# Patient Record
Sex: Female | Born: 1945
Health system: Southern US, Community
[De-identification: ages and names within clinical notes are randomized; demographics above are authoritative.]

## PROBLEM LIST (undated history)

## (undated) DIAGNOSIS — J449 Chronic obstructive pulmonary disease, unspecified: Secondary | ICD-10-CM

## (undated) DIAGNOSIS — E785 Hyperlipidemia, unspecified: Secondary | ICD-10-CM

## (undated) DIAGNOSIS — J439 Emphysema, unspecified: Secondary | ICD-10-CM

## (undated) DIAGNOSIS — J310 Chronic rhinitis: Secondary | ICD-10-CM

## (undated) DIAGNOSIS — J309 Allergic rhinitis, unspecified: Secondary | ICD-10-CM

## (undated) DIAGNOSIS — C801 Malignant (primary) neoplasm, unspecified: Secondary | ICD-10-CM

## (undated) DIAGNOSIS — D126 Benign neoplasm of colon, unspecified: Secondary | ICD-10-CM

## (undated) DIAGNOSIS — M1990 Unspecified osteoarthritis, unspecified site: Secondary | ICD-10-CM

## (undated) DIAGNOSIS — F419 Anxiety disorder, unspecified: Secondary | ICD-10-CM

## (undated) DIAGNOSIS — J9610 Chronic respiratory failure, unspecified whether with hypoxia or hypercapnia: Secondary | ICD-10-CM

## (undated) DIAGNOSIS — J342 Deviated nasal septum: Secondary | ICD-10-CM

## (undated) DIAGNOSIS — F329 Major depressive disorder, single episode, unspecified: Secondary | ICD-10-CM

## (undated) DIAGNOSIS — R0902 Hypoxemia: Secondary | ICD-10-CM

## (undated) DIAGNOSIS — M8580 Other specified disorders of bone density and structure, unspecified site: Secondary | ICD-10-CM

## (undated) DIAGNOSIS — T7840XA Allergy, unspecified, initial encounter: Secondary | ICD-10-CM

## (undated) HISTORY — DX: Hyperlipidemia, unspecified: E78.5

## (undated) HISTORY — DX: Allergic rhinitis, unspecified: J30.9

## (undated) HISTORY — DX: Other specified disorders of bone density and structure, unspecified site: M85.80

## (undated) HISTORY — PX: LIPOMA EXCISION: SHX5283

## (undated) HISTORY — DX: Emphysema, unspecified: J43.9

## (undated) HISTORY — DX: Allergy, unspecified, initial encounter: T78.40XA

## (undated) HISTORY — PX: COLON SURGERY: SHX602

## (undated) HISTORY — PX: COSMETIC SURGERY: SHX468

## (undated) HISTORY — DX: Major depressive disorder, single episode, unspecified: F32.9

## (undated) HISTORY — DX: Deviated nasal septum: J34.2

## (undated) HISTORY — PX: HERNIA REPAIR: SHX51

## (undated) HISTORY — PX: BREAST SURGERY: SHX581

## (undated) HISTORY — DX: Unspecified osteoarthritis, unspecified site: M19.90

## (undated) HISTORY — DX: Benign neoplasm of colon, unspecified: D12.6

## (undated) HISTORY — DX: Chronic respiratory failure, unspecified whether with hypoxia or hypercapnia: J96.10

## (undated) HISTORY — DX: Malignant (primary) neoplasm, unspecified: C80.1

## (undated) HISTORY — PX: GANGLION CYST EXCISION: SHX1691

## (undated) HISTORY — DX: Anxiety disorder, unspecified: F41.9

## (undated) HISTORY — DX: Hypoxemia: R09.02

## (undated) HISTORY — DX: Chronic rhinitis: J31.0

## (undated) SURGERY — VIDEO BRONCHOSCOPY WITHOUT FLUORO
Anesthesia: Moderate Sedation

---
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
## 1976-06-10 HISTORY — PX: BREAST ENHANCEMENT SURGERY: SHX7

## 1985-06-10 HISTORY — PX: NASAL SEPTUM SURGERY: SHX37

## 1999-11-01 ENCOUNTER — Other Ambulatory Visit: Admission: RE | Admit: 1999-11-01 | Discharge: 1999-11-01 | Payer: Self-pay | Admitting: Gastroenterology

## 1999-11-01 ENCOUNTER — Encounter (INDEPENDENT_AMBULATORY_CARE_PROVIDER_SITE_OTHER): Payer: Self-pay | Admitting: Specialist

## 2000-05-19 ENCOUNTER — Emergency Department (HOSPITAL_COMMUNITY): Admission: EM | Admit: 2000-05-19 | Discharge: 2000-05-19 | Payer: Self-pay | Admitting: *Deleted

## 2000-05-19 ENCOUNTER — Encounter: Payer: Self-pay | Admitting: Emergency Medicine

## 2001-09-23 ENCOUNTER — Other Ambulatory Visit: Admission: RE | Admit: 2001-09-23 | Discharge: 2001-09-23 | Payer: Self-pay | Admitting: *Deleted

## 2002-04-22 ENCOUNTER — Encounter: Admission: RE | Admit: 2002-04-22 | Discharge: 2002-04-22 | Payer: Self-pay

## 2002-06-10 HISTORY — PX: OTHER SURGICAL HISTORY: SHX169

## 2002-12-03 ENCOUNTER — Encounter: Admission: RE | Admit: 2002-12-03 | Discharge: 2002-12-03 | Payer: Self-pay

## 2002-12-27 ENCOUNTER — Encounter: Admission: RE | Admit: 2002-12-27 | Discharge: 2002-12-27 | Payer: Self-pay | Admitting: General Surgery

## 2002-12-27 ENCOUNTER — Encounter: Payer: Self-pay | Admitting: General Surgery

## 2002-12-30 ENCOUNTER — Ambulatory Visit (HOSPITAL_BASED_OUTPATIENT_CLINIC_OR_DEPARTMENT_OTHER): Admission: RE | Admit: 2002-12-30 | Discharge: 2002-12-30 | Payer: Self-pay | Admitting: General Surgery

## 2002-12-30 ENCOUNTER — Encounter (INDEPENDENT_AMBULATORY_CARE_PROVIDER_SITE_OTHER): Payer: Self-pay | Admitting: *Deleted

## 2004-05-17 ENCOUNTER — Other Ambulatory Visit: Admission: RE | Admit: 2004-05-17 | Discharge: 2004-05-17 | Payer: Self-pay | Admitting: Family Medicine

## 2004-06-10 DIAGNOSIS — F329 Major depressive disorder, single episode, unspecified: Secondary | ICD-10-CM

## 2004-06-10 DIAGNOSIS — F32A Depression, unspecified: Secondary | ICD-10-CM

## 2004-06-10 HISTORY — DX: Major depressive disorder, single episode, unspecified: F32.9

## 2004-06-10 HISTORY — DX: Depression, unspecified: F32.A

## 2005-05-21 ENCOUNTER — Other Ambulatory Visit: Admission: RE | Admit: 2005-05-21 | Discharge: 2005-05-21 | Payer: Self-pay | Admitting: Family Medicine

## 2006-01-22 ENCOUNTER — Ambulatory Visit: Payer: Self-pay | Admitting: Family Medicine

## 2006-07-09 ENCOUNTER — Ambulatory Visit: Payer: Self-pay | Admitting: Family Medicine

## 2006-07-09 ENCOUNTER — Other Ambulatory Visit: Admission: RE | Admit: 2006-07-09 | Discharge: 2006-07-09 | Payer: Self-pay | Admitting: Family Medicine

## 2006-07-16 ENCOUNTER — Ambulatory Visit: Payer: Self-pay | Admitting: Gastroenterology

## 2006-07-23 ENCOUNTER — Encounter: Admission: RE | Admit: 2006-07-23 | Discharge: 2006-07-23 | Payer: Self-pay | Admitting: Physician Assistant

## 2006-08-19 ENCOUNTER — Ambulatory Visit: Payer: Self-pay | Admitting: Gastroenterology

## 2006-08-19 LAB — HM COLONOSCOPY

## 2007-08-17 ENCOUNTER — Ambulatory Visit: Payer: Self-pay | Admitting: Family Medicine

## 2007-08-17 ENCOUNTER — Other Ambulatory Visit: Admission: RE | Admit: 2007-08-17 | Discharge: 2007-08-17 | Payer: Self-pay | Admitting: Family Medicine

## 2008-04-15 ENCOUNTER — Ambulatory Visit: Payer: Self-pay | Admitting: Family Medicine

## 2008-09-14 ENCOUNTER — Ambulatory Visit: Payer: Self-pay | Admitting: Family Medicine

## 2008-09-23 ENCOUNTER — Ambulatory Visit: Payer: Self-pay | Admitting: Family Medicine

## 2008-10-24 ENCOUNTER — Ambulatory Visit: Payer: Self-pay | Admitting: Family Medicine

## 2008-11-21 ENCOUNTER — Ambulatory Visit: Payer: Self-pay | Admitting: Family Medicine

## 2009-04-06 ENCOUNTER — Ambulatory Visit: Payer: Self-pay | Admitting: Family Medicine

## 2009-05-11 ENCOUNTER — Ambulatory Visit: Payer: Self-pay | Admitting: Family Medicine

## 2009-08-07 ENCOUNTER — Ambulatory Visit: Payer: Self-pay | Admitting: Family Medicine

## 2009-11-30 ENCOUNTER — Other Ambulatory Visit: Admission: RE | Admit: 2009-11-30 | Discharge: 2009-11-30 | Payer: Self-pay | Admitting: Family Medicine

## 2009-11-30 ENCOUNTER — Ambulatory Visit: Payer: Self-pay | Admitting: Family Medicine

## 2010-10-26 NOTE — Op Note (Signed)
Courtney Ayers, Courtney Ayers                        ACCOUNT NO.:  [id number]   MEDICAL RECORD NO.:  [id number]                   PATIENT TYPE:  AMB   LOCATION:  [facility]SC                                  FACILITY:  [facility]   PHYSICIAN:  Jimmye Norman III, M.D.               DATE OF BIRTH:  September 19, 1945   DATE OF PROCEDURE:  12/30/2002  DATE OF DISCHARGE:                                 OPERATIVE REPORT   PREOPERATIVE DIAGNOSES:  1. Lipoma of the back measuring approximately 3 cm in size.  2. Right inguinal hernia, direct with possible femoral extension.   POSTOPERATIVE DIAGNOSES:  1. Lipoma of the back.  2. Right groin lipoma.  3. Right direct hernia without femoral extension.  4. Ganglion cyst of superior pubic rami on the right side.   OPERATION:  1. Excision lipoma of the back.  2. Repair of right inguinal hernia without mesh.  3. Excision of right groin lipoma.  4. Marsupialization and partial excision of cyst wall of superior ramus     cyst.   SURGEON:  Jimmye Norman, M.D.   ASSISTANT:  None   ANESTHESIA:  General endotracheal   ESTIMATED BLOOD LOSS:  Less than 30 cc   COMPLICATIONS:  None.   CONDITION:  Stable.   FINDINGS:  The patient had a 2.5 to 3 cm lipoma of the back just to the  right of the spine in the mid back.  In the right groin, she had a small 2  cm lipoma.  She also had a direct hernia defect which was repaired without  mesh and at the inferior border of the superior ramus on the right side in  the mid portion of the ramus there was a cyst extending downward attached to  the ramus which upon dissection ruptured giving a clear gel reminiscent of a  ganglion cyst and partial wall was sent off.   DESCRIPTION OF PROCEDURE:  The patient was taken to the operating room,  placed on the table in the supine position.  After an adequate endotracheal  anesthetic was administered, she was slipped into the prone position for  excision of the mid back lipoma.  She was  endotracheally intubated and  anesthetized, therefore, we just made sort of a longitudinal slanting  incision across the lipoma and then dissected out the lipoma from the  subcutaneous which was not difficult with Metzenbaum scissors.  Cautery was  used to obtain hemostasis.  The lipoma was not in a submuscular position.  After excision, we reapproximated the skin using running subcuticular stitch  of 4-0 Vicryl.  We did inject 0.25% Marcaine with epinephrine in the  incision subsequent to excising the lipoma and prior to placing the patient  in the prone position for her right groin exploration, right inguinal hernia  repair.   The patient was placed in the prone position and prepped and draped in the  usual sterile  manner exposing the right inguinal area.  The CT scan  preoperatively had demonstrated a mass that extended just below the superior  pubic ramus on the right side into an area just medial to the femoral  vessels.  This was reminiscent of a femoral hernia.  Therefore, we made an  incision paralleling the right inguinal ligament down into the subcutaneous  and then down to the external oblique fascia which was easily visualized.  We split the fascia along its fibers down through the superficial ring.  We  were able to isolate the ligamentum terres with a Penrose drain.  There was  a hernial defect, a direct hernia defect in the canal which we subsequently  repaired; however, we did not see a femoral extension of this hernia.  Therefore, we just stretched the incision in order to get in an  intraligamentous position just below the inguinal ligament on the right side  and palpated the muscle extending from the superior pubic ramus medially and  abductor muscle and split it along it's fiber revealing a cyst attached to  the superior ramus of the symphysis pubis.  It was difficult to dissect out  this from this position circumferentially; however, we were able to get 75%  around  it.  We went medial to the femoral vessels.  Care was taken not to  damage any nerves or vessels in that area as we bluntly dissected out the  cyst.  However, as we dissected it did rupture and we got out the clear gel  reminiscent of a ganglion cyst.  Once it was ruptured, we did excise part of  the wall and sent it as a specimen, confirmed the presence of the cyst and  just left it open for clear drainage.  We packed this area, then  subsequently went ahead and did a Cooper's type repair on the direct hernia,  taking the conjoined tendon and patched it down to the Cooper's ligament  with one transitional stitch at the level of the internal ring.  This was  done with 0 Ethibon suture.  Once this was done, we irrigated both sides.  We did place a relaxing stitch in the fascia of the conjoined fascia.  There  was some bleeding from that which was controlled with a patch of Gelfoam.  We subsequently closed the abductor muscle in the intraligamentous position  using a single stitch of 3-0 Vicryl.  We allowed the ligamentum terres to  fall back into the inguinal canal and then we closed the external oblique  fascia using a running 3-0 Vicryl.  We injected 0.25% Marcaine into the  wound in the subcutaneous area for anesthesia.  Approximately 20 cc were  used in the inguinal area.  Scarpa's fascia was reapproximated using three  interrupted sutures of 3-0 Vicryl.  The skin was closed using a running  subcuticular stitch of 4-0 Vicryl.  All needle counts, sponge counts and  instrument counts were correct.  Sterile dressing was applied.                                               Kathrin Ruddy, M.D.    JW/MEDQ  D:  12/30/2002  T:  12/30/2002  Job:  [id number]

## 2010-10-26 NOTE — Assessment & Plan Note (Signed)
[facility]                         GASTROENTEROLOGY OFFICE NOTE   LYNDELL, GILLYARD                     MRN:          [id number]  DATE:07/16/2006                            DOB:          1945-07-01    Courtney Ayers comes in saying that she had some abdominal discomfort with right  lower quadrant pain intermittently. It occurs almost weekly, but not  every day. She did have a femoral hernia, which was repaired and I  wonder whether it might be related to this, but she did see her primary  care doctors P.A. who suggested may be it was ovarian and suggested a  pelvic ultrasound. She does complain of some gas and bloating at times  and has known irritable bowel syndrome, problems with hemorrhoids and  has had colonoscopic examinations over the years with polyps being  found. The last time in 2004 and she is due some time this year for a  followup.   Her family history reveals that her sister had Crohn's disease and  recently died from a heart attack.   PAST MEDICAL HISTORY:  Reveals only some anxiety and depression and some  allergies as well as her hernia surgery we discussed and some breast  surgery.   SOCIAL HISTORY:  Is really noncontributory except to note that she  smokes a pack and a half of cigarettes a day and has occasional  alcoholic drink.   REVIEW OF SYSTEMS:  Noncontributory.   PHYSICAL EXAMINATION:  She is thin, 5 feet, 9 inches. Weight is 154.  Blood pressure 114/72. Pulse is 80 and regular.  NECK, HEART AND EXTREMITIES: All unremarkable.  ABDOMEN: Is tender on palpation right lower quadrant. This was above  where her hernia would have been so I really suspect it is either  ovarian or pelvic from her GI systems with adhesions and so on as a  possible culprit.   IMPRESSION:  1. Right lower quadrant abdominal pain of questionable etiology, rule      out pelvic-ovarian versus gastrointestinal in a patient that has      had recurrent  colon polyps.  2. Anxiety/depression.  3. Allergies.  4. History of femoral hernia surgery.   RECOMMENDATIONS:  Is that we get an ultrasound of her pelvis as ordered  by her primary care doctor. If this is negative, I would do a CT scan  for completeness and we are going to go  ahead and schedule her for followup colonoscopic examination some time  in the very near future.     Ulyess Mort, MD  Electronically Signed    SML/MedQ  DD: 07/16/2006  DT: 07/16/2006  Job #: [id number]

## 2010-11-21 ENCOUNTER — Telehealth: Payer: Self-pay | Admitting: Family Medicine

## 2010-11-21 MED ORDER — CONJ ESTROG-MEDROXYPROGEST ACE 0.625-2.5 MG PO TABS: 1.0000 | ORAL_TABLET | Freq: Every day | ORAL | Status: DC

## 2010-11-21 NOTE — Telephone Encounter (Signed)
Pt scheduled physical 01/30/2011 with Dr. Lynelle Doctor  Needs refill on Prempro .625

## 2011-01-28 ENCOUNTER — Encounter: Payer: Self-pay | Admitting: Family Medicine

## 2011-01-30 ENCOUNTER — Encounter: Payer: Self-pay | Admitting: Family Medicine

## 2011-01-30 ENCOUNTER — Ambulatory Visit (INDEPENDENT_AMBULATORY_CARE_PROVIDER_SITE_OTHER): Payer: Medicare Other | Admitting: Family Medicine

## 2011-01-30 VITALS — BP 128/80 | HR 68 | Ht 68.0 in | Wt 144.0 lb

## 2011-01-30 DIAGNOSIS — J309 Allergic rhinitis, unspecified: Secondary | ICD-10-CM

## 2011-01-30 DIAGNOSIS — Z23 Encounter for immunization: Secondary | ICD-10-CM

## 2011-01-30 DIAGNOSIS — M8580 Other specified disorders of bone density and structure, unspecified site: Secondary | ICD-10-CM | POA: Insufficient documentation

## 2011-01-30 DIAGNOSIS — F17200 Nicotine dependence, unspecified, uncomplicated: Secondary | ICD-10-CM

## 2011-01-30 DIAGNOSIS — F419 Anxiety disorder, unspecified: Secondary | ICD-10-CM | POA: Insufficient documentation

## 2011-01-30 DIAGNOSIS — M899 Disorder of bone, unspecified: Secondary | ICD-10-CM

## 2011-01-30 DIAGNOSIS — F341 Dysthymic disorder: Secondary | ICD-10-CM | POA: Insufficient documentation

## 2011-01-30 DIAGNOSIS — Z0000 Encounter for general adult medical examination without abnormal findings: Secondary | ICD-10-CM

## 2011-01-30 DIAGNOSIS — F3289 Other specified depressive episodes: Secondary | ICD-10-CM

## 2011-01-30 DIAGNOSIS — F329 Major depressive disorder, single episode, unspecified: Secondary | ICD-10-CM

## 2011-01-30 DIAGNOSIS — F411 Generalized anxiety disorder: Secondary | ICD-10-CM

## 2011-01-30 LAB — POCT URINALYSIS DIPSTICK
Bilirubin, UA: NEGATIVE
Leukocytes, UA: NEGATIVE
Nitrite, UA: NEGATIVE
pH, UA: 7

## 2011-01-30 MED ORDER — FLUTICASONE PROPIONATE 50 MCG/ACT NA SUSP: 1.0000 | Freq: Every day | NASAL | Status: DC

## 2011-01-30 MED ORDER — CITALOPRAM HYDROBROMIDE 10 MG PO TABS: 10.0000 mg | ORAL_TABLET | Freq: Every day | ORAL | Status: DC

## 2011-01-30 NOTE — Progress Notes (Addendum)
Courtney Ayers is a 65 y.o. female who presents for a complete physical.  She has the following concerns: Decreased libido.  Not currently in a sexual relationship.  Decreased libido for about 6 months.   Pain in both hands, fingers.  Taking glucosamine and chondroitin for a couple of months, and has helped some.  R middle finger stays achey Seasonal allergies.  Using breatheright strips.  Complaining now of nasal stuffiness.  Also admits to being addicted to nose sprays in the past, not using currently.  Some short-acting antihistamines and Neti-pot has been helpful  Immunization History  Administered Date(s) Administered  . Influenza Whole 05/16/2004  . PPD Test 05/30/1999  . Pneumococcal Polysaccharide 08/06/2000  . Td 09/23/2001   Last Pap smear: 11/2009 Last mammogram: 10/2010 Last colonoscopy: 07/2006 Last DEXA: Yolanda Bonine 01/2007 Exercises 3x/week Dentist: 2x/year Ophtho: every 2-3 years  Past Medical History  Diagnosis Date  . Allergic rhinitis, cause unspecified     seasonal  . Osteopenia   . OA (osteoarthritis)     B THUMB, LEFT HIP  . Adenomatous colon polyp   . Depressive disorder, not elsewhere classified 2006    some anxiety recurs when she has stopped med in past    Past Surgical History  Procedure Date  . Tonsillectomy and adenoidectomy 1951  . Nasal septum surgery 1987    for deviated septum (Dr. Dub Amis)  . Breast enhancement surgery 1978    implants placed '78, removed and replaced  approx '95 (Dr. Dub Amis)  . Cosmetic surgery     LIPOSUCTION; other facial cosmetic surgery  . Inguinial hernia repair 2004    right, and ganglion cyst removal    History   Social History  . Marital Status: Single    Spouse Name: N/A    Number of Children: N/A  . Years of Education: N/A   Occupational History  . Not on file.   Social History Main Topics  . Smoking status: Current Everyday Smoker -- 1.0 packs/day  . Smokeless tobacco: Not on file  . Alcohol Use:  Yes     1 drink per evening.  . Drug Use: No  . Sexually Active: Not Currently   Other Topics Concern  . Not on file   Social History Narrative  . No narrative on file    Family History  Problem Relation Age of Onset  . Cancer Mother     bladder, metastatic  . Alcohol abuse Father   . Heart disease Father     CHF  . Hyperlipidemia Father   . Hypertension Father   . Aortic aneurysm Sister   . Crohn's disease Sister   . Diabetes Maternal Uncle   . Cancer Maternal Grandmother 98    pancreatic  . Cancer Maternal Grandfather     bladder cancer in 19's  . Stroke Paternal Grandmother   . Heart disease Paternal Grandfather     MI later 75's, early 64's  . Hyperlipidemia Sister   . Cancer Sister     brain cancer in her 48's   Current outpatient prescriptions:b complex vitamins tablet, Take 1 tablet by mouth daily.  , Disp: , Rfl: ;  calcium-vitamin D (CALCIUM 500+D HIGH POTENCY) 500-400 MG-UNIT per tablet, Take 1 tablet by mouth daily.  , Disp: , Rfl: ;  citalopram (CELEXA) 10 MG tablet, Take 1 tablet (10 mg total) by mouth daily., Disp: 30 tablet, Rfl: 11 estrogen, conjugated,-medroxyprogesterone (PREMPRO) 0.625-2.5 MG per tablet, Take 1 tablet by mouth daily., Disp:  30 tablet, Rfl: 11;  glucosamine-chondroitin 500-400 MG tablet, Take 1 tablet by mouth 3 (three) times daily.  , Disp: , Rfl: ;  Multiple Vitamins-Minerals (MULTIVITAMIN WITH MINERALS) tablet, Take 1 tablet by mouth daily.  , Disp: , Rfl: ;  vitamin B-12 (CYANOCOBALAMIN) 100 MCG tablet, Take 50 mcg by mouth daily.  , Disp: , Rfl:  vitamin C (ASCORBIC ACID) 500 MG tablet, Take 1,000 mg by mouth daily.  , Disp: , Rfl: ;  fluticasone (FLONASE) 50 MCG/ACT nasal spray, Place 1 spray into the nose daily., Disp: 16 g, Rfl: 5  No Known Allergies  ROS: The patient denies anorexia, fever, weight changes, headaches,  vision changes, decreased hearing, ear pain, sore throat, breast concerns, chest pain, palpitations, dizziness,  syncope, dyspnea on exertion, cough, swelling, nausea, vomiting, diarrhea, constipation, abdominal pain, melena, hematochezia, indigestion/heartburn, hematuria, incontinence, dysuria, postmenopausal bleeding, vaginal discharge, odor or itch or dryness, genital lesions, numbness, tingling, weakness, tremor, suspicious skin lesions, abnormal bleeding/bruising, or enlarged lymph nodes.  +joint pains (thumbs mainly), +anxiety when cut back on Celexa; denies depression  PHYSICAL EXAM: BP 128/80  Pulse 68  Ht 5\' 8"  (1.727 m)  Wt 144 lb (65.318 kg)  BMI 21.90 kg/m2  LMP 06/10/1992  General Appearance:    Alert, cooperative, no distress, appears stated age  Head:    Normocephalic, without obvious abnormality, atraumatic  Eyes:    PERRL, conjunctiva/corneas clear, EOM's intact, fundi    benign  Ears:    Normal TM's and external ear canals  Nose:   Nares normal, mucosa mildly edematous, clear mucus, no sinus   tenderness  Throat:   Lips, mucosa, and tongue normal; teeth and gums normal  Neck:   Supple, no lymphadenopathy;  thyroid:  no   enlargement/tenderness/nodules; no carotid   bruit or JVD  Back:    Spine nontender, no curvature, ROM normal, no CVA     tenderness  Lungs:     Clear to auscultation bilaterally without wheezes, rales or     ronchi; respirations unlabored  Chest Wall:    No tenderness or deformity   Heart:    Regular rate and rhythm, S1 and S2 normal, no murmur, rub   or gallop  Breast Exam:    No tenderness, masses, or nipple discharge or inversion.      No axillary lymphadenopathy. Firm implants bilaterally  Abdomen:     Soft, non-tender, nondistended, normoactive bowel sounds,    no masses, no hepatosplenomegaly  Genitalia:    Normal external genitalia without lesions.  BUS and vagina normal; No speculum exam or pap smear performed.  Bimanual exam was normal--no uterine or adnexal masses or tenderness  Rectal:    Normal tone, no masses or tenderness;no stool in vault for  heme-testing  Extremities:   No clubbing, cyanosis or edema  Pulses:   2+ and symmetric all extremities  Skin:   Skin color, texture, turgor normal, no rashes or lesions  Lymph nodes:   Cervical, supraclavicular, and axillary nodes normal  Neurologic:   CNII-XII intact, normal strength, sensation and gait; reflexes 2+ and symmetric throughout          Psych:   Normal mood, affect, hygiene and grooming.    ASSESSMENT/PLAN: 1. Routine general medical examination at a health care facility  POCT Urinalysis Dipstick, Visual acuity screening  2. Need for Tdap vaccination  Tdap vaccine greater than or equal to 7yo IM  3. Need for pneumococcal vaccination  Pneumococcal polysaccharide vaccine 23-valent greater  than or equal to 2yo subcutaneous/IM  4. Allergic rhinitis, cause unspecified  fluticasone (FLONASE) 50 MCG/ACT nasal spray  5. Depressive disorder, not elsewhere classified  citalopram (CELEXA) 10 MG tablet  6. Anxiety state, unspecified  citalopram (CELEXA) 10 MG tablet  7. Osteopenia  Vitamin D 25 hydroxy  8. Tobacco use disorder      Discussed monthly self breast exams and yearly mammograms; at least 30 minutes of aerobic activity at least 5 days/week; proper sunscreen use reviewed; healthy diet, including goals of calcium and vitamin D intake and alcohol recommendations (less than or equal to 1 drink/day) reviewed; regular seatbelt use; changing batteries in smoke detectors.  Immunization recommendations discussed--Tdap and pneumovax given today.  Given Rx to get Zostavax at pharmacy.  Colonoscopy recommendations reviewed--Colonoscopy due 08/2011  Decreased libido--not likely related to SSRI given only recent onset of symptoms, and has been on SSRI for 6 years.  May be postmenopausal/hormonal--Discussed possibly changing to Estratest (HS) with separate progesterone, if needed. Discussed psych factors that can effect libido  OA--recommend trial of tylenol Allergies--recommended long-acting  antihistamine (ie Claritin, Zyrtec, Allegra).  Re-start Flonase Osteopenia--schedule DEXA at [facility] Hemoccult cards given Risks of HRT discussed at length--recommendations are to use lowest effective dose and to try and taper off entirely.  Discussed risks vs benefits (and understands risks if she chooses to continue)

## 2011-01-30 NOTE — Patient Instructions (Signed)
HEALTH MAINTENANCE RECOMMENDATIONS:  PLEASE QUIT SMOKING!!  It is recommended that you get at least 30 minutes of aerobic exercise at least 5 days/week (for weight loss, you may need as much as 60-90 minutes). This can be any activity that gets your heart rate up. This can be divided in 10-15 minute intervals if needed, but try and build up your endurance at least once a week.  Weight bearing exercise is also recommended twice weekly.  Eat a healthy diet with lots of vegetables, fruits and fiber.  "Colorful" foods have a lot of vitamins (ie green vegetables, tomatoes, red peppers, etc).  Limit sweet tea, regular sodas and alcoholic beverages, all of which has a lot of calories and sugar.  Up to 1 alcoholic drink daily may be beneficial for women (unless trying to lose weight, watch sugars).  Drink a lot of water.  Calcium recommendations are 1200-1500 mg daily (1500 mg for postmenopausal women or women without ovaries), and vitamin D 1000 IU daily.  This should be obtained from diet and/or supplements (vitamins), and calcium should not be taken all at once, but in divided doses.  Monthly self breast exams and yearly mammograms for women over the age of 91 is recommended.  Sunscreen of at least SPF 30 should be used on all sun-exposed parts of the skin when outside between the hours of 10 am and 4 pm (not just when at beach or pool, but even with exercise, golf, tennis, and yard work!)  Use a sunscreen that says "broad spectrum" so it covers both UVA and UVB rays, and make sure to reapply every 1-2 hours.  Remember to change the batteries in your smoke detectors when changing your clock times in the spring and fall.  Use your seat belt every time you are in a car, and please drive safely and not be distracted with cell phones and texting while driving.  Colonoscopy is due 08/2011 Please consider Zostavax--shingles vaccine.  Prescription was given to get through pharmacy

## 2011-01-31 ENCOUNTER — Encounter: Payer: Self-pay | Admitting: Family Medicine

## 2011-02-18 ENCOUNTER — Telehealth: Payer: Self-pay | Admitting: *Deleted

## 2011-02-18 NOTE — Telephone Encounter (Signed)
Called patient and gave her dexa results, advised patient of osteopenia, no significant change from 2008. Continue 1200mg  Ca2+ and vitamin D daily as well as exercise. Also mentioned quitting smoking to patient. Patient verbalized understanding.

## 2011-06-11 HISTORY — PX: MELANOMA EXCISION: SHX5266

## 2011-07-03 ENCOUNTER — Ambulatory Visit (INDEPENDENT_AMBULATORY_CARE_PROVIDER_SITE_OTHER): Payer: Medicare Other | Admitting: Family Medicine

## 2011-07-03 ENCOUNTER — Encounter: Payer: Self-pay | Admitting: Family Medicine

## 2011-07-03 DIAGNOSIS — R109 Unspecified abdominal pain: Secondary | ICD-10-CM

## 2011-07-03 DIAGNOSIS — Z23 Encounter for immunization: Secondary | ICD-10-CM

## 2011-07-03 LAB — POCT URINALYSIS DIPSTICK
Blood, UA: NEGATIVE
Glucose, UA: NEGATIVE
Spec Grav, UA: 1.025
Urobilinogen, UA: NEGATIVE

## 2011-07-03 NOTE — Patient Instructions (Signed)
Right sided pelvic pain--exam was otherwise normal (normal urine, no mass), but area of pain was at right adnexa (ovary area).  It is possible this is musculoskeletal, and not related to the ovary.  Trial of Ibuprofen or Aleve and warm compresses x 1 week. If symptoms persist, call and pelvic u/s will be arranged, given Right adnexal tenderness (but no mass) on exam  Quitting smoking is strongly encouraged.  To ER if severe abdominal/pelvic pain, high fever, vomiting, blood in stool, or other significant changes occur

## 2011-07-03 NOTE — Progress Notes (Signed)
Chief complaint: for the past 7 days has been having pubic/lower abdominal pain/pressure. Mother had bladder cancer, in the past had inguinal hernia repair in 2004, also had uterine fibroids that were seen on ultrasound  HPI:  Describes the lower abdominal pain as an "ache", pressure.  She is aware of it intermittently.  Denies burning or discomfort with voiding.  Denies vaginal discharge, odor, or itch.  Denies any prolapse. She has a history of fibroids, asymptomatic.  She has a h/o R inguinal hernia repair, along with ganglion cyst removal at R groin. Mother had h/o bladder cancer (and was a nonsmoker).  Denies constipation, and actually has had some diarrhea intermittently.  Has had some weight loss, "not been eating very much", but "not intentional".  Per chart, 3 pounds less than her last visit in August.   Exercises regularly, including core/abs.   Past Medical History  Diagnosis Date  . Allergic rhinitis, cause unspecified     seasonal  . Osteopenia   . OA (osteoarthritis)     B THUMB, LEFT HIP  . Adenomatous colon polyp   . Depressive disorder, not elsewhere classified 2006    some anxiety recurs when she has stopped med in past    Past Surgical History  Procedure Date  . Tonsillectomy and adenoidectomy 1951  . Nasal septum surgery 1987    for deviated septum (Dr. Dub Amis)  . Breast enhancement surgery 1978    implants placed '78, removed and replaced  approx '95 (Dr. Dub Amis)  . Cosmetic surgery     LIPOSUCTION; other facial cosmetic surgery  . Inguinial hernia repair 2004    right, and ganglion cyst removal    History   Social History  . Marital Status: Single    Spouse Name: N/A    Number of Children: N/A  . Years of Education: N/A   Occupational History  . Not on file.   Social History Main Topics  . Smoking status: Current Everyday Smoker -- 1.0 packs/day  . Smokeless tobacco: Not on file  . Alcohol Use: Yes     1-2 drink per evening.  . Drug Use: No  .  Sexually Active: Not Currently   Other Topics Concern  . Not on file   Social History Narrative  . No narrative on file    Family History  Problem Relation Age of Onset  . Cancer Mother     bladder, metastatic  . Alcohol abuse Father   . Heart disease Father     CHF  . Hyperlipidemia Father   . Hypertension Father   . Aortic aneurysm Sister   . Crohn's disease Sister   . Diabetes Maternal Uncle   . Cancer Maternal Grandmother 98    pancreatic  . Cancer Maternal Grandfather     bladder cancer in 59's  . Stroke Paternal Grandmother   . Heart disease Paternal Grandfather     MI later 2's, early 70's  . Hyperlipidemia Sister   . Cancer Sister     brain cancer in her 46's   Current Outpatient Prescriptions on File Prior to Visit  Medication Sig Dispense Refill  . citalopram (CELEXA) 10 MG tablet Take 1 tablet (10 mg total) by mouth daily.  30 tablet  11  . estrogen, conjugated,-medroxyprogesterone (PREMPRO) 0.625-2.5 MG per tablet Take 1 tablet by mouth daily.  30 tablet  11  . fluticasone (FLONASE) 50 MCG/ACT nasal spray Place 1 spray into the nose daily.  16 g  5  .  vitamin B-12 (CYANOCOBALAMIN) 100 MCG tablet Take 100 mcg by mouth daily.       . vitamin C (ASCORBIC ACID) 500 MG tablet Take 1,000 mg by mouth daily.        [name] b complex vitamins tablet Take 1 tablet by mouth daily.          No Known Allergies  ROS:  Denies fevers. Recent URI with slight lingering cough. No chest pain, dizziness.  See HPI  PHYSICAL EXAM: BP 122/80  Pulse 76  Ht 5' 7.25" (1.708 m)  Wt 141 lb (63.957 kg)  BMI 21.92 kg/m2  LMP 06/10/1992 Well developed, pleasant, thin female in no distress Neck: no lymphadenopathy Heart: regular rate and rhythm Lungs: clear bilaterally Abdomen: soft, normal bowel sounds. nontender on exam, although area of discomfort is suprapubic. Bimanual exam--no cervical motion tenderness, nontender on palpation of bladder.  No prolapse.  No adnexal masses, but  was tender at R adnexa on exam Extremities: no edema Psych: normal mood   Urine dip normal  ASSESSMENT/PLAN:  1. Abdominal pain  POCT Urinalysis Dipstick   suprapubic, R adnexal tenderness on exam  2. Need for prophylactic vaccination and inoculation against influenza  Flu vaccine greater than or equal to 3yo preservative free IM    Ibuprofen and warm compresses x 1 week. If symptoms persist, she will call and pelvic u/s will be arranged, given R adnexal tenderness (but no mass) on exam. Advised that urine didn't suggest any infection, no microscopic hematuria.  Discussed increased risk of bladder cancer (as well as other risks) from smoking.  Strongly encouraged smoking cessation

## 2011-07-12 ENCOUNTER — Encounter: Payer: Self-pay | Admitting: Internal Medicine

## 2011-07-22 ENCOUNTER — Encounter: Payer: Self-pay | Admitting: Internal Medicine

## 2011-07-24 ENCOUNTER — Telehealth: Payer: Self-pay | Admitting: Family Medicine

## 2011-07-24 NOTE — Telephone Encounter (Signed)
DONE

## 2011-08-09 HISTORY — PX: COLONOSCOPY: SHX174

## 2011-08-15 ENCOUNTER — Ambulatory Visit (AMBULATORY_SURGERY_CENTER): Payer: Medicare Other | Admitting: *Deleted

## 2011-08-15 VITALS — Ht 68.0 in | Wt 137.0 lb

## 2011-08-15 DIAGNOSIS — Z1211 Encounter for screening for malignant neoplasm of colon: Secondary | ICD-10-CM

## 2011-08-15 MED ORDER — PEG-KCL-NACL-NASULF-NA ASC-C 100 G PO SOLR: ORAL | Status: DC

## 2011-08-29 ENCOUNTER — Encounter: Payer: Self-pay | Admitting: Internal Medicine

## 2011-08-29 ENCOUNTER — Ambulatory Visit (AMBULATORY_SURGERY_CENTER): Payer: Medicare Other | Admitting: Internal Medicine

## 2011-08-29 VITALS — BP 139/74 | HR 66 | Temp 95.2°F | Resp 16 | Ht 68.0 in | Wt 137.0 lb

## 2011-08-29 DIAGNOSIS — K648 Other hemorrhoids: Secondary | ICD-10-CM

## 2011-08-29 DIAGNOSIS — D126 Benign neoplasm of colon, unspecified: Secondary | ICD-10-CM | POA: Diagnosis not present

## 2011-08-29 DIAGNOSIS — Z1211 Encounter for screening for malignant neoplasm of colon: Secondary | ICD-10-CM

## 2011-08-29 DIAGNOSIS — Z860100 Personal history of colon polyps, unspecified: Secondary | ICD-10-CM

## 2011-08-29 DIAGNOSIS — K5730 Diverticulosis of large intestine without perforation or abscess without bleeding: Secondary | ICD-10-CM

## 2011-08-29 MED ORDER — SODIUM CHLORIDE 0.9 % IV SOLN: 500.0000 mL | INTRAVENOUS | Status: DC

## 2011-08-29 NOTE — Progress Notes (Signed)
PATIENT RESTLESS, IV INFILTRATED WITH F=BLEEDING AROUND SITE. IV DC WITH PRESSURE HELD TO SITE X 8 MINS. HOT PACK APPLIED. 22ANGIOCATH INSERTED TO LT HAND.

## 2011-08-29 NOTE — Patient Instructions (Signed)

## 2011-08-29 NOTE — Progress Notes (Signed)
IV (second site) d/c at 1310.  Site clear.  Catheter intact at removal.

## 2011-08-29 NOTE — Op Note (Signed)
[facility] [address] [place], [place]  [address]  COLONOSCOPY PROCEDURE REPORT  PATIENT:  Courtney Ayers, Courtney Ayers  MR#:  [id number] BIRTHDATE:  Nov 23, 1945, 65 yrs. old  GENDER:  female ENDOSCOPIST:  Iva Boop, MD, [facility]  PROCEDURE DATE:  08/29/2011 PROCEDURE:  Colonoscopy with biopsy ASA CLASS:  Class I INDICATIONS:  surveillance and high-risk screening, history of polyps 9 mm serrated polyp (hyperplastic) removed right colon 2009  others have always been diminutive hyperplastic rectosigmoid polyps MEDICATIONS:   These medications were titrated to patient response per physician's verbal order, Fentanyl 75 mcg, Versed 8 mg IV  DESCRIPTION OF PROCEDURE:   After the risks benefits and alternatives of the procedure were thoroughly explained, informed consent was obtained.  Digital rectal exam was performed and revealed no abnormalities.   The LB CF-[id number] endoscope was introduced through the anus and advanced to the cecum, which was identified by both the appendix and ileocecal valve, without limitations.  The quality of the prep was excellent, using MoviPrep.  The instrument was then slowly withdrawn as the colon was fully examined. <<PROCEDUREIMAGES>>  FINDINGS:  A diminutive polyp was found in the ascending colon. It was 5 mm in size. The polyp was removed using cold biopsy forceps. Mild diverticulosis was found in the sigmoid colon.  This was otherwise a normal examination of the colon.   Retroflexed views in the rectum revealed internal hemorrhoids.    The time to cecum = 6:05 (mid abdominal pressure used) minutes. The scope was then withdrawn in 14:30 minutes from the cecum and the procedure completed. COMPLICATIONS:  None ENDOSCOPIC IMPRESSION: 1) 5 mm diminutive polyp in the ascending colon - removed 2) Mild diverticulosis in the sigmoid colon 3) Internal hemorrhoids 4) Otherwise normal examination , excellent prep  REPEAT EXAM:  In for  Colonoscopy, pending biopsy results.  Iva Boop, MD, [facility]  CC:  Joselyn Arrow, MD and The Patient  n. eSIGNED:   Iva Boop at 08/29/2011 12:34 PM  Darrold Span, [phone]

## 2011-08-29 NOTE — Progress Notes (Signed)
Patient did not experience any of the following events: a burn prior to discharge; a fall within the facility; wrong site/side/patient/procedure/implant event; or a hospital transfer or hospital admission upon discharge from the facility. (G8907) Patient did not have preoperative order for IV antibiotic SSI prophylaxis. (G8918)  

## 2011-08-30 ENCOUNTER — Telehealth: Payer: Self-pay | Admitting: *Deleted

## 2011-08-30 NOTE — Telephone Encounter (Signed)
  Follow up Call-  Call back number 08/29/2011  Post procedure Call Back phone  # [phone]  Permission to leave phone message Yes     Patient questions:  Do you have a fever, pain , or abdominal swelling? yes Pain Score  1 *  Have you tolerated food without any problems? yes  Have you been able to return to your normal activities? yes  Do you have any questions about your discharge instructions: Diet   no Medications  no Follow up visit  no  Do you have questions or concerns about your Care? yes  Actions: * If pain score is 4 or above: No action needed, pain <4.  Pt states that when she went to sleep her IV was in her right arm and when she woke up her right arm had a bandage and there was a new IV in her left hand. Explained to pt that her right arm IV infiltrated and that the new IV was restarted so that the procedure and sedation could continue. Pt states that she understands. Pt states that her right arm is still sore but "nothing like it was yesterday." Instructed pt to apply heat to the site to help with the soreness. Instructed pt to call back if pain worsens, or if right arm becomes swollen and red at site. Pt verbalized understanding.

## 2011-09-04 ENCOUNTER — Encounter: Payer: Self-pay | Admitting: Internal Medicine

## 2011-09-04 NOTE — Progress Notes (Signed)
Quick Note:  Diminutive adenoma Repeat colon about 08/2016 ______

## 2011-10-21 DIAGNOSIS — L821 Other seborrheic keratosis: Secondary | ICD-10-CM | POA: Diagnosis not present

## 2011-10-21 DIAGNOSIS — C4360 Malignant melanoma of unspecified upper limb, including shoulder: Secondary | ICD-10-CM | POA: Diagnosis not present

## 2011-10-21 DIAGNOSIS — D485 Neoplasm of uncertain behavior of skin: Secondary | ICD-10-CM | POA: Diagnosis not present

## 2011-10-21 DIAGNOSIS — Z85828 Personal history of other malignant neoplasm of skin: Secondary | ICD-10-CM | POA: Diagnosis not present

## 2011-10-21 DIAGNOSIS — L719 Rosacea, unspecified: Secondary | ICD-10-CM | POA: Diagnosis not present

## 2011-10-21 DIAGNOSIS — L819 Disorder of pigmentation, unspecified: Secondary | ICD-10-CM | POA: Diagnosis not present

## 2011-10-23 ENCOUNTER — Encounter: Payer: Self-pay | Admitting: Dermatology

## 2011-10-23 DIAGNOSIS — Z1231 Encounter for screening mammogram for malignant neoplasm of breast: Secondary | ICD-10-CM | POA: Diagnosis not present

## 2011-10-23 LAB — HM MAMMOGRAPHY: HM Mammogram: NEGATIVE

## 2011-10-24 ENCOUNTER — Encounter: Payer: Self-pay | Admitting: Internal Medicine

## 2011-11-13 DIAGNOSIS — D485 Neoplasm of uncertain behavior of skin: Secondary | ICD-10-CM | POA: Diagnosis not present

## 2011-11-13 DIAGNOSIS — D2330 Other benign neoplasm of skin of unspecified part of face: Secondary | ICD-10-CM | POA: Diagnosis not present

## 2011-11-13 DIAGNOSIS — C4360 Malignant melanoma of unspecified upper limb, including shoulder: Secondary | ICD-10-CM | POA: Diagnosis not present

## 2011-12-11 ENCOUNTER — Telehealth: Payer: Self-pay | Admitting: Family Medicine

## 2011-12-11 DIAGNOSIS — Z79890 Hormone replacement therapy (postmenopausal): Secondary | ICD-10-CM

## 2011-12-11 MED ORDER — CONJ ESTROG-MEDROXYPROGEST ACE 0.625-2.5 MG PO TABS: 1.0000 | ORAL_TABLET | Freq: Every day | ORAL | Status: DC

## 2011-12-11 NOTE — Telephone Encounter (Signed)
Prempro refilled x 2 which will last her until Sept 2013. Left message for her stating that her last CPE was Aug 22,2012 and I need her to give the office a call and scheduled CPE for this year sometime after 01/30/12.

## 2011-12-11 NOTE — Telephone Encounter (Signed)
She is due for CPE in August, and none scheduled.  Okay to refill only until CPE (likely to be scheduled in September--please schedule)

## 2012-02-04 ENCOUNTER — Other Ambulatory Visit: Payer: Self-pay | Admitting: Family Medicine

## 2012-02-04 DIAGNOSIS — F329 Major depressive disorder, single episode, unspecified: Secondary | ICD-10-CM

## 2012-02-04 NOTE — Telephone Encounter (Signed)
Done x 30 d.  Has appt next week

## 2012-02-04 NOTE — Telephone Encounter (Signed)
Rx refill for Celexa.

## 2012-02-13 ENCOUNTER — Encounter: Payer: Medicare Other | Admitting: Family Medicine

## 2012-02-14 ENCOUNTER — Encounter: Payer: Self-pay | Admitting: Family Medicine

## 2012-02-14 ENCOUNTER — Ambulatory Visit (INDEPENDENT_AMBULATORY_CARE_PROVIDER_SITE_OTHER): Payer: Medicare Other | Admitting: Family Medicine

## 2012-02-14 ENCOUNTER — Other Ambulatory Visit (HOSPITAL_COMMUNITY)
Admission: RE | Admit: 2012-02-14 | Discharge: 2012-02-14 | Disposition: A | Discharge status: Home / Self Care | Payer: Medicare Other | Source: Ambulatory Visit | Attending: Family Medicine | Admitting: Family Medicine

## 2012-02-14 VITALS — BP 112/72 | HR 64 | Ht 68.0 in | Wt 133.0 lb

## 2012-02-14 DIAGNOSIS — R5381 Other malaise: Secondary | ICD-10-CM

## 2012-02-14 DIAGNOSIS — Z131 Encounter for screening for diabetes mellitus: Secondary | ICD-10-CM

## 2012-02-14 DIAGNOSIS — F411 Generalized anxiety disorder: Secondary | ICD-10-CM

## 2012-02-14 DIAGNOSIS — Z0000 Encounter for general adult medical examination without abnormal findings: Secondary | ICD-10-CM

## 2012-02-14 DIAGNOSIS — D7589 Other specified diseases of blood and blood-forming organs: Secondary | ICD-10-CM

## 2012-02-14 DIAGNOSIS — F329 Major depressive disorder, single episode, unspecified: Secondary | ICD-10-CM

## 2012-02-14 DIAGNOSIS — F17200 Nicotine dependence, unspecified, uncomplicated: Secondary | ICD-10-CM

## 2012-02-14 DIAGNOSIS — R5383 Other fatigue: Secondary | ICD-10-CM

## 2012-02-14 DIAGNOSIS — Z01419 Encounter for gynecological examination (general) (routine) without abnormal findings: Secondary | ICD-10-CM | POA: Diagnosis not present

## 2012-02-14 DIAGNOSIS — Z23 Encounter for immunization: Secondary | ICD-10-CM | POA: Diagnosis not present

## 2012-02-14 DIAGNOSIS — J309 Allergic rhinitis, unspecified: Secondary | ICD-10-CM | POA: Diagnosis not present

## 2012-02-14 LAB — CBC WITH DIFFERENTIAL/PLATELET
Basophils Absolute: 0 10*3/uL (ref 0.0–0.1)
Eosinophils Absolute: 0 10*3/uL (ref 0.0–0.7)
Eosinophils Relative: 0 % (ref 0–5)
HCT: 43 % (ref 36.0–46.0)
Lymphs Abs: 2.2 10*3/uL (ref 0.7–4.0)
MCH: 33.4 pg (ref 26.0–34.0)
MCV: 97.1 fL (ref 78.0–100.0)
Monocytes Absolute: 0.5 10*3/uL (ref 0.1–1.0)
Platelets: 234 10*3/uL (ref 150–400)
RDW: 13.9 % (ref 11.5–15.5)

## 2012-02-14 MED ORDER — FLUTICASONE PROPIONATE 50 MCG/ACT NA SUSP: 2.0000 | Freq: Every day | NASAL | Status: DC

## 2012-02-14 MED ORDER — CITALOPRAM HYDROBROMIDE 10 MG PO TABS: 10.0000 mg | ORAL_TABLET | Freq: Every day | ORAL | Status: DC

## 2012-02-14 NOTE — Progress Notes (Signed)
Chief Complaint  Patient presents with  . med check plus    patient was given rx by Dr.Gruber for adult acne-unsure of name.   Patient presents for follow up of chronic medical problems (med check) and Annual Wellness Visit  Other physicians involved in her care: GI--Dr. Leone Payor Derm--Dr. Danella Deis ophtho--Dr. Emily Filbert Dentist: Dr. Brendia Sacks  Depression:  Originally started on citalopram for grief. She felt fine on it for several years, so at one point went off the medication (over a year ago).  Had symptoms of anxiety and poor sleep, as well as increased claustrophobia while off medication.  She has restarted and is doing well.  See written depression screen (scanned)  ADL's-- no concerns, see pt questionnaire. Denies falls--slipped on a wet porch, but was able to catch herself.  End of life issues--has power of attorney set up  HRT--has been off Prempro since May or June, and doing well.  Apparently it was stopped due to an issue getting a refill from pharmacy.  It had been planned to try and taper off anyway (as we had previously discussed risks/benefits), and she has been doing okay since off HRT.  Feels warm easily, but no night sweats, mood changes, vaginal dryness or other concerns.  Seasonal allergies are starting to flare--some stuffy nose and eye irritation.  Hasn't started back on Flonase yet.  Health Maintenance: Immunization History  Administered Date(s) Administered  . Influenza Split 07/03/2011, 02/14/2012  . Influenza Whole 05/16/2004  . PPD Test 05/30/1999  . Pneumococcal Polysaccharide 08/06/2000, 01/30/2011  . Td 09/23/2001  . Tdap 01/30/2011  . Zoster 03/05/2011   Last Pap smear: 11/2009 Last mammogram: 10/2011 Last colonoscopy: 08/2011 with Dr. Leone Payor Last DEXA: 01/2011 Dentist: last week, goes twice yearly Ophtho: every 2-3 years, due, plans to schedule Exercise: exercises 3x/week at gym, and working around house. Last lipids 11/2009, prev done yearly and all  okay.  Past Medical History  Diagnosis Date  . Allergic rhinitis, cause unspecified     seasonal  . Osteopenia   . OA (osteoarthritis)     B THUMB, LEFT HIP  . Adenomatous colon polyp   . Depressive disorder, not elsewhere classified 2006    some anxiety recurs when she has stopped med in past    Past Surgical History  Procedure Date  . Tonsillectomy and adenoidectomy 1951  . Nasal septum surgery 1987    for deviated septum (Dr. Dub Amis)  . Breast enhancement surgery 1978    implants placed '78, removed and replaced  approx '95 (Dr. Dub Amis)  . Cosmetic surgery     LIPOSUCTION; other facial cosmetic surgery  . Inguinial hernia repair 2004    right, and ganglion cyst removal  . Lipoma excision   . Ganglion cyst excision   . Colonoscopy 08/2011  . Melanoma excision 2013    R upper arm    History   Social History  . Marital Status: Single    Spouse Name: N/A    Number of Children: N/A  . Years of Education: N/A   Occupational History  . Not on file.   Social History Main Topics  . Smoking status: Current Everyday Smoker -- 1.0 packs/day    Types: Cigarettes  . Smokeless tobacco: Never Used   Comment: starting to contemplate  . Alcohol Use: Yes     1-2 drink per evening.  . Drug Use: No  . Sexually Active: Not Currently -- Female partner(s)   Other Topics Concern  . Not on  file   Social History Narrative   Lives alone, 1 cat, 1 dog, 2 lovebirds, 4 calico fantail goldfish    Family History  Problem Relation Age of Onset  . Cancer Mother     bladder, metastatic  . Alcohol abuse Father   . Heart disease Father     CHF  . Hyperlipidemia Father   . Hypertension Father   . Aortic aneurysm Sister   . Crohn's disease Sister   . Diabetes Maternal Uncle   . Cancer Maternal Grandmother 98    pancreatic  . Cancer Maternal Grandfather     bladder cancer in 71's  . Stroke Paternal Grandmother   . Heart disease Paternal Grandfather     MI later 52's, early  29's  . Hyperlipidemia Sister   . Cancer Sister     brain cancer in her 56's    Current outpatient prescriptions:b complex vitamins tablet, Take 1 tablet by mouth daily.  , Disp: , Rfl: ;  Calcium-Vitamin D-Vitamin K (VIACTIV) 500-500-40 MG-UNT-MCG CHEW, Chew 2 each by mouth daily., Disp: , Rfl: ;  citalopram (CELEXA) 10 MG tablet, Take 1 tablet (10 mg total) by mouth daily., Disp: 90 tablet, Rfl: 3;  Glucosamine-Chondroitin 750 & 400 MG MISC, Take 2 each by mouth daily. Chews., Disp: , Rfl:  vitamin B-12 (CYANOCOBALAMIN) 100 MCG tablet, Take 100 mcg by mouth daily. , Disp: , Rfl: ;  vitamin C (ASCORBIC ACID) 500 MG tablet, Take 1,000 mg by mouth daily.  , Disp: , Rfl: ;  estrogen, conjugated,-medroxyprogesterone (PREMPRO) 0.625-2.5 MG per tablet, Take 1 tablet by mouth daily., Disp: 30 tablet, Rfl: 1;  fluticasone (FLONASE) 50 MCG/ACT nasal spray, Place 2 sprays into the nose daily., Disp: 16 g, Rfl: 5 (no longer taking prempro)  No Known Allergies  ROS:  The patient denies anorexia, fever, weight changes, headaches,  vision changes, decreased hearing, ear pain, sore throat, breast concerns, chest pain, palpitations, dizziness, syncope, dyspnea on exertion, cough, swelling, nausea, vomiting, diarrhea, constipation, abdominal pain, melena, hematochezia, indigestion/heartburn, hematuria, incontinence, dysuria, vaginal bleeding, discharge, odor or itch, genital lesions, joint pains, numbness, tingling, weakness, tremor, suspicious skin lesions, depression, anxiety, abnormal bleeding/bruising, or enlarged lymph nodes.  PHYSICAL EXAM: BP 112/72  Pulse 64  Ht 5\' 8"  (1.727 m)  Wt 133 lb (60.328 kg)  BMI 20.22 kg/m2  LMP 06/10/1992  General Appearance:    Alert, cooperative, no distress, appears stated age  Head:    Normocephalic, without obvious abnormality, atraumatic  Eyes:    PERRL, conjunctiva/corneas clear, EOM's intact, fundi    benign  Ears:    Normal TM's and external ear canals  Nose:    Nares normal, mucosa with mild edema, no drainage or sinus   tenderness  Throat:   Lips, mucosa, and tongue normal; teeth and gums normal  Neck:   Supple, no lymphadenopathy;  thyroid:  no   enlargement/tenderness/nodules; no carotid   bruit or JVD  Back:    Spine nontender, no curvature, ROM normal, no CVA     tenderness  Lungs:     Clear to auscultation bilaterally without wheezes, rales or     ronchi; respirations unlabored  Chest Wall:    No tenderness or deformity   Heart:    Regular rate and rhythm, S1 and S2 normal, no murmur, rub   or gallop  Breast Exam:    No tenderness, masses, or nipple discharge or inversion.      No axillary lymphadenopathy  Abdomen:  Soft, non-tender, nondistended, normoactive bowel sounds,    no masses, no hepatosplenomegaly  Genitalia:    Normal external genitalia without lesions.  BUS and vagina normal; cervix without lesions, or cervical motion tenderness. No abnormal vaginal discharge.  Uterus and adnexa not enlarged, nontender, no masses.  Pap performed  Rectal:    Normal tone, no masses or tenderness; guaiac negative stool  Extremities:   No clubbing, cyanosis or edema  Pulses:   2+ and symmetric all extremities  Skin:   Skin color, texture, turgor normal, no rashes or lesions  Lymph nodes:   Cervical, supraclavicular, and axillary nodes normal  Neurologic:   CNII-XII intact, normal strength, sensation and gait; reflexes 2+ and symmetric throughout          Psych:   Normal mood, affect, hygiene and grooming.     ASSESSMENT/PLAN:  1. Routine general medical examination at a health care facility  Cytology - PAP  2. Need for prophylactic vaccination and inoculation against influenza  Flu vaccine greater than or equal to 3yo preservative free IM  3. Other malaise and fatigue  CBC with Differential, TSH  4. Macrocytosis  CBC with Differential  5. Screening for diabetes mellitus  Glucose, random  6. Depressive disorder, not elsewhere classified   citalopram (CELEXA) 10 MG tablet  7. Allergic rhinitis, cause unspecified  fluticasone (FLONASE) 50 MCG/ACT nasal spray  8. Anxiety state, unspecified    9. Tobacco use disorder     Extensive counseling regarding tobacco cessation.  She is contemplating quitting, not quite ready. Depression/anxiety--well controlled. Allergies--starting to flare. Restart Flonase, antihistamines  Postmenopausal--successfully is off HRT without symptoms.  May be in a sexual relationship soon--discussed need to use lubrication, and to contact us for consideration of topical estrogens if needed for atrophic vaginitis symptoms.  Discussed monthly self breast exams and yearly mammograms; at least 30 minutes of aerobic activity at least 5 days/week; proper sunscreen use reviewed; healthy diet, including goals of calcium and vitamin D intake and alcohol recommendations (less than or equal to 1 drink/day) reviewed; regular seatbelt use; changing batteries in smoke detectors.  Immunization recommendations discussed--flu shot given.  Colonoscopy recommendations reviewed, UTD.  End of life issues discussed;  Has power of attorney.  MOST form discussed and filled out with patient.  Labs: Fasting sugar, CBC (given high MCV in past), TSH

## 2012-02-14 NOTE — Patient Instructions (Addendum)
HEALTH MAINTENANCE RECOMMENDATIONS:  It is recommended that you get at least 30 minutes of aerobic exercise at least 5 days/week (for weight loss, you may need as much as 60-90 minutes). This can be any activity that gets your heart rate up. This can be divided in 10-15 minute intervals if needed, but try and build up your endurance at least once a week.  Weight bearing exercise is also recommended twice weekly.  Eat a healthy diet with lots of vegetables, fruits and fiber.  "Colorful" foods have a lot of vitamins (ie green vegetables, tomatoes, red peppers, etc).  Limit sweet tea, regular sodas and alcoholic beverages, all of which has a lot of calories and sugar.  Up to 1 alcoholic drink daily may be beneficial for women (unless trying to lose weight, watch sugars).  Drink a lot of water.  Calcium recommendations are 1200-1500 mg daily (1500 mg for postmenopausal women or women without ovaries), and vitamin D 1000 IU daily.  This should be obtained from diet and/or supplements (vitamins), and calcium should not be taken all at once, but in divided doses.  Monthly self breast exams and yearly mammograms for women over the age of 73 is recommended.  Sunscreen of at least SPF 30 should be used on all sun-exposed parts of the skin when outside between the hours of 10 am and 4 pm (not just when at beach or pool, but even with exercise, golf, tennis, and yard work!)  Use a sunscreen that says "broad spectrum" so it covers both UVA and UVB rays, and make sure to reapply every 1-2 hours.  Remember to change the batteries in your smoke detectors when changing your clock times in the spring and fall.  Use your seat belt every time you are in a car, and please drive safely and not be distracted with cell phones and texting while driving.  Please try and quit smoking!!

## 2012-02-15 ENCOUNTER — Encounter: Payer: Self-pay | Admitting: Family Medicine

## 2012-02-20 ENCOUNTER — Encounter: Payer: Self-pay | Admitting: Family Medicine

## 2012-02-26 DIAGNOSIS — H04129 Dry eye syndrome of unspecified lacrimal gland: Secondary | ICD-10-CM | POA: Diagnosis not present

## 2012-06-11 ENCOUNTER — Other Ambulatory Visit: Payer: Self-pay | Admitting: Dermatology

## 2012-06-11 DIAGNOSIS — Z411 Encounter for cosmetic surgery: Secondary | ICD-10-CM | POA: Diagnosis not present

## 2012-06-11 DIAGNOSIS — Z85828 Personal history of other malignant neoplasm of skin: Secondary | ICD-10-CM | POA: Diagnosis not present

## 2012-06-11 DIAGNOSIS — Z85820 Personal history of malignant melanoma of skin: Secondary | ICD-10-CM | POA: Diagnosis not present

## 2012-06-11 DIAGNOSIS — L821 Other seborrheic keratosis: Secondary | ICD-10-CM | POA: Diagnosis not present

## 2012-06-11 DIAGNOSIS — D239 Other benign neoplasm of skin, unspecified: Secondary | ICD-10-CM | POA: Diagnosis not present

## 2012-08-18 ENCOUNTER — Encounter: Payer: Self-pay | Admitting: Medical

## 2012-08-18 ENCOUNTER — Ambulatory Visit (INDEPENDENT_AMBULATORY_CARE_PROVIDER_SITE_OTHER): Payer: Medicare Other | Admitting: Medical

## 2012-08-18 VITALS — BP 138/82 | HR 80 | Temp 97.6°F | Resp 14 | Wt 134.0 lb

## 2012-08-18 DIAGNOSIS — R0602 Shortness of breath: Secondary | ICD-10-CM | POA: Diagnosis not present

## 2012-08-18 DIAGNOSIS — J209 Acute bronchitis, unspecified: Secondary | ICD-10-CM | POA: Diagnosis not present

## 2012-08-18 DIAGNOSIS — F17200 Nicotine dependence, unspecified, uncomplicated: Secondary | ICD-10-CM

## 2012-08-18 MED ORDER — ALBUTEROL SULFATE HFA 108 (90 BASE) MCG/ACT IN AERS: 2.0000 | INHALATION_SPRAY | Freq: Four times a day (QID) | RESPIRATORY_TRACT | Status: DC | PRN

## 2012-08-18 MED ORDER — AZITHROMYCIN 250 MG PO TABS: ORAL_TABLET | ORAL | Status: DC

## 2012-08-18 NOTE — Progress Notes (Signed)
Subjective:  Courtney Ayers is a 67 y.o. female who presents for 1 wk hx/o illness.  Started with head congestion, sore throat, but has progressively worsened to chest congestion, productive sputum, sinus pressure, mucous drainage from sinuses, deep breathing produces sticking sensation in center of chest.   Using mucinex DM.  Denies hemoptysis.  Has felt feverish, wheezy at times, SOB at times.  No hx/o lung disease.  She is a smoker, does have birds at home.  +sick contacts.  No other aggravating or relieving factors.  No other c/o.  The following portions of the patient's history were reviewed and updated as appropriate: allergies, current medications, past family history, past medical history, past social history, past surgical history and problem list.  Past Medical History  Diagnosis Date  . Allergic rhinitis, cause unspecified     seasonal  . Osteopenia   . OA (osteoarthritis)     B THUMB, LEFT HIP  . Adenomatous colon polyp   . Depressive disorder, not elsewhere classified 2006    some anxiety recurs when she has stopped med in past   ROS as in subjective   Objective: Vital signs reviewed  General appearance: Alert, WD/WN, no distress,somewhat ill appearing                             Skin: warm, no rash, no diaphoresis                           Head: no sinus tenderness                            Eyes: conjunctiva normal, corneas clear, PERRLA                            Ears: pearly TMs, external ear canals normal                          Nose: septum midline, turbinates with erythema and clear discharge             Mouth/throat: MMM, tongue normal, mild pharyngeal erythema                           Neck: supple, no adenopathy, no thyromegaly, nontender                          Heart: RRR, normal S1, S2, no murmurs                         Lungs: +bronchial breath sounds, +scattered rhonchi, no wheezes, no rales                Extremities: no edema, nontender      Assessment and Plan: Encounter Diagnoses  Name Primary?  . Acute bronchitis Yes  . Tobacco use disorder   . Shortness of breath    Prescription given today for Azithromycin.  Can use Albuterol 2-3 times daily as discussed.  Discussed use of albuterol, proper use, risks/benefits of medication.   Discussed diagnosis and treatment of bronchitis.  Suggested symptomatic OTC remedies for cough and congestion.  Tylenol OTC for fever and malaise.  Call/return in 2-3 days if symptoms are worse  or not improving.

## 2012-09-08 ENCOUNTER — Encounter: Payer: Self-pay | Admitting: Medical

## 2012-09-08 ENCOUNTER — Ambulatory Visit (INDEPENDENT_AMBULATORY_CARE_PROVIDER_SITE_OTHER): Payer: Medicare Other | Admitting: Medical

## 2012-09-08 VITALS — BP 132/82 | HR 60 | Temp 97.8°F | Resp 16 | Wt 134.0 lb

## 2012-09-08 DIAGNOSIS — R350 Frequency of micturition: Secondary | ICD-10-CM | POA: Diagnosis not present

## 2012-09-08 LAB — POCT URINALYSIS DIPSTICK
Glucose, UA: NEGATIVE
Nitrite, UA: NEGATIVE
Protein, UA: NEGATIVE
Urobilinogen, UA: NEGATIVE

## 2012-09-08 NOTE — Progress Notes (Signed)
Subjective: Here for urine frequency the last several days.  Concerned about UTI.  Has pressure in lower abdominal region, fullness, urgency.   Denies burning with urination, no fever, no back pain.  Denies blood in urine, no odor, no cloudiness.   Has some cramping in the stomach too.  More flatulence of late.  Denies constipation, had some loose stools a few days ago.   No vaginal symptoms, no itching, redness, discharge.  She was on antibiotic about a month ago for bronchitis, seen by me.  Last UTI about year ago.  She doesn't recall specific c/o with UTI last time, found on physical.  Last yeast infection a while ago.   Denies incontinence, no bulging or lumps in pelvis or genital region.  No concern for STD.   No sexual contact in a while.  No other aggravating or relieving factors.     Past Medical History  Diagnosis Date  . Allergic rhinitis, cause unspecified     seasonal  . Osteopenia   . OA (osteoarthritis)     B THUMB, LEFT HIP  . Adenomatous colon polyp   . Depressive disorder, not elsewhere classified 2006    some anxiety recurs when she has stopped med in past   ROS as in subjective   Objective:   Filed Vitals:   09/08/12 1339  BP: 132/82  Pulse: 60  Temp: 97.8 F (36.6 C)  Resp: 16    General appearance: alert, no distress, WD/WN, female Abdomen: +bs, soft, non tender, non distended, no masses, no hepatomegaly, no splenomegaly, no bruits Back: no CVA tenderness Pulses: 2+ symmetric Ext: no edema      Assessment and Plan:   Encounter Diagnosis  Name Primary?  . Urinary frequency Yes   No obvious UTI on urinalysis.  No other specific symptoms.  Advised increased water intake, cranberry juice for a few days, and await culture.  If negative culture or if new symptoms, call or return.   Otherwise, if urine culture negative and persistent symptoms,will need to recheck to consider other diagnoses, rule out other process.  Urine culture sent.

## 2012-09-08 NOTE — Addendum Note (Signed)
Addended by: Janeice Robinson on: 09/08/2012 02:54 PM   Modules accepted: Orders

## 2012-09-09 LAB — URINE CULTURE
Colony Count: NO GROWTH
Organism ID, Bacteria: NO GROWTH

## 2012-09-10 ENCOUNTER — Telehealth: Payer: Self-pay | Admitting: Internal Medicine

## 2012-09-10 NOTE — Telephone Encounter (Signed)
Have her try Azo-Standard through the weekend. Warn her that it will make her urine turn yellow orange. He is still having difficulty by the first of the week have her make another appointment. CALLED PT AND LEFT MESSAGE NB CELL# WORD FOR WORD .

## 2012-09-10 NOTE — Telephone Encounter (Signed)
Have her try Azo-Standard through the weekend. Warn her that it will make her urine turn yellow orange. He is still having difficulty by the first of the week have her make another appointment.

## 2012-09-10 NOTE — Telephone Encounter (Signed)
Urine culture came back no growth and Courtney Ayers said she could just have an overactive bladder and pt wants to know what she can do for an overactive bladder cause she feels she can go to the bathroom all the time

## 2012-12-03 ENCOUNTER — Encounter: Payer: Self-pay | Admitting: Family Medicine

## 2012-12-03 ENCOUNTER — Ambulatory Visit (INDEPENDENT_AMBULATORY_CARE_PROVIDER_SITE_OTHER): Payer: Medicare Other | Admitting: Family Medicine

## 2012-12-03 VITALS — BP 140/82 | HR 87 | Wt 133.0 lb

## 2012-12-03 DIAGNOSIS — R5383 Other fatigue: Secondary | ICD-10-CM | POA: Diagnosis not present

## 2012-12-03 DIAGNOSIS — L74519 Primary focal hyperhidrosis, unspecified: Secondary | ICD-10-CM | POA: Diagnosis not present

## 2012-12-03 DIAGNOSIS — R5381 Other malaise: Secondary | ICD-10-CM | POA: Diagnosis not present

## 2012-12-03 DIAGNOSIS — R61 Generalized hyperhidrosis: Secondary | ICD-10-CM

## 2012-12-03 LAB — CBC WITH DIFFERENTIAL/PLATELET
Basophils Absolute: 0 10*3/uL (ref 0.0–0.1)
Eosinophils Absolute: 0 10*3/uL (ref 0.0–0.7)
Lymphocytes Relative: 28 % (ref 12–46)
Lymphs Abs: 1.5 10*3/uL (ref 0.7–4.0)
MCH: 31.9 pg (ref 26.0–34.0)
Neutrophils Relative %: 66 % (ref 43–77)
Platelets: 217 10*3/uL (ref 150–400)
RBC: 4.67 MIL/uL (ref 3.87–5.11)
RDW: 14.2 % (ref 11.5–15.5)
WBC: 5.3 10*3/uL (ref 4.0–10.5)

## 2012-12-03 LAB — COMPREHENSIVE METABOLIC PANEL
ALT: 19 U/L (ref 0–35)
AST: 23 U/L (ref 0–37)
CO2: 30 mEq/L (ref 19–32)
Creat: 0.7 mg/dL (ref 0.50–1.10)
Sodium: 139 mEq/L (ref 135–145)
Total Bilirubin: 0.8 mg/dL (ref 0.3–1.2)
Total Protein: 6.4 g/dL (ref 6.0–8.3)

## 2012-12-03 LAB — T3, FREE: T3, Free: 3.1 pg/mL (ref 2.3–4.2)

## 2012-12-03 LAB — T4, FREE: Free T4: 1.06 ng/dL (ref 0.80–1.80)

## 2012-12-03 NOTE — Progress Notes (Signed)
  Subjective:    Patient ID: Courtney Ayers, female    DOB: 04-05-1946, 67 y.o.   MRN: [id number]  HPI For one year she complains of excessive heat issues especially heat intolerance, she especially notes nighttime sweating, nail changes, fatigue. When she has these symptoms she feels claustrophobic. She has one or 2 loose stools per week. No chest pain, dizziness, skin changes , fever or chills. She continues on medications listed in the chart. She has been on 10 mg of Celexa for the last several years. Also around the time that the symptoms started, she finished taking HRT. She does state that these symptoms are different than the hot flash symptoms she had that required her being placed on HRT.   Review of Systems     Objective:   Physical Exam alert and in no distress. Tympanic membranes and canals are normal. Throat is clear. Tonsils are normal. Neck is supple without adenopathy or thyromegaly. Cardiac exam shows a regular sinus rhythm without murmurs or gallops. Lungs are clear to auscultation. Skin shows no pathologic changes. Reflexes were normal       Assessment & Plan:  Fatigue - Plan: CBC with Differential, Comprehensive metabolic panel, TSH, T3, Free, T4, Free  Hyperhidrosis - Plan: CBC with Differential, Comprehensive metabolic panel, TSH, T3, Free, T4, Free  discussed treatment. At this point we'll consider stopping the Celexa to see if that is playing a role in the sweats. Also talked about possibly placing her back on HRT.

## 2012-12-04 NOTE — Progress Notes (Signed)
Quick Note:  CALLED PT CELL # PT WAS INFORMED WORD FOR WORD The blood work is normal. Have her stop her Celexa and let me know how she's doing in roughly 2 weeks ______

## 2013-01-08 ENCOUNTER — Encounter: Payer: Self-pay | Admitting: Internal Medicine

## 2013-02-15 DIAGNOSIS — Z1231 Encounter for screening mammogram for malignant neoplasm of breast: Secondary | ICD-10-CM | POA: Diagnosis not present

## 2013-02-15 DIAGNOSIS — M899 Disorder of bone, unspecified: Secondary | ICD-10-CM | POA: Diagnosis not present

## 2013-02-15 LAB — HM DEXA SCAN

## 2013-02-16 ENCOUNTER — Encounter: Payer: Self-pay | Admitting: Internal Medicine

## 2013-03-03 ENCOUNTER — Telehealth: Payer: Self-pay | Admitting: Family Medicine

## 2013-03-03 DIAGNOSIS — F329 Major depressive disorder, single episode, unspecified: Secondary | ICD-10-CM

## 2013-03-03 MED ORDER — CITALOPRAM HYDROBROMIDE 10 MG PO TABS
10.0000 mg | ORAL_TABLET | Freq: Every day | ORAL | Status: DC
Start: 2013-03-03 — End: 2014-03-20

## 2013-03-03 NOTE — Telephone Encounter (Signed)
She has been trying to taper off the Celexa however she has noted an increase in her anxiety. Is is especially true when she drives. She also has a trip planned for December to [place] and is already becoming anxious concerning this and the concern for claustrophobia while on the plane. I will go ahead and restart the Celexa.

## 2013-03-03 NOTE — Telephone Encounter (Signed)
Chart reviewed.  I haven't seen pt in a year.  She recently saw Dr. Susann Givens, who had recommended a trial off med, therefore I'm sending this to him to see his opinion re: if he will refill vs needs f/u

## 2013-03-15 ENCOUNTER — Telehealth: Payer: Self-pay | Admitting: Medical

## 2013-03-15 NOTE — Telephone Encounter (Signed)
She technically doesn't have a hx/o asthma or COPD.  If feeling wheezy/SOB, I would recommend recheck.   We may need to get PFTs, CXR.  I don't see any recent CXR or PFTs to prove whether she has underlying lung disease.  Regarding allergies, I would recommend she ask pharmacist to point her out a good OTC eye drop for allergies.  Insurance won't pay for prescription eye drop without trying OTC first.

## 2013-03-15 NOTE — Telephone Encounter (Signed)
Pt Called requesting a refill on her inhaler of Albuterol (Proventil HFA), and would like to know what kind of eye drops you would suggest for irritation and itchiness. She uses [facility] off [address]. Her call back # is [phone]  °  ° °  ° ° °

## 2013-03-15 NOTE — Telephone Encounter (Signed)
Pt Called requesting a refill on her inhaler of Albuterol (Proventil HFA), and would like to know what kind of eye drops you would suggest for irritation and itchiness. She uses [facility] off [address]. Her call back # is [phone]

## 2013-03-16 ENCOUNTER — Other Ambulatory Visit: Payer: Self-pay | Admitting: Family Medicine

## 2013-03-16 DIAGNOSIS — J302 Other seasonal allergic rhinitis: Secondary | ICD-10-CM

## 2013-03-16 NOTE — Telephone Encounter (Signed)
You can go ahead and set up for CXR first, then schedule f/u appt

## 2013-03-16 NOTE — Telephone Encounter (Signed)
Patient wants to know can you go ahead and order the chest xray. I informed her of your above message and your recommendations about the allergy eye drops. CLS

## 2013-03-16 NOTE — Telephone Encounter (Signed)
I left a message on her voicemail about the chest xray and to follow up here. CLS

## 2013-03-18 ENCOUNTER — Ambulatory Visit
Admission: RE | Admit: 2013-03-18 | Discharge: 2013-03-18 | Disposition: A | Discharge status: Home / Self Care | Payer: Medicare Other | Source: Ambulatory Visit | Attending: Medical | Admitting: Medical

## 2013-03-18 ENCOUNTER — Other Ambulatory Visit (INDEPENDENT_AMBULATORY_CARE_PROVIDER_SITE_OTHER): Payer: Medicare Other

## 2013-03-18 DIAGNOSIS — J302 Other seasonal allergic rhinitis: Secondary | ICD-10-CM

## 2013-03-18 DIAGNOSIS — J438 Other emphysema: Secondary | ICD-10-CM | POA: Diagnosis not present

## 2013-03-18 DIAGNOSIS — Z23 Encounter for immunization: Secondary | ICD-10-CM | POA: Diagnosis not present

## 2013-03-24 ENCOUNTER — Ambulatory Visit (INDEPENDENT_AMBULATORY_CARE_PROVIDER_SITE_OTHER): Payer: Medicare Other | Admitting: Family Medicine

## 2013-03-24 ENCOUNTER — Encounter: Payer: Self-pay | Admitting: Family Medicine

## 2013-03-24 DIAGNOSIS — J439 Emphysema, unspecified: Secondary | ICD-10-CM

## 2013-03-24 DIAGNOSIS — J209 Acute bronchitis, unspecified: Secondary | ICD-10-CM | POA: Diagnosis not present

## 2013-03-24 DIAGNOSIS — F17200 Nicotine dependence, unspecified, uncomplicated: Secondary | ICD-10-CM

## 2013-03-24 DIAGNOSIS — J438 Other emphysema: Secondary | ICD-10-CM

## 2013-03-24 MED ORDER — AMOXICILLIN 875 MG PO TABS: 875.0000 mg | ORAL_TABLET | Freq: Two times a day (BID) | ORAL | Status: DC

## 2013-03-24 MED ORDER — ALBUTEROL SULFATE HFA 108 (90 BASE) MCG/ACT IN AERS: 2.0000 | INHALATION_SPRAY | Freq: Four times a day (QID) | RESPIRATORY_TRACT | Status: DC | PRN

## 2013-03-24 NOTE — Patient Instructions (Signed)
Take all the antibiotic and if you not only back to normal when you finish give me a call

## 2013-03-24 NOTE — Progress Notes (Signed)
  Subjective:    Patient ID: Courtney Ayers, female    DOB: Nov 13, 1945, 67 y.o.   MRN: [id number]  HPI She states that she started having difficulty with sore throat and congestion about 3 weeks ago followed by sinus congestion, slight fever and wheezing. The congestion and cough has continued and she now has a hoarse voice. She does give spring and fall allergies. She occasionally use albuterol. She continues to smoke. No sore throat, earache, fever or chills.  Review of Systems     Objective:   Physical Exam alert and in no distress. Tympanic membranes and canals are normal. Throat is clear. Tonsils are normal. Neck is supple without adenopathy or thyromegaly. Cardiac exam shows a regular sinus rhythm without murmurs or gallops. Lungs are clear to auscultation. Chest x-ray does show evidence of emphysema.       Assessment & Plan:  Tobacco use disorder  Acute bronchitis - Plan: amoxicillin (AMOXIL) 875 MG tablet, albuterol (PROVENTIL HFA;VENTOLIN HFA) 108 (90 BASE) MCG/ACT inhaler  Emphysema of lung  I will treat her with Amoxil and renew her Proventil. Discussed smoking cessation. She is slowly tapering at this point. Recommend she call if she has further questions or she does not entirely get over her cough and congestion.

## 2013-06-29 DIAGNOSIS — Z85828 Personal history of other malignant neoplasm of skin: Secondary | ICD-10-CM | POA: Diagnosis not present

## 2013-06-29 DIAGNOSIS — L719 Rosacea, unspecified: Secondary | ICD-10-CM | POA: Diagnosis not present

## 2013-06-29 DIAGNOSIS — L821 Other seborrheic keratosis: Secondary | ICD-10-CM | POA: Diagnosis not present

## 2013-06-29 DIAGNOSIS — Z85820 Personal history of malignant melanoma of skin: Secondary | ICD-10-CM | POA: Diagnosis not present

## 2013-06-29 DIAGNOSIS — D239 Other benign neoplasm of skin, unspecified: Secondary | ICD-10-CM | POA: Diagnosis not present

## 2013-08-26 ENCOUNTER — Telehealth: Payer: Self-pay | Admitting: Family Medicine

## 2013-08-26 DIAGNOSIS — J209 Acute bronchitis, unspecified: Secondary | ICD-10-CM

## 2013-08-26 MED ORDER — ALBUTEROL SULFATE HFA 108 (90 BASE) MCG/ACT IN AERS: 2.0000 | INHALATION_SPRAY | Freq: Four times a day (QID) | RESPIRATORY_TRACT | Status: DC | PRN

## 2013-08-26 NOTE — Telephone Encounter (Signed)
Prescription written for Proventil HFA. She will attempt to get up from a [place].

## 2013-08-27 ENCOUNTER — Telehealth: Payer: Self-pay | Admitting: Family Medicine

## 2013-09-07 NOTE — Telephone Encounter (Signed)
lm

## 2013-09-13 ENCOUNTER — Encounter: Payer: Self-pay | Admitting: Family Medicine

## 2013-09-13 ENCOUNTER — Ambulatory Visit (INDEPENDENT_AMBULATORY_CARE_PROVIDER_SITE_OTHER): Payer: Medicare Other | Admitting: Family Medicine

## 2013-09-13 VITALS — BP 130/90 | HR 76 | Wt 138.0 lb

## 2013-09-13 DIAGNOSIS — J209 Acute bronchitis, unspecified: Secondary | ICD-10-CM

## 2013-09-13 DIAGNOSIS — J309 Allergic rhinitis, unspecified: Secondary | ICD-10-CM

## 2013-09-13 DIAGNOSIS — J45909 Unspecified asthma, uncomplicated: Secondary | ICD-10-CM

## 2013-09-13 DIAGNOSIS — F17200 Nicotine dependence, unspecified, uncomplicated: Secondary | ICD-10-CM | POA: Diagnosis not present

## 2013-09-13 MED ORDER — AMOXICILLIN 875 MG PO TABS: 875.0000 mg | ORAL_TABLET | Freq: Two times a day (BID) | ORAL | Status: DC

## 2013-09-13 NOTE — Patient Instructions (Signed)
Take all the antibiotic and if not back to her normal self give me a call. Use Afrin nasal spray but only at night

## 2013-09-13 NOTE — Progress Notes (Signed)
   Subjective:    Patient ID: Courtney Ayers, female    DOB: 01/23/1946, 68 y.o.   MRN: [id number]  HPI Approximately 3 weeks ago she started having allergy symptoms of sneezing, itchy watery eyes, runny nose and postnasal drainage. She then developed a slight cough that has now become productive. She also complains of some back pain with coughing she has been using an albuterol inhaler as well. She also complains of malaise and fatigue but no fever or chills. She does smoke a fair 4 cigarettes per day.  Review of Systems     Objective:   Physical Exam alert and in no distress. Tympanic membranes and canals are normal. Throat is clear. Tonsils are normal. Neck is supple without adenopathy or thyromegaly. Cardiac exam shows a regular sinus rhythm without murmurs or gallops. Lungs are clear to auscultation.        Assessment & Plan:  Allergic rhinitis, cause unspecified  Asthma due to environmental allergies  Acute bronchitis - Plan: amoxicillin (AMOXIL) 875 MG tablet  Current smoker on some days  I didn't say too much to her about her smoking other than the fact that they seem to be her" friends". Also recommended using Afrin nasal spray but only at night. She will call if not entirely better when she finishes the course of the antibiotic.

## 2013-12-09 ENCOUNTER — Telehealth: Payer: Self-pay | Admitting: Family Medicine

## 2013-12-13 NOTE — Telephone Encounter (Signed)
Take care of this 

## 2013-12-13 NOTE — Telephone Encounter (Signed)
Faxed proventil hfa per [facility]

## 2013-12-16 NOTE — Telephone Encounter (Signed)
DONE REFAXED TO NEW #

## 2013-12-20 ENCOUNTER — Telehealth: Payer: Self-pay | Admitting: Internal Medicine

## 2013-12-20 NOTE — Telephone Encounter (Signed)
Pt called stating that the salbutamol inhaler rx sent to [facility] needs to be refaxed as it was not readable and the date was not clear. Please send again. Pt is aware that our fax machine is down right now

## 2013-12-22 NOTE — Telephone Encounter (Signed)
FAXED FOR THE 3RD TIME

## 2014-03-20 ENCOUNTER — Other Ambulatory Visit: Payer: Self-pay | Admitting: Family Medicine

## 2014-03-21 NOTE — Telephone Encounter (Signed)
Is this okay?

## 2014-03-21 NOTE — Telephone Encounter (Signed)
DR.LALONDE IS THIS OKAY 

## 2014-03-21 NOTE — Telephone Encounter (Signed)
I HAVE CALLED AND LEFT MESSAGE OF DR.LALONDE HAS ASKED FOR HER TO MAKE APPOINT WITH IN THE NEXT 2 MTH HE DID REFILL HER MEDS

## 2014-03-21 NOTE — Telephone Encounter (Signed)
I renewed her medication but have her set up a followup appointment in the next month or 2

## 2014-03-21 NOTE — Telephone Encounter (Signed)
She will need a follow-up appointment.

## 2014-03-21 NOTE — Telephone Encounter (Signed)
Hasn't seen me in years.  Last saw Dr. Redmond School in April, but for acute visit.  Likely needs to be seen.  Not really sure whose patient she is

## 2014-03-24 ENCOUNTER — Encounter: Payer: Self-pay | Admitting: Family Medicine

## 2014-03-24 ENCOUNTER — Ambulatory Visit (INDEPENDENT_AMBULATORY_CARE_PROVIDER_SITE_OTHER): Payer: Medicare Other | Admitting: Family Medicine

## 2014-03-24 VITALS — BP 110/66 | HR 103 | Wt 138.0 lb

## 2014-03-24 DIAGNOSIS — Z720 Tobacco use: Secondary | ICD-10-CM

## 2014-03-24 DIAGNOSIS — F411 Generalized anxiety disorder: Secondary | ICD-10-CM | POA: Diagnosis not present

## 2014-03-24 DIAGNOSIS — Z23 Encounter for immunization: Secondary | ICD-10-CM

## 2014-03-24 DIAGNOSIS — F17200 Nicotine dependence, unspecified, uncomplicated: Secondary | ICD-10-CM

## 2014-03-24 DIAGNOSIS — Z86010 Personal history of colonic polyps: Secondary | ICD-10-CM

## 2014-03-24 DIAGNOSIS — J301 Allergic rhinitis due to pollen: Secondary | ICD-10-CM | POA: Diagnosis not present

## 2014-03-24 MED ORDER — CITALOPRAM HYDROBROMIDE 10 MG PO TABS: ORAL_TABLET | ORAL | Status: DC

## 2014-03-24 NOTE — Patient Instructions (Signed)
Use Flonase daily and either Claritin or Allegra

## 2014-03-24 NOTE — Progress Notes (Signed)
   Subjective:    Patient ID: Lenon Oms, female    DOB: [birth year], 68 y.o.   MRN: [id number]  HPI She is here for an interval evaluation. She is having difficulty with allergies. She does occasionally use Flonase as well as an antihistamine. She continues to smoke under a pack per day. She continues to do well on her Celexa. This helps keep her chronic anxiety under control. She does have a history of colonic polyps and is scheduled on the 5 year basis. Otherwise she is doing well and has no other concerns.   Review of Systems     Objective:   Physical Exam alert and in no distress. Tympanic membranes and canals are normal. Throat is clear. Tonsils are normal. Neck is supple without adenopathy or thyromegaly. Cardiac exam shows a regular sinus rhythm without murmurs or gallops. Lungs are clear to auscultation.        Assessment & Plan:  Allergic rhinitis due to pollen  Need for prophylactic vaccination and inoculation against influenza - Plan: Flu vaccine HIGH DOSE PF (Fluzone Tri High dose)  Need for prophylactic vaccination against Streptococcus pneumoniae (pneumococcus) - Plan: Pneumococcal conjugate vaccine 13-valent  Anxiety state - Plan: citalopram (CELEXA) 10 MG tablet  Tobacco use disorder  History of colonic polyps  her immunizations were updated. Discussed smoking cessation with her. She is interested in hypnosis which I encouraged. Recommend she use Flonase regularly as well as either Claritin or Allegra to help with her allergies. Continue Celexa.

## 2014-06-14 ENCOUNTER — Ambulatory Visit (INDEPENDENT_AMBULATORY_CARE_PROVIDER_SITE_OTHER): Payer: Medicare Other | Admitting: Family Medicine

## 2014-06-14 ENCOUNTER — Encounter: Payer: Self-pay | Admitting: Internal Medicine

## 2014-06-14 ENCOUNTER — Encounter: Payer: Self-pay | Admitting: Family Medicine

## 2014-06-14 VITALS — BP 114/70 | HR 118 | Wt 140.0 lb

## 2014-06-14 DIAGNOSIS — M25551 Pain in right hip: Secondary | ICD-10-CM

## 2014-06-14 DIAGNOSIS — M25552 Pain in left hip: Secondary | ICD-10-CM | POA: Diagnosis not present

## 2014-06-14 DIAGNOSIS — M545 Low back pain, unspecified: Secondary | ICD-10-CM

## 2014-06-14 DIAGNOSIS — Z1231 Encounter for screening mammogram for malignant neoplasm of breast: Secondary | ICD-10-CM | POA: Diagnosis not present

## 2014-06-14 LAB — HM MAMMOGRAPHY: HM Mammogram: NEGATIVE

## 2014-06-14 NOTE — Progress Notes (Signed)
   Subjective:    Patient ID: Courtney Ayers, female    DOB: [birth year], 69 y.o.   MRN: [id number]  HPI She is here for evaluation of low back pain. This started right around Christmas. There is no history of injury. She's having no weakness, numbness, tingling, bowel or bladder problems. She notes that it hurts when she stands up.   Review of Systems     Objective:   Physical Exam full motion of the back. Minimal tenderness to palpation over the right SI joint. Provocative testing was negative. There is slight limitation of hip motion especially with internal rotation. Negative straight leg raising. Normal motor, sensory and DTRs.      Assessment & Plan:  Bilateral hip pain  Bilateral low back pain without sciatica Heat for 20 minutes 3 times per day and then stretch after that. Take 2 Aleve twice per day. Do this for the next 10 days and if still having difficulty call me and I will order x-rays I explained that her back pain could easily be from the hips. We will attempt conservative care initially however she continues to have trouble, back and hip x-rays will be ordered.

## 2014-06-14 NOTE — Patient Instructions (Signed)
Heat for 20 minutes 3 times per day and then stretch after that. Take 2 Aleve twice per day. Do this for the next 10 days and if still having difficulty call me and I will order x-rays

## 2014-06-28 ENCOUNTER — Telehealth: Payer: Self-pay | Admitting: Internal Medicine

## 2014-06-28 NOTE — Telephone Encounter (Signed)
Take care of this 

## 2014-06-28 NOTE — Telephone Encounter (Signed)
Faxed over rx to [facility] for pt

## 2014-06-28 NOTE — Telephone Encounter (Signed)
Pt called and wants albuterol send to a [facility]. She needs the following info faxed into them.  [facility] Al[facility]ol 153mcg/200 doses 2 puffs as needed Quantity 6 Comfirmation # [id number] Fax # [phone]

## 2014-07-11 DIAGNOSIS — L719 Rosacea, unspecified: Secondary | ICD-10-CM | POA: Diagnosis not present

## 2014-07-11 DIAGNOSIS — Z87898 Personal history of other specified conditions: Secondary | ICD-10-CM | POA: Diagnosis not present

## 2014-07-11 DIAGNOSIS — L821 Other seborrheic keratosis: Secondary | ICD-10-CM | POA: Diagnosis not present

## 2014-07-11 DIAGNOSIS — D225 Melanocytic nevi of trunk: Secondary | ICD-10-CM | POA: Diagnosis not present

## 2014-07-11 DIAGNOSIS — Z85828 Personal history of other malignant neoplasm of skin: Secondary | ICD-10-CM | POA: Diagnosis not present

## 2014-08-05 ENCOUNTER — Encounter (HOSPITAL_COMMUNITY): Payer: Self-pay | Admitting: *Deleted

## 2014-08-05 ENCOUNTER — Inpatient Hospital Stay (HOSPITAL_COMMUNITY)
Admission: EM | Admit: 2014-08-05 | Discharge: 2014-08-12 | DRG: 190 | Disposition: A | Discharge status: Home / Self Care | Payer: Medicare Other | Attending: Internal Medicine | Admitting: Internal Medicine

## 2014-08-05 ENCOUNTER — Emergency Department (HOSPITAL_COMMUNITY): Payer: Medicare Other

## 2014-08-05 DIAGNOSIS — Z85820 Personal history of malignant melanoma of skin: Secondary | ICD-10-CM | POA: Diagnosis not present

## 2014-08-05 DIAGNOSIS — Z716 Tobacco abuse counseling: Secondary | ICD-10-CM | POA: Diagnosis present

## 2014-08-05 DIAGNOSIS — R911 Solitary pulmonary nodule: Secondary | ICD-10-CM | POA: Diagnosis present

## 2014-08-05 DIAGNOSIS — R079 Chest pain, unspecified: Secondary | ICD-10-CM | POA: Diagnosis not present

## 2014-08-05 DIAGNOSIS — J208 Acute bronchitis due to other specified organisms: Secondary | ICD-10-CM | POA: Diagnosis not present

## 2014-08-05 DIAGNOSIS — R069 Unspecified abnormalities of breathing: Secondary | ICD-10-CM | POA: Diagnosis not present

## 2014-08-05 DIAGNOSIS — J439 Emphysema, unspecified: Secondary | ICD-10-CM | POA: Diagnosis not present

## 2014-08-05 DIAGNOSIS — R0600 Dyspnea, unspecified: Secondary | ICD-10-CM

## 2014-08-05 DIAGNOSIS — M1990 Unspecified osteoarthritis, unspecified site: Secondary | ICD-10-CM | POA: Diagnosis present

## 2014-08-05 DIAGNOSIS — J432 Centrilobular emphysema: Secondary | ICD-10-CM | POA: Diagnosis not present

## 2014-08-05 DIAGNOSIS — J209 Acute bronchitis, unspecified: Secondary | ICD-10-CM | POA: Diagnosis present

## 2014-08-05 DIAGNOSIS — J440 Chronic obstructive pulmonary disease with acute lower respiratory infection: Secondary | ICD-10-CM | POA: Diagnosis present

## 2014-08-05 DIAGNOSIS — J449 Chronic obstructive pulmonary disease, unspecified: Secondary | ICD-10-CM

## 2014-08-05 DIAGNOSIS — R071 Chest pain on breathing: Secondary | ICD-10-CM | POA: Diagnosis not present

## 2014-08-05 DIAGNOSIS — R0602 Shortness of breath: Secondary | ICD-10-CM | POA: Diagnosis not present

## 2014-08-05 DIAGNOSIS — F329 Major depressive disorder, single episode, unspecified: Secondary | ICD-10-CM | POA: Diagnosis present

## 2014-08-05 DIAGNOSIS — F17210 Nicotine dependence, cigarettes, uncomplicated: Secondary | ICD-10-CM | POA: Diagnosis present

## 2014-08-05 DIAGNOSIS — G9341 Metabolic encephalopathy: Secondary | ICD-10-CM | POA: Diagnosis not present

## 2014-08-05 DIAGNOSIS — Z7951 Long term (current) use of inhaled steroids: Secondary | ICD-10-CM | POA: Diagnosis not present

## 2014-08-05 DIAGNOSIS — J90 Pleural effusion, not elsewhere classified: Secondary | ICD-10-CM | POA: Diagnosis not present

## 2014-08-05 DIAGNOSIS — T380X5A Adverse effect of glucocorticoids and synthetic analogues, initial encounter: Secondary | ICD-10-CM | POA: Diagnosis present

## 2014-08-05 DIAGNOSIS — R739 Hyperglycemia, unspecified: Secondary | ICD-10-CM | POA: Diagnosis present

## 2014-08-05 DIAGNOSIS — J9601 Acute respiratory failure with hypoxia: Secondary | ICD-10-CM | POA: Diagnosis not present

## 2014-08-05 DIAGNOSIS — J441 Chronic obstructive pulmonary disease with (acute) exacerbation: Secondary | ICD-10-CM | POA: Insufficient documentation

## 2014-08-05 DIAGNOSIS — J309 Allergic rhinitis, unspecified: Secondary | ICD-10-CM | POA: Diagnosis present

## 2014-08-05 DIAGNOSIS — F411 Generalized anxiety disorder: Secondary | ICD-10-CM | POA: Diagnosis present

## 2014-08-05 DIAGNOSIS — Z86010 Personal history of colonic polyps: Secondary | ICD-10-CM | POA: Diagnosis not present

## 2014-08-05 DIAGNOSIS — J9622 Acute and chronic respiratory failure with hypercapnia: Secondary | ICD-10-CM | POA: Diagnosis present

## 2014-08-05 DIAGNOSIS — M549 Dorsalgia, unspecified: Secondary | ICD-10-CM | POA: Diagnosis present

## 2014-08-05 DIAGNOSIS — F419 Anxiety disorder, unspecified: Secondary | ICD-10-CM | POA: Diagnosis present

## 2014-08-05 DIAGNOSIS — Z720 Tobacco use: Secondary | ICD-10-CM | POA: Diagnosis not present

## 2014-08-05 DIAGNOSIS — F17200 Nicotine dependence, unspecified, uncomplicated: Secondary | ICD-10-CM | POA: Diagnosis present

## 2014-08-05 DIAGNOSIS — J9621 Acute and chronic respiratory failure with hypoxia: Secondary | ICD-10-CM | POA: Diagnosis present

## 2014-08-05 LAB — CBC
HCT: 43.4 % (ref 36.0–46.0)
Hemoglobin: 14.3 g/dL (ref 12.0–15.0)
MCH: 31.9 pg (ref 26.0–34.0)
MCHC: 32.9 g/dL (ref 30.0–36.0)
MCV: 96.9 fL (ref 78.0–100.0)
PLATELETS: 154 10*3/uL (ref 150–400)
RBC: 4.48 MIL/uL (ref 3.87–5.11)
RDW: 13.4 % (ref 11.5–15.5)
WBC: 5.9 10*3/uL (ref 4.0–10.5)

## 2014-08-05 LAB — COMPREHENSIVE METABOLIC PANEL
ALBUMIN: 4.3 g/dL (ref 3.5–5.2)
ALT: 20 U/L (ref 0–35)
AST: 29 U/L (ref 0–37)
Alkaline Phosphatase: 92 U/L (ref 39–117)
Anion gap: 7 (ref 5–15)
BILIRUBIN TOTAL: 0.7 mg/dL (ref 0.3–1.2)
BUN: 7 mg/dL (ref 6–23)
CHLORIDE: 97 mmol/L (ref 96–112)
CO2: 29 mmol/L (ref 19–32)
Calcium: 8.8 mg/dL (ref 8.4–10.5)
Creatinine, Ser: 0.57 mg/dL (ref 0.50–1.10)
GFR calc Af Amer: >90 mL/min (ref >90)
Glucose, Bld: 135 mg/dL — ABNORMAL HIGH (ref 70–99)
POTASSIUM: 4.1 mmol/L (ref 3.5–5.1)
SODIUM: 133 mmol/L — AB (ref 135–145)
Total Protein: 6.9 g/dL (ref 6.0–8.3)

## 2014-08-05 LAB — I-STAT TROPONIN, ED: Troponin i, poc: 0 ng/mL (ref 0.00–0.08)

## 2014-08-05 LAB — BRAIN NATRIURETIC PEPTIDE: B Natriuretic Peptide: 44.5 pg/mL (ref 0.0–100.0)

## 2014-08-05 LAB — D-DIMER, QUANTITATIVE (NOT AT ARMC): D-Dimer, Quant: 0.7 ug/mL-FEU — ABNORMAL HIGH (ref 0.00–0.48)

## 2014-08-05 MED ORDER — VITAMIN C 500 MG PO TABS
1000.0000 mg | ORAL_TABLET | Freq: Every day | ORAL | Status: DC
  Administered 2014-08-05 – 2014-08-07 (×3): 1000 mg via ORAL
  Filled 2014-08-05 (×3): qty 2

## 2014-08-05 MED ORDER — ALBUTEROL SULFATE (2.5 MG/3ML) 0.083% IN NEBU
INHALATION_SOLUTION | RESPIRATORY_TRACT | Status: AC
  Filled 2014-08-05: qty 3

## 2014-08-05 MED ORDER — ONDANSETRON HCL 4 MG/2ML IJ SOLN
4.0000 mg | Freq: Once | INTRAMUSCULAR | Status: AC
  Administered 2014-08-05: 4 mg via INTRAVENOUS
  Filled 2014-08-05: qty 2

## 2014-08-05 MED ORDER — SODIUM CHLORIDE 0.9 % IV SOLN
INTRAVENOUS | Status: AC
  Administered 2014-08-05: 21:00:00 via INTRAVENOUS

## 2014-08-05 MED ORDER — ALBUTEROL SULFATE (2.5 MG/3ML) 0.083% IN NEBU
INHALATION_SOLUTION | RESPIRATORY_TRACT | Status: AC
  Administered 2014-08-05: 2.5 mg via RESPIRATORY_TRACT
  Filled 2014-08-05: qty 3

## 2014-08-05 MED ORDER — LEVOFLOXACIN 750 MG PO TABS
750.0000 mg | ORAL_TABLET | Freq: Every day | ORAL | Status: DC
  Administered 2014-08-05 – 2014-08-09 (×5): 750 mg via ORAL
  Filled 2014-08-05 (×5): qty 1

## 2014-08-05 MED ORDER — ALBUTEROL SULFATE (2.5 MG/3ML) 0.083% IN NEBU
5.0000 mg | INHALATION_SOLUTION | Freq: Once | RESPIRATORY_TRACT | Status: AC
  Administered 2014-08-05: 5 mg via RESPIRATORY_TRACT

## 2014-08-05 MED ORDER — LORAZEPAM 0.5 MG PO TABS
0.5000 mg | ORAL_TABLET | Freq: Once | ORAL | Status: AC
  Administered 2014-08-05: 0.5 mg via ORAL
  Filled 2014-08-05: qty 1

## 2014-08-05 MED ORDER — ALBUTEROL SULFATE (2.5 MG/3ML) 0.083% IN NEBU
INHALATION_SOLUTION | RESPIRATORY_TRACT | Status: AC
  Filled 2014-08-05: qty 6

## 2014-08-05 MED ORDER — ACETAMINOPHEN 325 MG PO TABS
650.0000 mg | ORAL_TABLET | Freq: Four times a day (QID) | ORAL | Status: DC | PRN
  Administered 2014-08-11: 650 mg via ORAL
  Filled 2014-08-05: qty 2

## 2014-08-05 MED ORDER — B COMPLEX-C PO TABS
1.0000 | ORAL_TABLET | Freq: Every day | ORAL | Status: DC
  Administered 2014-08-05 – 2014-08-07 (×3): 1 via ORAL
  Filled 2014-08-05 (×3): qty 1

## 2014-08-05 MED ORDER — IBUPROFEN 400 MG PO TABS
400.0000 mg | ORAL_TABLET | Freq: Once | ORAL | Status: AC
  Administered 2014-08-05: 400 mg via ORAL
  Filled 2014-08-05: qty 1

## 2014-08-05 MED ORDER — CITALOPRAM HYDROBROMIDE 10 MG PO TABS
10.0000 mg | ORAL_TABLET | Freq: Every day | ORAL | Status: DC
  Administered 2014-08-05 – 2014-08-07 (×3): 10 mg via ORAL
  Filled 2014-08-05 (×3): qty 1

## 2014-08-05 MED ORDER — CALCIUM CARBONATE-VITAMIN D 500-200 MG-UNIT PO TABS
ORAL_TABLET | Freq: Every day | ORAL | Status: DC
  Administered 2014-08-06 – 2014-08-07 (×2): 1 via ORAL
  Filled 2014-08-05 (×2): qty 2

## 2014-08-05 MED ORDER — IOHEXOL 350 MG/ML SOLN
80.0000 mL | Freq: Once | INTRAVENOUS | Status: AC | PRN
  Administered 2014-08-05: 80 mL via INTRAVENOUS

## 2014-08-05 MED ORDER — SODIUM CHLORIDE 0.9 % IJ SOLN
3.0000 mL | Freq: Two times a day (BID) | INTRAMUSCULAR | Status: DC
  Administered 2014-08-06: 3 mL via INTRAVENOUS
  Administered 2014-08-07: 10 mL via INTRAVENOUS
  Administered 2014-08-07 – 2014-08-12 (×8): 3 mL via INTRAVENOUS

## 2014-08-05 MED ORDER — ONDANSETRON HCL 4 MG/2ML IJ SOLN
4.0000 mg | Freq: Three times a day (TID) | INTRAMUSCULAR | Status: DC | PRN
Start: 2014-08-05 — End: 2014-08-05

## 2014-08-05 MED ORDER — SODIUM CHLORIDE 0.9 % IV BOLUS (SEPSIS)
1000.0000 mL | Freq: Once | INTRAVENOUS | Status: AC
  Administered 2014-08-05: 1000 mL via INTRAVENOUS

## 2014-08-05 MED ORDER — ONDANSETRON HCL 4 MG PO TABS: 4.0000 mg | ORAL_TABLET | Freq: Four times a day (QID) | ORAL | Status: DC | PRN

## 2014-08-05 MED ORDER — AZITHROMYCIN 250 MG PO TABS: 250.0000 mg | ORAL_TABLET | Freq: Every day | ORAL | Status: AC

## 2014-08-05 MED ORDER — PREDNISONE 20 MG PO TABS: 60.0000 mg | ORAL_TABLET | Freq: Every day | ORAL | Status: AC

## 2014-08-05 MED ORDER — METHYLPREDNISOLONE SODIUM SUCC 125 MG IJ SOLR: 125.0000 mg | INTRAMUSCULAR | Status: DC

## 2014-08-05 MED ORDER — AZITHROMYCIN 250 MG PO TABS
500.0000 mg | ORAL_TABLET | Freq: Once | ORAL | Status: AC
  Administered 2014-08-05: 500 mg via ORAL
  Filled 2014-08-05: qty 2

## 2014-08-05 MED ORDER — METHYLPREDNISOLONE SODIUM SUCC 125 MG IJ SOLR
60.0000 mg | Freq: Three times a day (TID) | INTRAMUSCULAR | Status: DC
  Administered 2014-08-05 – 2014-08-07 (×5): 60 mg via INTRAVENOUS
  Filled 2014-08-05: qty 0.96
  Filled 2014-08-05: qty 2
  Filled 2014-08-05: qty 0.96
  Filled 2014-08-05: qty 2
  Filled 2014-08-05 (×3): qty 0.96
  Filled 2014-08-05: qty 2
  Filled 2014-08-05: qty 0.96

## 2014-08-05 MED ORDER — ALBUTEROL SULFATE (2.5 MG/3ML) 0.083% IN NEBU
2.5000 mg | INHALATION_SOLUTION | RESPIRATORY_TRACT | Status: DC | PRN
  Administered 2014-08-05 – 2014-08-07 (×3): 2.5 mg via RESPIRATORY_TRACT
  Filled 2014-08-05 (×5): qty 3

## 2014-08-05 MED ORDER — ONDANSETRON HCL 4 MG/2ML IJ SOLN
4.0000 mg | Freq: Four times a day (QID) | INTRAMUSCULAR | Status: DC | PRN
  Administered 2014-08-05: 4 mg via INTRAVENOUS
  Filled 2014-08-05: qty 2

## 2014-08-05 MED ORDER — VITAMIN B-12 100 MCG PO TABS
100.0000 ug | ORAL_TABLET | Freq: Every day | ORAL | Status: DC
  Administered 2014-08-05 – 2014-08-07 (×3): 100 ug via ORAL
  Filled 2014-08-05 (×3): qty 1

## 2014-08-05 MED ORDER — ACETAMINOPHEN 650 MG RE SUPP: 650.0000 mg | Freq: Four times a day (QID) | RECTAL | Status: DC | PRN

## 2014-08-05 MED ORDER — ENOXAPARIN SODIUM 40 MG/0.4ML ~~LOC~~ SOLN
40.0000 mg | SUBCUTANEOUS | Status: DC
Start: 2014-08-05 — End: 2014-08-12
  Administered 2014-08-05 – 2014-08-11 (×7): 40 mg via SUBCUTANEOUS
  Filled 2014-08-05 (×8): qty 0.4

## 2014-08-05 MED ORDER — METHYLPREDNISOLONE SODIUM SUCC 125 MG IJ SOLR
60.0000 mg | Freq: Once | INTRAMUSCULAR | Status: AC
  Administered 2014-08-05: 60 mg via INTRAVENOUS

## 2014-08-05 MED ORDER — VITAMIN E 180 MG (400 UNIT) PO CAPS
400.0000 [IU] | ORAL_CAPSULE | Freq: Every day | ORAL | Status: DC
  Administered 2014-08-05 – 2014-08-07 (×3): 400 [IU] via ORAL
  Filled 2014-08-05 (×3): qty 1

## 2014-08-05 MED ORDER — ALBUTEROL SULFATE (2.5 MG/3ML) 0.083% IN NEBU
2.5000 mg | INHALATION_SOLUTION | RESPIRATORY_TRACT | Status: AC
  Administered 2014-08-05 – 2014-08-06 (×2): 2.5 mg via RESPIRATORY_TRACT
  Filled 2014-08-05 (×3): qty 3

## 2014-08-05 MED ORDER — IPRATROPIUM-ALBUTEROL 0.5-2.5 (3) MG/3ML IN SOLN
3.0000 mL | RESPIRATORY_TRACT | Status: DC
  Administered 2014-08-05 – 2014-08-07 (×11): 3 mL via RESPIRATORY_TRACT
  Filled 2014-08-05 (×12): qty 3

## 2014-08-05 MED ORDER — GUAIFENESIN-DM 100-10 MG/5ML PO SYRP
5.0000 mL | ORAL_SOLUTION | ORAL | Status: DC | PRN
  Filled 2014-08-05: qty 5

## 2014-08-05 NOTE — Discharge Instructions (Signed)
As discussed, your evaluation today has been reassuring in terms of findings concerning for ongoing heart issues, or blood clots.  Your breathing difficulties are likely due to bronchitis.  There was a nodule visualized on CT scan - this requires follow up imaging (please discuss with your physician).

## 2014-08-05 NOTE — H&P (Addendum)
[facility] History and Physical  Courtney Ayers:[id number] DOB: Mar 27, 1946 DOA: 08/05/2014  Referring physician: Dr. Vanita Panda PCP: Vikki Ports, MD   Chief Complaint:  Cough with shortness of breath progressive over past few days   HPI:  69 year old female with history of over 40-pack-year smoking, depression presented to the ED with progressive cough with productive sputum and dyspnea on exertion . She reports that she has been having progressive shortness of breath for the past 1 year associated with cough with productive sputum and most of the days . This is started to get worse over the past 1-2 weeks . She now reports dyspnea on minimal exertion and not improved with her home inhaler. She does not have a diagnosis of COPD and has not seen a pulmonologist. Reports back pain. Patient denies headache, dizziness, fever, chills, nausea , vomiting, chest pain, palpitations, abdominal pain, bowel or urinary symptoms. Denies change in weight or appetite. She denies any recent illness, sick contacts or recent travel  Course in the ED Patient was mildly tachycardic, tachypneic and had dropping O2 sat were 83% on room air improved to high 90s on 3 L via nasal cannula. CBC was normal, chemistry showed sodium of 133, potassium 4.1, normal renal function. Chest x-ray was unremarkable. CT angiogram of the chest was done given mildly elevated d-dimer and was negative for PE but showed centrilobular emphysema in the upper lobes. Also showed an incidental finding of right lower lobe pulmonary nodule measuring 10 mm. Patient was given albuterol neb X2, 1 L IV normal saline and 500 mg azithromycin hospitalists admission requested to telemetry.  Review of Systems:  Constitutional: Denies fever, chills, diaphoresis, appetite change and fatigue.  HEENT: Denies visual or hearing symptoms, ear pain, congestion, sore throat, rhinorrhea, sneezing, mouth sores, trouble swallowing, neck  pain Respiratory: SOB, DOE, cough, denies chest tightness,  and wheezing.   Cardiovascular: Denies chest pain, palpitations and leg swelling.  Gastrointestinal: Denies nausea, vomiting, abdominal pain, diarrhea, constipation, blood in stool and abdominal distention.  Genitourinary: Denies dysuria,  hematuria, flank pain and difficulty urinating.  Endocrine: Denies: hot or cold intolerance,  polyuria, polydipsia. Musculoskeletal: Back pain, Denies myalgias,joint pain or swelling Skin: Denies pallor, rash and wound.  Neurological: Denies dizziness,  syncope, weakness, light-headedness, numbness and headaches.  Hematological: Denies adenopathy.  Psychiatric/Behavioral: Denies confusion or mood changes  Past Medical History  Diagnosis Date  . Allergic rhinitis, cause unspecified     seasonal  . Osteopenia   . OA (osteoarthritis)     B THUMB, LEFT HIP  . Adenomatous colon polyp   . Depressive disorder, not elsewhere classified 2006    some anxiety recurs when she has stopped med in past   Past Surgical History  Procedure Laterality Date  . Tonsillectomy and adenoidectomy  1951  . Nasal septum surgery  1987    for deviated septum (Dr. Wendy Poet)  . Breast enhancement surgery  1978    implants placed '78, removed and replaced  approx '95 (Dr. Wendy Poet)  . Cosmetic surgery      LIPOSUCTION; other facial cosmetic surgery  . Inguinial hernia repair  2004    right, and ganglion cyst removal  . Lipoma excision    . Ganglion cyst excision    . Colonoscopy  08/2011  . Melanoma excision  2013    R upper arm   Social History: Almost 40 years, reports having to drink a day. Denies any withdrawal symptoms. Denies illicit drug use  No Known Allergies  Family History  Problem Relation Age of Onset  . Cancer Mother     bladder, metastatic  . Alcohol abuse Father   . Heart disease Father     CHF  . Hyperlipidemia Father   . Hypertension Father   . Aortic aneurysm Sister   . Crohn's  disease Sister   . Diabetes Maternal Uncle   . Cancer Maternal Grandmother 98    pancreatic  . Cancer Maternal Grandfather     bladder cancer in 1's  . Stroke Paternal Grandmother   . Heart disease Paternal Grandfather     MI later 91's, early 29's  . Hyperlipidemia Sister   . Cancer Sister     brain cancer in her 74's    Prior to Admission medications   Medication Sig Start Date End Date Taking? Authorizing Provider  b complex vitamins tablet Take 1 tablet by mouth daily.     Yes Historical Provider, MD  Calcium-Vitamin D-Vitamin K (VIACTIV) 170-017-49 MG-UNT-MCG CHEW Chew 2 each by mouth daily.   Yes Historical Provider, MD  citalopram (CELEXA) 10 MG tablet take 1 tablet by mouth once daily 03/24/14  Yes [name], MD  guaiFENesin (MUCINEX) 600 MG 12 hr tablet Take 600 mg by mouth at bedtime.   Yes Historical Provider, MD  ibuprofen (ADVIL,MOTRIN) 200 MG tablet Take 400 mg by mouth every 6 (six) hours as needed for mild pain.    Yes Historical Provider, MD  PRESCRIPTION MEDICATION Inhale 1 spray into the lungs as needed (wheezing, shortness of breath). ASTHALIN 100MCG/DOSE INHALER  AS NEEDED PT GETS FROM [facility]   Yes Historical Provider, MD  vitamin B-12 (CYANOCOBALAMIN) 100 MCG tablet Take 100 mcg by mouth daily.    Yes Historical Provider, MD  vitamin C (ASCORBIC ACID) 500 MG tablet Take 1,000 mg by mouth daily.     Yes Historical Provider, MD  vitamin E 400 UNIT capsule Take 400 Units by mouth daily.   Yes Historical Provider, MD  azithromycin (ZITHROMAX Z-PAK) 250 MG tablet Take 1 tablet (250 mg total) by mouth daily. 08/06/14 08/09/14  Carmin Muskrat, MD  fluticasone (FLONASE) 50 MCG/ACT nasal spray Place 2 sprays into the nose daily. Patient not taking: Reported on 08/05/2014 02/14/12 03/24/14  Rita Ohara, MD  predniSONE (DELTASONE) 20 MG tablet Take 3 tablets (60 mg total) by mouth daily. 08/06/14 08/09/14  Carmin Muskrat, MD     Physical Exam:  Filed Vitals:   08/05/14 1515  08/05/14 1530 08/05/14 1615 08/05/14 1700  BP:      Pulse: 87 86 94 88  Temp:      TempSrc:      Resp: 15 17 25 17   Height:      Weight:      SpO2: 99% 100% 97% 99%    Constitutional: Vital signs reviewed.  Elderly thin built female sitting up in bed in no acute distress, using accessory muscles of respiration HEENT: no pallor, no icterus, moist oral mucosa, no cervical lymphadenopathy, prominent accessory muscles Cardiovascular: RRR, S1 normal, S2 normal, no MRG Chest: increased AP diameter noted,  Diminished bilateral breath sounds with scattered rhonchi, no wheeze or crackles Abdominal: Soft. Non-tender, non-distended, bowel sounds are normal,  Ext: warm, no edema Neurological: Alert and oriented  Labs on Admission:  Basic Metabolic Panel:  Recent Labs Lab 08/05/14 1003  NA 133*  K 4.1  CL 97  CO2 29  GLUCOSE 135*  BUN 7  CREATININE 0.57  CALCIUM 8.8   Liver  Function Tests:  Recent Labs Lab 08/05/14 1003  AST 29  ALT 20  ALKPHOS 92  BILITOT 0.7  PROT 6.9  ALBUMIN 4.3   No results for input(s): LIPASE, AMYLASE in the last 168 hours. No results for input(s): AMMONIA in the last 168 hours. CBC:  Recent Labs Lab 08/05/14 1003  WBC 5.9  HGB 14.3  HCT 43.4  MCV 96.9  PLT 154   Cardiac Enzymes: No results for input(s): CKTOTAL, CKMB, CKMBINDEX, TROPONINI in the last 168 hours. BNP: Invalid input(s): POCBNP CBG: No results for input(s): GLUCAP in the last 168 hours.  Radiological Exams on Admission: Ct Angio Chest Pe W/cm &/or Wo Cm  08/05/2014   CLINICAL DATA:  Difficulty breathing since yesterday. Back pain and cough.  EXAM: CT ANGIOGRAPHY CHEST WITH CONTRAST  TECHNIQUE: Multidetector CT imaging of the chest was performed using the standard protocol during bolus administration of intravenous contrast. Multiplanar CT image reconstructions and MIPs were obtained to evaluate the vascular anatomy.  CONTRAST:  32mL OMNIPAQUE IOHEXOL 350 MG/ML SOLN   COMPARISON:  Radiograph 08/05/2014  FINDINGS: Mediastinum/Nodes: No filling defects within pulmonary suggest acute pulmonary embolism. No acute findings aorta great vessels. Esophagus is normal. No pericardial fluid.  There is no axillary supraclavicular lymphadenopathy. No mediastinal adenopathy.  Review of the lung  Lungs/Pleura: There is extensive centrilobular emphysema. There is a pulmonary nodule at the medial right lung base measuring 10 mm (image 91, series 6).  Upper abdomen: Limited view of the liver, kidneys, pancreas are unremarkable. Normal adrenal glands.  Musculoskeletal: No aggressive osseous lesion.  IMPRESSION: 1. No acute pulmonary embolism. 2. Centrilobular emphysema in the upper lobes. 3. Right lower lobe pulmonary nodule measures 10 mm. Recommend further evaluation with FDG PET scan versus followup CT in 3 months.   Electronically Signed   By: Suzy Bouchard M.D.   On: 08/05/2014 16:28   Dg Chest Port 1 View  08/05/2014   CLINICAL DATA:  Shortness of breath  EXAM: PORTABLE CHEST - 1 VIEW  COMPARISON:  03/18/2013  FINDINGS: Cardiac shadow is stable. The lungs are again hyperinflated consistent with COPD. Small bilateral pleural effusions are. Would be difficult to exclude some underlying atelectasis.  IMPRESSION: COPD.  Small bilateral pleural effusions with likely basilar atelectasis.   Electronically Signed   By: Inez Catalina M.D.   On: 08/05/2014 10:19    EKG: Sinus rhythm at 90, no ST-T changes  Assessment/Plan  Principal Problem:   Acute respiratory failure with hypoxia Appears to be secondary to underlying COPD with acute bronchitis. Not diagnosed previously but history and imaging showing centrilobular emphysema does suggest so. Admit to telemetry. IV Solu-Medrol 60 mg every 6 hours. Scheduled one every 4 hours, albuterol nebs every 2 hours as needed. o2 via [facility]. Supportive care with antitussives. -Oral Levaquin 750 mg daily. -Check sputum culture -Patient strongly  counseled on smoking cessation. Will need pulmonary follow-up as outpatient.  Active Problems:    Tobacco use disorder Counseled on smoking cessation. Patient refused negative patch and wishes to quit on her own     Right lower lobe pulmonary nodule Seen on CT chest recommended PET scan versus follow-up CT in 3 months  Depression Continue Celexa  Back pain Place her on when necessary tramadol   Diet: Regular  DVT prophylaxis: sq lovenox   Code Status: full code Family Communication: None at bedside Disposition Plan: Admit to telemetry  Louellen Molder [facility] Pager [phone]  Total time spent on admission :70  minutes  If 7PM-7AM, please contact night-coverage [email or web address] Password [facility] 08/05/2014, 5:48 PM

## 2014-08-05 NOTE — Significant Event (Addendum)
Rapid Response Event Note Called to see pt for resp distress Overview: Time Called: 2142 Arrival Time: 2145 Event Type: Respiratory  Initial Focused Assessment: Pt is sitting upright in bed & appears very anxious. RR 25 Sats 98% 3L. She states she is trying to calm down because she knows it will help her breathing.  She is able to speak in full sentences though she cannot project her voice.  BBS with decreased air movement with faint wheezing appreciated.  Pt just received an albuterol & DuoNeb tx from RT. Discussed pt with Fredirick Maudlin, NP & orders received.  Will cont. To monitor    Interventions: Solumedrol 60mg  IV x1 Ativan 0.5mg  po x1  Event Summary: Name of Physician Notified: Fredirick Maudlin, NP at 2212    at          [facility], Courtney Ayers

## 2014-08-05 NOTE — ED Notes (Signed)
Pt walking to to bathroom with visitor.

## 2014-08-05 NOTE — Progress Notes (Signed)
RT note- Patient was given 5mg  albuterol nebulizer. Pre peak flow-150  Post peak flow-130

## 2014-08-05 NOTE — ED Notes (Signed)
Pt in from home c/o back pain between shoulder blades, pt c/o SOB onset yesterday,pt rcvd 125 mg Solumedrol, 10 mg Albuterol & 0.5 Atrovent, pt hx of [place], pt has audible wheezing & grunting respirations, pt speaks in short sentences,pt A&O x4

## 2014-08-05 NOTE — ED Provider Notes (Signed)
CSN: [id number]     Arrival date & time 08/05/14  4268 History   First MD Initiated Contact with Patient 08/05/14 0957     Chief Complaint  Patient presents with  . Shortness of Breath     HPI  Patient presents with ongoing dyspnea, back pain. Symptoms began yesterday, without clear precipitant. Since onset patient is a tightness between her shoulder blades, as well as worsening dyspnea in spite of using home albuterol therapy, humidified oxygen. No anterior chest pain, no sick B, near syncope. There is generalized fatigue, and dyspnea is worse with exertion.  Patient acknowledges a history of tobacco abuse, but denies any recent steroid use, hospitalizations. We discussed methods for smoking cessation and the importance of doing so to address her ongoing respiratory concerns.   Past Medical History  Diagnosis Date  . Allergic rhinitis, cause unspecified     seasonal  . Osteopenia   . OA (osteoarthritis)     B THUMB, LEFT HIP  . Adenomatous colon polyp   . Depressive disorder, not elsewhere classified 2006    some anxiety recurs when she has stopped med in past   Past Surgical History  Procedure Laterality Date  . Tonsillectomy and adenoidectomy  1951  . Nasal septum surgery  1987    for deviated septum (Dr. Wendy Poet)  . Breast enhancement surgery  1978    implants placed '78, removed and replaced  approx '95 (Dr. Wendy Poet)  . Cosmetic surgery      LIPOSUCTION; other facial cosmetic surgery  . Inguinial hernia repair  2004    right, and ganglion cyst removal  . Lipoma excision    . Ganglion cyst excision    . Colonoscopy  08/2011  . Melanoma excision  2013    R upper arm   Family History  Problem Relation Age of Onset  . Cancer Mother     bladder, metastatic  . Alcohol abuse Father   . Heart disease Father     CHF  . Hyperlipidemia Father   . Hypertension Father   . Aortic aneurysm Sister   . Crohn's disease Sister   . Diabetes Maternal Uncle   . Cancer  Maternal Grandmother 98    pancreatic  . Cancer Maternal Grandfather     bladder cancer in 8's  . Stroke Paternal Grandmother   . Heart disease Paternal Grandfather     MI later 70's, early 25's  . Hyperlipidemia Sister   . Cancer Sister     brain cancer in her 24's   History  Substance Use Topics  . Smoking status: Current Every Day Smoker -- 1.00 packs/day    Types: Cigarettes  . Smokeless tobacco: Never Used     Comment: starting to contemplate  . Alcohol Use: Yes     Comment: 1-2 drink per evening.   OB History    Gravida Para Term Preterm AB TAB SAB Ectopic Multiple Living   0 0 0 0 0 0 0 0 0 0      Review of Systems  Constitutional:       Per HPI, otherwise negative  HENT:       Per HPI, otherwise negative  Respiratory:       Per HPI, otherwise negative  Cardiovascular:       Per HPI, otherwise negative  Gastrointestinal: Negative for vomiting.  Endocrine:       Negative aside from HPI  Genitourinary:       Neg aside from HPI  Musculoskeletal:       Per HPI, otherwise negative  Skin: Negative.   Neurological: Negative for syncope.      Allergies  Review of patient's allergies indicates no known allergies.  Home Medications   Prior to Admission medications   Medication Sig Start Date End Date Taking? Authorizing Provider  b complex vitamins tablet Take 1 tablet by mouth daily.     Yes Historical Provider, MD  Calcium-Vitamin D-Vitamin K (VIACTIV) [phone] MG-UNT-MCG CHEW Chew 2 each by mouth daily.   Yes Historical Provider, MD  citalopram (CELEXA) 10 MG tablet take 1 tablet by mouth once daily 03/24/14  Yes [name], MD  guaiFENesin (MUCINEX) 600 MG 12 hr tablet Take 600 mg by mouth at bedtime.   Yes Historical Provider, MD  ibuprofen (ADVIL,MOTRIN) 200 MG tablet Take 400 mg by mouth every 6 (six) hours as needed.   Yes Historical Provider, MD  PRESCRIPTION MEDICATION ASTHALIN 100MCG/DOSE INHALER  AS NEEDED PT GETS FROM [place]   Yes Historical  Provider, MD  vitamin B-12 (CYANOCOBALAMIN) 100 MCG tablet Take 100 mcg by mouth daily.    Yes Historical Provider, MD  vitamin C (ASCORBIC ACID) 500 MG tablet Take 1,000 mg by mouth daily.     Yes Historical Provider, MD  vitamin E 400 UNIT capsule Take 400 Units by mouth daily.   Yes Historical Provider, MD  fluticasone (FLONASE) 50 MCG/ACT nasal spray Place 2 sprays into the nose daily. 02/14/12 03/24/14  Rita Ohara, MD   BP 126/78 mmHg  Pulse 100  Temp(Src) 97.7 F (36.5 C) (Oral)  Resp 28  SpO2 95%  LMP 06/10/1992 Physical Exam  Constitutional: She is oriented to person, place, and time. She appears well-developed and well-nourished. No distress.  HENT:  Head: Normocephalic and atraumatic.  Eyes: Conjunctivae and EOM are normal.  Cardiovascular: Normal rate and regular rhythm.   Pulmonary/Chest: Effort normal. No accessory muscle usage or stridor. No respiratory distress. She has no decreased breath sounds. She has no wheezes.  Quite breath sounds throughout  Abdominal: She exhibits no distension.  Musculoskeletal: She exhibits no edema.  Minimal swelling symmetrically in both distal lower extremities.  Cap refill is appropriate.  Pulses are appreciable.  Neurological: She is alert and oriented to person, place, and time. No cranial nerve deficit.  Skin: Skin is warm and dry.  Psychiatric: She has a normal mood and affect.  Nursing note and vitals reviewed.   ED Course  Procedures (including critical care time) Labs Review Labs Reviewed  COMPREHENSIVE METABOLIC PANEL - Abnormal; Notable for the following:    Sodium 133 (*)    Glucose, Bld 135 (*)    All other components within normal limits  D-DIMER, QUANTITATIVE - Abnormal; Notable for the following:    D-Dimer, Quant 0.70 (*)    All other components within normal limits  CBC  BRAIN NATRIURETIC PEPTIDE  I-STAT TROPOININ, ED    Imaging Review Dg Chest Port 1 View  08/05/2014   CLINICAL DATA:  Shortness of breath   EXAM: PORTABLE CHEST - 1 VIEW  COMPARISON:  03/18/2013  FINDINGS: Cardiac shadow is stable. The lungs are again hyperinflated consistent with COPD. Small bilateral pleural effusions are. Would be difficult to exclude some underlying atelectasis.  IMPRESSION: COPD.  Small bilateral pleural effusions with likely basilar atelectasis.   Electronically Signed   By: Inez Catalina M.D.   On: 08/05/2014 10:19     EKG Interpretation   Date/Time:  Friday August 05 2014 09:59:53 EST Ventricular Rate:  90 PR Interval:  183 QRS Duration: 91 QT Interval:  384 QTC Calculation: 470 R Axis:   84 Text Interpretation:  Sinus rhythm Right atrial enlargement Borderline  right axis deviation RSR' in V1 or V2, probably normal variant Nonspecific  T abnrm, anterolateral leads Artifact in lead(s) I II aVF V1 V3 V4 V5 V6  and baseline wander in lead(s) II III aVF Sinus rhythm Artifact Abnormal  ekg Confirmed by Carmin Muskrat  MD ([id number]) on 08/05/2014 11:20:37 AM     After one neb she feels better.  Update: Patient awake and alert, in no distress, sitting upright.  We discussed all findings again at length. No new complaints but Patient will follow up with pulmonology given her persistent difficulty.  MDM   Patient presents with ongoing cough, dyspnea. Here the patient is awake and alert, afebrile, with no leukocytosis, no evidence for bacteremia or sepsis. Patient's x-ray was unremarkable, labs unremarkable aside from elevated D dimer. Follow-up CT did not demonstrate pulmonary moles, but there was a pulmonary nodule. Patient will follow up with her primary care physician in this regard. With no evidence for substantial infectious process, coronary ischemia, or imminent decompensation, patient was discharged in stable condition.     Carmin Muskrat, MD 08/05/14 [phone]

## 2014-08-06 LAB — GLUCOSE, CAPILLARY
GLUCOSE-CAPILLARY: 129 mg/dL — AB (ref 70–99)
Glucose-Capillary: 133 mg/dL — ABNORMAL HIGH (ref 70–99)

## 2014-08-06 LAB — COMPREHENSIVE METABOLIC PANEL
ALK PHOS: 76 U/L (ref 39–117)
ALT: 23 U/L (ref 0–35)
ANION GAP: 6 (ref 5–15)
AST: 31 U/L (ref 0–37)
Albumin: 3.8 g/dL (ref 3.5–5.2)
BILIRUBIN TOTAL: 0.6 mg/dL (ref 0.3–1.2)
BUN: 9 mg/dL (ref 6–23)
CALCIUM: 8.9 mg/dL (ref 8.4–10.5)
CHLORIDE: 100 mmol/L (ref 96–112)
CO2: 34 mmol/L — ABNORMAL HIGH (ref 19–32)
CREATININE: 0.71 mg/dL (ref 0.50–1.10)
GFR calc non Af Amer: 86 mL/min — ABNORMAL LOW (ref >90)
Glucose, Bld: 137 mg/dL — ABNORMAL HIGH (ref 70–99)
POTASSIUM: 4.4 mmol/L (ref 3.5–5.1)
SODIUM: 140 mmol/L (ref 135–145)
TOTAL PROTEIN: 6.5 g/dL (ref 6.0–8.3)

## 2014-08-06 MED ORDER — LORAZEPAM 0.5 MG PO TABS
0.5000 mg | ORAL_TABLET | Freq: Three times a day (TID) | ORAL | Status: DC | PRN
  Administered 2014-08-06 – 2014-08-07 (×3): 0.5 mg via ORAL
  Filled 2014-08-06 (×3): qty 1

## 2014-08-06 MED ORDER — HYDROCODONE-HOMATROPINE 5-1.5 MG/5ML PO SYRP: 5.0000 mL | ORAL_SOLUTION | ORAL | Status: DC | PRN

## 2014-08-06 MED ORDER — GUAIFENESIN ER 600 MG PO TB12
600.0000 mg | ORAL_TABLET | Freq: Two times a day (BID) | ORAL | Status: DC
  Administered 2014-08-06 – 2014-08-07 (×3): 600 mg via ORAL
  Filled 2014-08-06 (×4): qty 1

## 2014-08-06 MED ORDER — AZITHROMYCIN 500 MG PO TABS
500.0000 mg | ORAL_TABLET | Freq: Every day | ORAL | Status: DC
  Administered 2014-08-06: 500 mg via ORAL
  Filled 2014-08-06: qty 1

## 2014-08-06 MED ORDER — HYDROCODONE-HOMATROPINE 5-1.5 MG/5ML PO SYRP
10.0000 mL | ORAL_SOLUTION | Freq: Three times a day (TID) | ORAL | Status: DC
  Administered 2014-08-06 – 2014-08-07 (×4): 10 mL via ORAL
  Filled 2014-08-06 (×4): qty 10

## 2014-08-06 MED ORDER — FUROSEMIDE 10 MG/ML IJ SOLN
20.0000 mg | Freq: Once | INTRAMUSCULAR | Status: AC
  Administered 2014-08-06: 20 mg via INTRAVENOUS
  Filled 2014-08-06: qty 2

## 2014-08-06 NOTE — Progress Notes (Addendum)
Pt is grunting, very SOB, suggested to RN to have pt evaluated by RRT nurse

## 2014-08-06 NOTE — Progress Notes (Addendum)
[facility] PROGRESS NOTE  Courtney Ayers GGE:[id number] DOB: 1946-03-01 DOA: 08/05/2014 PCP: Vikki Ports, MD  Assessment/Plan: Principal Problem:   Acute respiratory failure with hypoxia Active Problems:   Anxiety state   Tobacco use disorder   Dyspnea   Acute bronchitis   COPD (chronic obstructive pulmonary disease) with acute bronchitis    Acute respiratory failure with hypoxia Appears to be secondary to underlying COPD with acute bronchitis. Not diagnosed previously but history and imaging showing centrilobular emphysema does suggest so. Continue telemetry IV Solu-Medrol 60 mg every 6 hours. Scheduled one every 4 hours, albuterol nebs every 2 hours as needed. o2 via [place]. DC Robitussin, switch to hydrocodone Continue Levaquin 750 mg daily. 7 days -Check sputum culture -Patient strongly counseled on smoking cessation. Will need pulmonary follow-up as outpatient. Check influenza PCR Also received Lasix 20 mg 1 dose early this morning   Tobacco use disorder Counseled on smoking cessation. Patient refused negative patch and wishes to quit on her own  Anxiety Started the patient on lorazepam when necessary  Right lower lobe pulmonary nodule Seen on CT chest recommended PET scan versus follow-up CT in 3 months Patient is unaware and this was confirmed today  Depression Continue Celexa  Back pain Place her on when necessary tramadol  DVT prophylaxis-Lovenox  Code Status: full Family Communication: family updated about patient's clinical progress Disposition Plan: DC home and symptomatically improved    Brief narrative: HPI:  69 year old female with history of over 40-pack-year smoking, depression presented to the ED with progressive cough with productive sputum and dyspnea on exertion . She reports that she has been having progressive shortness of breath for the past 1 year associated with cough with productive sputum and most of the days . This is started  to get worse over the past 1-2 weeks . She now reports dyspnea on minimal exertion and not improved with her home inhaler. She does not have a diagnosis of COPD and has not seen a pulmonologist. Reports back pain. Patient denies headache, dizziness, fever, chills, nausea , vomiting, chest pain, palpitations, abdominal pain, bowel or urinary symptoms. Denies change in weight or appetite. She denies any recent illness, sick contacts or recent travel  Course in the ED Patient was mildly tachycardic, tachypneic and had dropping O2 sat were 83% on room air improved to high 90s on 3 L via nasal cannula. CBC was normal, chemistry showed sodium of 133, potassium 4.1, normal renal function. Chest x-ray was unremarkable. CT angiogram of the chest was done given mildly elevated d-dimer and was negative for PE but showed centrilobular emphysema in the upper lobes. Also showed an incidental finding of right lower lobe pulmonary nodule measuring 10 mm. Patient was given albuterol neb X2, 1 L IV normal saline and 500 mg azithromycin hospitalists admission requested to telemetry.   Consultants:  None  Procedures:  None  Antibiotics: Levofloxacin  HPI/Subjective: Patient complaining of chest tightness and still coughing a lot  Objective: Filed Vitals:   08/05/14 1815 08/05/14 2004 08/06/14 0514 08/06/14 0821  BP:  132/68 120/59   Pulse: 100 85 105   Temp:  98 F (36.7 C) 98.1 F (36.7 C)   TempSrc:  Oral Oral   Resp: 27 23 20    Height:  5\' 8"  (1.727 m)    Weight:  63.5 kg (139 lb 15.9 oz)    SpO2: 94% 100% 100% 99%    Intake/Output Summary (Last 24 hours) at 08/06/14 1151 Last data filed at 08/05/14  2330  Gross per 24 hour  Intake    600 ml  Output      0 ml  Net    600 ml    Exam:  General: No acute respiratory distress Lungs: By basilar wheezing or crackles Cardiovascular: Regular rate and rhythm without murmur gallop or rub normal S1 and S2 Abdomen: Nontender, nondistended,  soft, bowel sounds positive, no rebound, no ascites, no appreciable mass Extremities: No significant cyanosis, clubbing, or edema bilateral lower extremities      Data Reviewed: Basic Metabolic Panel:  Recent Labs Lab 08/05/14 1003  NA 133*  K 4.1  CL 97  CO2 29  GLUCOSE 135*  BUN 7  CREATININE 0.57  CALCIUM 8.8    Liver Function Tests:  Recent Labs Lab 08/05/14 1003  AST 29  ALT 20  ALKPHOS 92  BILITOT 0.7  PROT 6.9  ALBUMIN 4.3   No results for input(s): LIPASE, AMYLASE in the last 168 hours. No results for input(s): AMMONIA in the last 168 hours.  CBC:  Recent Labs Lab 08/05/14 1003  WBC 5.9  HGB 14.3  HCT 43.4  MCV 96.9  PLT 154    Cardiac Enzymes: No results for input(s): CKTOTAL, CKMB, CKMBINDEX, TROPONINI in the last 168 hours. BNP (last 3 results)  Recent Labs  08/05/14 1003  BNP 44.5    ProBNP (last 3 results) No results for input(s): PROBNP in the last 8760 hours.    CBG:  Recent Labs Lab 08/06/14 0818  GLUCAP 129*    No results found for this or any previous visit (from the past 240 hour(s)).   Studies: Ct Angio Chest Pe W/cm &/or Wo Cm  08/05/2014   CLINICAL DATA:  Difficulty breathing since yesterday. Back pain and cough.  EXAM: CT ANGIOGRAPHY CHEST WITH CONTRAST  TECHNIQUE: Multidetector CT imaging of the chest was performed using the standard protocol during bolus administration of intravenous contrast. Multiplanar CT image reconstructions and MIPs were obtained to evaluate the vascular anatomy.  CONTRAST:  43mL OMNIPAQUE IOHEXOL 350 MG/ML SOLN  COMPARISON:  Radiograph 08/05/2014  FINDINGS: Mediastinum/Nodes: No filling defects within pulmonary suggest acute pulmonary embolism. No acute findings aorta great vessels. Esophagus is normal. No pericardial fluid.  There is no axillary supraclavicular lymphadenopathy. No mediastinal adenopathy.  Review of the lung  Lungs/Pleura: There is extensive centrilobular emphysema. There is  a pulmonary nodule at the medial right lung base measuring 10 mm (image 91, series 6).  Upper abdomen: Limited view of the liver, kidneys, pancreas are unremarkable. Normal adrenal glands.  Musculoskeletal: No aggressive osseous lesion.  IMPRESSION: 1. No acute pulmonary embolism. 2. Centrilobular emphysema in the upper lobes. 3. Right lower lobe pulmonary nodule measures 10 mm. Recommend further evaluation with FDG PET scan versus followup CT in 3 months.   Electronically Signed   By: Suzy Bouchard M.D.   On: 08/05/2014 16:28   Dg Chest Port 1 View  08/05/2014   CLINICAL DATA:  Shortness of breath  EXAM: PORTABLE CHEST - 1 VIEW  COMPARISON:  03/18/2013  FINDINGS: Cardiac shadow is stable. The lungs are again hyperinflated consistent with COPD. Small bilateral pleural effusions are. Would be difficult to exclude some underlying atelectasis.  IMPRESSION: COPD.  Small bilateral pleural effusions with likely basilar atelectasis.   Electronically Signed   By: Inez Catalina M.D.   On: 08/05/2014 10:19    Scheduled Meds: . azithromycin  500 mg Oral Daily  . B-complex with vitamin C  1 tablet  Oral Daily  . calcium-vitamin D   Oral Daily  . citalopram  10 mg Oral Daily  . enoxaparin (LOVENOX) injection  40 mg Subcutaneous Q24H  . guaiFENesin  600 mg Oral BID  . HYDROcodone-homatropine  10 mL Oral TID  . ipratropium-albuterol  3 mL Nebulization Q4H  . levofloxacin  750 mg Oral Daily  . methylPREDNISolone (SOLU-MEDROL) injection  60 mg Intravenous 3 times per day  . sodium chloride  3 mL Intravenous Q12H  . vitamin B-12  100 mcg Oral Daily  . vitamin C  1,000 mg Oral Daily  . vitamin E  400 Units Oral Daily   Continuous Infusions:   Principal Problem:   Acute respiratory failure with hypoxia Active Problems:   Anxiety state   Tobacco use disorder   Dyspnea   Acute bronchitis   COPD (chronic obstructive pulmonary disease) with acute bronchitis    Time spent: 40  minutes   [facility] Pager [phone]. If 7PM-7AM, please contact night-coverage at [email or web address], password [facility] 08/06/2014, 11:51 AM  LOS: 1 day

## 2014-08-07 DIAGNOSIS — Z720 Tobacco use: Secondary | ICD-10-CM

## 2014-08-07 DIAGNOSIS — J9601 Acute respiratory failure with hypoxia: Secondary | ICD-10-CM

## 2014-08-07 DIAGNOSIS — R911 Solitary pulmonary nodule: Secondary | ICD-10-CM

## 2014-08-07 DIAGNOSIS — R0600 Dyspnea, unspecified: Secondary | ICD-10-CM

## 2014-08-07 DIAGNOSIS — J441 Chronic obstructive pulmonary disease with (acute) exacerbation: Secondary | ICD-10-CM | POA: Insufficient documentation

## 2014-08-07 LAB — BLOOD GAS, ARTERIAL
Acid-Base Excess: 9.1 mmol/L — ABNORMAL HIGH (ref 0.0–2.0)
Bicarbonate: 35.2 mEq/L — ABNORMAL HIGH (ref 20.0–24.0)
DRAWN BY: 10006
Delivery systems: POSITIVE
Expiratory PAP: 5
FIO2: 0.5 %
Inspiratory PAP: 15
O2 Saturation: 99.1 %
PATIENT TEMPERATURE: 98.6
PCO2 ART: 69.6 mmHg — AB (ref 35.0–45.0)
TCO2: 37.3 mmol/L (ref 0–100)
pH, Arterial: 7.324 — ABNORMAL LOW (ref 7.350–7.450)
pO2, Arterial: 176 mmHg — ABNORMAL HIGH (ref 80.0–100.0)

## 2014-08-07 LAB — CBC
HCT: 42 % (ref 36.0–46.0)
HEMOGLOBIN: 13.2 g/dL (ref 12.0–15.0)
MCH: 32 pg (ref 26.0–34.0)
MCHC: 31.4 g/dL (ref 30.0–36.0)
MCV: 101.7 fL — ABNORMAL HIGH (ref 78.0–100.0)
Platelets: 162 10*3/uL (ref 150–400)
RBC: 4.13 MIL/uL (ref 3.87–5.11)
RDW: 13.9 % (ref 11.5–15.5)
WBC: 11.4 10*3/uL — ABNORMAL HIGH (ref 4.0–10.5)

## 2014-08-07 LAB — MRSA PCR SCREENING: MRSA by PCR: NEGATIVE

## 2014-08-07 LAB — GLUCOSE, CAPILLARY: Glucose-Capillary: 186 mg/dL — ABNORMAL HIGH (ref 70–99)

## 2014-08-07 LAB — INFLUENZA PANEL BY PCR (TYPE A & B)
H1N1 flu by pcr: NOT DETECTED
Influenza A By PCR: NEGATIVE
Influenza B By PCR: NEGATIVE

## 2014-08-07 MED ORDER — METHYLPREDNISOLONE SODIUM SUCC 125 MG IJ SOLR
80.0000 mg | Freq: Three times a day (TID) | INTRAMUSCULAR | Status: DC
  Administered 2014-08-07 – 2014-08-10 (×9): 80 mg via INTRAVENOUS
  Filled 2014-08-07 (×6): qty 1.28
  Filled 2014-08-07: qty 2
  Filled 2014-08-07 (×3): qty 1.28

## 2014-08-07 MED ORDER — ALBUTEROL (5 MG/ML) CONTINUOUS INHALATION SOLN
15.0000 mg/h | INHALATION_SOLUTION | RESPIRATORY_TRACT | Status: DC
  Administered 2014-08-07: 15 mg/h via RESPIRATORY_TRACT

## 2014-08-07 MED ORDER — INSULIN ASPART 100 UNIT/ML ~~LOC~~ SOLN
0.0000 [IU] | SUBCUTANEOUS | Status: DC
  Administered 2014-08-08: 2 [IU] via SUBCUTANEOUS
  Administered 2014-08-08 (×2): 3 [IU] via SUBCUTANEOUS
  Administered 2014-08-08 – 2014-08-09 (×5): 2 [IU] via SUBCUTANEOUS
  Administered 2014-08-09: 3 [IU] via SUBCUTANEOUS
  Administered 2014-08-10: 2 [IU] via SUBCUTANEOUS

## 2014-08-07 MED ORDER — ALBUTEROL SULFATE (2.5 MG/3ML) 0.083% IN NEBU
2.5000 mg | INHALATION_SOLUTION | Freq: Once | RESPIRATORY_TRACT | Status: AC
  Administered 2014-08-07: 2.5 mg via RESPIRATORY_TRACT

## 2014-08-07 MED ORDER — CETYLPYRIDINIUM CHLORIDE 0.05 % MT LIQD
7.0000 mL | Freq: Two times a day (BID) | OROMUCOSAL | Status: DC
  Administered 2014-08-08 – 2014-08-12 (×6): 7 mL via OROMUCOSAL

## 2014-08-07 MED ORDER — ALBUTEROL SULFATE (2.5 MG/3ML) 0.083% IN NEBU: 2.5000 mg | INHALATION_SOLUTION | RESPIRATORY_TRACT | Status: DC | PRN

## 2014-08-07 MED ORDER — CHLORHEXIDINE GLUCONATE 0.12 % MT SOLN
15.0000 mL | Freq: Two times a day (BID) | OROMUCOSAL | Status: DC
  Administered 2014-08-07 – 2014-08-12 (×9): 15 mL via OROMUCOSAL
  Filled 2014-08-07 (×12): qty 15

## 2014-08-07 MED ORDER — SODIUM CHLORIDE 0.9 % IV SOLN
INTRAVENOUS | Status: DC
  Administered 2014-08-07: 22:00:00 via INTRAVENOUS

## 2014-08-07 MED ORDER — IPRATROPIUM-ALBUTEROL 0.5-2.5 (3) MG/3ML IN SOLN
3.0000 mL | Freq: Four times a day (QID) | RESPIRATORY_TRACT | Status: DC
  Administered 2014-08-08 – 2014-08-12 (×18): 3 mL via RESPIRATORY_TRACT
  Filled 2014-08-07 (×18): qty 3

## 2014-08-07 MED ORDER — LORAZEPAM 2 MG/ML IJ SOLN
0.5000 mg | Freq: Once | INTRAMUSCULAR | Status: AC
  Administered 2014-08-07: 0.5 mg via INTRAVENOUS
  Filled 2014-08-07: qty 1

## 2014-08-07 NOTE — Significant Event (Addendum)
Rapid Response Event Note Called by RT and primary RN to see pt for resp distress after getting up to the bathroom Overview: Time Called: 2011 Arrival Time: 2014 Event Type: Respiratory  Initial Focused Assessment: On arrival to floor pt is sitting upright in bed with increased WOB.  RR 28, sats 97% on 3L.  BBS with diffuse wheezing &, using accessory muscles.  Neb tx started & pt initially appeared to improve then began to have increasing WOB without increasing RR.  Pt remained able to answer questions.  Spoke with Fredirick Maudlin NP & Dr. Alva Garnet & plan to tx pt on Bipap to ICU for CCM eval.   Interventions: Albuterol CAT  Ativan 0.5mg  IV  Event Summary: Name of Physician Notified: Fredirick Maudlin, NP at 2030    at          [facility], [place] [address] [place]

## 2014-08-07 NOTE — Progress Notes (Signed)
UR Completed.  [phone]  

## 2014-08-07 NOTE — Consult Note (Signed)
PULMONARY / CRITICAL CARE MEDICINE   Name: Courtney Ayers MRN: [id number] DOB: 01-Nov-1945    ADMISSION DATE:  08/05/2014 CONSULTATION DATE:  08/07/2014  REFERRING MD :  Janice Norrie  CHIEF COMPLAINT:  SOB  INITIAL PRESENTATION:  69 y.o. F admitted 2/26 for SOB due to COPD exacerbation.  On evening of 2/28, rapid response called after pt had significant respiratory distress following using restroom.  She was transferred to ICU and PCCM consulted.     STUDIES:  CTA chest 2/26 >>> neg for PE, centrilobular emphysema in upper lobes, RLL pulm nodule measuring 71mm. CXR 2/6 >>> COPD, small b/l effusions  SIGNIFICANT EVENTS: 2/26 - admit 2/28 - transfer to ICU, started on BiPAP   HISTORY OF PRESENT ILLNESS:  Pt is encephalopathic; therefore, this HPI is obtained from chart review. Courtney Ayers is a 69 y.o. F with PMH as outlined below.  She was admitted to [facility] 2/26 for SOB due to COPD exacerbation.  She was treated with IV steroids, abx, BD's.  On evening of 2/8, pt got up to use the bathroom and upon completion, had significant respiratory distress to the point that rapid response was called. Pt transferred to ICU and PCCM consulted.   PAST MEDICAL HISTORY :   has a past medical history of Allergic rhinitis, cause unspecified; Osteopenia; OA (osteoarthritis); Adenomatous colon polyp; and Depressive disorder, not elsewhere classified (2006).  has past surgical history that includes Tonsillectomy and adenoidectomy (1951); Nasal septum surgery (1987); Breast enhancement surgery (1978); Cosmetic surgery; INGUINIAL HERNIA REPAIR (2004); Lipoma excision; Ganglion cyst excision; Colonoscopy (08/2011); and Melanoma excision (2013). Prior to Admission medications   Medication Sig Start Date End Date Taking? Authorizing Provider  b complex vitamins tablet Take 1 tablet by mouth daily.     Yes Historical Provider, MD  Calcium-Vitamin D-Vitamin K (VIACTIV) [phone] MG-UNT-MCG CHEW Chew 2 each by  mouth daily.   Yes Historical Provider, MD  citalopram (CELEXA) 10 MG tablet take 1 tablet by mouth once daily 03/24/14  Yes [name], MD  guaiFENesin (MUCINEX) 600 MG 12 hr tablet Take 600 mg by mouth at bedtime.   Yes Historical Provider, MD  ibuprofen (ADVIL,MOTRIN) 200 MG tablet Take 400 mg by mouth every 6 (six) hours as needed for mild pain.    Yes Historical Provider, MD  PRESCRIPTION MEDICATION Inhale 1 spray into the lungs as needed (wheezing, shortness of breath). ASTHALIN 100MCG/DOSE INHALER  AS NEEDED PT GETS FROM [place]   Yes Historical Provider, MD  vitamin B-12 (CYANOCOBALAMIN) 100 MCG tablet Take 100 mcg by mouth daily.    Yes Historical Provider, MD  vitamin C (ASCORBIC ACID) 500 MG tablet Take 1,000 mg by mouth daily.     Yes Historical Provider, MD  vitamin E 400 UNIT capsule Take 400 Units by mouth daily.   Yes Historical Provider, MD  azithromycin (ZITHROMAX Z-PAK) 250 MG tablet Take 1 tablet (250 mg total) by mouth daily. 08/06/14 08/09/14  Carmin Muskrat, MD  fluticasone (FLONASE) 50 MCG/ACT nasal spray Place 2 sprays into the nose daily. Patient not taking: Reported on 08/05/2014 02/14/12 03/24/14  Rita Ohara, MD  predniSONE (DELTASONE) 20 MG tablet Take 3 tablets (60 mg total) by mouth daily. 08/06/14 08/09/14  Carmin Muskrat, MD   No Known Allergies  FAMILY HISTORY:  Family History  Problem Relation Age of Onset  . Cancer Mother     bladder, metastatic  . Alcohol abuse Father   . Heart disease Father  CHF  . Hyperlipidemia Father   . Hypertension Father   . Aortic aneurysm Sister   . Crohn's disease Sister   . Diabetes Maternal Uncle   . Cancer Maternal Grandmother 98    pancreatic  . Cancer Maternal Grandfather     bladder cancer in 38's  . Stroke Paternal Grandmother   . Heart disease Paternal Grandfather     MI later 22's, early 53's  . Hyperlipidemia Sister   . Cancer Sister     brain cancer in her 4's    SOCIAL HISTORY:  reports that she has  been smoking Cigarettes.  She has been smoking about 1.00 pack per day. She has never used smokeless tobacco. She reports that she drinks alcohol. She reports that she does not use illicit drugs.  REVIEW OF SYSTEMS:  Unable to obtain as pt is on BiPAP.  SUBJECTIVE:   VITAL SIGNS: Temp:  [97.6 F (36.4 C)-98.6 F (37 C)] 98.2 F (36.8 C) (02/28 2130) Pulse Rate:  [86-112] 107 (02/28 2215) Resp:  [18-26] 23 (02/28 2215) BP: (120-143)/(57-103) 140/90 mmHg (02/28 2215) SpO2:  [94 %-100 %] 100 % (02/28 2215) FiO2 (%):  [100 %] 100 % (02/28 2130) Weight:  [63 kg (138 lb 14.2 oz)-65.8 kg (145 lb 1 oz)] 63 kg (138 lb 14.2 oz) (02/28 2130) HEMODYNAMICS:   VENTILATOR SETTINGS: Vent Mode:  [-]  FiO2 (%):  [100 %] 100 % INTAKE / OUTPUT: Intake/Output      02/28 0701 - 02/29 0700   P.O. 1040   I.V. (mL/kg) 15 (0.2)   Total Intake(mL/kg) 1055 (16.7)   Urine (mL/kg/hr) 775 (0.8)   Stool 0 (0)   Total Output 775   Net +280         PHYSICAL EXAMINATION: General: Appears older than stated age, in mild distress. Neuro: A&O x 3, non-focal.  HEENT: [place]/AT. PERRL, sclerae anicteric. BiPAP in place. Cardiovascular: RRR, no M/R/G.  Lungs: Respirations even and labored with mild accessory muscle use and mild paradoxical respirations.  Diffuse wheezing throughout. Abdomen: BS x 4, soft, NT/ND.  Musculoskeletal: No gross deformities, no edema.  Skin: Intact, warm, no rashes.  LABS:  CBC  Recent Labs Lab 08/05/14 1003 08/07/14 0510  WBC 5.9 11.4*  HGB 14.3 13.2  HCT 43.4 42.0  PLT 154 162   Coag's No results for input(s): APTT, INR in the last 168 hours. BMET  Recent Labs Lab 08/05/14 1003 08/06/14 1115  NA 133* 140  K 4.1 4.4  CL 97 100  CO2 29 34*  BUN 7 9  CREATININE 0.57 0.71  GLUCOSE 135* 137*   Electrolytes  Recent Labs Lab 08/05/14 1003 08/06/14 1115  CALCIUM 8.8 8.9   Sepsis Markers No results for input(s): LATICACIDVEN, PROCALCITON, O2SATVEN in the last  168 hours. ABG No results for input(s): PHART, PCO2ART, PO2ART in the last 168 hours. Liver Enzymes  Recent Labs Lab 08/05/14 1003 08/06/14 1115  AST 29 31  ALT 20 23  ALKPHOS 92 76  BILITOT 0.7 0.6  ALBUMIN 4.3 3.8   Cardiac Enzymes No results for input(s): TROPONINI, PROBNP in the last 168 hours. Glucose  Recent Labs Lab 08/06/14 0818 08/06/14 1634 08/07/14 2120  GLUCAP 129* 133* 186*    Imaging No results found.   ASSESSMENT / PLAN:  PULMONARY A: Acute on chronic hypoxic and hypercarbic respiratory failure AECOPD - has never had PFT's, emphysema as shown on CTA 2/26  RLL pulmonary nodule measuring 76mm Tobacco use disorder P:  Continuous BiPAP - 4 hours on, 1 hour off. ABG now. Continue IV steroids, DuoNebs, Albuterol, abx (levaquin). Set up with pulmonary as an outpatient for PFT's and further management of what is likely advanced COPD. Smoking cessation.  CARDIOVASCULAR A:  Dyspnea - probably primarily related to chronic lung disease but need to r/o underlying heart failure  P:  F/u on TTE.  RENAL A:   No acute issues P:   NS @ KVO. BMP in AM.  GASTROINTESTINAL A:   Nutrition P:   NPO.  HEMATOLOGIC A:   VTE Prophylaxis P:  SCD's / Lovenox. CBC in AM.  INFECTIOUS A:   AECOPD P:   Sputum 2/28 >>> Abx: Levaquin, start date 2/26, day 3/7.  ENDOCRINE A:   Steroid induced hyperglycemia  P:   SSI.  NEUROLOGIC A:   Acute metabolic encephalopathy Depression / Anxiety P:   Will hold low dose Ativan; at risk for w/d, but greater harm given resp failure presently. Will re-address once respiratory status stabilizes. Hold outpatient celexa.  Family updated: None.  Interdisciplinary Family Meeting v Palliative Care Meeting:  Due by: 3/5. Sister is pt's preferred surrogate decision maker.  CC Time:  35 minutes.   Montey Hora, [facility] Pager: [phone]  or [phone] 08/07/2014, 10:25 PM  Medical screening examination/treatment/procedure(s) were performed by non-physician practitioner and as supervising physician I was immediately available for consultation/collaboration.  Luz Brazen, MD Pulmonary & Critical Care Medicine August 07, 2014, 11:33 PM

## 2014-08-07 NOTE — Consult Note (Signed)
Dictation #: [id number]

## 2014-08-07 NOTE — Progress Notes (Signed)
Pt placed on Bipap while on floor and transported to 1Q94 without complication.

## 2014-08-07 NOTE — Progress Notes (Signed)
Pt not moving air well at this time. Pt unable complete a couple word sentences. RN notified and MD being paged.

## 2014-08-07 NOTE — Progress Notes (Addendum)
[facility] PROGRESS NOTE  Courtney Ayers OIB:[id number] DOB: 1945-11-03 DOA: 08/05/2014 PCP: Vikki Ports, MD  Assessment/Plan: Principal Problem:   Acute respiratory failure with hypoxia Active Problems:   Anxiety state   Tobacco use disorder   Dyspnea   Acute bronchitis   COPD (chronic obstructive pulmonary disease) with acute bronchitis    Acute respiratory failure with hypoxia -Appears to be secondary to underlying COPD with acute bronchitis. Not diagnosed previously but history and imaging showing centrilobular emphysema does suggest so. -Continue telemetry -IV Solu-Medrol 60 mg every 8 hours. Scheduled one every 4 hours, albuterol nebs every 2 hours as needed. o2 via [place]. -Continue Levaquin 750 mg daily. 7 days -counseled on smoking cessation.  -influenza PCR pending. Also received Lasix 20 mg 1 dose  -no improvement, will consult Pulmonary.    Tobacco use disorder Counseled on smoking cessation. Patient refused negative patch and wishes to quit on her own  Anxiety Started the patient on lorazepam when necessary  Right lower lobe pulmonary nodule Seen on CT chest recommended PET scan versus follow-up CT in 3 months Patient understand that she needs follow up for this.   Depression Continue Celexa  Back pain Place her on when necessary tramadol  DVT prophylaxis-Lovenox  Code Status: full Family Communication: family updated about patient's clinical progress Disposition Plan: DC home and symptomatically improved    Brief narrative: HPI:  69 year old female with history of over 40-pack-year smoking, depression presented to the ED with progressive cough with productive sputum and dyspnea on exertion . She reports that she has been having progressive shortness of breath for the past 1 year associated with cough with productive sputum and most of the days . This is started to get worse over the past 1-2 weeks . She now reports dyspnea on minimal exertion  and not improved with her home inhaler. She does not have a diagnosis of COPD and has not seen a pulmonologist. Reports back pain. Patient denies headache, dizziness, fever, chills, nausea , vomiting, chest pain, palpitations, abdominal pain, bowel or urinary symptoms. Denies change in weight or appetite. She denies any recent illness, sick contacts or recent travel  Course in the ED Patient was mildly tachycardic, tachypneic and had dropping O2 sat were 83% on room air improved to high 90s on 3 L via nasal cannula. CBC was normal, chemistry showed sodium of 133, potassium 4.1, normal renal function. Chest x-ray was unremarkable. CT angiogram of the chest was done given mildly elevated d-dimer and was negative for PE but showed centrilobular emphysema in the upper lobes. Also showed an incidental finding of right lower lobe pulmonary nodule measuring 10 mm. Patient was given albuterol neb X2, 1 L IV normal saline and 500 mg azithromycin hospitalists admission requested to telemetry.   Consultants:  None  Procedures:  None  Antibiotics: Levofloxacin  HPI/Subjective: Not feeling better, still with SOB, cough.    Objective: Filed Vitals:   08/06/14 1512 08/06/14 1706 08/06/14 2016 08/07/14 0415  BP:  124/62 138/76 133/77  Pulse:  90 93 92  Temp:  98.2 F (36.8 C) 97.7 F (36.5 C) 97.6 F (36.4 C)  TempSrc:  Oral Oral Oral  Resp:  20 18 18   Height:      Weight:   63.3 kg (139 lb 8.8 oz)   SpO2: 99% 98% 99% 94%    Intake/Output Summary (Last 24 hours) at 08/07/14 1029 Last data filed at 08/07/14 0419  Gross per 24 hour  Intake  600 ml  Output   1400 ml  Net   -800 ml    Exam:  General: No acute respiratory distress Lungs: Bilateral  wheezing or crackles Cardiovascular: Regular rate and rhythm without murmur gallop or rub normal S1 and S2 Abdomen: Nontender, nondistended, soft, bowel sounds positive, no rebound, no ascites, no appreciable mass Extremities: No  significant cyanosis, clubbing, or edema bilateral lower extremities      Data Reviewed: Basic Metabolic Panel:  Recent Labs Lab 08/05/14 1003 08/06/14 1115  NA 133* 140  K 4.1 4.4  CL 97 100  CO2 29 34*  GLUCOSE 135* 137*  BUN 7 9  CREATININE 0.57 0.71  CALCIUM 8.8 8.9    Liver Function Tests:  Recent Labs Lab 08/05/14 1003 08/06/14 1115  AST 29 31  ALT 20 23  ALKPHOS 92 76  BILITOT 0.7 0.6  PROT 6.9 6.5  ALBUMIN 4.3 3.8   No results for input(s): LIPASE, AMYLASE in the last 168 hours. No results for input(s): AMMONIA in the last 168 hours.  CBC:  Recent Labs Lab 08/05/14 1003 08/07/14 0510  WBC 5.9 11.4*  HGB 14.3 13.2  HCT 43.4 42.0  MCV 96.9 101.7*  PLT 154 162    Cardiac Enzymes: No results for input(s): CKTOTAL, CKMB, CKMBINDEX, TROPONINI in the last 168 hours. BNP (last 3 results)  Recent Labs  08/05/14 1003  BNP 44.5    ProBNP (last 3 results) No results for input(s): PROBNP in the last 8760 hours.    CBG:  Recent Labs Lab 08/06/14 0818 08/06/14 1634  GLUCAP 129* 133*    No results found for this or any previous visit (from the past 240 hour(s)).   Studies: Ct Angio Chest Pe W/cm &/or Wo Cm  08/05/2014   CLINICAL DATA:  Difficulty breathing since yesterday. Back pain and cough.  EXAM: CT ANGIOGRAPHY CHEST WITH CONTRAST  TECHNIQUE: Multidetector CT imaging of the chest was performed using the standard protocol during bolus administration of intravenous contrast. Multiplanar CT image reconstructions and MIPs were obtained to evaluate the vascular anatomy.  CONTRAST:  10mL OMNIPAQUE IOHEXOL 350 MG/ML SOLN  COMPARISON:  Radiograph 08/05/2014  FINDINGS: Mediastinum/Nodes: No filling defects within pulmonary suggest acute pulmonary embolism. No acute findings aorta great vessels. Esophagus is normal. No pericardial fluid.  There is no axillary supraclavicular lymphadenopathy. No mediastinal adenopathy.  Review of the lung   Lungs/Pleura: There is extensive centrilobular emphysema. There is a pulmonary nodule at the medial right lung base measuring 10 mm (image 91, series 6).  Upper abdomen: Limited view of the liver, kidneys, pancreas are unremarkable. Normal adrenal glands.  Musculoskeletal: No aggressive osseous lesion.  IMPRESSION: 1. No acute pulmonary embolism. 2. Centrilobular emphysema in the upper lobes. 3. Right lower lobe pulmonary nodule measures 10 mm. Recommend further evaluation with FDG PET scan versus followup CT in 3 months.   Electronically Signed   By: Suzy Bouchard M.D.   On: 08/05/2014 16:28   Dg Chest Port 1 View  08/05/2014   CLINICAL DATA:  Shortness of breath  EXAM: PORTABLE CHEST - 1 VIEW  COMPARISON:  03/18/2013  FINDINGS: Cardiac shadow is stable. The lungs are again hyperinflated consistent with COPD. Small bilateral pleural effusions are. Would be difficult to exclude some underlying atelectasis.  IMPRESSION: COPD.  Small bilateral pleural effusions with likely basilar atelectasis.   Electronically Signed   By: Inez Catalina M.D.   On: 08/05/2014 10:19    Scheduled Meds: . B-complex with  vitamin C  1 tablet Oral Daily  . calcium-vitamin D   Oral Daily  . citalopram  10 mg Oral Daily  . enoxaparin (LOVENOX) injection  40 mg Subcutaneous Q24H  . guaiFENesin  600 mg Oral BID  . HYDROcodone-homatropine  10 mL Oral TID  . ipratropium-albuterol  3 mL Nebulization Q4H  . levofloxacin  750 mg Oral Daily  . methylPREDNISolone (SOLU-MEDROL) injection  60 mg Intravenous 3 times per day  . sodium chloride  3 mL Intravenous Q12H  . vitamin B-12  100 mcg Oral Daily  . vitamin C  1,000 mg Oral Daily  . vitamin E  400 Units Oral Daily   Continuous Infusions:   Principal Problem:   Acute respiratory failure with hypoxia Active Problems:   Anxiety state   Tobacco use disorder   Dyspnea   Acute bronchitis   COPD (chronic obstructive pulmonary disease) with acute bronchitis    Time spent:  40 minutes   [place], [facility] Pager [phone]. If 7PM-7AM, please contact night-coverage at [email or web address], password [facility] 08/07/2014, 10:29 AM  LOS: 2 days

## 2014-08-07 NOTE — Progress Notes (Signed)
Upon arrival for scheduled breathing tx pt was sitting up in bed with increased WOB. Started pt on Duoneb and notified RN and rapid response. Pt was given 2 consecutive 2.5mg  albuterol nebs. Order for 15mg  neb received and administered. RT pulled albuterol nebs equivalent to 15mg  CAT due to emergent situation.

## 2014-08-08 ENCOUNTER — Inpatient Hospital Stay (HOSPITAL_COMMUNITY): Payer: Medicare Other

## 2014-08-08 DIAGNOSIS — J208 Acute bronchitis due to other specified organisms: Secondary | ICD-10-CM

## 2014-08-08 DIAGNOSIS — R0600 Dyspnea, unspecified: Secondary | ICD-10-CM

## 2014-08-08 DIAGNOSIS — R079 Chest pain, unspecified: Secondary | ICD-10-CM | POA: Insufficient documentation

## 2014-08-08 DIAGNOSIS — R071 Chest pain on breathing: Secondary | ICD-10-CM

## 2014-08-08 LAB — CBC
HEMATOCRIT: 39.7 % (ref 36.0–46.0)
Hemoglobin: 12.6 g/dL (ref 12.0–15.0)
MCH: 31.7 pg (ref 26.0–34.0)
MCHC: 31.7 g/dL (ref 30.0–36.0)
MCV: 99.7 fL (ref 78.0–100.0)
Platelets: 152 10*3/uL (ref 150–400)
RBC: 3.98 MIL/uL (ref 3.87–5.11)
RDW: 13.9 % (ref 11.5–15.5)
WBC: 11.6 10*3/uL — ABNORMAL HIGH (ref 4.0–10.5)

## 2014-08-08 LAB — MAGNESIUM: Magnesium: 2.4 mg/dL (ref 1.5–2.5)

## 2014-08-08 LAB — BASIC METABOLIC PANEL
Anion gap: 3 — ABNORMAL LOW (ref 5–15)
BUN: 14 mg/dL (ref 6–23)
CO2: 42 mmol/L (ref 19–32)
CREATININE: 0.66 mg/dL (ref 0.50–1.10)
Calcium: 9 mg/dL (ref 8.4–10.5)
Chloride: 91 mmol/L — ABNORMAL LOW (ref 96–112)
GFR calc Af Amer: >90 mL/min (ref >90)
GFR, EST NON AFRICAN AMERICAN: 88 mL/min — AB (ref >90)
GLUCOSE: 131 mg/dL — AB (ref 70–99)
Potassium: 4.2 mmol/L (ref 3.5–5.1)
Sodium: 136 mmol/L (ref 135–145)

## 2014-08-08 LAB — GLUCOSE, CAPILLARY
GLUCOSE-CAPILLARY: 136 mg/dL — AB (ref 70–99)
GLUCOSE-CAPILLARY: 158 mg/dL — AB (ref 70–99)
Glucose-Capillary: 115 mg/dL — ABNORMAL HIGH (ref 70–99)
Glucose-Capillary: 122 mg/dL — ABNORMAL HIGH (ref 70–99)
Glucose-Capillary: 141 mg/dL — ABNORMAL HIGH (ref 70–99)
Glucose-Capillary: 155 mg/dL — ABNORMAL HIGH (ref 70–99)

## 2014-08-08 LAB — PROCALCITONIN: Procalcitonin: <0.1 ng/mL

## 2014-08-08 LAB — TROPONIN I

## 2014-08-08 LAB — TSH: TSH: 1.488 u[IU]/mL (ref 0.350–4.500)

## 2014-08-08 LAB — PHOSPHORUS: PHOSPHORUS: 3.9 mg/dL (ref 2.3–4.6)

## 2014-08-08 MED ORDER — MIDAZOLAM HCL 2 MG/2ML IJ SOLN
0.5000 mg | INTRAMUSCULAR | Status: DC | PRN
  Administered 2014-08-08 – 2014-08-09 (×2): 0.5 mg via INTRAVENOUS
  Filled 2014-08-08 (×2): qty 2

## 2014-08-08 MED ORDER — SALINE SPRAY 0.65 % NA SOLN
1.0000 | NASAL | Status: DC | PRN
  Administered 2014-08-09 (×3): 1 via NASAL
  Filled 2014-08-08: qty 44

## 2014-08-08 NOTE — Consult Note (Signed)
Courtney Ayers, MEDLEY NO.:  [id number]  MEDICAL RECORD NO.:  [id number]  LOCATION:  2M13C                        FACILITY:  [facility]  PHYSICIAN:  Kathee Delton, MD,FCCPDATE OF BIRTH:  1945-07-05  DATE OF CONSULTATION:  08/07/2014 DATE OF DISCHARGE:                                CONSULTATION   REFERRING PHYSICIAN:  [facility].  HISTORY OF PRESENT ILLNESS:  The patient is a 69 year old female, who I have been asked to see for acute respiratory failure and  COPD.  The patient has a long history of smoking, and continued to do so right up until her day of admission.  She was told about a year ago that she had COPD and was put on inhalers, but despite this has had progressive dyspnea on exertion over the last 1 year.  Approximately 6 months ago, she gives a history of dyspnea with less than 1 block, walking on flat ground at a moderate pace, and would get extremely winded going up 1 flight of stairs.  Her shortness of breath has been progressive over the year, and the last few weeks it has gotten to the point that she could not function, so she came to the emergency room.  She was found to be hypoxemic, although her chest x-ray did not show an acute  process.  She underwent a CT scan of her chest that showed no pulmonary embolus but had very severe emphysematous changes.  She also had a 10 mm right lower lobe nodule noted incidentally but no mediastinal lymphadenopathy. Since being in the hospital, she has been treated with aggressive bronchodilator as well as IV steroids, but has continued to have severe shortness of breath with  any movement within her room.  She currently is not having any significant cough or mucous production, and has not had worsening lower extremity edema.  She has not had any recent cardiac evaluation.  She thinks that she has had PFTs in the past but is unsure.  PAST MEDICAL HISTORY: 1. Significant for allergic rhinitis. 2. History  of osteopenia. 3. History of osteoarthritis. 4. History of depression. 5. History of melanoma excision. 6. History of present COPD.  SOCIAL HISTORY:  The patient has a history of smoking for 40 years  and continues to smoke right up to the day of admission.  She denies any illicit drug use.  FAMILY HISTORY:  Remarkable for bladder cancer and brain cancer, heart disease, hypertension, Crohn's disease, and diabetes.  ALLERGIES:  The patient has no known drug allergies.  REVIEW OF SYSTEMS:  A 10-point review of systems was unremarkable except for that listed in history of present illness.  PHYSICAL EXAMINATION:  GENERAL: She was a thin female, in no acute respiratory distress at rest. VITAL SIGNS:  Blood pressure is 133/77, pulse 92,  respirations 18.  She is afebrile.  O2 saturation on 3 L is 94%. HEENT:  Pupils equal, round, and reactive to light and accommodation. Extraocular muscles are intact.  Nares are patent without discharge. Oropharynx is clear. NECK: Supple without jugular venous distention or lymphadenopathy.  No palpable thyromegaly. CHEST:  Reveals very diminished breath sounds throughout but no active wheezing. CARDIAC:  Exam  had distant heart sounds but is regular, no murmurs noted. ABDOMEN:  Soft, nontender, and nondistended.  Good bowel sounds. GENITAL:  Rectal exam and breast exam was not done, not indicated. LOWER EXTREMITIES:  Without edema.  No cyanosis.  NEUROLOGIC:  She is alert and oriented and moves all 4 extremities.  IMPRESSION: 1. Acute respiratory failure probably secondary to a chronic     obstructive pulmonary disease exacerbation.  The patient has a long-     standing smoking history, severe emphysematous changes on CT chest,     and very quiet lung sounds on exam.  Even though she is not had     pulmonary function tests, I suspect she does have very significant     chronic obstructive pulmonary disease.  She does not feel that she     has  responded to aggressive treatment since being in the hospital,     and I am concerned that she may have reached a critical FEV1, which     will leave her severely disabled from this point going forward.  It     is unclear whether she will have further improvement with     continuation of aggressive treatment, or whether there may be     another superimposed process that we have to yet recognize.  We     have excluded a pulmonary embolus, but she has not had any type of     cardiac evaluation.  I would recommend that she had an     echocardiogram and possibly a stress test in the future.  I have     explained to the patient that this may be her new baseline going     forward, and there may be nothing further that we can do for her to     improve her dyspnea. 2. Right lower lobe pulmonary nodule that is  concerning for a     malignant process.  This will need a PET scan as an outpatient for     further workup.  SUGGESTIONS: 1. We will continue on aggressive bronchodilators and IV steroids for     now to see if she will have further improvement in the coming days. 2. Schedule for echocardiogram, and possibly a stress test as an     outpatient. 3. Would benefit from outpatient PFTs to better characterize the     severity of her lung disease. 4. The patient will need an outpatient PET scan for further evaluation     of her right lower lobe pulmonary nodules.     Kathee Delton, MD,FCCP     [facility]  D:  08/07/2014  T:  08/08/2014  Job:  [id number]

## 2014-08-08 NOTE — Progress Notes (Signed)
CRITICAL VALUE ALERT  Critical value received:  CO2-42  Date of notification:  08/08/2014  Time of notification: 5:21 AM  Critical value read back:Yes.    Nurse who received alert:  Dillard Essex  MD notified (1st page):  Dr. Nelda Marseille  Time of first page:  5:22 AM  MD notified (2nd page):  Time of second page:  Responding MD:  Dr. Nelda Marseille  Time MD responded:  5:22 AM

## 2014-08-08 NOTE — Consult Note (Signed)
PULMONARY / CRITICAL CARE MEDICINE   Name: Courtney Ayers MRN: [id number] DOB: 04/20/1946    ADMISSION DATE:  08/05/2014 CONSULTATION DATE:  08/08/2014  REFERRING MD :  Janice Norrie  CHIEF COMPLAINT:  SOB  INITIAL PRESENTATION:  69 y.o. F admitted 2/26 for SOB due to COPD exacerbation.  On evening of 2/28, rapid response called after pt had significant respiratory distress following using restroom.  She was transferred to ICU and PCCM consulted.     STUDIES:  CTA chest 2/26 >>> neg for PE, centrilobular emphysema in upper lobes, RLL pulm nodule measuring 68mm. CXR 2/26 >>> COPD, small b/l effusions  SIGNIFICANT EVENTS: 2/26 - admit 2/28 - transfer to ICU, started on BiPAP   SUBJECTIVE:  Patient reports no shortness of breath at rest. She has been having intermittent achy chest pain that radiates to her back. No lightheadedness or diaphoresis. Pain occurred during her shortness of breath last night as well.  VITAL SIGNS: Temp:  [97.7 F (36.5 C)-98.6 F (37 C)] 98.1 F (36.7 C) (02/29 0354) Pulse Rate:  [79-112] 86 (02/29 0732) Resp:  [12-26] 20 (02/29 0732) BP: (103-143)/(56-103) 118/72 mmHg (02/29 0732) SpO2:  [94 %-100 %] 99 % (02/29 0732) FiO2 (%):  [40 %-100 %] 40 % (02/29 0729) Weight:  [138 lb 14.2 oz (63 kg)-145 lb 1 oz (65.8 kg)] 138 lb 14.2 oz (63 kg) (02/28 2130) HEMODYNAMICS:   VENTILATOR SETTINGS: Vent Mode:  [-] BIPAP;PCV FiO2 (%):  [40 %-100 %] 40 % Set Rate:  [15 bmp] 15 bmp PEEP:  [5 cmH20] 5 cmH20 INTAKE / OUTPUT: Intake/Output      02/28 0701 - 02/29 0700 02/29 0701 - 03/01 0700   P.O. 1280    I.V. (mL/kg) 105 (1.7)    Total Intake(mL/kg) 1385 (22)    Urine (mL/kg/hr) 1325 (0.9)    Stool 0 (0)    Total Output 1325     Net +60            PHYSICAL EXAMINATION: General: No distress. Neuro: A&O x 3, non-focal.  HEENT: [place]/AT. PERRL, sclerae anicteric. BiPAP in place. Cardiovascular: RRR, no M/R/G.  Lungs: Decreased breath sounds diffusely,  wheezing heard in upper lung fields with transmitted rhonchorous sounds heard. Rhocherous sounds heard at level of larynx. Abdomen: BS x 4, soft, NT/ND.  Musculoskeletal: No gross deformities, no edema, left leg with cast Skin: Intact, warm, no rashes.  LABS:  CBC  Recent Labs Lab 08/05/14 1003 08/07/14 0510 08/08/14 0250  WBC 5.9 11.4* 11.6*  HGB 14.3 13.2 12.6  HCT 43.4 42.0 39.7  PLT 154 162 152   Coag's No results for input(s): APTT, INR in the last 168 hours. BMET  Recent Labs Lab 08/05/14 1003 08/06/14 1115 08/08/14 0250  NA 133* 140 136  K 4.1 4.4 4.2  CL 97 100 91*  CO2 29 34* 42*  BUN 7 9 14   CREATININE 0.57 0.71 0.66  GLUCOSE 135* 137* 131*   Electrolytes  Recent Labs Lab 08/05/14 1003 08/06/14 1115 08/08/14 0250  CALCIUM 8.8 8.9 9.0  MG  --   --  2.4  PHOS  --   --  3.9   Sepsis Markers No results for input(s): LATICACIDVEN, PROCALCITON, O2SATVEN in the last 168 hours. ABG  Recent Labs Lab 08/07/14 2335  PHART 7.324*  PCO2ART 69.6*  PO2ART 176.0*   Liver Enzymes  Recent Labs Lab 08/05/14 1003 08/06/14 1115  AST 29 31  ALT 20 23  ALKPHOS 92 76  BILITOT 0.7 0.6  ALBUMIN 4.3 3.8   Cardiac Enzymes No results for input(s): TROPONINI, PROBNP in the last 168 hours. Glucose  Recent Labs Lab 08/06/14 0818 08/06/14 1634 08/07/14 2120 08/07/14 2345 08/08/14 0353  GLUCAP 129* 133* 186* 155* 136*    Imaging No results found.   ASSESSMENT / PLAN:  PULMONARY A: Acute on chronic hypoxic and hypercarbic respiratory failure AECOPD - has never had PFT's, emphysema as shown on CTA 2/26  RLL pulmonary nodule measuring 5mm Tobacco use disorder P:   Change to scheduled BIPAP 4 hr on, 1 hr off Continue IV steroids, DuoNebs, Albuterol Maintain current Abx regimen Set up with pulmonary as an outpatient for PFT's and further management of what is likely advanced COPD. Smoking cessation. Will need outpt nodule follow up -  PET  CARDIOVASCULAR A:  Dyspnea - probably primarily related to chronic lung disease but need to r/o underlying heart failure  P:  F/u on TTE, assess pa pressures awaited EKG repeat now Troponin  RENAL A:   No acute issues P:   NS @ KVO, to increase if NPO maintain BMP in AM.  GASTROINTESTINAL A:   Nutrition P:   Diet started, no advance ppi not indicated as of now   HEMATOLOGIC A:   VTE Prophylaxis P:  SCD's / Lovenox CBC in AM  INFECTIOUS A:   AECOPD P:   Sputum 2/28 >>> Abx: Levaquin, start date 2/26, day 4/7. Pct algorithm, repeat pcxr - hope to dc all abx   ENDOCRINE A:   Steroid induced hyperglycemia  P:   SSI. Steroid top remain Ensure TSH  NEUROLOGIC A:   Acute metabolic encephalopathy Depression / Anxiety P:   Will hold low dose Ativan; at risk for w/d, but greater harm given resp failure presently. Will re-address once respiratory status stabilizes. Hold outpatient celexa  Family updated: None.  Interdisciplinary Family Meeting v Palliative Care Meeting:  Due by: 3/5. Sister is pt's preferred surrogate decision maker.  Cordelia Poche, MD PGY-2, [place] [facility] 08/08/2014, 8:54 AM   STAFF NOTE: Linwood Dibbles, MD FACP have personally reviewed patient's available data, including medical history, events of note, physical examination and test results as part of my evaluation. I have discussed with resident/NP and other care providers such as pharmacist, RN and RRT. In addition, I personally evaluated patient and elicited key findings IO:MBTDHRCB remains intermittent, schedule NIMV, maintain solumedrol current dose, limit diet, pct to liit abx, pcxr follow up, distant BS The patient is critically ill with multiple organ systems failure and requires high complexity decision making for assessment and support, frequent evaluation and titration of therapies, application of advanced monitoring technologies and extensive interpretation  of multiple databases.   Critical Care Time devoted to patient care services described in this note is30 Minutes. This time reflects time of care of this signee: Merrie Roof, MD FACP. This critical care time does not reflect procedure time, or teaching time or supervisory time of PA/NP/Med student/Med Resident etc but could involve care discussion time. Rest per NP/medical resident whose note is outlined above and that I agree with   Lavon Paganini. Titus Mould, MD, [place] Pgr: [facility] 08/08/2014 9:43 AM

## 2014-08-08 NOTE — Progress Notes (Signed)
  Echocardiogram 2D Echocardiogram has been performed.  Darlina Sicilian M 08/08/2014, 11:28 AM

## 2014-08-08 NOTE — Care Management Note (Signed)
    Page 1 of 2   08/12/2014     11:39:14 AM CARE MANAGEMENT NOTE 08/12/2014  Patient:  Courtney Ayers, Courtney Ayers   Account Number:  [id number]  Date Initiated:  08/08/2014  Documentation initiated by:  [facility]  Subjective/Objective Assessment:   Admitted with increased SOB -  Now 2-29 in ICU on bipap after being tx to ICU on 2-28 for increased resp distress.     Action/Plan:   Anticipated DC Date:  08/12/2014   Anticipated DC Plan:  [place]  CM consult      Choice offered to / List presented to:     DME arranged  [facility]      DME agency  [place].        Status of service:  In process, will continue to follow Medicare Important Message given?  YES (If response is "NO", the following Medicare IM given date fields will be blank) Date Medicare IM given:  08/08/2014 Medicare IM given by:  [facility], [place] Date Additional Medicare IM given:  08/11/2014 Additional Medicare IM given by:  Tomi Bamberger  Discharge Disposition:  HOME/SELF CARE  Per UR Regulation:  Reviewed for med. necessity/level of care/duration of stay  If discussed at [place] of Stay Meetings, dates discussed:    Comments:  ContactCharlynn, [place] Sister [phone]  08/12/14 [place], BSN [phone] patient is for dc today , she chose [facility] for home oxygen, Jermaine notified.  He will bring oxygen up to patient's room and facilitate home set up.  08-08-14 2:54pm Luz Lex, [place] [phone] Sitting up in bed, [place] style, with phone plugged in on bedside table and working on her Ipad.  Now on [place] oxygen. States lives at home alone, no DME or oxygen.  Plan is to return to that level.  May need home oxygen.  CM will continue to follow.

## 2014-08-08 NOTE — Progress Notes (Signed)
Patient sitting comfortably in bed during on coming shift rounds. Per pt, she got up to use the restroom and became short of breath once she returned to bed. Respiratory therapy came to give pt breathing treatment and found pt short of breath. Rapid response was called per respiratory and RN was notified to call MD. 80mg  Solu-medrol IV given early, per rapid response and MD verbal order. Respiratory therapy, rapid response RN and RN at patient bedside. Per rapid response evaluation, pt transferred to ICU [id number] with no complications. Report given to receiving RN at pt bedside.   Shelbie Hutching, RN

## 2014-08-09 DIAGNOSIS — J209 Acute bronchitis, unspecified: Secondary | ICD-10-CM

## 2014-08-09 LAB — BASIC METABOLIC PANEL
ANION GAP: 7 (ref 5–15)
BUN: 13 mg/dL (ref 6–23)
CHLORIDE: 94 mmol/L — AB (ref 96–112)
CO2: 37 mmol/L — AB (ref 19–32)
CREATININE: 0.58 mg/dL (ref 0.50–1.10)
Calcium: 8.9 mg/dL (ref 8.4–10.5)
GFR calc Af Amer: >90 mL/min (ref >90)
GFR calc non Af Amer: >90 mL/min (ref >90)
Glucose, Bld: 96 mg/dL (ref 70–99)
Potassium: 4.5 mmol/L (ref 3.5–5.1)
SODIUM: 138 mmol/L (ref 135–145)

## 2014-08-09 LAB — GLUCOSE, CAPILLARY
GLUCOSE-CAPILLARY: 126 mg/dL — AB (ref 70–99)
GLUCOSE-CAPILLARY: 129 mg/dL — AB (ref 70–99)
GLUCOSE-CAPILLARY: 95 mg/dL (ref 70–99)
Glucose-Capillary: 112 mg/dL — ABNORMAL HIGH (ref 70–99)
Glucose-Capillary: 135 mg/dL — ABNORMAL HIGH (ref 70–99)
Glucose-Capillary: 179 mg/dL — ABNORMAL HIGH (ref 70–99)
Glucose-Capillary: 88 mg/dL (ref 70–99)

## 2014-08-09 LAB — PROCALCITONIN: Procalcitonin: <0.1 ng/mL

## 2014-08-09 MED ORDER — CITALOPRAM HYDROBROMIDE 10 MG PO TABS
10.0000 mg | ORAL_TABLET | Freq: Every day | ORAL | Status: DC
  Administered 2014-08-09 – 2014-08-12 (×4): 10 mg via ORAL
  Filled 2014-08-09 (×5): qty 1

## 2014-08-09 MED ORDER — FLUTICASONE PROPIONATE 50 MCG/ACT NA SUSP
1.0000 | Freq: Every day | NASAL | Status: DC
  Administered 2014-08-09 – 2014-08-12 (×4): 1 via NASAL
  Filled 2014-08-09: qty 16

## 2014-08-09 NOTE — Plan of Care (Signed)
Problem: Problem: Respiratory Progression Goal: OTHER RESPIRATORY GOAL(S) Outcome: Not Progressing  Pt on 5 liter O2 labored breathing. Pt remains on bipap throughout night O2 sat above 92%

## 2014-08-09 NOTE — Progress Notes (Signed)
PULMONARY / CRITICAL CARE MEDICINE   Name: Courtney Ayers MRN: [id number] DOB: 1945/10/27    ADMISSION DATE:  08/05/2014 CONSULTATION DATE:  08/09/2014  REFERRING MD :  Janice Norrie  CHIEF COMPLAINT:  SOB  INITIAL PRESENTATION:  69 y.o. F admitted 2/26 for SOB due to COPD exacerbation.  On evening of 2/28, rapid response called after pt had significant respiratory distress following using restroom.  She was transferred to ICU and PCCM consulted.     STUDIES:  CTA chest 2/26 >>> neg for PE, centrilobular emphysema in upper lobes, RLL pulm nodule measuring 67mm. CXR 2/26 >>> COPD, small b/l effusions Echo 2/29>>>Technically difficult study with poor acoustic windows. Normal LV size with EF 60-65%. Mildly dilated RV with normal systolic function. No significant valvular abnormalities. Mild pulmonary hypertension. Dilated IVC suggestive of elevated RV filling pressure.  SIGNIFICANT EVENTS: 2/26 - admit 2/28 - transfer to ICU, started on BiPAP   SUBJECTIVE:  Less sob, off nIMV this am   VITAL SIGNS: Temp:  [97.3 F (36.3 C)-98.1 F (36.7 C)] 98.1 F (36.7 C) (03/01 0826) Pulse Rate:  [75-108] 87 (03/01 1127) Resp:  [7-30] 30 (03/01 1127) BP: (108-139)/(64-80) 139/77 mmHg (03/01 1127) SpO2:  [95 %-100 %] 100 % (03/01 1127) FiO2 (%):  [40 %] 40 % (03/01 0733) Weight:  [61.4 kg (135 lb 5.8 oz)] 61.4 kg (135 lb 5.8 oz) (03/01 0300) HEMODYNAMICS:   VENTILATOR SETTINGS: Vent Mode:  [-] BIPAP FiO2 (%):  [40 %] 40 % Set Rate:  [15 bmp] 15 bmp PEEP:  [5 cmH20] 5 cmH20 INTAKE / OUTPUT: Intake/Output      02/29 0701 - 03/01 0700 03/01 0701 - 03/02 0700   P.O. 270 470   I.V. (mL/kg) 243 (4) 40 (0.7)   Total Intake(mL/kg) 513 (8.4) 510 (8.3)   Urine (mL/kg/hr) 1625 (1.1) 325 (1)   Emesis/NG output 0 (0)    Stool     Total Output 1625 325   Net -1112 +185        Urine Occurrence 2 x 175 x   Emesis Occurrence 150 x      PHYSICAL EXAMINATION: General: No distress,  some purse lip breathing Neuro: A&O x 3, non-focal.  HEENT: [place]/AT. PERRL, sclerae anicteric. BiPAP off Cardiovascular: RRR, no M/R/G.  Lungs: reduced, better air entry over all Abdomen: BS x 4, soft, NT/ND.  Musculoskeletal: No gross deformities, no edema, left leg with cast Skin: Intact, warm, no rashes.  LABS:  CBC  Recent Labs Lab 08/05/14 1003 08/07/14 0510 08/08/14 0250  WBC 5.9 11.4* 11.6*  HGB 14.3 13.2 12.6  HCT 43.4 42.0 39.7  PLT 154 162 152   Coag's No results for input(s): APTT, INR in the last 168 hours. BMET  Recent Labs Lab 08/05/14 1003 08/06/14 1115 08/08/14 0250  NA 133* 140 136  K 4.1 4.4 4.2  CL 97 100 91*  CO2 29 34* 42*  BUN 7 9 14   CREATININE 0.57 0.71 0.66  GLUCOSE 135* 137* 131*   Electrolytes  Recent Labs Lab 08/05/14 1003 08/06/14 1115 08/08/14 0250  CALCIUM 8.8 8.9 9.0  MG  --   --  2.4  PHOS  --   --  3.9   Sepsis Markers  Recent Labs Lab 08/08/14 1140 08/09/14 0215  PROCALCITON <0.10 <0.10   ABG  Recent Labs Lab 08/07/14 2335  PHART 7.324*  PCO2ART 69.6*  PO2ART 176.0*   Liver Enzymes  Recent Labs Lab 08/05/14 1003 08/06/14 1115  AST  29 31  ALT 20 23  ALKPHOS 92 76  BILITOT 0.7 0.6  ALBUMIN 4.3 3.8   Cardiac Enzymes  Recent Labs Lab 08/08/14 1140  TROPONINI <0.03   Glucose  Recent Labs Lab 08/08/14 1316 08/08/14 1555 08/08/14 1923 08/08/14 2344 08/09/14 0416 08/09/14 0810  GLUCAP 141* 115* 158* 129* 135* 112*    Imaging Dg Chest Port 1 View  08/08/2014   CLINICAL DATA:  Chest pain and shortness of breath.  EXAM: PORTABLE CHEST - 1 VIEW  COMPARISON:  08/05/2014  FINDINGS: The heart size is normal. No pleural effusion or edema identified. No airspace consolidation noted. Lungs are hyperinflated and there are coarsened interstitial markings identified bilaterally. No airspace consolidation.  IMPRESSION: 1. No active cardiopulmonary abnormalities. 2. Emphysema.   Electronically Signed   By:  Kerby Moors M.D.   On: 08/08/2014 11:05     ASSESSMENT / PLAN:  PULMONARY A: Acute on chronic hypoxic and hypercarbic respiratory failure AECOPD - has never had PFT's, emphysema as shown on CTA 2/26  RLL pulmonary nodule measuring 76mm Tobacco use disorder P:   Change to bipap to prn Continue IV steroids reduce in am likely  ,DuoNebs, Albuterol Pct neg, low clinical suspicion, dc abx Will need outpt nodule follow up - PET Repeat pcxr in am Neg balance did well, consider repeat   CARDIOVASCULAR A:  Dyspnea - probably primarily related to chronic lung disease but need to r/o underlying heart failure  P:  Echo reviewed, rv noted, lasix if not neg on own Troponin neg  RENAL A:   Neg balance on own P:   NS @ KVO Lasix if not neg on own  GASTROINTESTINAL A:   Nutrition P:   Diet started, advance  HEMATOLOGIC A:   VTE Prophylaxis P:  SCD's / Lovenox until ambulation CBC in AM  INFECTIOUS A:   AECOPD P:   Sputum 2/28 >>> Abx: Levaquin, start date 2/26, day 4/7. Pct algorithm, neg clinically for infection - dc abx  ENDOCRINE A:   Steroid induced hyperglycemia  P:   SSI. Steroid top remain, reduce in am  Ensure TSH - wnl  NEUROLOGIC A:   Acute metabolic encephalopathy Depression / Anxiety P:   Restart outpatient celexa Prn versed  Family updated: None.  Interdisciplinary Family Meeting v Palliative Care Meeting:  Due by: 3/5. Sister is pt's preferred surrogate decision maker.  To sdu, remain pccm  Lavon Paganini. Titus Mould, MD, [place] Pgr: [facility] 08/09/2014 12:08 PM

## 2014-08-10 ENCOUNTER — Inpatient Hospital Stay (HOSPITAL_COMMUNITY): Payer: Medicare Other

## 2014-08-10 LAB — CBC WITH DIFFERENTIAL/PLATELET
BASOS ABS: 0 10*3/uL (ref 0.0–0.1)
Basophils Relative: 0 % (ref 0–1)
Eosinophils Absolute: 0 10*3/uL (ref 0.0–0.7)
Eosinophils Relative: 0 % (ref 0–5)
HCT: 42.9 % (ref 36.0–46.0)
HEMOGLOBIN: 13.7 g/dL (ref 12.0–15.0)
Lymphocytes Relative: 7 % — ABNORMAL LOW (ref 12–46)
Lymphs Abs: 0.6 10*3/uL — ABNORMAL LOW (ref 0.7–4.0)
MCH: 31.9 pg (ref 26.0–34.0)
MCHC: 31.9 g/dL (ref 30.0–36.0)
MCV: 100 fL (ref 78.0–100.0)
MONOS PCT: 5 % (ref 3–12)
Monocytes Absolute: 0.4 10*3/uL (ref 0.1–1.0)
NEUTROS PCT: 88 % — AB (ref 43–77)
Neutro Abs: 6.9 10*3/uL (ref 1.7–7.7)
PLATELETS: 181 10*3/uL (ref 150–400)
RBC: 4.29 MIL/uL (ref 3.87–5.11)
RDW: 13.4 % (ref 11.5–15.5)
WBC: 8 10*3/uL (ref 4.0–10.5)

## 2014-08-10 LAB — BASIC METABOLIC PANEL
ANION GAP: 8 (ref 5–15)
BUN: 16 mg/dL (ref 6–23)
CALCIUM: 8.8 mg/dL (ref 8.4–10.5)
CHLORIDE: 93 mmol/L — AB (ref 96–112)
CO2: 38 mmol/L — ABNORMAL HIGH (ref 19–32)
Creatinine, Ser: 0.65 mg/dL (ref 0.50–1.10)
GFR calc non Af Amer: 89 mL/min — ABNORMAL LOW (ref >90)
Glucose, Bld: 128 mg/dL — ABNORMAL HIGH (ref 70–99)
Potassium: 3.9 mmol/L (ref 3.5–5.1)
Sodium: 139 mmol/L (ref 135–145)

## 2014-08-10 LAB — GLUCOSE, CAPILLARY
GLUCOSE-CAPILLARY: 125 mg/dL — AB (ref 70–99)
Glucose-Capillary: 118 mg/dL — ABNORMAL HIGH (ref 70–99)
Glucose-Capillary: 95 mg/dL (ref 70–99)
Glucose-Capillary: 109 mg/dL — ABNORMAL HIGH (ref 70–99)
Glucose-Capillary: 102 mg/dL — ABNORMAL HIGH (ref 70–99)

## 2014-08-10 LAB — PROCALCITONIN

## 2014-08-10 MED ORDER — PREDNISONE 20 MG PO TABS
40.0000 mg | ORAL_TABLET | Freq: Every day | ORAL | Status: DC
  Administered 2014-08-11 – 2014-08-12 (×2): 40 mg via ORAL
  Filled 2014-08-10 (×4): qty 2

## 2014-08-10 MED ORDER — MOMETASONE FURO-FORMOTEROL FUM 100-5 MCG/ACT IN AERO
2.0000 | INHALATION_SPRAY | Freq: Two times a day (BID) | RESPIRATORY_TRACT | Status: DC
  Administered 2014-08-11 – 2014-08-12 (×3): 2 via RESPIRATORY_TRACT
  Filled 2014-08-10 (×2): qty 8.8

## 2014-08-10 MED ORDER — LORATADINE 10 MG PO TABS
10.0000 mg | ORAL_TABLET | Freq: Every day | ORAL | Status: DC
  Administered 2014-08-10 – 2014-08-12 (×3): 10 mg via ORAL
  Filled 2014-08-10 (×4): qty 1

## 2014-08-10 MED ORDER — BUDESONIDE 0.25 MG/2ML IN SUSP
0.2500 mg | Freq: Four times a day (QID) | RESPIRATORY_TRACT | Status: DC
  Filled 2014-08-10 (×3): qty 2

## 2014-08-10 MED ORDER — METHYLPREDNISOLONE SODIUM SUCC 40 MG IJ SOLR
40.0000 mg | Freq: Two times a day (BID) | INTRAMUSCULAR | Status: DC
  Filled 2014-08-10: qty 1

## 2014-08-10 MED ORDER — BUDESONIDE 0.25 MG/2ML IN SUSP
0.2500 mg | Freq: Four times a day (QID) | RESPIRATORY_TRACT | Status: DC
  Administered 2014-08-11 (×2): 0.25 mg via RESPIRATORY_TRACT
  Filled 2014-08-10 (×8): qty 2

## 2014-08-10 NOTE — Progress Notes (Signed)
[facility] [place] Progress Note Patient Name: Courtney Ayers DOB: 1946-06-09 MRN: [id number]   Date of Service  08/10/2014  HPI/Events of Note  Called to get transfer order written to release pending orders.   eICU Interventions  Will order transfer to med-surg as planned by primary team today.      Intervention Category Minor Interventions: Routine modifications to care plan (e.g. PRN medications for pain, fever)  Herchel Hopkin [place] 08/10/2014, 5:26 PM

## 2014-08-10 NOTE — Progress Notes (Addendum)
PULMONARY / CRITICAL CARE MEDICINE   Name: Courtney Ayers MRN: [id number] DOB: 04-Nov-1945    ADMISSION DATE:  08/05/2014 CONSULTATION DATE:  08/10/2014  REFERRING MD :  Janice Norrie  CHIEF COMPLAINT:  SOB  INITIAL PRESENTATION:  69 y.o. F admitted 2/26 for SOB due to COPD exacerbation.  On evening of 2/28, rapid response called after pt had significant respiratory distress following using restroom.  She was transferred to ICU and PCCM consulted.     STUDIES:  2/26  CTA chest >> neg for PE, centrilobular emphysema in upper lobes, RLL pulm nodule measuring 69mm. 2/26  CXR >> COPD, small b/l effusions 2/29  ECHO >>Technically difficult study with poor acoustic windows. Normal LVsize with EF 60-65%. Mildly dilated RV with normal systolic function. No significant valvular abnormalities. Mild pulmonary hypertension. Dilated IVC suggestive of elevated RV fillingpressure.  SIGNIFICANT EVENTS: 2/26  Admit per [facility] 2/28  Transfer to ICU, started on BiPAP 3/01  To SDU, dyspnea improved.  Off BiPAP   SUBJECTIVE: Pt reports feeling better but not back to baseline.  Wore bipap overnight and reports she did not rest well.    VITAL SIGNS: Temp:  [97.2 F (36.2 C)-97.9 F (36.6 C)] 97.5 F (36.4 C) (03/02 0800) Pulse Rate:  [69-93] 69 (03/02 0800) Resp:  [18-26] 19 (03/02 0800) BP: (109-154)/(64-87) 116/64 mmHg (03/02 0800) SpO2:  [94 %-100 %] 96 % (03/02 0913) FiO2 (%):  [36 %-40 %] 36 % (03/02 0913)  VENTILATOR SETTINGS: Vent Mode:  [-]  FiO2 (%):  [36 %-40 %] 36 %   INTAKE / OUTPUT: Intake/Output      03/01 0701 - 03/02 0700 03/02 0701 - 03/03 0700   P.O. 570    I.V. (mL/kg) 230 (3.7) 30 (0.5)   Total Intake(mL/kg) 800 (13) 30 (0.5)   Urine (mL/kg/hr) 575 (0.4) 50 (0.1)   Emesis/NG output     Total Output 575 50   Net +225 -20        Urine Occurrence 175 x 1 x     PHYSICAL EXAMINATION: General: chronically ill in NAD Neuro: A&O x 3, non-focal.  HEENT: /AT. PERRL, sclerae  anicteric.  Cardiovascular: RRR, no M/R/G.  Lungs: even/non-labored, diminished but good air movement  Abdomen: BS x 4, soft, NT/ND.  Musculoskeletal: No gross deformities, no edema, left leg with cast Skin: Intact, warm, no rashes.  LABS:  CBC  Recent Labs Lab 08/07/14 0510 08/08/14 0250 08/10/14 0417  WBC 11.4* 11.6* 8.0  HGB 13.2 12.6 13.7  HCT 42.0 39.7 42.9  PLT 162 152 181   BMET  Recent Labs Lab 08/08/14 0250 08/09/14 1234 08/10/14 0417  NA 136 138 139  K 4.2 4.5 3.9  CL 91* 94* 93*  CO2 42* 37* 38*  BUN 14 13 16   CREATININE 0.66 0.58 0.65  GLUCOSE 131* 96 128*   Imaging Dg Chest Port 1 View  08/10/2014   CLINICAL DATA:  Chronic obstructive pulmonary disease, followup  EXAM: PORTABLE CHEST - 1 VIEW  COMPARISON:  Portable chest x-ray of 08/08/2014 and CT chest of 08/05/2014  FINDINGS: As noted on recent CT, there is severe changes of centrilobular emphysema noted. The nodule described posterior medially in the right lower lobe on CT scan is not visualized by chest x-ray. No new infiltrate or plural effusion is seen. Mediastinal and hilar contours are unchanged. Heart size is stable.  IMPRESSION: 1. Severe emphysema.  No definite infiltrate or effusion. 2. The nodule noted by CT in the  right lower lobe posterior medially is not seen by chest x-ray.   Electronically Signed   By: Ivar Drape M.D.   On: 08/10/2014 07:13     ASSESSMENT / PLAN:  Acute on Chronic Hypoxic and Hypercarbic Respiratory Failure AECOPD - has never had PFT's, emphysema as shown on CTA 2/26  Dyspnea - troponin neg, ECHO with grade 1 DD, RV mild dilation, PA peak 38 RLL pulmonary nodule measuring 44mm Tobacco use disorder P:   Discontinue further bipap  Change IV steroids to 40 Q12, transition to PO pred 3/3 DuoNebs Q6 + PRN Albuterol PCT neg, low clinical suspicion, abx discontinued 3/1.  Monitor fever curve / WBC.  Will need outpt nodule follow up - PET Trend CXR Neg balance did well,  consider repeat  Begin Dulera 3/2 pm (100/5 as has not been on a maintenance regimen before admit), d/c scheduled duonebs in am 3/3 Will need ambulatory desaturation assessment prior to discharge Goal SpO2 88-94% Follow up arranged with Dr. [place] Bells post discharge.   PFT's as an outpatient   Sputum 2/28 >>> Abx: Levaquin, start date 2/26, day 4/4.  D/C'd 3/1   Steroid induced hyperglycemia  NML TSH  P:   SSI   Acute metabolic encephalopathy - resolved.  Depression / Anxiety P:   Continue celexa   Transfer to medical floor 3/2.  Anticipate d/c on 3/4.     Noe Gens, NP-C [place] [facility] Pgr: [phone] or [phone]    08/10/2014 2:07 PM  PCCM ATTENDING: I have reviewed pt's initial presentation, consultants notes and hospital database in detail.  The above assessment and plan was formulated under my direction.  She is nearing discharge. Transfer to med-surg floor. DC telemetry. Add nebulized steroids. Cont nebulized BDs. Eventually should be discharged on combined steroid/LABA and PRN albuterol. We have discussed need for smoking cessation. Will need to assess home O2 reqt's  Merton Border, MD;  PCCM service; Mobile [phone]

## 2014-08-11 LAB — GLUCOSE, CAPILLARY
GLUCOSE-CAPILLARY: 108 mg/dL — AB (ref 70–99)
Glucose-Capillary: 153 mg/dL — ABNORMAL HIGH (ref 70–99)
Glucose-Capillary: 129 mg/dL — ABNORMAL HIGH (ref 70–99)
Glucose-Capillary: 104 mg/dL — ABNORMAL HIGH (ref 70–99)

## 2014-08-11 MED ORDER — SENNA 8.6 MG PO TABS
1.0000 | ORAL_TABLET | Freq: Every day | ORAL | Status: DC | PRN
  Administered 2014-08-12: 8.6 mg via ORAL
  Filled 2014-08-11 (×2): qty 1

## 2014-08-11 MED ORDER — DOCUSATE SODIUM 100 MG PO CAPS
100.0000 mg | ORAL_CAPSULE | Freq: Two times a day (BID) | ORAL | Status: DC | PRN
  Administered 2014-08-12: 100 mg via ORAL

## 2014-08-11 NOTE — Progress Notes (Signed)
PULMONARY / CRITICAL CARE MEDICINE   Name: Courtney Ayers MRN: [id number] DOB: 10-14-1945    ADMISSION DATE:  08/05/2014 CONSULTATION DATE:  08/11/2014  REFERRING MD :  Janice Norrie  CHIEF COMPLAINT:  SOB  INITIAL PRESENTATION:  69 y.o. F admitted 2/26 for SOB due to COPD exacerbation.  On evening of 2/28, rapid response called after pt had significant respiratory distress following using restroom.  She was transferred to ICU and PCCM consulted.     STUDIES:  2/26  CTA chest >> neg for PE, centrilobular emphysema in upper lobes, RLL pulm nodule measuring 46mm. 2/26  CXR >> COPD, small b/l effusions 2/29  ECHO >>Technically difficult study with poor acoustic windows. Normal LVsize with EF 60-65%. Mildly dilated RV with normal systolic function. No significant valvular abnormalities. Mild pulmonary hypertension. Dilated IVC suggestive of elevated RV fillingpressure.  SIGNIFICANT EVENTS: 2/26  Admit per [facility] 2/28  Transfer to ICU, started on BiPAP 3/01  To SDU, dyspnea improved.  Off BiPAP 3/2 To med-surge   SUBJECTIVE: Dyspnea much improved, still on O2 though.  Denies chest pain, productive cough, fevers/chills/sweats.   VITAL SIGNS: Temp:  [98.4 F (36.9 C)-99.1 F (37.3 C)] 99.1 F (37.3 C) (03/03 0521) Pulse Rate:  [81-96] 96 (03/03 0521) Resp:  [16-21] 18 (03/03 0521) BP: (109-146)/(58-79) 109/58 mmHg (03/03 0521) SpO2:  [91 %-96 %] 93 % (03/03 0911) FiO2 (%):  [26 %] 26 % (03/02 1519) Weight:  [63.322 kg (139 lb 9.6 oz)] 63.322 kg (139 lb 9.6 oz) (03/02 2010)  VENTILATOR SETTINGS: Vent Mode:  [-]  FiO2 (%):  [26 %] 26 %   INTAKE / OUTPUT: Intake/Output      03/02 0701 - 03/03 0700 03/03 0701 - 03/04 0700   P.O. 480 240   I.V. (mL/kg) 230 (3.6)    Total Intake(mL/kg) 710 (11.2) 240 (3.8)   Urine (mL/kg/hr) 650 (0.4) 400 (1.6)   Total Output 650 400   Net +60 -160        Urine Occurrence 1 x      PHYSICAL EXAMINATION: General: Chronically ill appearing  female, resting in bed visiting with family member. In NAD. Neuro: A&O x 3, non-focal.  HEENT: [place]/AT. PERRL, sclerae anicteric.  Cardiovascular: RRR, no M/R/G.  Lungs: even/non-labored, diminished in bases.  Abdomen: BS x 4, soft, NT/ND.  Musculoskeletal: No gross deformities, no edema, left leg with cast Skin: Intact, warm, no rashes.  LABS:  CBC  Recent Labs Lab 08/07/14 0510 08/08/14 0250 08/10/14 0417  WBC 11.4* 11.6* 8.0  HGB 13.2 12.6 13.7  HCT 42.0 39.7 42.9  PLT 162 152 181   BMET  Recent Labs Lab 08/08/14 0250 08/09/14 1234 08/10/14 0417  NA 136 138 139  K 4.2 4.5 3.9  CL 91* 94* 93*  CO2 42* 37* 38*  BUN 14 13 16   CREATININE 0.66 0.58 0.65  GLUCOSE 131* 96 128*   Imaging Dg Chest Port 1 View  08/10/2014   CLINICAL DATA:  Chronic obstructive pulmonary disease, followup  EXAM: PORTABLE CHEST - 1 VIEW  COMPARISON:  Portable chest x-ray of 08/08/2014 and CT chest of 08/05/2014  FINDINGS: As noted on recent CT, there is severe changes of centrilobular emphysema noted. The nodule described posterior medially in the right lower lobe on CT scan is not visualized by chest x-ray. No new infiltrate or plural effusion is seen. Mediastinal and hilar contours are unchanged. Heart size is stable.  IMPRESSION: 1. Severe emphysema.  No definite infiltrate or  effusion. 2. The nodule noted by CT in the right lower lobe posterior medially is not seen by chest x-ray.   Electronically Signed   By: Ivar Drape M.D.   On: 08/10/2014 07:13     ASSESSMENT / PLAN:  Acute on Chronic Hypoxic and Hypercarbic Respiratory Failure AECOPD - has never had PFT's, emphysema as shown on CTA 2/26  Dyspnea - troponin neg, ECHO with grade 1 DD, RV mild dilation, PA peak 38 RLL pulmonary nodule measuring 35mm Tobacco use disorder P:   Continue supplemental O2 as needed for goal SpO2 88-94%. BiPAP d/c'd 3/2. Continues to not need. Continue PO Pred (started 40mg  daily on 3/3).  Plan for  taper. Continue DuoNebs Q6 + PRN Albuterol. Budesonide d/c'd. Continue Dulera (has not been on a maintenance regimen before admit). Will need ambulatory desaturation assessment prior to discharge. Follow up arranged with Dr. Lake Bells post discharge (scheduled for 08/26/14). PFT's as an outpatient. Will need outpt nodule follow up - PET. Tobacco cessation education provided and strongly encouraged.  Steroid induced hyperglycemia - resolving NML TSH  P:   Monitor glucose, SSI if consistently > 180.  Acute metabolic encephalopathy - resolved Depression / Anxiety P:   Continue celexa   Montey Hora, PA - [facility] Pager: [phone]  or [phone] 08/11/2014, 11:13 AM   Attending:  I have seen and examined the patient with nurse practitioner/resident and agree with the note above.   Courtney Ayers is feeling much better This is her first exacerbation of COPD CT reviewed> significant emphysema, nodule RLL 1cm  Lungs: clear on my exam, diminished breath sounds  Plan: Home O2 evaluation Nebs to prn Consider d/c home today if she is feeling up to it after walk  [name], MD [facility] Pager: [phone] Cell: [phone] If no response, call [phone]

## 2014-08-11 NOTE — Progress Notes (Signed)
SATURATION QUALIFICATIONS: (This note is used to comply with regulatory documentation for home oxygen)  Patient Saturations on Room Air at Rest = 88%  Patient Saturations on Room Air while Ambulating = 87%  Patient Saturations on 2 Liters of oxygen while Ambulating = 93%  Please briefly explain why patient needs home oxygen: COPD

## 2014-08-12 ENCOUNTER — Telehealth: Payer: Self-pay

## 2014-08-12 MED ORDER — MOMETASONE FURO-FORMOTEROL FUM 100-5 MCG/ACT IN AERO: 2.0000 | INHALATION_SPRAY | Freq: Two times a day (BID) | RESPIRATORY_TRACT | Status: DC

## 2014-08-12 MED ORDER — FLUTICASONE PROPIONATE 50 MCG/ACT NA SUSP: 2.0000 | Freq: Every day | NASAL | Status: AC

## 2014-08-12 MED ORDER — LORATADINE 10 MG PO TABS: 10.0000 mg | ORAL_TABLET | Freq: Every day | ORAL | Status: DC

## 2014-08-12 MED ORDER — PREDNISONE 20 MG PO TABS: ORAL_TABLET | ORAL | Status: DC

## 2014-08-12 NOTE — Discharge Summary (Signed)
Physician Discharge Summary  Patient ID: Courtney Ayers MRN: [id number] DOB/AGE: 70-04-1946 70 y.o.  Admit date: 08/05/2014 Discharge date: 08/12/2014    Discharge Diagnoses:  Acute on Chronic Hypoxic and Hypercarbic Respiratory Failure AECOPD - has never had PFT's, emphysema as shown on CTA 2/26  Dyspnea - troponin neg, ECHO with grade 1 DD, RV mild dilation, PA peak 38 RLL pulmonary nodule measuring 39m Tobacco Abuse Allergic Rhinitis Steroid Induced Hyperglycemia  NML TSH Acute Metabolic Encephalopathy - resolved Depression / Anxiety                                                                        DISCHARGE PLAN BY DIAGNOSIS     Acute on Chronic Hypoxic and Hypercarbic Respiratory Failure AECOPD - has never had PFT's, emphysema as shown on CTA 2/26  Dyspnea - troponin neg, ECHO with grade 1 DD, RV mild dilation, PA peak 38 Pulmonary Nodule - RLL measuring 182m  Note hx of melanoma.   Tobacco Abuse Allergic Rhinitis  Discharge Plan: Oxygen arranged for 2L continuously.  Patient counseled on smoking near compressed gas. Prednisone taper to off Continue PRN [place] Continue DuRuthe Mannanwas not been on a maintenance regimen before admit). Will need ambulatory reassessment for O2 needs at follow up.   PFT's as an outpatient. Will need outpt nodule follow up - PET. Tobacco cessation education provided and strongly encouraged. Continue flonase + Claritin   Steroid Induced Hyperglycemia  NML TSH   Discharge Plan:  Anticipate glucose will return to baseline once off prednisone  Acute Metabolic Encephalopathy - resolved Depression / Anxiety  Discharge Plan: Continue celexa                     DISCHARGE SUMMARY   Courtney Ayers a [age].o. y/o female, smoker, with a PMH of osteopenia, OA, colon polyp, depression / anxiety, melanoma excision, and allergic rhinitis [facility] with a two day history of increasing shortness of breath, DOE, fatigue, and pain between her shoulder blades.  She attempted home use of Asthalin and humidity without improvement in symptoms.    Prior to this admission, she has never been admitted for respiratory issues.  She notes that she has had a progressive decline over the past year in regards to activity tolerance.  She notes worsening shortness of breath with activity and has been changing her routine to be at home more.  She had never seen a pulmonologist or been diagnosed with COPD PTA.   ER work up noted her to have significant shortness of breath, grunting respirations, audible wheezing with quiet breath sounds.  Initial CXR was unremarkable.  Labs notable for elevated D-Dimer.  Follow up CTA Chest was negative embolism however, she did have a 10 mm RLL pulmonary nodule.  [facility]  The patient was treated with empiric antibiotics, IV steroids, nebulized bronchodilators and oxygen.  Overnight, 2/28 she developed significant respiratory distress while attempting to go to the bathroom.  PCCM was consulted and patient was transitioned to ICU and placed on BiPAP with improvement in symptoms.  She made slow progress and was transitioned out of ICU / off BiPAP.  Steroids were tapered and antibiotics discontinued.  She was assessed for ambulatory desaturation prior to discharge and was arranged for 2L home O2.  The patient was medically cleared for discharge 3/4 with plans as above.            SIGNIFICANT DIAGNOSTIC STUDIES 2/26 CTA chest >> neg for PE, centrilobular emphysema in upper lobes, RLL pulm nodule measuring 45m. 2/26 CXR >> COPD, small b/l effusions 2/29 ECHO >>Technically difficult study with poor acoustic windows. Normal LVsize with EF 60-65%. Mildly dilated RV with normal systolic function. No significant valvular abnormalities. Mild pulmonary hypertension. Dilated IVC  suggestive of elevated RV fillingpressure.   SIGNIFICANT EVENTS: [facility], started on BiPAP 3/01 To SDU, dyspnea improved. Off BiPAP 3/2 To med-surge   ANTIBIOTICS Azithromycin 2/26 x1 Levaquin 2/26 >> 3/1    Discharge Exam: General: chronically ill adult female in NAD Neuro: AAOx4, MAE CV: s1s2 rrr, no m/r/g PULM: even/non-labored, lungs diminished but no active wheeze GI: soft/NT/ND Extremities: warm/dry, thin skin  Filed Vitals:   08/11/14 2019 08/11/14 2225 08/12/14 0214 08/12/14 0548  BP:  95/47  104/47  Pulse:  88    Temp:  99.3 F (37.4 C)  98 F (36.7 C)  TempSrc:  Oral  Oral  Resp:  14  18  Height:      Weight:      SpO2: 96% 99% 97% 97%     Discharge Labs  BMET  Recent Labs Lab 08/06/14 1115 08/08/14 0250 08/09/14 1234 08/10/14 0417  NA 140 136 138 139  K 4.4 4.2 4.5 3.9  CL 100 91* 94* 93*  CO2 34* 42* 37* 38*  GLUCOSE 137* 131* 96 128*  BUN _0 CREATININE 0.71 0.66 0.58 0.65  CALCIUM 8.9 9.0 8.9 8.8  MG  --  2.4  --   --   PHOS  --  3.9  --   --    CBC  Recent Labs Lab 08/07/14 0510 08/08/14 0250 08/10/14 0417  HGB 13.2 12.6 13.7  HCT 42.0 39.7 42.9  WBC 11.4* 11.6* 8.0  PLT 162 152 181    Discharge Instructions    Call MD for:  difficulty breathing, headache or visual disturbances    Complete by:  As directed      Call MD for:  extreme fatigue    Complete by:  As directed      Call MD for:  hives    Complete by:  As directed      Call MD for:  persistant dizziness or light-headedness    Complete by:  As directed      Call MD for:  persistant nausea and vomiting    Complete by:  As directed      Call MD for:  severe uncontrolled pain    Complete by:  As directed      Call MD for:  temperature >100.4    Complete by:  As directed      Diet general    Complete by:  As directed      Discharge instructions    Complete by:  As directed   1.  Stop smoking!   2.  Review your  medications carefully as they have changed.  3.  Follow up with Dr. MLake Bellsas scheduled 4.  Resume exercise as tolerated 5.  Wear your oxygen continuously @  2 liters per nasal cannula.  6.  VERY IMPORTANT THAT YOU DO NOT SMOKE OR ALLOW OTHERS TO SMOKE NEAR YOUR OXYGEN.     Increase activity slowly    Complete by:  As directed                 Follow-up Information    Follow up with Simonne Maffucci, MD. Call on 08/26/2014.   Specialty:  Pulmonary Disease   Why:  Appt at 10:00 AM    Contact information:   [place] [address] [phone]       Follow up with KNAPP,EVE A, MD.   Specialty:  Family Medicine   Why:  As needed   Contact information:   [place] [address] [phone]          Medication List    TAKE these medications        b complex vitamins tablet  Take 1 tablet by mouth daily.     citalopram 10 MG tablet  Commonly known as:  CELEXA  take 1 tablet by mouth once daily     fluticasone 50 MCG/ACT nasal spray  Commonly known as:  FLONASE  Place 2 sprays into both nostrils daily.     guaiFENesin 600 MG 12 hr tablet  Commonly known as:  MUCINEX  Take 600 mg by mouth at bedtime.     ibuprofen 200 MG tablet  Commonly known as:  ADVIL,MOTRIN  Take 400 mg by mouth every 6 (six) hours as needed for mild pain.     loratadine 10 MG tablet  Commonly known as:  CLARITIN  Take 1 tablet (10 mg total) by mouth daily.     mometasone-formoterol 100-5 MCG/ACT Aero  Commonly known as:  DULERA  Inhale 2 puffs into the lungs 2 (two) times daily.     predniSONE 20 MG tablet  Commonly known as:  DELTASONE  2 tabs daily for 2 days, then 1 tab daily for 2 days, then 1/2 tab daily for 2 days, then stop     PRESCRIPTION MEDICATION  Inhale 1 spray into the lungs as needed (wheezing, shortness of breath). ASTHALIN 100MCG/DOSE INHALER  AS NEEDED PT GETS FROM [place]     VIACTIV [phone] MG-UNT-MCG Chew  Generic drug:   Calcium-Vitamin D-Vitamin K  Chew 2 each by mouth daily.     vitamin B-12 100 MCG tablet  Commonly known as:  CYANOCOBALAMIN  Take 100 mcg by mouth daily.     vitamin C 500 MG tablet  Commonly known as:  ASCORBIC ACID  Take 1,000 mg by mouth daily.     vitamin E 400 UNIT capsule  Take 400 Units by mouth daily.          Disposition:  Home.  Home oxygen arranged for 2L / [place].    Discharged Condition: Courtney Ayers has met maximum benefit of inpatient care and is medically stable and cleared for discharge.  Patient is pending follow up as above.      Time spent on disposition:  Greater than 35 minutes.   Signed: Noe Gens, NP-C [facility] Pgr: [phone] Office: [phone]    Attending Agree with above, seen and examined  [name], MD [facility] Pager: [phone] Cell: [phone] If no response, call [phone]

## 2014-08-12 NOTE — Progress Notes (Signed)
NURSING PROGRESS NOTE  Courtney Ayers [id number] Discharge Data: 08/12/2014 4:05 PM Attending Provider: No att. providers found FGB:MSXJD,BZM A, MD     Lenon Oms to be D/C'd Home per MD order.  Discussed with the patient the After Visit Summary and all questions fully answered. All IV's discontinued with no bleeding noted. All belongings returned to patient for patient to take home.   Last Vital Signs:  Blood pressure 106/42, pulse 90, temperature 98.3 F (36.8 C), temperature source Oral, resp. rate 18, height 5\' 8"  (1.727 m), weight 63.322 kg (139 lb 9.6 oz), last menstrual period 06/10/1992, SpO2 98 %.  Discharge Medication List   Medication List    TAKE these medications        b complex vitamins tablet  Take 1 tablet by mouth daily.     citalopram 10 MG tablet  Commonly known as:  CELEXA  take 1 tablet by mouth once daily     fluticasone 50 MCG/ACT nasal spray  Commonly known as:  FLONASE  Place 2 sprays into both nostrils daily.     guaiFENesin 600 MG 12 hr tablet  Commonly known as:  MUCINEX  Take 600 mg by mouth at bedtime.     ibuprofen 200 MG tablet  Commonly known as:  ADVIL,MOTRIN  Take 400 mg by mouth every 6 (six) hours as needed for mild pain.     loratadine 10 MG tablet  Commonly known as:  CLARITIN  Take 1 tablet (10 mg total) by mouth daily.     mometasone-formoterol 100-5 MCG/ACT Aero  Commonly known as:  DULERA  Inhale 2 puffs into the lungs 2 (two) times daily.     predniSONE 20 MG tablet  Commonly known as:  DELTASONE  2 tabs daily for 2 days, then 1 tab daily for 2 days, then 1/2 tab daily for 2 days, then stop     PRESCRIPTION MEDICATION  Inhale 1 spray into the lungs as needed (wheezing, shortness of breath). ASTHALIN 100MCG/DOSE INHALER  AS NEEDED PT GETS FROM [place]     VIACTIV [phone] MG-UNT-MCG Chew  Generic drug:  Calcium-Vitamin D-Vitamin K  Chew 2 each by mouth daily.     vitamin B-12 100 MCG tablet  Commonly known  as:  CYANOCOBALAMIN  Take 100 mcg by mouth daily.     vitamin C 500 MG tablet  Commonly known as:  ASCORBIC ACID  Take 1,000 mg by mouth daily.     vitamin E 400 UNIT capsule  Take 400 Units by mouth daily.         Wallie Renshaw, RN

## 2014-08-12 NOTE — Telephone Encounter (Signed)
Called and talked with sister she was checking with patient to see when she could come in will call back monday

## 2014-08-15 ENCOUNTER — Telehealth: Payer: Self-pay | Admitting: Pulmonary Disease

## 2014-08-15 MED ORDER — MOMETASONE FURO-FORMOTEROL FUM 100-5 MCG/ACT IN AERO: 2.0000 | INHALATION_SPRAY | Freq: Two times a day (BID) | RESPIRATORY_TRACT | Status: DC

## 2014-08-15 NOTE — Telephone Encounter (Signed)
Rx was sent to wrong pharmacy. This has been changed. Pt is aware. Nothing further was needed.

## 2014-08-15 NOTE — Telephone Encounter (Signed)
pts insurance will not cover the dulera but they will cover the advair.  BQ please advise of  Any changes that we can make.  Thanks  No Known Allergies  Current Outpatient Prescriptions on File Prior to Visit  Medication Sig Dispense Refill  . b complex vitamins tablet Take 1 tablet by mouth daily.      . Calcium-Vitamin D-Vitamin K (VIACTIV) [phone] MG-UNT-MCG CHEW Chew 2 each by mouth daily.    . citalopram (CELEXA) 10 MG tablet take 1 tablet by mouth once daily 90 tablet 3  . fluticasone (FLONASE) 50 MCG/ACT nasal spray Place 2 sprays into both nostrils daily. 16 g 5  . guaiFENesin (MUCINEX) 600 MG 12 hr tablet Take 600 mg by mouth at bedtime.    [name] ibuprofen (ADVIL,MOTRIN) 200 MG tablet Take 400 mg by mouth every 6 (six) hours as needed for mild pain.     [name] loratadine (CLARITIN) 10 MG tablet Take 1 tablet (10 mg total) by mouth daily. 30 tablet   . mometasone-formoterol (DULERA) 100-5 MCG/ACT AERO Inhale 2 puffs into the lungs 2 (two) times daily. 1 Inhaler 3  . predniSONE (DELTASONE) 20 MG tablet 2 tabs daily for 2 days, then 1 tab daily for 2 days, then 1/2 tab daily for 2 days, then stop 7 tablet 0  . PRESCRIPTION MEDICATION Inhale 1 spray into the lungs as needed (wheezing, shortness of breath). ASTHALIN 100MCG/DOSE INHALER  AS NEEDED PT GETS FROM [place]    . vitamin B-12 (CYANOCOBALAMIN) 100 MCG tablet Take 100 mcg by mouth daily.     . vitamin C (ASCORBIC ACID) 500 MG tablet Take 1,000 mg by mouth daily.      . vitamin E 400 UNIT capsule Take 400 Units by mouth daily.     No current facility-administered medications on file prior to visit.

## 2014-08-16 MED ORDER — FLUTICASONE-SALMETEROL 250-50 MCG/DOSE IN AEPB: 1.0000 | INHALATION_SPRAY | Freq: Two times a day (BID) | RESPIRATORY_TRACT | Status: DC

## 2014-08-16 NOTE — Telephone Encounter (Signed)
Advair 250/50 bid

## 2014-08-16 NOTE — Telephone Encounter (Signed)
Pt is aware of medication change. Samples of Advair will be left at the front desk for pick up. Nothing further was needed.

## 2014-08-25 ENCOUNTER — Ambulatory Visit (INDEPENDENT_AMBULATORY_CARE_PROVIDER_SITE_OTHER): Payer: Medicare Other | Admitting: Family Medicine

## 2014-08-25 ENCOUNTER — Encounter: Payer: Self-pay | Admitting: Family Medicine

## 2014-08-25 VITALS — BP 120/70 | HR 72 | Wt 138.0 lb

## 2014-08-25 DIAGNOSIS — F17200 Nicotine dependence, unspecified, uncomplicated: Secondary | ICD-10-CM

## 2014-08-25 DIAGNOSIS — Z720 Tobacco use: Secondary | ICD-10-CM | POA: Diagnosis not present

## 2014-08-25 DIAGNOSIS — J9601 Acute respiratory failure with hypoxia: Secondary | ICD-10-CM

## 2014-08-25 DIAGNOSIS — J301 Allergic rhinitis due to pollen: Secondary | ICD-10-CM | POA: Diagnosis not present

## 2014-08-25 NOTE — Progress Notes (Signed)
   Subjective:    Patient ID: Courtney Ayers, female    DOB: [birth year], 69 y.o.   MRN: [id number]  HPI She is here for hospital follow-up.She was discharged on March 4. She has been home since then and slowly increasing her physical activities. She is on constant O2 at roughly 2 L a minute. Presently she is taking Advair. She also has underlying allergies and is on Flonase as well as Claritin. She did finish taking her prednisone. In general she seems to be making good progress. No longer smokes.   Review of Systems     Objective:   Physical Exam Alert and in no distress. Tympanic membranes and canals are normal. Pharyngeal area is normal. Neck is supple without adenopathy or thyromegaly. Cardiac exam shows a regular sinus rhythm without murmurs or gallops. Lungs are clear to auscultation. Hospital record was reviewed.       Assessment & Plan:  Allergic rhinitis due to pollen  Acute respiratory failure with hypoxia  Tobacco use disorder She will continue on her present regimen of medications including Celexa, lacking, Flonase and Advair. She is to follow-up with pulmonary in the near future. Over 20 minutes spent face-to-face discussing her overall care.

## 2014-08-26 ENCOUNTER — Ambulatory Visit (INDEPENDENT_AMBULATORY_CARE_PROVIDER_SITE_OTHER): Payer: Medicare Other | Admitting: Pulmonary Disease

## 2014-08-26 ENCOUNTER — Encounter: Payer: Self-pay | Admitting: Pulmonary Disease

## 2014-08-26 VITALS — BP 116/64 | HR 74 | Ht 68.0 in | Wt 138.0 lb

## 2014-08-26 DIAGNOSIS — R918 Other nonspecific abnormal finding of lung field: Secondary | ICD-10-CM | POA: Insufficient documentation

## 2014-08-26 DIAGNOSIS — J441 Chronic obstructive pulmonary disease with (acute) exacerbation: Secondary | ICD-10-CM | POA: Diagnosis not present

## 2014-08-26 DIAGNOSIS — J209 Acute bronchitis, unspecified: Secondary | ICD-10-CM

## 2014-08-26 DIAGNOSIS — R911 Solitary pulmonary nodule: Secondary | ICD-10-CM

## 2014-08-26 DIAGNOSIS — J9601 Acute respiratory failure with hypoxia: Secondary | ICD-10-CM

## 2014-08-26 DIAGNOSIS — J440 Chronic obstructive pulmonary disease with acute lower respiratory infection: Principal | ICD-10-CM

## 2014-08-26 MED ORDER — FLUTICASONE-SALMETEROL 250-50 MCG/DOSE IN AEPB: 1.0000 | INHALATION_SPRAY | Freq: Two times a day (BID) | RESPIRATORY_TRACT | Status: DC

## 2014-08-26 NOTE — Assessment & Plan Note (Signed)
She has a 1 cm smooth bordered, solid nodule in her right lower lobe. I'm hopeful that this will be benign based on its radiographic features but considering her smoking history it needs a further evaluation. I agree with the radiologist that a PET scan would be a good next step.  Plan: -PET CT scan

## 2014-08-26 NOTE — Patient Instructions (Signed)
Take Advair twice a day no matter how you feel Use albuterol on an as-needed basis for shortness of breath, every 4 hours as needed We will arrange a pet scan for the pulmonary nodule and call you with the results We will obtain lung function testing prior to the next visit in 6 weeks Keep using her oxygen as you have been until the next visit

## 2014-08-26 NOTE — Progress Notes (Signed)
Subjective:    Patient ID: Courtney Ayers, female    DOB: December 15, 1945, 69 y.o.   MRN: [id number]  Synopsis: Nazli was hospitalized in February 2016 for a COPD exacerbation. She had a CT angiogram of her chest which showed significant emphysema throughout both lungs as well as a 1 cm pulmonary nodule in the right lower lobe.  HPI Chief Complaint  Patient presents with  . Hospitalization Follow-up    Pt recently hospitalized at [facility] for COPD, dyspnea, chest pain.  Pt currently c/o sob with exertion.      Courtney Ayers has been doing well since the last hospitalization. She says that she has not been using her do where on a regular basis because she did not know that she was supposed to be using it twice a day. She says in general her breathing has improved and she's been capable of running a vacuum cleaner and cooking for herself at home without difficulty. She does not have significant cough. She has successfully quit smoking. She said she had an episode a few days ago when she had a chase her dogs without oxygen and she became quite short of breath. She has noted a slight amount of ankle swelling bilaterally. She denies pain of any kind.  Past Medical History  Diagnosis Date  . Allergic rhinitis, cause unspecified     seasonal  . Osteopenia   . OA (osteoarthritis)     B THUMB, LEFT HIP  . Adenomatous colon polyp   . Depressive disorder, not elsewhere classified 2006    some anxiety recurs when she has stopped med in past      Review of Systems  Constitutional: Negative for fever, chills and fatigue.  HENT: Positive for postnasal drip and rhinorrhea. Negative for sinus pressure.   Respiratory: Positive for shortness of breath and wheezing. Negative for cough.   Cardiovascular: Negative for chest pain, palpitations and leg swelling.       Objective:   Physical Exam  Filed Vitals:   08/26/14 1012  BP: 116/64  Pulse: 74  Height: 5\' 8"  (1.727 m)  Weight: 138 lb (62.596 kg)    SpO2: 95%   Ambulated 500 feet on RA and O2 saturation remained above 95%  Gen: well appearing, no acute distress HEENT: NCAT, PERRL, EOMi, OP clear, neck supple without masses PULM: Slight wheeze RUL, good air movement, otherwise clear CV: RRR, no mgr, no JVD AB: BS+, soft, nontender, Ext: warm, trace ankle edema, no clubbin Derm: no rash or skin breakdown Neuro: A&Ox4,MAEW  February 2016 CT angiogram of the chest images reviewed> there is moderate to severe emphysema bilaterally throughout both lungs, there is a 1 cm, smooth bordered, solid nodule in the right lower lobe      Assessment & Plan:   COPD (chronic obstructive pulmonary disease) with acute bronchitis Courtney Ayers has done well since her hospital stay and is slowly improving. We need to get lung function testing but that can wait for about 1-2 months after her most recent exacerbation. She is currently not using her controller medications as directed and so we went over that today. In general, I am pleased with her progress since her hospital stay.  Plan: -Advair twice a day (this is what her insurance company covers)  -stop [employer] -Encouraged regular exercise   Acute respiratory failure with hypoxia Continue using 2 L continuously. We will add a humidifier because of some nasal dryness.   Solitary pulmonary nodule She has a 1 cm smooth  bordered, solid nodule in her right lower lobe. I'm hopeful that this will be benign based on its radiographic features but considering her smoking history it needs a further evaluation. I agree with the radiologist that a PET scan would be a good next step.  Plan: -PET CT scan    Updated Medication List Outpatient Encounter Prescriptions as of 08/26/2014  Medication Sig  . b complex vitamins tablet Take 1 tablet by mouth daily.    . Calcium-Vitamin D-Vitamin K (VIACTIV) 601-093-23 MG-UNT-MCG CHEW Chew 2 each by mouth daily.  . citalopram (CELEXA) 10 MG tablet take 1 tablet by  mouth once daily  . fluticasone (FLONASE) 50 MCG/ACT nasal spray Place 2 sprays into both nostrils daily.  . Fluticasone-Salmeterol (ADVAIR DISKUS) 250-50 MCG/DOSE AEPB Inhale 1 puff into the lungs 2 (two) times daily.  [name] guaiFENesin (MUCINEX) 600 MG 12 hr tablet Take 600 mg by mouth at bedtime.  [name] ibuprofen (ADVIL,MOTRIN) 200 MG tablet Take 400 mg by mouth every 6 (six) hours as needed for mild pain.   [name] loratadine (CLARITIN) 10 MG tablet Take 1 tablet (10 mg total) by mouth daily.  [name] PRESCRIPTION MEDICATION Asthalin inhaler- use 2 puffs prn sob  . vitamin B-12 (CYANOCOBALAMIN) 100 MCG tablet Take 100 mcg by mouth daily.   . vitamin C (ASCORBIC ACID) 500 MG tablet Take 1,000 mg by mouth daily.    . vitamin E 400 UNIT capsule Take 400 Units by mouth daily.  . [DISCONTINUED] Fluticasone-Salmeterol (ADVAIR DISKUS) 250-50 MCG/DOSE AEPB Inhale 1 puff into the lungs 2 (two) times daily.  . [DISCONTINUED] mometasone-formoterol (DULERA) 100-5 MCG/ACT AERO Inhale 2 puffs into the lungs 2 (two) times daily. (Patient not taking: Reported on 08/25/2014)  . [DISCONTINUED] predniSONE (DELTASONE) 20 MG tablet 2 tabs daily for 2 days, then 1 tab daily for 2 days, then 1/2 tab daily for 2 days, then stop (Patient not taking: Reported on 08/25/2014)  . [DISCONTINUED] PRESCRIPTION MEDICATION Inhale 1 spray into the lungs as needed (wheezing, shortness of breath). ASTELIN 100MCG/DOSE INHALER  AS NEEDED PT GETS FROM [place]

## 2014-08-26 NOTE — Assessment & Plan Note (Signed)
Courtney Ayers has done well since her hospital stay and is slowly improving. We need to get lung function testing but that can wait for about 1-2 months after her most recent exacerbation. She is currently not using her controller medications as directed and so we went over that today. In general, I am pleased with her progress since her hospital stay.  Plan: -Advair twice a day (this is what her insurance company covers)  -stop Dentist -Encouraged regular exercise

## 2014-08-26 NOTE — Assessment & Plan Note (Signed)
Continue using 2 L continuously. We will add a humidifier because of some nasal dryness.

## 2014-08-29 DIAGNOSIS — H16223 Keratoconjunctivitis sicca, not specified as Sjogren's, bilateral: Secondary | ICD-10-CM | POA: Diagnosis not present

## 2014-09-02 ENCOUNTER — Ambulatory Visit (HOSPITAL_COMMUNITY)
Admission: RE | Admit: 2014-09-02 | Discharge: 2014-09-02 | Disposition: A | Discharge status: Home / Self Care | Payer: Medicare Other | Source: Ambulatory Visit | Attending: Pulmonary Disease | Admitting: Pulmonary Disease

## 2014-09-02 DIAGNOSIS — R911 Solitary pulmonary nodule: Secondary | ICD-10-CM

## 2014-09-02 DIAGNOSIS — Z79899 Other long term (current) drug therapy: Secondary | ICD-10-CM | POA: Diagnosis not present

## 2014-09-02 LAB — GLUCOSE, CAPILLARY: Glucose-Capillary: 88 mg/dL (ref 70–99)

## 2014-09-02 MED ORDER — FLUDEOXYGLUCOSE F - 18 (FDG) INJECTION
7.4000 | Freq: Once | INTRAVENOUS | Status: AC | PRN
  Administered 2014-09-02: 7.4 via INTRAVENOUS

## 2014-09-08 ENCOUNTER — Telehealth: Payer: Self-pay | Admitting: Pulmonary Disease

## 2014-09-08 NOTE — Telephone Encounter (Signed)
PET scan result- The RLL nodule is not active on PET and the radiologist favors this being benign. A follow-up  Non-contrast CT in a few months is recommended and can be discussed with Dr Lake Bells when he returns.

## 2014-09-08 NOTE — Telephone Encounter (Signed)
I spoke with patient about results and she verbalized understanding and had no questions. Will make BQ aware

## 2014-09-08 NOTE — Telephone Encounter (Signed)
Pt had PET scan done 09/02/14. BQ is on vacation. Pt is requesting results. Please advise Dr. Annamaria Boots.  --results printed w/ message. thanks

## 2014-09-14 ENCOUNTER — Telehealth: Payer: Self-pay

## 2014-09-14 DIAGNOSIS — R911 Solitary pulmonary nodule: Secondary | ICD-10-CM

## 2014-09-14 NOTE — Telephone Encounter (Signed)
Pt aware of results and recs.  Order placed for ct chest in 6 months.  Nothing further needed.

## 2014-09-14 NOTE — Telephone Encounter (Signed)
-----   Message from Juanito Doom, MD sent at 09/14/2014  3:22 AM EDT ----- A, Please let her know that her PET scan looked OK which is great.  She will need another CT chest in 6 months to make sure the nodule has not changed.  Please order. Thanks B

## 2014-10-07 ENCOUNTER — Ambulatory Visit (INDEPENDENT_AMBULATORY_CARE_PROVIDER_SITE_OTHER): Payer: Medicare Other | Admitting: Pulmonary Disease

## 2014-10-07 ENCOUNTER — Encounter: Payer: Self-pay | Admitting: Pulmonary Disease

## 2014-10-07 VITALS — BP 122/78 | HR 78 | Temp 97.8°F | Ht 67.0 in | Wt 147.0 lb

## 2014-10-07 DIAGNOSIS — R911 Solitary pulmonary nodule: Secondary | ICD-10-CM | POA: Diagnosis not present

## 2014-10-07 DIAGNOSIS — J9601 Acute respiratory failure with hypoxia: Secondary | ICD-10-CM

## 2014-10-07 DIAGNOSIS — J441 Chronic obstructive pulmonary disease with (acute) exacerbation: Secondary | ICD-10-CM | POA: Diagnosis not present

## 2014-10-07 DIAGNOSIS — J440 Chronic obstructive pulmonary disease with acute lower respiratory infection: Secondary | ICD-10-CM

## 2014-10-07 DIAGNOSIS — J209 Acute bronchitis, unspecified: Secondary | ICD-10-CM

## 2014-10-07 LAB — PULMONARY FUNCTION TEST
DL/VA % pred: 26 %
DL/VA: 1.36 ml/min/mmHg/L
DLCO UNC % PRED: 25 %
DLCO UNC: 7.55 ml/min/mmHg
FEF 25-75 Post: 0.41 L/sec
FEF 25-75 Pre: 0.36 L/sec
FEF2575-%Change-Post: 15 %
FEF2575-%PRED-PRE: 16 %
FEF2575-%Pred-Post: 19 %
FEV1-%Change-Post: 6 %
FEV1-%PRED-POST: 31 %
FEV1-%Pred-Pre: 29 %
FEV1-Post: 0.82 L
FEV1-Pre: 0.77 L
FEV1FVC-%Change-Post: 0 %
FEV1FVC-%Pred-Pre: 42 %
FEV6-%Change-Post: 7 %
FEV6-%PRED-POST: 68 %
FEV6-%PRED-PRE: 64 %
FEV6-POST: 2.3 L
FEV6-Pre: 2.14 L
FEV6FVC-%CHANGE-POST: 0 %
FEV6FVC-%PRED-PRE: 95 %
FEV6FVC-%Pred-Post: 95 %
FVC-%Change-Post: 6 %
FVC-%PRED-PRE: 67 %
FVC-%Pred-Post: 72 %
FVC-PRE: 2.36 L
FVC-Post: 2.51 L
Post FEV1/FVC ratio: 33 %
Post FEV6/FVC ratio: 92 %
Pre FEV1/FVC ratio: 33 %
Pre FEV6/FVC Ratio: 91 %
RV % pred: 180 %
RV: 4.27 L
TLC % pred: 125 %
TLC: 7.07 L

## 2014-10-07 NOTE — Patient Instructions (Signed)
Tried taking Spiriva 2 puffs in the morning them at her feel Continue taking Advair twice a day Follow-up 6 months or sooner if needed

## 2014-10-07 NOTE — Assessment & Plan Note (Signed)
This seems to be improving. For now I would like for her to continue 2 L of oxygen continuously. However, we will keep a close monitor on it as I believe that her oxygenation is improving the further she gets out from her hospital visit.

## 2014-10-07 NOTE — Progress Notes (Signed)
Subjective:    Patient ID: Courtney Ayers, female    DOB: Jul 05, 1945, 69 y.o.   MRN: [id number]  Synopsis: Courtney Ayers was admitted for COPD in 2016 She had a PFT on 09/2014 which showed an FEV1 of 0.81L (31% pred) CT chest in 07/2014 showed significant emphysema and a 1cm nodule PET CT 2015 showed the nodule was not FCG avid  HPI  Chief Complaint  Patient presents with  . Follow-up    Pt is here to discuss PFT results. Pt c/o of edema and prod cough with thick white mucus.    Dyspnea> has been manageable, has only used albuterol 3 times, has not used oxygen when she exerts herself; using oxygen at rest and while sleeping, not using it with exertion; she has been active around her house and in the yard without too much difficulty, sometimes she gets short of breath when she walks any more than 200-300 feet, she has to pace her self  Medicaitons> she continues to take Advair BID, albuterol as above  Cough> minimal, usually first thing in the morning, thick phlegm  Past Medical History  Diagnosis Date  . Allergic rhinitis, cause unspecified     seasonal  . Osteopenia   . OA (osteoarthritis)     B THUMB, LEFT HIP  . Adenomatous colon polyp   . Depressive disorder, not elsewhere classified 2006    some anxiety recurs when she has stopped med in past     Review of Systems  Constitutional: Negative for fever, chills and fatigue.  HENT: Negative for postnasal drip, rhinorrhea and sinus pressure.   Respiratory: Positive for cough and shortness of breath. Negative for wheezing.   Cardiovascular: Negative for chest pain, palpitations and leg swelling.       Objective:   Physical Exam Filed Vitals:   10/07/14 1402  BP: 122/78  Pulse: 78  Temp: 97.8 F (36.6 C)  TempSrc: Oral  Height: 5\' 7"  (1.702 m)  Weight: 147 lb (66.679 kg)  SpO2: 97%  RA  Gen: well appearing HENT: OP clear, TM's clear, neck supple PULM: minimal air movement CV: RRR, no mgr, trace edema GI: BS+,  soft, nontender Derm: no cyanosis or rash Psyche: normal mood and affect          Assessment & Plan:   Solitary pulmonary nodule I'm happy that her PET CT was negative.   Plan: Repeat non-con CT chest in 6 months   COPD (chronic obstructive pulmonary disease) with acute bronchitis 10/07/2014 PFT> RAtio 33%, FEV1 0.82 L (31% pred), TLC 7.07 L (125% pred), DLCO 7.55 (25% pred)  Today we saw the pulmonary function testing which showed very severe airflow obstruction. This is not a surprise. However, she has been doing well and is remaining active at home.  Plan: -Because of the severity of her airflow obstruction I'm going to have her try Kariva in addition to Advair to see if it gives her symptomatic benefit -Continue Advair twice a day -Flu shot in the fall -Follow-up 6 months   Acute respiratory failure with hypoxia This seems to be improving. For now I would like for her to continue 2 L of oxygen continuously. However, we will keep a close monitor on it as I believe that her oxygenation is improving the further she gets out from her hospital visit.     Updated Medication List Outpatient Encounter Prescriptions as of 10/07/2014  Medication Sig  . b complex vitamins tablet Take 1 tablet by mouth  daily.    . Calcium-Vitamin D-Vitamin K (VIACTIV) [id number] MG-UNT-MCG CHEW Chew 2 each by mouth daily.  . citalopram (CELEXA) 10 MG tablet take 1 tablet by mouth once daily  . fluticasone (FLONASE) 50 MCG/ACT nasal spray Place 2 sprays into both nostrils daily.  . Fluticasone-Salmeterol (ADVAIR DISKUS) 250-50 MCG/DOSE AEPB Inhale 1 puff into the lungs 2 (two) times daily.  [name] guaiFENesin (MUCINEX) 600 MG 12 hr tablet Take 600 mg by mouth at bedtime.  [name] ibuprofen (ADVIL,MOTRIN) 200 MG tablet Take 400 mg by mouth every 6 (six) hours as needed for mild pain.   [name] loratadine (CLARITIN) 10 MG tablet Take 1 tablet (10 mg total) by mouth daily.  [name] PRESCRIPTION MEDICATION Asthalin  inhaler- use 2 puffs prn sob  . vitamin B-12 (CYANOCOBALAMIN) 100 MCG tablet Take 100 mcg by mouth daily.   . vitamin C (ASCORBIC ACID) 500 MG tablet Take 1,000 mg by mouth daily.    . vitamin E 400 UNIT capsule Take 400 Units by mouth daily.

## 2014-10-07 NOTE — Assessment & Plan Note (Signed)
10/07/2014 PFT> RAtio 33%, FEV1 0.82 L (31% pred), TLC 7.07 L (125% pred), DLCO 7.55 (25% pred)  Today we saw the pulmonary function testing which showed very severe airflow obstruction. This is not a surprise. However, she has been doing well and is remaining active at home.  Plan: -Because of the severity of her airflow obstruction I'm going to have her try Minerva Fester in addition to Advair to see if it gives her symptomatic benefit -Continue Advair twice a day -Flu shot in the fall -Follow-up 6 months

## 2014-10-07 NOTE — Progress Notes (Signed)
PFT done today. 

## 2014-10-07 NOTE — Assessment & Plan Note (Signed)
I'm happy that her PET CT was negative.   Plan: Repeat non-con CT chest in 6 months

## 2014-10-10 ENCOUNTER — Ambulatory Visit (INDEPENDENT_AMBULATORY_CARE_PROVIDER_SITE_OTHER): Payer: Medicare Other | Admitting: Family Medicine

## 2014-10-10 ENCOUNTER — Encounter: Payer: Self-pay | Admitting: Family Medicine

## 2014-10-10 VITALS — BP 126/80 | HR 68 | Ht 68.0 in | Wt 145.4 lb

## 2014-10-10 DIAGNOSIS — J301 Allergic rhinitis due to pollen: Secondary | ICD-10-CM

## 2014-10-10 DIAGNOSIS — M8580 Other specified disorders of bone density and structure, unspecified site: Secondary | ICD-10-CM | POA: Diagnosis not present

## 2014-10-10 DIAGNOSIS — J440 Chronic obstructive pulmonary disease with acute lower respiratory infection: Principal | ICD-10-CM

## 2014-10-10 DIAGNOSIS — J441 Chronic obstructive pulmonary disease with (acute) exacerbation: Secondary | ICD-10-CM | POA: Diagnosis not present

## 2014-10-10 DIAGNOSIS — J209 Acute bronchitis, unspecified: Secondary | ICD-10-CM

## 2014-10-10 NOTE — Progress Notes (Signed)
   Subjective:    Patient ID: Courtney Ayers, female    DOB: 1945/06/24, 69 y.o.   MRN: [id number]  HPI She is here for an interval evaluation. She was seen recently by pulmonary. That note was reviewed. She is now on Spiriva as well as Advair. She also has underlying allergies and continues on Flonase as well as Claritin. In general she is doing well. She does not use her oxygen for ADLs does use it at night. He also takes it with her while she is out running errands. She does have a history of osteopenia and does plan to get back into an exercise program. Psychologically she seems to be doing quite nicely.   Review of Systems     Objective:   Physical Exam Alert and in no distress. Cardiac exam shows regular rhythm without murmurs or gallops. Lungs clear to auscultation. Pulse ox was 98.       Assessment & Plan:  COPD (chronic obstructive pulmonary disease) with acute bronchitis  Osteopenia  Allergic rhinitis due to pollen she will continue on her medications. Encouraged her to use oxygen as much is possible especially if she notes her pulse ox dropping down below 92. She does have a pulse oximeter at home. Also strongly encouraged her to get involved in an exercise program. Discussed night lights with her to help with ambulation and hopefully keep her from falling.

## 2014-10-10 NOTE — Progress Notes (Signed)
Done

## 2014-10-18 ENCOUNTER — Telehealth: Payer: Self-pay | Admitting: Pulmonary Disease

## 2014-10-18 MED ORDER — TIOTROPIUM BROMIDE MONOHYDRATE 18 MCG IN CAPS: 18.0000 ug | ORAL_CAPSULE | Freq: Every day | RESPIRATORY_TRACT | Status: DC

## 2014-10-18 NOTE — Telephone Encounter (Signed)
Rx refilled.  Patient notified. Nothing further needed.

## 2015-01-10 ENCOUNTER — Other Ambulatory Visit: Payer: Self-pay

## 2015-01-10 MED ORDER — TIOTROPIUM BROMIDE MONOHYDRATE 18 MCG IN CAPS: 18.0000 ug | ORAL_CAPSULE | Freq: Every day | RESPIRATORY_TRACT | Status: DC

## 2015-03-08 ENCOUNTER — Other Ambulatory Visit (INDEPENDENT_AMBULATORY_CARE_PROVIDER_SITE_OTHER): Payer: Medicare Other

## 2015-03-08 ENCOUNTER — Telehealth: Payer: Self-pay | Admitting: Family Medicine

## 2015-03-08 DIAGNOSIS — Z23 Encounter for immunization: Secondary | ICD-10-CM

## 2015-03-08 DIAGNOSIS — Z609 Problem related to social environment, unspecified: Secondary | ICD-10-CM

## 2015-03-08 DIAGNOSIS — Z1159 Encounter for screening for other viral diseases: Secondary | ICD-10-CM

## 2015-03-08 DIAGNOSIS — Z7289 Other problems related to lifestyle: Secondary | ICD-10-CM | POA: Diagnosis not present

## 2015-03-08 NOTE — Telephone Encounter (Signed)
Give her the flu shot and to hepatitis C screening

## 2015-03-08 NOTE — Telephone Encounter (Signed)
Pt sent a message thru [place]. She wanted a flu shot and Hepatitis screening. Pt is coming in today for flu shot. I informed her I would refer the Hepatitis screening issue to [facility]. Please advise pt on that issue. Pt can be reached at [phone].

## 2015-03-08 NOTE — Telephone Encounter (Signed)
Put in future order for hep c.

## 2015-03-09 LAB — HEPATITIS C ANTIBODY: HCV AB: NEGATIVE

## 2015-03-14 ENCOUNTER — Ambulatory Visit (INDEPENDENT_AMBULATORY_CARE_PROVIDER_SITE_OTHER)
Admission: RE | Admit: 2015-03-14 | Discharge: 2015-03-14 | Disposition: A | Discharge status: Home / Self Care | Payer: Medicare Other | Source: Ambulatory Visit | Attending: Pulmonary Disease | Admitting: Pulmonary Disease

## 2015-03-14 DIAGNOSIS — R918 Other nonspecific abnormal finding of lung field: Secondary | ICD-10-CM | POA: Diagnosis not present

## 2015-03-14 DIAGNOSIS — R911 Solitary pulmonary nodule: Secondary | ICD-10-CM

## 2015-03-14 DIAGNOSIS — R05 Cough: Secondary | ICD-10-CM | POA: Diagnosis not present

## 2015-03-20 ENCOUNTER — Telehealth: Payer: Self-pay | Admitting: Pulmonary Disease

## 2015-03-20 DIAGNOSIS — R911 Solitary pulmonary nodule: Secondary | ICD-10-CM

## 2015-03-20 NOTE — Telephone Encounter (Signed)
Notes Recorded by Len Blalock, CMA on 03/20/2015 at 2:23 PM lmtcb X1 to relay results. Will order CT after speaking to pt. ------  Notes Recorded by Juanito Doom, MD on 03/20/2015 at 12:52 PM A, Please let her know that the radiologist feels that there has been no change in nodule previously seen (great news), but there are multiple new nodules that the radiologist feels may just be inflammatory. To be safe, they have recommended a repeat CT chest in 6-8 weeks. Please order. Thanks B ------------------- Pt is aware of results. Order has been placed for CT in 6 weeks. Nothing further was needed.

## 2015-03-21 ENCOUNTER — Telehealth: Payer: Self-pay | Admitting: Pulmonary Disease

## 2015-03-21 DIAGNOSIS — J209 Acute bronchitis, unspecified: Secondary | ICD-10-CM

## 2015-03-21 DIAGNOSIS — J440 Chronic obstructive pulmonary disease with acute lower respiratory infection: Principal | ICD-10-CM

## 2015-03-21 MED ORDER — FLUTICASONE-SALMETEROL 250-50 MCG/DOSE IN AEPB: 1.0000 | INHALATION_SPRAY | Freq: Two times a day (BID) | RESPIRATORY_TRACT | Status: DC

## 2015-03-21 MED ORDER — TIOTROPIUM BROMIDE MONOHYDRATE 18 MCG IN CAPS: 18.0000 ug | ORAL_CAPSULE | Freq: Every day | RESPIRATORY_TRACT | Status: DC

## 2015-03-21 NOTE — Telephone Encounter (Signed)
Patient calling to request that we send her medication to [facility] in order to get her medications cheaper.   Advair 250/50 can get 3 for $195.00 and Spiriva Handihaler can get 3 for $329.00 [email or web address] Phone: [phone]  Patient is currently living off of Social Security benefits.  Advised patient about Patient Assistance program for Advair and Spiriva.   Patient said she would be interested in applying for these benefits. Mailed applications to patient to be completed.  Patient says that she will fill out applications and drop them off at our office for [place] to fax.  To Caryl Pina to follow up.

## 2015-03-21 NOTE — Telephone Encounter (Signed)
The number provided below is a fax #, not a phone number.   rx printed, will hold until BQ returns to clinic to sign.

## 2015-03-23 MED ORDER — TIOTROPIUM BROMIDE MONOHYDRATE 18 MCG IN CAPS: 18.0000 ug | ORAL_CAPSULE | Freq: Every day | RESPIRATORY_TRACT | Status: DC

## 2015-03-23 MED ORDER — FLUTICASONE-SALMETEROL 250-50 MCG/DOSE IN AEPB: 1.0000 | INHALATION_SPRAY | Freq: Two times a day (BID) | RESPIRATORY_TRACT | Status: DC

## 2015-03-23 NOTE — Telephone Encounter (Signed)
rx has been printed and given to BQ to sign

## 2015-03-24 ENCOUNTER — Telehealth: Payer: Self-pay | Admitting: Pulmonary Disease

## 2015-03-24 MED ORDER — PREDNISONE 10 MG PO TABS: ORAL_TABLET | ORAL | Status: DC

## 2015-03-24 MED ORDER — DOXYCYCLINE HYCLATE 100 MG PO TABS: 100.0000 mg | ORAL_TABLET | Freq: Two times a day (BID) | ORAL | Status: DC

## 2015-03-24 NOTE — Telephone Encounter (Signed)
Prednisone: Take 40mg  po daily for 3 days, then take 30mg  po daily for 3 days, then take 20mg  po daily for two days, then take 10mg  po daily for 2 days Doxycycline 100mg  po bid x5 days, take with probiotic

## 2015-03-24 NOTE — Telephone Encounter (Signed)
Pt is aware of recs below. RX's sent in. Nothing further needed

## 2015-03-24 NOTE — Telephone Encounter (Signed)
Pt says that for 3 days, she has had a head cold, it has now gone into her chest.  It is making it difficult for her to breath.  Coughing up phlegm, but hard to cough it out.  Feeling tight and fullness in chest. No fever.  Blowing infection out of sinuses with nedi pot.  She says drainage is going down throat into chest.   No Known Allergies  Pharmacy: [facility]

## 2015-03-30 MED ORDER — FLUTICASONE-SALMETEROL 250-50 MCG/DOSE IN AEPB: 1.0000 | INHALATION_SPRAY | Freq: Two times a day (BID) | RESPIRATORY_TRACT | Status: DC

## 2015-03-30 MED ORDER — TIOTROPIUM BROMIDE MONOHYDRATE 18 MCG IN CAPS: 18.0000 ug | ORAL_CAPSULE | Freq: Every day | RESPIRATORY_TRACT | Status: DC

## 2015-03-30 NOTE — Addendum Note (Signed)
Addended by: Len Blalock on: 03/30/2015 05:06 PM   Modules accepted: Orders, Medications

## 2015-03-30 NOTE — Telephone Encounter (Signed)
As this encounter was closed while I was gone and there is no documentation as to what happened to this rx, it has been reprinted and placed in BQ's look at.  Will fax when it is returned to me.

## 2015-04-04 NOTE — Telephone Encounter (Signed)
rx has been signed and faxed.  Nothing further needed.

## 2015-04-06 ENCOUNTER — Telehealth: Payer: Self-pay | Admitting: Pulmonary Disease

## 2015-04-07 NOTE — Telephone Encounter (Signed)
All forms received.

## 2015-04-07 NOTE — Telephone Encounter (Signed)
Pt left more current information. Given to [place] as well

## 2015-04-12 NOTE — Telephone Encounter (Signed)
Forms have been faxed.  Will hold to follow up

## 2015-04-24 ENCOUNTER — Telehealth: Payer: Self-pay

## 2015-04-24 ENCOUNTER — Telehealth: Payer: Self-pay | Admitting: Pulmonary Disease

## 2015-04-24 NOTE — Telephone Encounter (Signed)
LM x 1 for pt 

## 2015-04-24 NOTE — Telephone Encounter (Signed)
Caryl Pina, have you received anything on pt yet?

## 2015-04-24 NOTE — Telephone Encounter (Signed)
I talked with pt about this [facility] she doesn't use them i have faxed back form stating to stop sending

## 2015-04-25 NOTE — Telephone Encounter (Signed)
Called and spoke to pt. Pt stated she received a letter from [place] stating they are needing the original form with the rx for spiriva faxed back to the number listed on the pt assistance form.   Caryl Pina please advise. Thanks.

## 2015-04-26 ENCOUNTER — Ambulatory Visit: Payer: Medicare Other | Admitting: Adult Health

## 2015-04-26 MED ORDER — TIOTROPIUM BROMIDE MONOHYDRATE 18 MCG IN CAPS: 18.0000 ug | ORAL_CAPSULE | Freq: Every day | RESPIRATORY_TRACT | Status: DC

## 2015-04-26 NOTE — Telephone Encounter (Signed)
Courtney Pina, do you still have these forms?

## 2015-04-26 NOTE — Telephone Encounter (Signed)
rx printed and left in BQ's look-at for signature.  Original forms are being held in my pink 3-ring binder until I receive signed rx back from BQ.

## 2015-04-27 NOTE — Telephone Encounter (Signed)
Called and spoke with pt. Informed her that forms were received and when BQ returns to the office rx will be signed and faxed. Pt voiced understanding and had no further questions.

## 2015-05-02 ENCOUNTER — Other Ambulatory Visit: Payer: Self-pay | Admitting: Pulmonary Disease

## 2015-05-02 ENCOUNTER — Ambulatory Visit (INDEPENDENT_AMBULATORY_CARE_PROVIDER_SITE_OTHER)
Admission: RE | Admit: 2015-05-02 | Discharge: 2015-05-02 | Disposition: A | Discharge status: Home / Self Care | Payer: Medicare Other | Source: Ambulatory Visit | Attending: Pulmonary Disease | Admitting: Pulmonary Disease

## 2015-05-02 DIAGNOSIS — R918 Other nonspecific abnormal finding of lung field: Secondary | ICD-10-CM | POA: Diagnosis not present

## 2015-05-02 DIAGNOSIS — R911 Solitary pulmonary nodule: Secondary | ICD-10-CM | POA: Diagnosis not present

## 2015-05-02 DIAGNOSIS — J432 Centrilobular emphysema: Secondary | ICD-10-CM | POA: Diagnosis not present

## 2015-05-02 NOTE — Telephone Encounter (Signed)
Form and rx faxed back to [facility].  Nothing further needed.

## 2015-05-02 NOTE — Telephone Encounter (Signed)
Courtney Ayers, are you aware if this has been taken care of?

## 2015-05-24 ENCOUNTER — Encounter: Payer: Self-pay | Admitting: Pulmonary Disease

## 2015-05-24 ENCOUNTER — Ambulatory Visit (INDEPENDENT_AMBULATORY_CARE_PROVIDER_SITE_OTHER): Payer: Medicare Other | Admitting: Pulmonary Disease

## 2015-05-24 VITALS — BP 122/84 | HR 81 | Ht 68.0 in | Wt 144.8 lb

## 2015-05-24 DIAGNOSIS — J440 Chronic obstructive pulmonary disease with acute lower respiratory infection: Secondary | ICD-10-CM | POA: Diagnosis not present

## 2015-05-24 DIAGNOSIS — R911 Solitary pulmonary nodule: Secondary | ICD-10-CM

## 2015-05-24 DIAGNOSIS — J209 Acute bronchitis, unspecified: Secondary | ICD-10-CM

## 2015-05-24 NOTE — Progress Notes (Signed)
Subjective:    Patient ID: Courtney Ayers, female    DOB: [birth year], 69 y.o.   MRN: [id number]  Synopsis: Courtney Ayers was admitted for COPD in 2016 She had a PFT on 09/2014 which showed an FEV1 of 0.81L (31% pred) CT chest in 07/2014 showed significant emphysema and a 1cm nodule PET CT 2015 showed the nodule was not FCG avid November 2016 CT chest: No change in previously seen nodules, in fact most are smaller, but there is a new 1.4 cm sub-solid nodule seen, recommend follow-up 3 months  HPI  Chief Complaint  Patient presents with  . Follow-up    pt following for COPD: pt states she is doing about the same sinnce she last been here. pt states she deff has to foucus on it more, with moving around she needs to work on taking deep breaths. pt states her SOB and cough isnt any different, seems like once she starts moving around she can bring up mucous slight yellowish in color and once that comes up she feels like she can breath a little easier. pt using O2 qhs 2.5 LPM.   Courtney Ayers says that her breathing hasn't drastically improved.  She paces herself because of dyspnea.  She has no leg swelling.  She sleeps with cats and cleans out the litter box frequently, it is in her bedroom.  She notes it is dusty there sometime. She is sleeping with oxygen only, but nothing more than that right now. She continues taking Advair and Spiriva. She had a flare up a few weeks aback and we had to call in prednisone and an antibiotic.  That worked.  Past Medical History  Diagnosis Date  . Allergic rhinitis, cause unspecified     seasonal  . Osteopenia   . OA (osteoarthritis)     B THUMB, LEFT HIP  . Adenomatous colon polyp   . Depressive disorder, not elsewhere classified 2006    some anxiety recurs when she has stopped med in past     Review of Systems  Constitutional: Negative for fever, chills and fatigue.  HENT: Negative for postnasal drip, rhinorrhea and sinus pressure.   Respiratory: Positive  for cough and shortness of breath. Negative for wheezing.   Cardiovascular: Negative for chest pain, palpitations and leg swelling.       Objective:   Physical Exam Filed Vitals:   05/24/15 1055  BP: 122/84  Pulse: 81  Height: 5\' 8"  (1.727 m)  Weight: 144 lb 12.8 oz (65.681 kg)  SpO2: 97%  RA  Gen: well appearing HENT: OP clear, TM's clear, neck supple PULM: minimal air movement CV: RRR, no mgr, trace edema GI: BS+, soft, nontender Derm: no cyanosis or rash Psyche: normal mood and affect   04/2015 CT chest images personally reviewed showing mod to severe emphysema bilaterally with scattered nodules, the largest being in the RUL       Assessment & Plan:   COPD (chronic obstructive pulmonary disease) with acute bronchitis ([place]) This has been a stable interval for [name]. She remains tolerant of her inhaled therapies and has not had an exacerbation with the exception of one minor 1 a few months back.  Her flu and pneumonia vaccines are up-to-date  Plan: Continue Spiriva and Advair Stay active Follow-up 6 months or sooner if needed  Solitary pulmonary nodule She continues to have multiple scattered nodules which wax and wane throughout the course of the year. Every time we image them they seem to regress but  then be accompanied by a new larger one in an alternate lobe or lung. This is been a repeated cycle this year. I explained to her that because of the imaging characteristics from reluctant to hold off on serial imaging because she is at high risk for lung cancer. While the serial imaged is certainly intended to ensure there is no evidence of malignancy, I do fear that we are watching a benign process. She may ultimately need a bronchoscopy to rule out some sort of infectious cause. Right now, she doesn't have symptoms of another disease to suggest an active infection or inflammatory etiology.  The easy answer would be to perform a biopsy but she has such a high of degree  of emphysema she is at very high risk for a pneumothorax or complication from a biopsy.  Plan: Repeat CT chest in February 2016 per radiology recommendations May need a bronchoscopy next year if this process continues  > 50% of visit spent face to face in a 25 minute visit  Updated Medication List Outpatient Encounter Prescriptions as of 05/24/2015  Medication Sig  . citalopram (CELEXA) 10 MG tablet take 1 tablet by mouth once daily  . fluticasone (FLONASE) 50 MCG/ACT nasal spray Place 2 sprays into both nostrils daily.  . Fluticasone-Salmeterol (ADVAIR) 250-50 MCG/DOSE AEPB Inhale 1 puff into the lungs every 12 (twelve) hours.  [name] guaiFENesin (MUCINEX) 600 MG 12 hr tablet Take 600 mg by mouth at bedtime.  [name] ibuprofen (ADVIL,MOTRIN) 200 MG tablet Take 400 mg by mouth every 6 (six) hours as needed for mild pain.   [name] loratadine (CLARITIN) 10 MG tablet Take 1 tablet (10 mg total) by mouth daily.  [name] tiotropium (SPIRIVA) 18 MCG inhalation capsule Place 1 capsule (18 mcg total) into inhaler and inhale daily. 2 puffs once a day  . UNABLE TO FIND Med Name: Asthalin 177mcg - 2 puff PRN Pt gets through [place]  . PRESCRIPTION MEDICATION Reported on 05/24/2015  . [DISCONTINUED] b complex vitamins tablet Take 1 tablet by mouth daily. Reported on 05/24/2015  . [DISCONTINUED] Calcium-Vitamin D-Vitamin K (VIACTIV) [id number] MG-UNT-MCG CHEW Chew 2 each by mouth daily. Reported on 05/24/2015  . [DISCONTINUED] doxycycline (VIBRA-TABS) 100 MG tablet Take 1 tablet (100 mg total) by mouth 2 (two) times daily. (Patient not taking: Reported on 05/24/2015)  . [DISCONTINUED] predniSONE (DELTASONE) 10 MG tablet Take 40mg  po daily for 3 days, then 30mg  po daily for 3 days, then 20mg  po daily for two days, then take 10mg  po daily for 2 days (Patient not taking: Reported on 05/24/2015)  . [DISCONTINUED] vitamin B-12 (CYANOCOBALAMIN) 100 MCG tablet Take 100 mcg by mouth daily. Reported on 05/24/2015  . [DISCONTINUED]  vitamin C (ASCORBIC ACID) 500 MG tablet Take 1,000 mg by mouth daily. Reported on 05/24/2015  . [DISCONTINUED] vitamin E 400 UNIT capsule Take 400 Units by mouth daily. Reported on 05/24/2015   No facility-administered encounter medications on file as of 05/24/2015.

## 2015-05-24 NOTE — Assessment & Plan Note (Signed)
She continues to have multiple scattered nodules which wax and wane throughout the course of the year. Every time we image them they seem to regress but then be accompanied by a new larger one in an alternate lobe or lung. This is been a repeated cycle this year. I explained to her that because of the imaging characteristics from reluctant to hold off on serial imaging because she is at high risk for lung cancer. While the serial imaged is certainly intended to ensure there is no evidence of malignancy, I do fear that we are watching a benign process. She may ultimately need a bronchoscopy to rule out some sort of infectious cause. Right now, she doesn't have symptoms of another disease to suggest an active infection or inflammatory etiology.  The easy answer would be to perform a biopsy but she has such a high of degree of emphysema she is at very high risk for a pneumothorax or complication from a biopsy.  Plan: Repeat CT chest in February 2016 per radiology recommendations May need a bronchoscopy next year if this process continues

## 2015-05-24 NOTE — Assessment & Plan Note (Signed)
This has been a stable interval for [facility]. She remains tolerant of her inhaled therapies and has not had an exacerbation with the exception of one minor 1 a few months back.  Her flu and pneumonia vaccines are up-to-date  Plan: Continue Spiriva and Advair Stay active Follow-up 6 months or sooner if needed

## 2015-05-24 NOTE — Patient Instructions (Signed)
Take the Advair and Spiriva as you are doing We will call you with the results of the CT scan in February We will see you back in 6 months or sooner if needed

## 2015-06-13 ENCOUNTER — Ambulatory Visit (INDEPENDENT_AMBULATORY_CARE_PROVIDER_SITE_OTHER): Payer: Medicare Other | Admitting: Family Medicine

## 2015-06-13 ENCOUNTER — Encounter: Payer: Self-pay | Admitting: Family Medicine

## 2015-06-13 ENCOUNTER — Ambulatory Visit: Payer: Self-pay | Admitting: Family Medicine

## 2015-06-13 VITALS — BP 124/86 | Wt 145.0 lb

## 2015-06-13 DIAGNOSIS — M25551 Pain in right hip: Secondary | ICD-10-CM | POA: Diagnosis not present

## 2015-06-13 NOTE — Progress Notes (Signed)
   Subjective:    Patient ID: Courtney Ayers, female    DOB: May 13, 1946, 70 y.o.   MRN: [id number]  HPI Complains of the recent onset of right hip and medial thigh pain. She notes this especially when she rotates her hip either internally or externally. She has no back pain.   Review of Systems     Objective:   Physical Exam Internal and external rotation of the hip caused a great amount of pain. Flexion was not a problem.       Assessment & Plan:  Right hip pain - Plan: DG HIP UNILAT WITH PELVIS 2-3 VIEWS RIGHT  Splane that this is most likely arthritis related. Follow-up pending x-ray result.

## 2015-06-14 ENCOUNTER — Ambulatory Visit
Admission: RE | Admit: 2015-06-14 | Discharge: 2015-06-14 | Disposition: A | Discharge status: Home / Self Care | Payer: Medicare Other | Source: Ambulatory Visit | Attending: Family Medicine | Admitting: Family Medicine

## 2015-06-14 DIAGNOSIS — M1611 Unilateral primary osteoarthritis, right hip: Secondary | ICD-10-CM | POA: Diagnosis not present

## 2015-06-14 DIAGNOSIS — M25551 Pain in right hip: Secondary | ICD-10-CM

## 2015-06-15 ENCOUNTER — Telehealth: Payer: Self-pay

## 2015-06-15 NOTE — Telephone Encounter (Signed)
Schedule her with somebody over at [facility]

## 2015-06-15 NOTE — Telephone Encounter (Signed)
Patient does not have an ortho preference.  She is only available mid day.  Please place referral.

## 2015-06-15 NOTE — Telephone Encounter (Signed)
-----   Message from [name], MD sent at 06/14/2015 12:30 PM EST ----- Set her up to see an orthopedic surgeon for possible hip injection area I already talked to her about this.

## 2015-06-16 ENCOUNTER — Telehealth: Payer: Self-pay

## 2015-06-16 NOTE — Telephone Encounter (Signed)
Called [facility].  Left message for them to contact patient to schedule an appointment.

## 2015-06-16 NOTE — Telephone Encounter (Signed)
Courtney Ayers with Dr Kennon Portela office at [facility] ortho called requesting we fax office notes and order for hip injections to [phone].  Notes faxed.

## 2015-06-19 ENCOUNTER — Telehealth: Payer: Self-pay | Admitting: Internal Medicine

## 2015-06-19 DIAGNOSIS — F411 Generalized anxiety disorder: Secondary | ICD-10-CM

## 2015-06-19 MED ORDER — CITALOPRAM HYDROBROMIDE 10 MG PO TABS: ORAL_TABLET | ORAL | Status: DC

## 2015-06-19 NOTE — Telephone Encounter (Signed)
Refill request for citalopram 10mg  #90 to [facility] store [address]

## 2015-06-21 ENCOUNTER — Encounter: Payer: Self-pay | Admitting: Family Medicine

## 2015-06-26 ENCOUNTER — Telehealth: Payer: Self-pay

## 2015-06-26 NOTE — Telephone Encounter (Signed)
Pt called and said they had not heard anything about their referral appt. There is a TC saying the necessary info was sent and [facility] was supposed to be calling her to schedule her but she hasn't heard anything yet. She says her condition isn't getting any better and would like to know what was going on? She would like a call back from our office.

## 2015-06-26 NOTE — Telephone Encounter (Signed)
Call [facility] orthopedics and see if you can set her up an appointment

## 2015-06-27 NOTE — Telephone Encounter (Signed)
Called patient, advised that I have left msg with [facility] to contact one of [place] to advise of status.

## 2015-06-27 NOTE — Telephone Encounter (Signed)
Called [facility] and left msg for them (Amy Bishop)to call the patient or [place] about the status of the referral that was sent to them on 06-16-15.

## 2015-06-28 NOTE — Telephone Encounter (Signed)
Amy from [place] called back and they state never got anything. i have refaxed it and they will give [place] a call.

## 2015-06-29 NOTE — Telephone Encounter (Signed)
[facility] called back today and said they only got part of referral information. i have refaxed over everything twice to ensure they get it

## 2015-06-29 NOTE — Telephone Encounter (Signed)
Pt is scheduled on 07/10/15 with Dr. Ernestina Patches at [facility] for hip injection. She would like you to call and talk to her as she has concerns about this procedure and doesn't really know what the images showed and what she is going into. Please advise patient

## 2015-07-05 DIAGNOSIS — M8589 Other specified disorders of bone density and structure, multiple sites: Secondary | ICD-10-CM | POA: Diagnosis not present

## 2015-07-05 DIAGNOSIS — Z1231 Encounter for screening mammogram for malignant neoplasm of breast: Secondary | ICD-10-CM | POA: Diagnosis not present

## 2015-07-05 LAB — HM DEXA SCAN

## 2015-07-05 LAB — HM MAMMOGRAPHY: HM MAMMO: NORMAL (ref 0–4)

## 2015-07-10 DIAGNOSIS — M1611 Unilateral primary osteoarthritis, right hip: Secondary | ICD-10-CM | POA: Diagnosis not present

## 2015-07-10 DIAGNOSIS — M25551 Pain in right hip: Secondary | ICD-10-CM | POA: Diagnosis not present

## 2015-07-10 DIAGNOSIS — M25552 Pain in left hip: Secondary | ICD-10-CM | POA: Diagnosis not present

## 2015-07-11 ENCOUNTER — Telehealth: Payer: Self-pay | Admitting: Family Medicine

## 2015-07-11 NOTE — Telephone Encounter (Signed)
Called pt and left message to call back. Need to give her message from Dr Redmond School that Bone Density results show osteopenia and she should take multi vitamin and continue to take vitamin D and Calcium.

## 2015-07-11 NOTE — Telephone Encounter (Signed)
Pt called back and I gave her the message.

## 2015-07-12 ENCOUNTER — Encounter: Payer: Self-pay | Admitting: Internal Medicine

## 2015-07-17 DIAGNOSIS — Z85828 Personal history of other malignant neoplasm of skin: Secondary | ICD-10-CM | POA: Diagnosis not present

## 2015-07-17 DIAGNOSIS — Z87898 Personal history of other specified conditions: Secondary | ICD-10-CM | POA: Diagnosis not present

## 2015-07-17 DIAGNOSIS — L821 Other seborrheic keratosis: Secondary | ICD-10-CM | POA: Diagnosis not present

## 2015-07-17 DIAGNOSIS — Z23 Encounter for immunization: Secondary | ICD-10-CM | POA: Diagnosis not present

## 2015-07-17 DIAGNOSIS — D225 Melanocytic nevi of trunk: Secondary | ICD-10-CM | POA: Diagnosis not present

## 2015-07-18 ENCOUNTER — Telehealth: Payer: Self-pay | Admitting: Pulmonary Disease

## 2015-07-18 NOTE — Telephone Encounter (Signed)
Spoke with pt, states she received a notice from [facility] stating that they need something from our office by the end of February to keep her 02 going, otherwise they will pick up her 02.  Pt could not further elaborate on what was needed. Called [facility], states that between month 9-12 of pt being on 02, pt's insurance needs patient to come in to be seen by our providers to document in an office visit note that pt is using 02 as prescribed and is benefiting from it.   Pt is not due to be seen again until June 2017.  Per [facility] this needs to be done by March.   Called pt, scheduled appt for Thursday for this documentation.  Nothing further needed.

## 2015-07-20 ENCOUNTER — Ambulatory Visit (INDEPENDENT_AMBULATORY_CARE_PROVIDER_SITE_OTHER): Payer: Medicare Other | Admitting: Pulmonary Disease

## 2015-07-20 ENCOUNTER — Encounter: Payer: Self-pay | Admitting: Pulmonary Disease

## 2015-07-20 VITALS — BP 142/88 | HR 81 | Ht 68.0 in | Wt 142.6 lb

## 2015-07-20 DIAGNOSIS — R911 Solitary pulmonary nodule: Secondary | ICD-10-CM

## 2015-07-20 DIAGNOSIS — J9611 Chronic respiratory failure with hypoxia: Secondary | ICD-10-CM

## 2015-07-20 DIAGNOSIS — J440 Chronic obstructive pulmonary disease with acute lower respiratory infection: Secondary | ICD-10-CM | POA: Diagnosis not present

## 2015-07-20 DIAGNOSIS — J209 Acute bronchitis, unspecified: Secondary | ICD-10-CM

## 2015-07-20 NOTE — Assessment & Plan Note (Signed)
Keep plans for CT chest later this month.

## 2015-07-20 NOTE — Assessment & Plan Note (Signed)
Today she ambulated 500 feet on room air and her O2 saturation never went lower than 94%. However, I think she still has nocturnal hypoxemia.  Plan: Will order overnight oximetry test on room air Instructed her that she does not need to use oxygen with exertion

## 2015-07-20 NOTE — Progress Notes (Signed)
Subjective:    Patient ID: Courtney Ayers, female    DOB: January 09, 1946, 70 y.o.   MRN: [id number]  Synopsis: Courtney Ayers was admitted for COPD in 2016 She had a PFT on 09/2014 which showed an FEV1 of 0.81L (31% pred) CT chest in 07/2014 showed significant emphysema and a 1cm nodule PET CT 2015 showed the nodule was not FCG avid November 2016 CT chest: No change in previously seen nodules, in fact most are smaller, but there is a new 1.4 cm sub-solid nodule seen, recommend follow-up 3 months  HPI  Chief Complaint  Patient presents with  . Follow-up    pt here per medicare guidelines for 02.  pt wearing 02 qhs and prn.  breathing is at baseline.     Courtney Ayers is here today to f/u the results of her oxygen level testing with ambulation.  She says that the oxygen dries out her nose and she is annoyed by it generally.  She has some nostril irritation in the mornings. She is only wearing it at night now.   Past Medical History  Diagnosis Date  . Allergic rhinitis, cause unspecified     seasonal  . Osteopenia   . OA (osteoarthritis)     B THUMB, LEFT HIP  . Adenomatous colon polyp   . Depressive disorder, not elsewhere classified 2006    some anxiety recurs when she has stopped med in past     Review of Systems  Constitutional: Negative for fever, chills and fatigue.  HENT: Negative for postnasal drip, rhinorrhea and sinus pressure.   Respiratory: Positive for cough and shortness of breath. Negative for wheezing.   Cardiovascular: Negative for chest pain, palpitations and leg swelling.       Objective:   Physical Exam Filed Vitals:   07/20/15 1348  BP: 142/88  Pulse: 81  Height: 5\' 8"  (1.727 m)  Weight: 142 lb 9.6 oz (64.683 kg)  SpO2: 98%  RA  Gen: well appearing HENT: OP clear, TM's clear, neck supple PULM: minimal air movement CV: RRR, no mgr, trace edema GI: BS+, soft, nontender Derm: no cyanosis or rash Psyche: normal mood and affect       Assessment & Plan:     COPD (chronic obstructive pulmonary disease) with acute bronchitis (H[facility]) This has been a stable interval for [place]. She is here today for a Medicare mandatory visit for hypoxemia.  Plan: Continue current inhaled therapies See hypoxemia below Follow-up 4 months  Solitary pulmonary nodule Keep plans for CT chest later this month.  Chronic respiratory failure with hypoxia (HCC) Today she ambulated 500 feet on room air and her O2 saturation never went lower than 94%. However, I think she still has nocturnal hypoxemia.  Plan: Will order overnight oximetry test on room air Instructed her that she does not need to use oxygen with exertion  > 50% of visit spent face to face in a 25 minute visit  Updated Medication List Outpatient Encounter Prescriptions as of 07/20/2015  Medication Sig  . Calcium Carbonate-Vitamin D (CALCIUM-VITAMIN D) 500-200 MG-UNIT tablet Take 1 tablet by mouth daily.  . citalopram (CELEXA) 10 MG tablet take 1 tablet by mouth once daily  . fluticasone (FLONASE) 50 MCG/ACT nasal spray Place 2 sprays into both nostrils daily.  . Fluticasone-Salmeterol (ADVAIR) 250-50 MCG/DOSE AEPB Inhale 1 puff into the lungs every 12 (twelve) hours.  [name] guaiFENesin (MUCINEX) 600 MG 12 hr tablet Take 600 mg by mouth at bedtime.  [name] ibuprofen (ADVIL,MOTRIN) 200  MG tablet Take 400 mg by mouth at bedtime and may repeat dose one time if needed.   . loratadine (CLARITIN) 10 MG tablet Take 1 tablet (10 mg total) by mouth daily.  . Multiple Vitamin (MULTIVITAMIN WITH MINERALS) TABS tablet Take 1 tablet by mouth daily.  [name] tiotropium (SPIRIVA) 18 MCG inhalation capsule Place 1 capsule (18 mcg total) into inhaler and inhale daily. 2 puffs once a day  . UNABLE TO FIND Med Name: Asthalin 12mcg - 2 puff PRN Pt gets through [place]   No facility-administered encounter medications on file as of 07/20/2015.

## 2015-07-20 NOTE — Patient Instructions (Signed)
Use oxygen at night We will check an overnight oximetry test Keep the appointment for the CT scan later this month We will see her back in 4 months or sooner if needed

## 2015-07-20 NOTE — Assessment & Plan Note (Signed)
This has been a stable interval for Courtney Ayers. She is here today for a Medicare mandatory visit for hypoxemia.  Plan: Continue current inhaled therapies See hypoxemia below Follow-up 4 months

## 2015-07-20 NOTE — Addendum Note (Signed)
Addended by: Len Blalock on: 07/20/2015 02:15 PM   Modules accepted: Orders

## 2015-07-24 ENCOUNTER — Other Ambulatory Visit: Payer: Self-pay

## 2015-07-24 MED ORDER — TIOTROPIUM BROMIDE MONOHYDRATE 18 MCG IN CAPS: 18.0000 ug | ORAL_CAPSULE | Freq: Every day | RESPIRATORY_TRACT | Status: DC

## 2015-08-02 ENCOUNTER — Ambulatory Visit (INDEPENDENT_AMBULATORY_CARE_PROVIDER_SITE_OTHER)
Admission: RE | Admit: 2015-08-02 | Discharge: 2015-08-02 | Disposition: A | Discharge status: Home / Self Care | Payer: Medicare Other | Source: Ambulatory Visit | Attending: Pulmonary Disease | Admitting: Pulmonary Disease

## 2015-08-02 DIAGNOSIS — R911 Solitary pulmonary nodule: Secondary | ICD-10-CM

## 2015-08-02 DIAGNOSIS — R918 Other nonspecific abnormal finding of lung field: Secondary | ICD-10-CM | POA: Diagnosis not present

## 2015-08-03 ENCOUNTER — Telehealth: Payer: Self-pay | Admitting: Pulmonary Disease

## 2015-08-03 DIAGNOSIS — J440 Chronic obstructive pulmonary disease with acute lower respiratory infection: Principal | ICD-10-CM

## 2015-08-03 DIAGNOSIS — J209 Acute bronchitis, unspecified: Secondary | ICD-10-CM

## 2015-08-03 NOTE — Telephone Encounter (Signed)
LMTCB for Bronch scheduling  Will hold in triage since msg is closed

## 2015-08-03 NOTE — Addendum Note (Signed)
Addended by: Rosana Berger on: 08/03/2015 02:33 PM   Modules accepted: Orders

## 2015-08-03 NOTE — Telephone Encounter (Signed)
Bronch scheduled for 2 pm at [facility] on Tues 09/05/15  Pt aware and labs to be completed here on 3/27 Will forward to BQ to make him aware

## 2015-08-03 NOTE — Telephone Encounter (Signed)
I called Courtney Ayers to discuss the results of her CT chest that showed waxing and waning nodules, the largest of which was in the left lower lobe.  While we don't think that this was malignant, we think this is likely an occult infection of some sort that should be evaluated by a bronchoscopy.  Will cc triage to arrange: Bronchoscopy at [place] next week, any day but Monday, preferably afternoon, no fluoro, no TB risk; she will need a CBC prior  [name], MD [place] [facility] Pager: [phone] Cell: [phone] After 3pm or if no response, call [phone]

## 2015-08-04 ENCOUNTER — Telehealth: Payer: Self-pay | Admitting: Pulmonary Disease

## 2015-08-04 DIAGNOSIS — J9611 Chronic respiratory failure with hypoxia: Secondary | ICD-10-CM

## 2015-08-04 NOTE — Telephone Encounter (Signed)
Patient calling because she has an appointment to have Bronchoscopy on 2/28 with Dr. Lake Bells.  She wants to know if Dr. Lake Bells can show her the images from the CT scan and explain them to her before the bronchoscopy on 2/28.  She said that she will be arriving for the bronch around 2pm and said she could come earlier if she needs too.  Dr. Lake Bells, please advise.

## 2015-08-04 NOTE — Telephone Encounter (Signed)
-----   Message from Juanito Doom, MD sent at 08/03/2015  1:24 PM EST ----- Caryl Pina,  Please let her know that her overnight oximetry test on room air showed that she does not need to use oxygen at night anymore.  Thanks, Ruby Cola

## 2015-08-04 NOTE — Telephone Encounter (Signed)
LMTCB

## 2015-08-04 NOTE — Telephone Encounter (Signed)
Yes I can. Tell her to show up and I will show her in the bronchoscopy suite.  The will prep her as usual with the IV etc, but before we put her to sleep I will be there to talk to her and show her the images on a computer.

## 2015-08-04 NOTE — Telephone Encounter (Signed)
lmtcb X1 for pt to relay this info also.

## 2015-08-07 ENCOUNTER — Other Ambulatory Visit (INDEPENDENT_AMBULATORY_CARE_PROVIDER_SITE_OTHER): Payer: Medicare Other

## 2015-08-07 DIAGNOSIS — J440 Chronic obstructive pulmonary disease with acute lower respiratory infection: Secondary | ICD-10-CM | POA: Diagnosis not present

## 2015-08-07 DIAGNOSIS — M25551 Pain in right hip: Secondary | ICD-10-CM | POA: Diagnosis not present

## 2015-08-07 DIAGNOSIS — J209 Acute bronchitis, unspecified: Secondary | ICD-10-CM

## 2015-08-07 LAB — CBC WITH DIFFERENTIAL/PLATELET
BASOS PCT: 0.3 % (ref 0.0–3.0)
Basophils Absolute: 0 10*3/uL (ref 0.0–0.1)
EOS ABS: 0 10*3/uL (ref 0.0–0.7)
Eosinophils Relative: 0 % (ref 0.0–5.0)
HCT: 42.7 % (ref 36.0–46.0)
Hemoglobin: 14.4 g/dL (ref 12.0–15.0)
Lymphocytes Relative: 20.4 % (ref 12.0–46.0)
Lymphs Abs: 0.9 10*3/uL (ref 0.7–4.0)
MCHC: 33.7 g/dL (ref 30.0–36.0)
MCV: 94.1 fl (ref 78.0–100.0)
Monocytes Absolute: 0.3 10*3/uL (ref 0.1–1.0)
Monocytes Relative: 6.9 % (ref 3.0–12.0)
Neutro Abs: 3.4 10*3/uL (ref 1.4–7.7)
Neutrophils Relative %: 72.4 % (ref 43.0–77.0)
PLATELETS: 205 10*3/uL (ref 150.0–400.0)
RBC: 4.54 Mil/uL (ref 3.87–5.11)
RDW: 13.6 % (ref 11.5–15.5)
WBC: 4.7 10*3/uL (ref 4.0–10.5)

## 2015-08-08 ENCOUNTER — Other Ambulatory Visit: Payer: Self-pay | Admitting: Orthopaedic Surgery

## 2015-08-08 ENCOUNTER — Ambulatory Visit (HOSPITAL_COMMUNITY)
Admission: RE | Admit: 2015-08-08 | Discharge: 2015-08-08 | Disposition: A | Discharge status: Home / Self Care | Payer: Medicare Other | Source: Ambulatory Visit | Attending: Pulmonary Disease | Admitting: Pulmonary Disease

## 2015-08-08 ENCOUNTER — Encounter (HOSPITAL_COMMUNITY)
Admission: RE | Disposition: A | Discharge status: Home / Self Care | Payer: Self-pay | Source: Ambulatory Visit | Attending: Pulmonary Disease

## 2015-08-08 ENCOUNTER — Encounter (HOSPITAL_COMMUNITY): Payer: Self-pay | Admitting: Respiratory Therapy

## 2015-08-08 DIAGNOSIS — M25551 Pain in right hip: Secondary | ICD-10-CM

## 2015-08-08 DIAGNOSIS — J440 Chronic obstructive pulmonary disease with acute lower respiratory infection: Secondary | ICD-10-CM

## 2015-08-08 DIAGNOSIS — R911 Solitary pulmonary nodule: Secondary | ICD-10-CM | POA: Diagnosis not present

## 2015-08-08 DIAGNOSIS — J3801 Paralysis of vocal cords and larynx, unilateral: Secondary | ICD-10-CM | POA: Diagnosis not present

## 2015-08-08 DIAGNOSIS — J42 Unspecified chronic bronchitis: Secondary | ICD-10-CM | POA: Insufficient documentation

## 2015-08-08 DIAGNOSIS — Z79899 Other long term (current) drug therapy: Secondary | ICD-10-CM | POA: Insufficient documentation

## 2015-08-08 DIAGNOSIS — J209 Acute bronchitis, unspecified: Secondary | ICD-10-CM

## 2015-08-08 DIAGNOSIS — Z87891 Personal history of nicotine dependence: Secondary | ICD-10-CM | POA: Diagnosis not present

## 2015-08-08 DIAGNOSIS — R918 Other nonspecific abnormal finding of lung field: Secondary | ICD-10-CM | POA: Diagnosis present

## 2015-08-08 HISTORY — PX: VIDEO BRONCHOSCOPY: SHX5072

## 2015-08-08 SURGERY — VIDEO BRONCHOSCOPY WITHOUT FLUORO
Anesthesia: Moderate Sedation | Laterality: Bilateral

## 2015-08-08 MED ORDER — SODIUM CHLORIDE 0.9 % IV SOLN
INTRAVENOUS | Status: DC
  Administered 2015-08-08: 14:00:00 via INTRAVENOUS

## 2015-08-08 MED ORDER — BUTAMBEN-TETRACAINE-BENZOCAINE 2-2-14 % EX AERO: 1.0000 | INHALATION_SPRAY | Freq: Once | CUTANEOUS | Status: DC

## 2015-08-08 MED ORDER — PHENYLEPHRINE HCL 0.25 % NA SOLN: 1.0000 | Freq: Four times a day (QID) | NASAL | Status: DC | PRN

## 2015-08-08 MED ORDER — FENTANYL CITRATE (PF) 100 MCG/2ML IJ SOLN
INTRAMUSCULAR | Status: AC
  Filled 2015-08-08: qty 4

## 2015-08-08 MED ORDER — FENTANYL CITRATE (PF) 100 MCG/2ML IJ SOLN
INTRAMUSCULAR | Status: DC | PRN
  Administered 2015-08-08: 50 ug via INTRAVENOUS

## 2015-08-08 MED ORDER — PHENYLEPHRINE HCL 0.25 % NA SOLN
NASAL | Status: DC | PRN
  Administered 2015-08-08: 2 via NASAL

## 2015-08-08 MED ORDER — LIDOCAINE HCL 2 % EX GEL
CUTANEOUS | Status: DC | PRN
  Administered 2015-08-08: 1

## 2015-08-08 MED ORDER — LIDOCAINE HCL (PF) 1 % IJ SOLN
INTRAMUSCULAR | Status: DC | PRN
  Administered 2015-08-08: 6 mL

## 2015-08-08 MED ORDER — MIDAZOLAM HCL 10 MG/2ML IJ SOLN
INTRAMUSCULAR | Status: DC | PRN
  Administered 2015-08-08: 2 mg via INTRAVENOUS

## 2015-08-08 MED ORDER — LIDOCAINE HCL 2 % EX GEL: 1.0000 "application " | Freq: Once | CUTANEOUS | Status: DC

## 2015-08-08 MED ORDER — MIDAZOLAM HCL 5 MG/ML IJ SOLN
INTRAMUSCULAR | Status: AC
  Filled 2015-08-08: qty 2

## 2015-08-08 NOTE — Op Note (Signed)
PCCM Video Bronchoscopy Procedure Note  The patient was informed of the risks (including but not limited to bleeding, infection, respiratory failure, lung injury, tooth/oral injury) and benefits of the procedure and gave consent, see chart.  Indication: Pulmonary nodules  Post Procedure Diagnosis: chronic bronchitis, right vocal cord paralysis  Location: [facility]  Condition pre procedure: Stable  Medications for procedure: Versed 2mg  IV, Fentanyl 71mcg IV  Procedure start time:  14:03  Procedure End time:  14:07   Procedure description: The bronchoscope was introduced through the nose and passed to the bilateral lungs to the level of the subsegmental bronchi throughout the tracheobronchial tree.  Airway exam revealed: grossly normal appearing pharynx but the right vocal cord did not adduct completely.  The Trachea was normal in size but had findings consistent with mild, chronic appearing inflammation (mildly erythematous, grey mucus throughout). The patient had significant cough immediately upon entering the trachea.  The carina was sharp.  There were thick grey secretions throughout the tracheobronchial tree which were non-obstructing.  There were no airway lesions.  Procedures performed:  BAL left lower lobe medial segment and superior segment, 90cc total sterile saline injected, 10cc cloudy, non-bloody return  Specimens sent:  BAL: cytology, bacterial, fungal, AFB culture  Condition post procedure: Stable  EBL: none  Complications: none  [name], MD [place] [facility] Pager: [phone] Cell: [phone] After 3pm or if no response, call [phone]

## 2015-08-08 NOTE — Telephone Encounter (Signed)
lmtcb X2 for pt.  

## 2015-08-08 NOTE — H&P (Signed)
  [facility]  HPI: Courtney Ayers is my office patient with advanced COPD who has multiple pulmonary nodules which have ben waxing and waning over the last several months to a year.  Most recently the left lower lobe has a 2-3cm nodule in the superior segment.  Past Medical History  Diagnosis Date  . Allergic rhinitis, cause unspecified     seasonal  . Osteopenia   . OA (osteoarthritis)     B THUMB, LEFT HIP  . Adenomatous colon polyp   . Depressive disorder, not elsewhere classified 2006    some anxiety recurs when she has stopped med in past     Family History  Problem Relation Age of Onset  . Cancer Mother     bladder, metastatic  . Alcohol abuse Father   . Heart disease Father     CHF  . Hyperlipidemia Father   . Hypertension Father   . Aortic aneurysm Sister   . Crohn's disease Sister   . Diabetes Maternal Uncle   . Cancer Maternal Grandmother 98    pancreatic  . Cancer Maternal Grandfather     bladder cancer in 59's  . Stroke Paternal Grandmother   . Heart disease Paternal Grandfather     MI later 29's, early 10's  . Hyperlipidemia Sister   . Cancer Sister     brain cancer in her 25's     Social History   Social History  . Marital Status: Single    Spouse Name: N/A  . Number of Children: N/A  . Years of Education: N/A   Occupational History  . Not on file.   Social History Main Topics  . Smoking status: Former Smoker -- 1.00 packs/day for 45 years    Types: Cigarettes    Quit date: 08/05/2014  . Smokeless tobacco: Never Used  . Alcohol Use: 0.0 oz/week    0 Standard drinks or equivalent per week     Comment: 1-2 drink per evening.  . Drug Use: No  . Sexual Activity:    Partners: Female   Other Topics Concern  . Not on file   Social History Narrative   Lives alone, 1 cat, 1 dog, 2 lovebirds, 4 calico fantail goldfish     No Known Allergies   @encmedstart @  Filed Vitals:   08/08/15 1315 08/08/15 1320 08/08/15 1325 08/08/15 1330  BP: 183/106  176/97 181/96   Pulse: 88 88 84 77  Temp:      TempSrc:      Resp: 19 25 23 18   Height:      Weight:      SpO2: 95% 96% 96% 100%   Gen: well appearing HENT: OP clear, TM's clear, neck supple PULM: diminished bilaterally, normal effort CV: RRR, no mgr, trace edema GI: BS+, soft, nontender Derm: no cyanosis or rash Psyche: normal mood and affect  CT images personally reviewed: multiple nodules throughout both lungs, scar apex RUL, largest nodule (new) superior segment of the left upper lobe  Impression/plan: Waxing and waning pulmonary nodules of uncertain etiology: suspect infectious or inflammatory.  She notes some hip pain so I wonder if these are rheumatoid nodules.  Plan bronchoscopy with BAL and cytology.  Courtney Awkward, MD [place] [facility] Pager: [phone] Cell: [phone] After 3pm or if no response, call [phone]

## 2015-08-08 NOTE — Progress Notes (Signed)
Video bronchoscopy performed.  Intervention bronchial washing.   No complications noted.  Will continue to monitor. 

## 2015-08-08 NOTE — Discharge Instructions (Signed)
Flexible Bronchoscopy, Care After These instructions give you information on caring for yourself after your procedure. Your doctor may also give you more specific instructions. Call your doctor if you have any problems or questions after your procedure. HOME CARE  Do not eat or drink anything for 2 hours after your procedure. If you try to eat or drink before the medicine wears off, food or drink could go into your lungs. You could also burn yourself.  After 2 hours have passed and when you can cough and gag normally, you may eat soft food and drink liquids slowly.  The day after the test, you may eat your normal diet.  You may do your normal activities.  Keep all doctor visits. GET HELP RIGHT AWAY IF:  You get more and more short of breath.  You get light-headed.  You feel like you are going to pass out (faint).  You have chest pain.  You have new problems that worry you.  You cough up more than a little blood.  You cough up more blood than before. MAKE SURE YOU:  Understand these instructions.  Will watch your condition.  Will get help right away if you are not doing well or get worse.   Do not eat or drink anything until 4:00 pm on 08/08/2015   This information is not intended to replace advice given to you by your health care provider. Make sure you discuss any questions you have with your health care provider.   Document Released: 03/24/2009 Document Revised: 06/01/2013 Document Reviewed: 01/29/2013 Elsevier Interactive Patient Education [employer].

## 2015-08-09 ENCOUNTER — Encounter (HOSPITAL_COMMUNITY): Payer: Self-pay | Admitting: Pulmonary Disease

## 2015-08-09 LAB — ACID FAST SMEAR (AFB): ACID FAST SMEAR - AFSCU2: NEGATIVE

## 2015-08-09 NOTE — Telephone Encounter (Signed)
Spoke with the pt  BQ was able to show her ct images  I advised her of ONO results  She verbalized understanding  Order sent to [facility] to d/c o2

## 2015-08-11 LAB — CULTURE, RESPIRATORY: CULTURE: NORMAL

## 2015-08-11 LAB — CULTURE, RESPIRATORY W GRAM STAIN: Special Requests: NORMAL

## 2015-08-15 ENCOUNTER — Telehealth: Payer: Self-pay | Admitting: Pulmonary Disease

## 2015-08-15 MED ORDER — PREDNISONE 10 MG PO TABS: ORAL_TABLET | ORAL | Status: DC

## 2015-08-15 MED ORDER — DOXYCYCLINE HYCLATE 100 MG PO TABS: ORAL_TABLET | ORAL | Status: DC

## 2015-08-15 NOTE — Telephone Encounter (Signed)
Called and spoke with the pt. Reviewed recs and verified pharmacy as [facility] on [address]. Pt voiced understanding and had no further questions. Rx sent. Nothing further needed.

## 2015-08-15 NOTE — Telephone Encounter (Signed)
Pt called back.  S/s present Xseveral days.    Pt uses [facility] [address].    BQ please advise on recs.  Thanks!

## 2015-08-15 NOTE — Telephone Encounter (Signed)
Doxycycline 100mg  po bid x5 days Prednisone taper: Take 40mg  po daily for 3 days, then take 30mg  po daily for 3 days, then take 20mg  po daily for two days, then take 10mg  po daily for 2 days Call if no improvement in 3-4 days

## 2015-08-15 NOTE — Telephone Encounter (Signed)
Spoke with pt, c/o SOB, sinus congestion, pnd, chest soreness from coughing- cough is mostly nonprod with some gray/white mucus, rash on chest  Pt has been taking mucinex, using neti pot.   While speaking to pt, line ended.  atc pt back X4, went straight to VM.  Lmtcb for pt.

## 2015-08-15 NOTE — Telephone Encounter (Signed)
Patient returned call, CB [phone].

## 2015-08-16 ENCOUNTER — Telehealth: Payer: Self-pay | Admitting: Pulmonary Disease

## 2015-08-16 NOTE — Telephone Encounter (Signed)
Called and spoke to pt. Pt states she took her Doxycycline yesterday, took two tabs at one time then soon after had an episode of emesis. Advised pt that she needs to be taking her Doxy 1 tab twice a day and on a full stomach. Pt verbalized understanding and is now questioning if she needs 2 more tablets to complete her 5 day rx of Doxy due to the emesis episode. Advised pt we will check with BQ but needs to continue taking 1 tab twice a day till we get back to her.   Dr. Lake Bells please advise if pt needs two more tabs of Doxy. Thanks.   Juanito Doom, MD at 08/15/2015 10:45 AM     Status: Signed       Expand All Collapse All   Doxycycline 100mg  po bid x5 days Prednisone taper: Take 40mg  po daily for 3 days, then take 30mg  po daily for 3 days, then take 20mg  po daily for two days, then take 10mg  po daily for 2 days Call if no improvement in 3-4 days

## 2015-08-17 ENCOUNTER — Ambulatory Visit
Admission: RE | Admit: 2015-08-17 | Discharge: 2015-08-17 | Disposition: A | Discharge status: Home / Self Care | Payer: Medicare Other | Source: Ambulatory Visit | Attending: Orthopaedic Surgery | Admitting: Orthopaedic Surgery

## 2015-08-17 DIAGNOSIS — M1611 Unilateral primary osteoarthritis, right hip: Secondary | ICD-10-CM | POA: Diagnosis not present

## 2015-08-17 DIAGNOSIS — M25551 Pain in right hip: Secondary | ICD-10-CM

## 2015-08-17 MED ORDER — DOXYCYCLINE HYCLATE 100 MG PO TABS: 100.0000 mg | ORAL_TABLET | Freq: Every day | ORAL | Status: DC

## 2015-08-17 NOTE — Telephone Encounter (Signed)
LM x 1  For pt

## 2015-08-17 NOTE — Telephone Encounter (Signed)
Pt aware that 2 additional tablets are being sent to [facility] of the Doxy 100mg .  Nothing further needed.

## 2015-08-17 NOTE — Telephone Encounter (Signed)
Pt returning call can be reached @ same #.Courtney Ayers

## 2015-08-17 NOTE — Telephone Encounter (Signed)
Yes please, give 2 more tabs to complete 5 day course as I planned

## 2015-08-18 ENCOUNTER — Encounter: Payer: Self-pay | Admitting: *Deleted

## 2015-08-21 ENCOUNTER — Telehealth: Payer: Self-pay | Admitting: Pulmonary Disease

## 2015-08-21 NOTE — Telephone Encounter (Signed)
On 08/09/15 order was placed to d/c O2. Pt reports she does not want this and is requesting to keep her O2 if possible. Also pt wants to know if Dr. Lake Bells would renew her handicap placard for her? Please advise thanks

## 2015-08-22 ENCOUNTER — Ambulatory Visit (INDEPENDENT_AMBULATORY_CARE_PROVIDER_SITE_OTHER)
Admission: RE | Admit: 2015-08-22 | Discharge: 2015-08-22 | Disposition: A | Discharge status: Home / Self Care | Payer: Medicare Other | Source: Ambulatory Visit | Attending: Adult Health | Admitting: Adult Health

## 2015-08-22 ENCOUNTER — Telehealth: Payer: Self-pay | Admitting: Pulmonary Disease

## 2015-08-22 ENCOUNTER — Ambulatory Visit (INDEPENDENT_AMBULATORY_CARE_PROVIDER_SITE_OTHER): Payer: Medicare Other | Admitting: Adult Health

## 2015-08-22 ENCOUNTER — Encounter: Payer: Self-pay | Admitting: Adult Health

## 2015-08-22 VITALS — BP 122/82 | HR 87 | Temp 97.5°F | Ht 67.0 in | Wt 139.0 lb

## 2015-08-22 DIAGNOSIS — R05 Cough: Secondary | ICD-10-CM | POA: Diagnosis not present

## 2015-08-22 DIAGNOSIS — J209 Acute bronchitis, unspecified: Secondary | ICD-10-CM

## 2015-08-22 DIAGNOSIS — J449 Chronic obstructive pulmonary disease, unspecified: Secondary | ICD-10-CM | POA: Diagnosis not present

## 2015-08-22 DIAGNOSIS — R0602 Shortness of breath: Secondary | ICD-10-CM | POA: Diagnosis not present

## 2015-08-22 DIAGNOSIS — J440 Chronic obstructive pulmonary disease with acute lower respiratory infection: Secondary | ICD-10-CM | POA: Diagnosis not present

## 2015-08-22 MED ORDER — AMOXICILLIN-POT CLAVULANATE 875-125 MG PO TABS
1.0000 | ORAL_TABLET | Freq: Two times a day (BID) | ORAL | Status: AC
Start: 2015-08-22 — End: 2015-08-29

## 2015-08-22 NOTE — Telephone Encounter (Addendum)
Patient coughing up grayish/yellow phlegm, thick, hard to get out.  Patient has to cough really hard to get out the phlegm.  Patient has completed the antibiotics, still taking Prednisone (down to 2 daily).     Pharmacy:  [facility] [address]  No Known Allergies   Current Outpatient Prescriptions on File Prior to Visit  Medication Sig Dispense Refill  . Calcium Carbonate-Vitamin D (CALCIUM-VITAMIN D) 500-200 MG-UNIT tablet Take 1 tablet by mouth daily.    . citalopram (CELEXA) 10 MG tablet take 1 tablet by mouth once daily 90 tablet 3  . doxycycline (VIBRA-TABS) 100 MG tablet Take 2 tablets by mouth for 5 days 10 tablet 0  . doxycycline (VIBRA-TABS) 100 MG tablet Take 1 tablet (100 mg total) by mouth daily. 2 tablet 0  . fluticasone (FLONASE) 50 MCG/ACT nasal spray Place 2 sprays into both nostrils daily. 16 g 5  . Fluticasone-Salmeterol (ADVAIR) 250-50 MCG/DOSE AEPB Inhale 1 puff into the lungs every 12 (twelve) hours. 180 each 3  . guaiFENesin (MUCINEX) 600 MG 12 hr tablet Take 600 mg by mouth at bedtime.    [name] ibuprofen (ADVIL,MOTRIN) 200 MG tablet Take 400 mg by mouth at bedtime and may repeat dose one time if needed.     . loratadine (CLARITIN) 10 MG tablet Take 1 tablet (10 mg total) by mouth daily. 30 tablet   . Multiple Vitamin (MULTIVITAMIN WITH MINERALS) TABS tablet Take 1 tablet by mouth daily.    . predniSONE (DELTASONE) 10 MG tablet Take 40mg  po daily for 3 days, then take 30mg  po daily for 3 days, then take 20mg  po daily for two days, then take 10mg  po daily for 2 days 27 tablet 0  . tiotropium (SPIRIVA) 18 MCG inhalation capsule Place 1 capsule (18 mcg total) into inhaler and inhale daily. 2 puffs once a day 30 capsule 5  . UNABLE TO FIND Med Name: Asthalin 166mcg - 2 puff PRN Pt gets through [facility]     No current facility-administered medications on file prior to visit.   To Dr. Lenna Gilford in Dr. Anastasia Pall absence.

## 2015-08-22 NOTE — Telephone Encounter (Signed)
Opened wrong msg in error.

## 2015-08-22 NOTE — Progress Notes (Signed)
Quick Note:  Called and spoke with pt. Reviewed results and recs. Pt voiced understanding and had no further questions. ______ 

## 2015-08-22 NOTE — Telephone Encounter (Signed)
OK to keep oxygen Yes to handicap placard

## 2015-08-22 NOTE — Assessment & Plan Note (Signed)
Flare  Check cxr   Plan  Chest xray today .  Finish prednisone taper over next week.  Begin Augmentin 875mg  Twice daily  For 7 days -take w/ food.  Eat yogurt while taking . Mucinex DM Twice daily  As needed  Cough/congestion  Follow up with Dr. Lake Bells  6- 8 weeks and As needed   Please contact office for sooner follow up if symptoms do not improve or worsen or seek emergency care

## 2015-08-22 NOTE — Progress Notes (Signed)
Subjective:    Patient ID: Courtney Ayers, female    DOB: December 12, 1945, 70 y.o.   MRN: [id number]  HPI 70 yo former smoker with severe COPD and O2 dependent RF   She had a PFT on 09/2014 which showed an FEV1 of 0.81L (31% pred) CT chest in 07/2014 showed significant emphysema and a 1cm nodule PET CT 2015 showed the nodule was not FCG avid November 2016 CT chest: No change in previously seen nodules, in fact most are smaller, but there is a new 1.4 cm sub-solid nodule seen, recommend follow-up 3 months CT chest 08/02/15 stable or decreased nodules   08/22/2015 Acute OV  Pt presents for an acute office visit. Complains of dry cough, chest congestion/tightness, sinus pressure/congestion, increased SOB, wheezing and loss of appetite x 5 days.  Denies any fever, nausea or vomiting. Completed doxycyline and is currently talking pred taper.  Mucus is thick and hard to get up  No hemoptyiss , chest pain, orthopnea, or edema.    CT chest on 08/02/15 showed new lung nodules RLL /LLL . Underwent recent FOB for lung nodule , cytology was neg malignant cells. Cx pending .   Is host parents for chinese boys at [employer] . A lot of stress.  Kids sick at home.   Remains on Advair and Spiriva .   Past Medical History  Diagnosis Date  . Allergic rhinitis, cause unspecified     seasonal  . Osteopenia   . OA (osteoarthritis)     B THUMB, LEFT HIP  . Adenomatous colon polyp   . Depressive disorder, not elsewhere classified 2006    some anxiety recurs when she has stopped med in past   Current Outpatient Prescriptions on File Prior to Visit  Medication Sig Dispense Refill  . Calcium Carbonate-Vitamin D (CALCIUM-VITAMIN D) 500-200 MG-UNIT tablet Take 1 tablet by mouth daily.    . citalopram (CELEXA) 10 MG tablet take 1 tablet by mouth once daily 90 tablet 3  . Fluticasone-Salmeterol (ADVAIR) 250-50 MCG/DOSE AEPB Inhale 1 puff into the lungs every 12 (twelve) hours. 180 each 3  . guaiFENesin  (MUCINEX) 600 MG 12 hr tablet Take 600 mg by mouth at bedtime.    [name] loratadine (CLARITIN) 10 MG tablet Take 1 tablet (10 mg total) by mouth daily. 30 tablet   . Multiple Vitamin (MULTIVITAMIN WITH MINERALS) TABS tablet Take 1 tablet by mouth daily.    . predniSONE (DELTASONE) 10 MG tablet Take 40mg  po daily for 3 days, then take 30mg  po daily for 3 days, then take 20mg  po daily for two days, then take 10mg  po daily for 2 days 27 tablet 0  . tiotropium (SPIRIVA) 18 MCG inhalation capsule Place 1 capsule (18 mcg total) into inhaler and inhale daily. 2 puffs once a day 30 capsule 5  . UNABLE TO FIND Med Name: Asthalin 156mcg - 2 puff PRN Pt gets through [place]    . fluticasone (FLONASE) 50 MCG/ACT nasal spray Place 2 sprays into both nostrils daily. (Patient not taking: Reported on 08/22/2015) 16 g 5  . ibuprofen (ADVIL,MOTRIN) 200 MG tablet Take 400 mg by mouth at bedtime and may repeat dose one time if needed. Reported on 08/22/2015     No current facility-administered medications on file prior to visit.      Review of Systems Constitutional:   No  weight loss, night sweats,  Fevers, chills,  +fatigue, or  lassitude.  HEENT:   No headaches,  Difficulty  swallowing,  Tooth/dental problems, or  Sore throat,                No sneezing, itching, ear ache,  +nasal congestion, post nasal drip,   CV:  No chest pain,  Orthopnea, PND, swelling in lower extremities, anasarca, dizziness, palpitations, syncope.   GI  No heartburn, indigestion, abdominal pain, nausea, vomiting, diarrhea, change in bowel habits, loss of appetite, bloody stools.   Resp:    No chest wall deformity  Skin: no rash or lesions.  GU: no dysuria, change in color of urine, no urgency or frequency.  No flank pain, no hematuria   MS:  No joint pain or swelling.  No decreased range of motion.  No back pain.  Psych:  No change in mood or affect. No depression or anxiety.  No memory loss.         Objective:   Physical  Exam  Filed Vitals:   08/22/15 1501  BP: 122/82  Pulse: 87  Temp: 97.5 F (36.4 C)  TempSrc: Oral  Height: 5\' 7"  (1.702 m)  Weight: 139 lb (63.05 kg)  SpO2: 98%   GEN: A/Ox3; pleasant , NAD, eldelry on O2   HEENT:  [place]/AT,  EACs-clear, TMs-wnl, NOSE-clear, THROAT-clear, no lesions, no postnasal drip or exudate noted.   NECK:  Supple w/ fair ROM; no JVD; normal carotid impulses w/o bruits; no thyromegaly or nodules palpated; no lymphadenopathy.  RESP  Decreased BS in bases w/o, wheezes/ rales/ or rhonchi.no accessory muscle use, no dullness to percussion  CARD:  RRR, no m/r/g  , no peripheral edema, pulses intact, no cyanosis or clubbing.  GI:   Soft & nt; nml bowel sounds; no organomegaly or masses detected.  Musco: Warm bil, no deformities or joint swelling noted.   Neuro: alert, no focal deficits noted.    Skin: Warm, no lesions or rashes  Elio Haden NP-C  [facility]  08/22/2015       Assessment & Plan:

## 2015-08-22 NOTE — Telephone Encounter (Signed)
Form filled to the best of my ability, placed in BQ's look-at folder for signature.  Will hold message until form is signed- will need to know if pt wishes to pick this up in office or wants this mailed to her.

## 2015-08-22 NOTE — Telephone Encounter (Signed)
Spoke with pt. She is aware of the below information. Handicap placard has been placed in BQ's look at. Will route to [place] to have BQ sign this tomorrow while he is in clinic.

## 2015-08-22 NOTE — Telephone Encounter (Signed)
Per SN >> pt needs to be added on to one of the NP's schedules this week.  Spoke with pt. She is aware of SN's recommendation. Pt has been added on to TP's schedule today at 3pm. Nothing further was needed.

## 2015-08-22 NOTE — Patient Instructions (Signed)
Chest xray today .  Finish prednisone taper over next week.  Begin Augmentin 875mg  Twice daily  For 7 days -take w/ food.  Eat yogurt while taking . Mucinex DM Twice daily  As needed  Cough/congestion  Follow up with Dr. Lake Bells  6- 8 weeks and As needed   Please contact office for sooner follow up if symptoms do not improve or worsen or seek emergency care

## 2015-08-23 NOTE — Progress Notes (Signed)
Chart reviewed, I agree with above 

## 2015-08-23 NOTE — Telephone Encounter (Signed)
Pt is returning call to check on the status of her handicap placard.

## 2015-08-23 NOTE — Telephone Encounter (Signed)
Spoke with pt, advised her of below message- that we are waiting on BQ to finish filling out form and return it to me.  Pt states to call her when it is done and she will tell us if it needs to be mailed or picked up in office.  Will continue to hold message.

## 2015-08-24 NOTE — Telephone Encounter (Signed)
Patient's sister, Nerine Sturms, in lobby to pick up Handicap Form

## 2015-08-24 NOTE — Telephone Encounter (Signed)
Patients sister came by the office to pick up handicap placard. Copied and placed in scan folder to put in patient's chart. Nothing further needed.

## 2015-08-29 ENCOUNTER — Telehealth: Payer: Self-pay | Admitting: Pulmonary Disease

## 2015-08-29 NOTE — Telephone Encounter (Signed)
Spoke with Melissa with [facility]  She states that pt refused o2 being picked up  We had sent order to d/c this since her last ONO on RA was normal  Per Melissa, o2 will no longer be covered  I called and explained this to the pt and she verbalized understanding  She states that she will call [facility] and tell them that she can pick up o2  Nothing further needed

## 2015-09-04 DIAGNOSIS — M25551 Pain in right hip: Secondary | ICD-10-CM | POA: Diagnosis not present

## 2015-09-04 DIAGNOSIS — M1611 Unilateral primary osteoarthritis, right hip: Secondary | ICD-10-CM | POA: Diagnosis not present

## 2015-09-12 ENCOUNTER — Telehealth: Payer: Self-pay | Admitting: Pulmonary Disease

## 2015-09-12 MED ORDER — AMOXICILLIN-POT CLAVULANATE 875-125 MG PO TABS
1.0000 | ORAL_TABLET | Freq: Two times a day (BID) | ORAL | Status: DC
Start: 2015-09-12 — End: 2015-10-05

## 2015-09-12 NOTE — Telephone Encounter (Signed)
Per SN >> 1. Augmentin 875 1 po BID #20.                    2. Take Align OTC and yogurt.                    3. Mucinex 600mg  2 po BID with fluids  Pt is aware of SN's recommendations. Rx has been sent in. Nothing further was needed.

## 2015-09-12 NOTE — Telephone Encounter (Signed)
Spoke with pt, states she has a "lingering infection" in lungs- pt c/o sob, prod cough with thick cream/yellow colored mucus, "diaphragm pain" with deep breaths, labored breathing.   Denies fever, sinus congestion, chills. S/s present Xover 1 month, but has worsened in past day per pt.  Pt taking maintenance medications- spiriva, advair, albuterol prn- nothing else being taken to help with present s/s.    [facility] uses [facility] [address].    Sending to DOD as BQ is on night float this week.  SN please advise on recs. Thanks!   No Known Allergies

## 2015-09-25 DIAGNOSIS — H16223 Keratoconjunctivitis sicca, not specified as Sjogren's, bilateral: Secondary | ICD-10-CM | POA: Diagnosis not present

## 2015-09-25 LAB — ACID FAST CULTURE WITH REFLEXED SENSITIVITIES (MYCOBACTERIA): Acid Fast Culture: NEGATIVE

## 2015-09-29 ENCOUNTER — Telehealth: Payer: Self-pay | Admitting: Pulmonary Disease

## 2015-09-29 NOTE — Telephone Encounter (Signed)
Spoke with pt, requesting refill on asthalin inhaler to [facility].  This rx has been sent.  Nothing further needed.

## 2015-10-02 ENCOUNTER — Telehealth: Payer: Self-pay | Admitting: Pulmonary Disease

## 2015-10-02 MED ORDER — FLUTICASONE-SALMETEROL 250-50 MCG/DOSE IN AEPB: 1.0000 | INHALATION_SPRAY | Freq: Two times a day (BID) | RESPIRATORY_TRACT | Status: DC

## 2015-10-02 MED ORDER — TIOTROPIUM BROMIDE MONOHYDRATE 18 MCG IN CAPS: 18.0000 ug | ORAL_CAPSULE | Freq: Every day | RESPIRATORY_TRACT | Status: DC

## 2015-10-02 NOTE — Telephone Encounter (Signed)
Spoke with pt. She needs refills on Advair and Spiriva HH. 90 day supplies were requested. Rxs have been sent in. Nothing further was needed.

## 2015-10-05 ENCOUNTER — Encounter: Payer: Self-pay | Admitting: Pulmonary Disease

## 2015-10-05 ENCOUNTER — Ambulatory Visit (INDEPENDENT_AMBULATORY_CARE_PROVIDER_SITE_OTHER): Payer: Medicare Other | Admitting: Pulmonary Disease

## 2015-10-05 VITALS — BP 116/72 | HR 86 | Temp 98.4°F | Ht 68.0 in | Wt 133.0 lb

## 2015-10-05 DIAGNOSIS — J440 Chronic obstructive pulmonary disease with acute lower respiratory infection: Secondary | ICD-10-CM | POA: Diagnosis not present

## 2015-10-05 DIAGNOSIS — R911 Solitary pulmonary nodule: Secondary | ICD-10-CM

## 2015-10-05 DIAGNOSIS — J301 Allergic rhinitis due to pollen: Secondary | ICD-10-CM | POA: Diagnosis not present

## 2015-10-05 DIAGNOSIS — J209 Acute bronchitis, unspecified: Secondary | ICD-10-CM

## 2015-10-05 MED ORDER — MONTELUKAST SODIUM 10 MG PO TABS: 10.0000 mg | ORAL_TABLET | Freq: Every day | ORAL | Status: DC

## 2015-10-05 NOTE — Assessment & Plan Note (Signed)
This problem has been worse lately.   Plan; Continue cetirizine, Flonase Add sinuglair consider allergy referral

## 2015-10-05 NOTE — Progress Notes (Signed)
Subjective:    Patient ID: Courtney Ayers, female    DOB: 05/26/1946, 70 y.o.   MRN: [id number]  Synopsis: Courtney Ayers was admitted for COPD in 2016 She had a PFT on 09/2014 which showed an FEV1 of 0.81L (31% pred) CT chest in 07/2014 showed significant emphysema and a 1cm nodule PET CT 2015 showed the nodule was not FCG avid November 2016 CT chest: No change in previously seen nodules, in fact most are smaller, but there is a new 1.4 cm sub-solid nodule seen, recommend follow-up 3 months  HPI  Chief Complaint  Patient presents with  . Follow-up    Pt c/o sinus congestion, PND, chest congstion, sore throat, prod cough with yellow/white mucus X2-3 days.     Courtney Ayers is better after taking the augmentin and prednisone prescribed last month. This morning she had sinus congestion and eye irritation.  She typically gets it this time of year. She takes generic zyrtec and flonase.  Her breathing is at baseline.  Minimal shortness of breath as long as she paces her self.  Past Medical History  Diagnosis Date  . Allergic rhinitis, cause unspecified     seasonal  . Osteopenia   . OA (osteoarthritis)     B THUMB, LEFT HIP  . Adenomatous colon polyp   . Depressive disorder, not elsewhere classified 2006    some anxiety recurs when she has stopped med in past     Review of Systems  Constitutional: Negative for fever, chills and fatigue.  HENT: Negative for postnasal drip, rhinorrhea and sinus pressure.   Respiratory: Positive for cough and shortness of breath. Negative for wheezing.   Cardiovascular: Negative for chest pain, palpitations and leg swelling.       Objective:   Physical Exam Filed Vitals:   10/05/15 1426  BP: 116/72  Pulse: 86  Temp: 98.4 F (36.9 C)  TempSrc: Oral  Height: 5\' 8"  (1.727 m)  Weight: 133 lb (60.328 kg)  SpO2: 95%  RA  Gen: well appearing HENT: OP clear, TM's clear, neck supple PULM: minimal air movement CV: RRR, no mgr, trace edema GI: BS+,  soft, nontender Derm: no cyanosis or rash Psyche: normal mood and affect       Assessment & Plan:   Allergic rhinitis This problem has been worse lately.   Plan; Continue cetirizine, Flonase Add sinuglair consider allergy referral  COPD (chronic obstructive pulmonary disease) with acute bronchitis (HCC) She still has severe disease but her symptoms are fairly well controlled by self pacing.  Plan: Continue Advair and Spiriva Stay active, I encouraged more exercise today  Solitary pulmonary nodule We are still awaiting for the results of the bronchoscopy to return. Apparently a mold was isolated but has yet to be speciate it. This could potentially explain the findings on her CT chest. If the mold has been isolated then we will refer to infectious diseases for further evaluation. If however it has not been isolated then we will start a workup for connective tissue diseases.  > 50% of time spent face to face in a 26 minute visit  Updated Medication List Outpatient Encounter Prescriptions as of 10/05/2015  Medication Sig  . Calcium Carbonate-Vitamin D (CALCIUM-VITAMIN D) 500-200 MG-UNIT tablet Take 1 tablet by mouth daily.  . citalopram (CELEXA) 10 MG tablet take 1 tablet by mouth once daily  . fluticasone (FLONASE) 50 MCG/ACT nasal spray Place 2 sprays into both nostrils daily.  . Fluticasone-Salmeterol (ADVAIR) 250-50 MCG/DOSE AEPB Inhale 1  puff into the lungs every 12 (twelve) hours.  [name] guaiFENesin (MUCINEX) 600 MG 12 hr tablet Take 600 mg by mouth at bedtime.  [name] ibuprofen (ADVIL,MOTRIN) 200 MG tablet Take 400 mg by mouth at bedtime and may repeat dose one time if needed. Reported on 08/22/2015  . loratadine (CLARITIN) 10 MG tablet Take 1 tablet (10 mg total) by mouth daily.  . Multiple Vitamin (MULTIVITAMIN WITH MINERALS) TABS tablet Take 1 tablet by mouth daily.  [name] tiotropium (SPIRIVA) 18 MCG inhalation capsule Place 1 capsule (18 mcg total) into inhaler and inhale daily.  [name]  UNABLE TO FIND Med Name: Asthalin 123mcg - 2 puff PRN Pt gets through [facility]  . montelukast (SINGULAIR) 10 MG tablet Take 1 tablet (10 mg total) by mouth at bedtime.  . [DISCONTINUED] amoxicillin-clavulanate (AUGMENTIN) 875-125 MG tablet Take 1 tablet by mouth 2 (two) times daily. (Patient not taking: Reported on 10/05/2015)  . [DISCONTINUED] predniSONE (DELTASONE) 10 MG tablet Take 40mg  po daily for 3 days, then take 30mg  po daily for 3 days, then take 20mg  po daily for two days, then take 10mg  po daily for 2 days (Patient not taking: Reported on 10/05/2015)   No facility-administered encounter medications on file as of 10/05/2015.

## 2015-10-05 NOTE — Assessment & Plan Note (Signed)
We are still awaiting for the results of the bronchoscopy to return. Apparently a mold was isolated but has yet to be speciate it. This could potentially explain the findings on her CT chest. If the mold has been isolated then we will refer to infectious diseases for further evaluation. If however it has not been isolated then we will start a workup for connective tissue diseases.

## 2015-10-05 NOTE — Assessment & Plan Note (Signed)
She still has severe disease but her symptoms are fairly well controlled by self pacing.  Plan: Continue Advair and Spiriva Stay active, I encouraged more exercise today

## 2015-10-05 NOTE — Patient Instructions (Addendum)
Take Singulair in addition to your other allergy medications Take your inhaled medicines as you're doing Exercise regularly and stay active We will call you when we have the results from the bronchoscopy. We will see you back in 3 months or sooner if needed

## 2015-10-06 LAB — FUNGUS CULTURE WITH STAIN

## 2015-10-06 LAB — FUNGUS CULTURE RESULT

## 2015-10-06 LAB — FUNGAL ORGANISM REFLEX

## 2015-10-09 ENCOUNTER — Telehealth: Payer: Self-pay | Admitting: Pulmonary Disease

## 2015-10-09 DIAGNOSIS — J9611 Chronic respiratory failure with hypoxia: Secondary | ICD-10-CM

## 2015-10-09 NOTE — Telephone Encounter (Signed)
Patient aware of lab results. Referral entered for Infectious Disease. Nothing further needed.

## 2015-10-10 ENCOUNTER — Telehealth: Payer: Self-pay | Admitting: Pulmonary Disease

## 2015-10-10 NOTE — Telephone Encounter (Signed)
Spoke with pt, wants to know what type of mold grew in her sputum culture.   I do not see any type of specific mold noted in pt's most recent culture. BQ please advise.  Thanks!

## 2015-10-11 NOTE — Telephone Encounter (Signed)
Called, spoke with pt.  Informed her of below.  She verbalized understanding and voiced no further questions or concerns at this time.

## 2015-10-11 NOTE — Telephone Encounter (Signed)
asper[place]

## 2015-10-20 ENCOUNTER — Encounter: Payer: Self-pay | Admitting: Family Medicine

## 2015-10-23 ENCOUNTER — Encounter: Payer: Self-pay | Admitting: Pulmonary Disease

## 2015-10-25 ENCOUNTER — Encounter: Payer: Self-pay | Admitting: Family Medicine

## 2015-11-20 ENCOUNTER — Encounter: Payer: Self-pay | Admitting: Pulmonary Disease

## 2015-11-20 ENCOUNTER — Ambulatory Visit (INDEPENDENT_AMBULATORY_CARE_PROVIDER_SITE_OTHER): Payer: Medicare Other | Admitting: Pulmonary Disease

## 2015-11-20 VITALS — BP 132/88 | HR 57 | Ht 68.0 in | Wt 137.0 lb

## 2015-11-20 DIAGNOSIS — J440 Chronic obstructive pulmonary disease with acute lower respiratory infection: Secondary | ICD-10-CM

## 2015-11-20 DIAGNOSIS — R911 Solitary pulmonary nodule: Secondary | ICD-10-CM

## 2015-11-20 DIAGNOSIS — J9611 Chronic respiratory failure with hypoxia: Secondary | ICD-10-CM | POA: Diagnosis not present

## 2015-11-20 DIAGNOSIS — J209 Acute bronchitis, unspecified: Secondary | ICD-10-CM

## 2015-11-20 NOTE — Assessment & Plan Note (Signed)
Though she has severe disease, this has been a stable interval for her.  Plan: Continue current regimen

## 2015-11-20 NOTE — Progress Notes (Signed)
Subjective:    Patient ID: Courtney Ayers, female    DOB: June 27, 1945, 70 y.o.   MRN: [id number]  Synopsis: Courtney Ayers was admitted for COPD in 2016 She had a PFT on 09/2014 which showed an FEV1 of 0.81L (31% pred) CT chest in 07/2014 showed significant emphysema and a 1cm nodule PET CT 2015 showed the nodule was not FCG avid November 2016 CT chest: No change in previously seen nodules, in fact most are smaller, but there is a new 1.4 cm sub-solid nodule seen, recommend follow-up 3 months  HPI  Chief Complaint  Patient presents with  . Follow-up    Pt c/o nasal/chest congestion and cough with clear mucus. Pt reports some wheeze this morning that was releived with albuterol HFA. Pt denies CP/tightness. Pt does think some symptoms are allergy related.    Symptoms have been stable since the last visit. 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 She doesn't recall ever having mold in her house before. She has not had a flare up of bronchitis or pneumonia since the last visit.  Past Medical History  Diagnosis Date  . Allergic rhinitis, cause unspecified     seasonal  . Osteopenia   . OA (osteoarthritis)     B THUMB, LEFT HIP  . Adenomatous colon polyp   . Depressive disorder, not elsewhere classified 2006    some anxiety recurs when she has stopped med in past     Review of Systems  Constitutional: Negative for fever, chills and fatigue.  HENT: Negative for postnasal drip, rhinorrhea and sinus pressure.   Respiratory: Positive for cough and shortness of breath. Negative for wheezing.   Cardiovascular: Negative for chest pain, palpitations and leg swelling.       Objective:   Physical Exam Filed Vitals:   11/20/15 0919  BP: 132/88  Pulse: 57  Height: 5\' 8"  (1.727 m)  Weight: 62.143 kg (137 lb)  SpO2: 99%  RA  Gen: well appearing HENT: OP clear, TM's clear,  neck supple PULM: limited air movement but clear CV: RRR, no mgr, trace edema GI: BS+, soft, nontender Derm: no cyanosis or rash Psyche: normal mood and affect  CBC    Component Value Date/Time   WBC 4.7 08/07/2015 0902   RBC 4.54 08/07/2015 0902   HGB 14.4 08/07/2015 0902   HCT 42.7 08/07/2015 0902   PLT 205.0 08/07/2015 0902   MCV 94.1 08/07/2015 0902   MCH 31.9 08/10/2014 0417   MCHC 33.7 08/07/2015 0902   RDW 13.6 08/07/2015 0902   LYMPHSABS 0.9 08/07/2015 0902   MONOABS 0.3 08/07/2015 0902   EOSABS 0.0 08/07/2015 0902   BASOSABS 0.0 08/07/2015 0902    February 2017 [id number] ochraceus     Assessment & Plan:   Solitary pulmonary nodule Courtney Ayers has had serial CT scans in the last few years that have shown waxing and waning pulmonary nodules of which we have never had a great explanation.  I worry that the aspergillus species isolated from her BAL is possibly the cause, though I'm not convinced that it is causing symptoms that need to be treated.  Plan: I would like for her to see ID this week to evaluate the aspergillus species.  The specific question is: should this be treated? Is it related to the mold in her home?  Chronic respiratory failure with hypoxia (HCC) Stable  off of oxygen.   COPD (chronic obstructive pulmonary disease) with acute bronchitis ([place]) Though she has severe disease, this has been a stable interval for her.  Plan: Continue current regimen    Updated Medication List Outpatient Encounter Prescriptions as of 11/20/2015  Medication Sig  . Calcium Carbonate-Vitamin D (CALCIUM-VITAMIN D) 500-200 MG-UNIT tablet Take 1 tablet by mouth daily.  . citalopram (CELEXA) 10 MG tablet take 1 tablet by mouth once daily  . fluticasone (FLONASE) 50 MCG/ACT nasal spray Place 2 sprays into both nostrils daily.  . Fluticasone-Salmeterol (ADVAIR) 250-50 MCG/DOSE AEPB Inhale 1 puff into the lungs every 12 (twelve) hours.  [name] guaiFENesin (MUCINEX) 600 MG 12  hr tablet Take 600 mg by mouth at bedtime.  [name] ibuprofen (ADVIL,MOTRIN) 200 MG tablet Take 400 mg by mouth at bedtime and may repeat dose one time if needed. Reported on 08/22/2015  . loratadine (CLARITIN) 10 MG tablet Take 1 tablet (10 mg total) by mouth daily.  . montelukast (SINGULAIR) 10 MG tablet Take 1 tablet (10 mg total) by mouth at bedtime.  . Multiple Vitamin (MULTIVITAMIN WITH MINERALS) TABS tablet Take 1 tablet by mouth daily.  [name] tiotropium (SPIRIVA) 18 MCG inhalation capsule Place 1 capsule (18 mcg total) into inhaler and inhale daily.  [name] UNABLE TO FIND Med Name: Asthalin 138mcg - 2 puff PRN Pt gets through [facility]   No facility-administered encounter medications on file as of 11/20/2015.

## 2015-11-20 NOTE — Patient Instructions (Signed)
We will see you back in 4 months Stay active Keep taking your medications as you're doing I will look for the note from the infectious disease doctors in contact you if there is anything else to do For now, we will plan on doing a repeat CT chest in February 2018.

## 2015-11-20 NOTE — Assessment & Plan Note (Addendum)
Courtney Ayers has had serial CT scans in the last few years that have shown waxing and waning pulmonary nodules of which we have never had a great explanation.  I worry that the aspergillus species isolated from her BAL is possibly the cause, though I'm not convinced that it is causing symptoms that need to be treated.  Specifically, she doesn't seem to have signs of an invasive infection.  Plan: I would like for her to see ID this week to evaluate the aspergillus species.  The specific question is: should this be treated? Is it related to the mold in her home? Plan to repeat CT chest in 07/2016

## 2015-11-20 NOTE — Assessment & Plan Note (Signed)
Stable off of oxygen.

## 2015-11-21 ENCOUNTER — Encounter: Payer: Self-pay | Admitting: Pulmonary Disease

## 2015-11-23 ENCOUNTER — Ambulatory Visit (INDEPENDENT_AMBULATORY_CARE_PROVIDER_SITE_OTHER): Payer: Medicare Other | Admitting: Internal Medicine

## 2015-11-23 ENCOUNTER — Encounter: Payer: Self-pay | Admitting: Internal Medicine

## 2015-11-23 VITALS — BP 133/80 | HR 81 | Temp 98.7°F | Wt 136.0 lb

## 2015-11-23 DIAGNOSIS — J438 Other emphysema: Secondary | ICD-10-CM

## 2015-11-23 DIAGNOSIS — B449 Aspergillosis, unspecified: Secondary | ICD-10-CM

## 2015-11-26 NOTE — Progress Notes (Signed)
RFV: follow up for pulmonary culture Subjective:    Patient ID: Courtney Ayers, female    DOB: Aug 03, 1945, 70 y.o.   MRN: [id number]  HPI Courtney Ayers is a 70 yo F with COPD but also found to have 1cm pulmonary nodule noted in FEb 2016. PEt scan negative but has had serial chest CT for evaluation. Follow up CT in November showed  No change in previously seen nodules, in fact most are smaller, but there is a new 1.4 cm sub-solid nodule seen, recommend follow-up 3 months. Repeat CT in Feb 2017 now showed 2 new nodules in the right lower lobe and left lower lobe, concerning for MAI infection. She underwent bronchoscopy which grew aspergillus ochraceus and cladosporium. She was referred over ID by Dr. Lake Bells for input to whether this is colonization vs pathogenic.  The patient states that she usually has a dry cough often very difficult to have productive sputum but it occurs usually in the morning. She denies any fever, chills, nightsweats, or worsening dyspnea on exertion.   She does recall having chicken for roughly 3 years but stop having chicken coop in 2014. 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  Most recently, she hosted 2 teenagers from [place] to go to school in [place], and recently cleaned their room that had mold in bathroom and also on walls< 1 month.  No Known Allergies Current Outpatient Prescriptions on File Prior to Visit  Medication Sig Dispense Refill  . Calcium Carbonate-Vitamin D (CALCIUM-VITAMIN D) 500-200 MG-UNIT tablet Take 1 tablet by mouth daily.    . citalopram (CELEXA) 10 MG tablet take 1 tablet by mouth once daily 90 tablet 3  . fluticasone (FLONASE) 50 MCG/ACT nasal spray Place 2 sprays into both nostrils daily. 16 g 5  . Fluticasone-Salmeterol (ADVAIR) 250-50 MCG/DOSE AEPB Inhale 1 puff into the lungs every 12 (twelve) hours. 180 each 3  . guaiFENesin (MUCINEX) 600 MG 12 hr tablet Take 600 mg by mouth at bedtime.    [name] ibuprofen (ADVIL,MOTRIN) 200 MG tablet Take  400 mg by mouth at bedtime and may repeat dose one time if needed. Reported on 08/22/2015    . loratadine (CLARITIN) 10 MG tablet Take 1 tablet (10 mg total) by mouth daily. 30 tablet   . montelukast (SINGULAIR) 10 MG tablet Take 1 tablet (10 mg total) by mouth at bedtime. 30 tablet 11  . Multiple Vitamin (MULTIVITAMIN WITH MINERALS) TABS tablet Take 1 tablet by mouth daily.    [name] tiotropium (SPIRIVA) 18 MCG inhalation capsule Place 1 capsule (18 mcg total) into inhaler and inhale daily. 90 capsule 3  . UNABLE TO FIND Med Name: Asthalin 111mcg - 2 puff PRN Pt gets through [place] [facility]     No current facility-administered medications on file prior to visit.   Active Ambulatory Problems    Diagnosis Date Noted  . Osteopenia 01/30/2011  . Anxiety state 01/30/2011  . Depressive disorder, not elsewhere classified 01/30/2011  . Allergic rhinitis 01/30/2011  . History of colonic polyps 08/29/2011  . Diverticulosis of colon (without mention of hemorrhage) 08/29/2011  . COPD (chronic obstructive pulmonary disease) with acute bronchitis ([place]) 08/05/2014  . Solitary pulmonary nodule 08/26/2014  . Chronic respiratory failure with hypoxia ([place]) 07/20/2015   Resolved Ambulatory Problems    Diagnosis Date Noted  . Tobacco use disorder 01/30/2011  . Acute respiratory failure with hypoxia ([place]) 08/05/2014  . Dyspnea 08/05/2014  . Acute bronchitis 08/05/2014  . COPD exacerbation ([place])   . Chest pain  Past Medical History  Diagnosis Date  . Allergic rhinitis, cause unspecified   . OA (osteoarthritis)   . Adenomatous colon polyp    Social History  Substance Use Topics  . Smoking status: Former Smoker -- 1.00 packs/day for 45 years    Types: Cigarettes    Quit date: 08/05/2014  . Smokeless tobacco: Never Used  . Alcohol Use: 0.0 oz/week    0 Standard drinks or equivalent per week     Comment: 1-2 drink per evening.  family history includes Alcohol abuse in her father; Aortic aneurysm in her  sister; Cancer in her maternal grandfather, mother, and sister; Cancer (age of onset: 71) in her maternal grandmother; Crohn's disease in her sister; Diabetes in her maternal uncle; Heart disease in her father and paternal grandfather; Hyperlipidemia in her father and sister; Hypertension in her father; Stroke in her paternal grandmother.  Review of Systems Review of Systems  Constitutional: Negative for fever, chills, diaphoresis, activity change, appetite change, fatigue and unexpected weight change.  HENT: Negative for congestion, sore throat, rhinorrhea, sneezing, trouble swallowing and sinus pressure.  Eyes: Negative for photophobia and visual disturbance.  Respiratory: + dry cough, chest tightness, shortness of breath, wheezing and stridor.  Cardiovascular: Negative for chest pain, palpitations and leg swelling.  Gastrointestinal: Negative for nausea, vomiting, abdominal pain, diarrhea, constipation, blood in stool, abdominal distention and anal bleeding.  Genitourinary: Negative for dysuria, hematuria, flank pain and difficulty urinating.  Musculoskeletal: Negative for myalgias, back pain, joint swelling, arthralgias and gait problem.  Skin: Negative for color change, pallor, rash and wound.  Neurological: Negative for dizziness, tremors, weakness and light-headedness.  Hematological: Negative for adenopathy. Does not bruise/bleed easily.  Psychiatric/Behavioral: Negative for behavioral problems, confusion, sleep disturbance, dysphoric mood, decreased concentration and agitation.       Objective:   Physical Exam  BP 133/80 mmHg  Pulse 81  Temp(Src) 98.7 F (37.1 C) (Oral)  Wt 136 lb (61.689 kg)  SpO2 97%  LMP 06/10/1992 Physical Exam  Constitutional:  oriented to person, place, and time. appears well-developed and well-nourished. No distress.  HENT: /AT, PERRLA, no scleral icterus Mouth/Throat: Oropharynx is clear and moist. No oropharyngeal exudate.  Cardiovascular: Normal  rate, regular rhythm and normal heart sounds. Exam reveals no gallop and no friction rub.  No murmur heard.  Pulmonary/Chest: Effort normal and breath sounds normal. No respiratory distress.  Occ. respiratory wheezes.  Neck = supple, no nuchal rigidity Abdominal: Soft. Bowel sounds are normal.  exhibits no distension. There is no tenderness.  Lymphadenopathy: no cervical adenopathy. No axillary adenopathy Neurological: alert and oriented to person, place, and time.  Skin: Skin is warm and dry. No rash noted. No erythema.  Psychiatric: a normal mood and affect.  behavior is normal    Assessment & Plan:  70 yo F with pulmonary nodules on recent pulm CT concerning for indolent infection, with BAL cx growing cladosporium and aspergillus species. Cladosporium thought to be pathogenic in immunocompromised host and aspergillus species thought to be more environmental contaminant, though has had some human associated infection reported in the literature. She does not appear to be significantly symptomatic over the last 2-3 months.   - will plan on repeat sputum cultures to see if can re-isolate pathogen - will repeat chest CT in July-august to see whether nodules look any different from Feb study  Will discuss with recs with Dr. Lake Bells  Spent 45 min with patient with greater than 50% in counseling on aspergillus pulmonary infection

## 2015-11-27 ENCOUNTER — Telehealth: Payer: Self-pay

## 2015-11-27 DIAGNOSIS — R911 Solitary pulmonary nodule: Secondary | ICD-10-CM

## 2015-11-27 NOTE — Telephone Encounter (Signed)
-----   Message from Juanito Doom, MD sent at 11/27/2015  9:26 AM EDT ----- A,  I talked to her ID doctor today and they recommended that we repeat a CT chest in July (late July) rather than when I had originally scheduled it.  Please order.  Reason, pulmonary nodules.  Thanks [employer]

## 2015-11-27 NOTE — Telephone Encounter (Signed)
Spoke with pt, aware of recs.  New ct ordered.  nothing further needed.

## 2015-11-28 ENCOUNTER — Other Ambulatory Visit: Payer: Medicare Other

## 2015-11-28 DIAGNOSIS — B449 Aspergillosis, unspecified: Secondary | ICD-10-CM

## 2015-12-01 DIAGNOSIS — B449 Aspergillosis, unspecified: Secondary | ICD-10-CM | POA: Diagnosis not present

## 2015-12-05 ENCOUNTER — Other Ambulatory Visit: Payer: Medicare Other

## 2015-12-05 DIAGNOSIS — B449 Aspergillosis, unspecified: Secondary | ICD-10-CM

## 2015-12-06 ENCOUNTER — Other Ambulatory Visit: Payer: Medicare Other

## 2015-12-18 LAB — FUNGUS CULTURE W SMEAR

## 2015-12-27 LAB — FUNGUS CULTURE W SMEAR

## 2016-01-01 ENCOUNTER — Ambulatory Visit (INDEPENDENT_AMBULATORY_CARE_PROVIDER_SITE_OTHER)
Admission: RE | Admit: 2016-01-01 | Discharge: 2016-01-01 | Disposition: A | Discharge status: Home / Self Care | Payer: Medicare Other | Source: Ambulatory Visit | Attending: Pulmonary Disease | Admitting: Pulmonary Disease

## 2016-01-01 DIAGNOSIS — R911 Solitary pulmonary nodule: Secondary | ICD-10-CM | POA: Diagnosis not present

## 2016-01-02 ENCOUNTER — Encounter: Payer: Self-pay | Admitting: Pulmonary Disease

## 2016-01-03 LAB — FUNGUS CULTURE W SMEAR

## 2016-01-09 ENCOUNTER — Ambulatory Visit: Payer: Medicare Other | Admitting: Pulmonary Disease

## 2016-01-23 ENCOUNTER — Ambulatory Visit (INDEPENDENT_AMBULATORY_CARE_PROVIDER_SITE_OTHER): Payer: Medicare Other | Admitting: Internal Medicine

## 2016-01-23 ENCOUNTER — Encounter: Payer: Self-pay | Admitting: Internal Medicine

## 2016-01-23 DIAGNOSIS — B449 Aspergillosis, unspecified: Secondary | ICD-10-CM | POA: Diagnosis present

## 2016-01-24 NOTE — Progress Notes (Signed)
RFV: follow up visit for  Possible fungal infection  Patient ID: Lenon Oms, female   DOB: 1946-04-04, 70 y.o.   MRN: [id number]  HPI  Courtney Ayers is a 70yo F with COPD, bronchiectasis, who previously has had waxing-waning changes on CT. She did undergo BAL in late February that grew cladosporium and spergillus species on BAL but no tissue biopsy. We saw her in June and recommended to repeat sputum culture to see if would have repeat isolation of organism, since she was not worse than her baseline of having productive cough in the morning, mainly. Penicillium isolated in 3 specimen as well 2 specimen had arthrospore mold isolated but not identified further. In addn, she had repeat chest ct, that showed improvement in prior nodules but then appears of new nodules in LUL.  The patient denies worsening cough, fever, chills, nightsweats. She has productive cough in the am, once it clears she is able to do her normal routine of activity   Outpatient Encounter Prescriptions as of 01/23/2016  Medication Sig  . Calcium Carbonate-Vitamin D (CALCIUM-VITAMIN D) 500-200 MG-UNIT tablet Take 1 tablet by mouth daily.  . citalopram (CELEXA) 10 MG tablet take 1 tablet by mouth once daily  . fluticasone (FLONASE) 50 MCG/ACT nasal spray Place 2 sprays into both nostrils daily.  . Fluticasone-Salmeterol (ADVAIR) 250-50 MCG/DOSE AEPB Inhale 1 puff into the lungs every 12 (twelve) hours.  [name] guaiFENesin (MUCINEX) 600 MG 12 hr tablet Take 600 mg by mouth at bedtime.  [name] ibuprofen (ADVIL,MOTRIN) 200 MG tablet Take 400 mg by mouth at bedtime and may repeat dose one time if needed. Reported on 08/22/2015  . loratadine (CLARITIN) 10 MG tablet Take 1 tablet (10 mg total) by mouth daily.  . montelukast (SINGULAIR) 10 MG tablet Take 1 tablet (10 mg total) by mouth at bedtime.  . Multiple Vitamin (MULTIVITAMIN WITH MINERALS) TABS tablet Take 1 tablet by mouth daily.  [name] tiotropium (SPIRIVA) 18 MCG inhalation capsule Place 1  capsule (18 mcg total) into inhaler and inhale daily.  [name] UNABLE TO FIND Med Name: Asthalin 177mcg - 2 puff PRN Pt gets through [place] [facility]   No facility-administered encounter medications on file as of 01/23/2016.      Patient Active Problem List   Diagnosis Date Noted  . Chronic respiratory failure with hypoxia ([place]) 07/20/2015  . Multiple pulmonary nodules 08/26/2014  . COPD (chronic obstructive pulmonary disease) with acute bronchitis ([place]) 08/05/2014  . History of colonic polyps 08/29/2011  . Diverticulosis of colon (without mention of hemorrhage) 08/29/2011  . Osteopenia 01/30/2011  . Anxiety state 01/30/2011  . Depressive disorder, not elsewhere classified 01/30/2011  . Allergic rhinitis 01/30/2011     Health Maintenance Due  Topic Date Due  . INFLUENZA VACCINE  01/09/2016     Review of Systems Review of Systems  Constitutional: Negative for fever, chills, diaphoresis, activity change, appetite change, fatigue and unexpected weight change.  HENT: Negative for congestion, sore throat, rhinorrhea, sneezing, trouble swallowing and sinus pressure.  Eyes: Negative for photophobia and visual disturbance.  Respiratory: + productive morning cough  Cardiovascular: Negative for chest pain, palpitations and leg swelling.  Gastrointestinal: Negative for nausea, vomiting, abdominal pain, diarrhea, constipation, blood in stool, abdominal distention and anal bleeding.  Genitourinary: Negative for dysuria, hematuria, flank pain and difficulty urinating.  Musculoskeletal: Negative for myalgias, back pain, joint swelling, arthralgias and gait problem.  Skin: Negative for color change, pallor, rash and wound.  Neurological: Negative for dizziness, tremors, weakness and light-headedness.  Hematological: Negative for adenopathy. Does not bruise/bleed easily.  Psychiatric/Behavioral: Negative for behavioral problems, confusion, sleep disturbance, dysphoric mood, decreased concentration and  agitation.    Physical Exam   BP (!) 141/87   Pulse 76   Temp 97.8 F (36.6 C) (Oral)   Wt 136 lb 1.9 oz (61.7 kg)   LMP 06/10/1992   SpO2 98%   BMI 20.70 kg/m  Physical Exam  Constitutional:  oriented to person, place, and time. appears thin. No distress.  HENT: [place]/AT, PERRLA, no scleral icterus Mouth/Throat: Oropharynx is clear and moist. No oropharyngeal exudate.  Cardiovascular: Normal rate, regular rhythm and normal heart sounds. Exam reveals no gallop and no friction rub.  No murmur heard.  Pulmonary/Chest: Effort normal and breath sounds normal. No respiratory distress.  has no wheezes. Decreased air movement in upper lobes Neck = supple, no nuchal rigidity Lymphadenopathy: no cervical adenopathy. No axillary adenopathy Skin: Skin is warm and dry. No rash noted. No erythema.  Psychiatric: a normal mood and affect.  behavior is normal.   CBC Lab Results  Component Value Date   WBC 4.7 08/07/2015   RBC 4.54 08/07/2015   HGB 14.4 08/07/2015   HCT 42.7 08/07/2015   PLT 205.0 08/07/2015   MCV 94.1 08/07/2015   MCH 31.9 08/10/2014   MCHC 33.7 08/07/2015   RDW 13.6 08/07/2015   LYMPHSABS 0.9 08/07/2015   MONOABS 0.3 08/07/2015   EOSABS 0.0 08/07/2015   BASOSABS 0.0 08/07/2015   BMET Lab Results  Component Value Date   NA 139 08/10/2014   K 3.9 08/10/2014   CL 93 (L) 08/10/2014   CO2 38 (H) 08/10/2014   GLUCOSE 128 (H) 08/10/2014   BUN 16 08/10/2014   CREATININE 0.65 08/10/2014   CALCIUM 8.8 08/10/2014   GFRNONAA 89 (L) 08/10/2014   GFRAA >90 08/10/2014   Imaging: chest ct 01/01/16  Lungs/Pleura: Biapical pleural-parenchymal scarring again noted. Moderate changes of centrilobular paraseptal emphysema noted bilaterally with areas of probable scarring in both lungs, some of which have a nodular configuration. The new 1.3 x 2.9 cm irregular nodule seen immediately posterior to the left major fissure on the prior study has decreased in the interval, now  measuring 0.7 x 2.2 cm. A second new nodule was identified on the most recent comparison study in the posterior right lower lobe measuring 1.2 x 0.5 cm and has decreased in the interval measuring 2 x 5 mm on image 111 series 3 today. Interval development of a 1.5 x 1.7 cm nodule in the posterior left upper lobe, abutting the major fissure (see image 53 series 3) A new 7 x 9 mm left lower lobe pulmonary nodule is seen on image 90. New anterior left lower lobe subsegmental atelectasis noted. Well-marginated 9 x 12 mm posterior rope medial right lower lobe pulmonary nodule is stable. Upper Abdomen: Unremarkable. Musculoskeletal: Bone windows reveal no worrisome lytic or sclerotic osseous lesions. IMPRESSION: 1. The 2 new nodular opacities seen on the most recent comparison study have decreased in the interval. However, since the 08/02/2015 exam, the patient has developed a new 1.5 x 1.7 cm posterior left upper lobe nodule, just anterior to the major fissure and a new 7 x 9 mm left lower lobe pulmonary nodule. Multiple other a regular nodular opacities in both lungs appear stable. This is compatible with continued "Waxing and waning" of bilateral pulmonary nodules on a background of moderate emphysema. Imaging features are most suggestive of an infectious/inflammatory etiology.  Assessment and Plan  Possible pulmonary fungal infection = patient appears to be at her baseline pulmonary function. Still has waxing-waning picture of pulmonary nodules, though she has new 1.5 x 1.7cm in posterior LUL. microlab did not further identify "arthrospore" which I wonder could be cladosporium. Penicillium is usually thought to be colonizer, not necessarily pathogenic. I have reached out to [facility] to see if further testing can be done for identification. Will discuss with dr Lake Bells if he thinks any of the larger nodules could be accessed by IR for tissue biopsy and culture.   Since she is  asymptomatic, I would not rush to treat since it is still difficult to tell if she is colonized vs. Having true infection from the fungal/mold isolated from prior cultures. I have instructed the patient to call us if her pulmonary symptoms worsen. For now, we will see back in 3 months. We will need to discuss doing tissue dx/biopsy with patient since this was not addressed yet at our appointment.   Spent 25 min with patient with greater than 50% in coordination of care and counseling of findings

## 2016-01-27 ENCOUNTER — Other Ambulatory Visit: Payer: Self-pay | Admitting: Pulmonary Disease

## 2016-03-21 ENCOUNTER — Ambulatory Visit (INDEPENDENT_AMBULATORY_CARE_PROVIDER_SITE_OTHER): Payer: Medicare Other | Admitting: Pulmonary Disease

## 2016-03-21 ENCOUNTER — Encounter: Payer: Self-pay | Admitting: Pulmonary Disease

## 2016-03-21 VITALS — BP 134/76 | HR 67 | Ht 68.0 in | Wt 138.0 lb

## 2016-03-21 DIAGNOSIS — Z23 Encounter for immunization: Secondary | ICD-10-CM | POA: Diagnosis not present

## 2016-03-21 DIAGNOSIS — J440 Chronic obstructive pulmonary disease with acute lower respiratory infection: Secondary | ICD-10-CM | POA: Diagnosis not present

## 2016-03-21 DIAGNOSIS — J209 Acute bronchitis, unspecified: Secondary | ICD-10-CM

## 2016-03-21 DIAGNOSIS — R918 Other nonspecific abnormal finding of lung field: Secondary | ICD-10-CM | POA: Diagnosis not present

## 2016-03-21 NOTE — Progress Notes (Signed)
Subjective:    Patient ID: Courtney Ayers, female    DOB: [birth year], 70 y.o.   MRN: [id number]  Synopsis: Courtney Ayers was admitted for COPD in 2016 She had a PFT on 09/2014 which showed an FEV1 of 0.81L (31% pred) CT chest in 07/2014 showed significant emphysema and a 1cm nodule PET CT 2015 showed the nodule was not FCG avid November 2016 CT chest: No change in previously seen nodules, in fact most are smaller, but there is a new 1.4 cm sub-solid nodule seen, recommend follow-up 3 months  HPI  Chief Complaint  Patient presents with  . Follow-up    pt states she is doing well, notes baseline chest congestion, prod cough with clear mucus in the mornings only.     Lately her breathing has been OK lately.  She still paces herself when she exerts herself.  She struggled cutting the grass.  She cleans her house regularly incluidng vacuuming.   She coughs up mucus in the morning that is clear.  No fever or weight loss.   Past Medical History:  Diagnosis Date  . Adenomatous colon polyp   . Allergic rhinitis, cause unspecified    seasonal  . Depressive disorder, not elsewhere classified 2006   some anxiety recurs when she has stopped med in past  . OA (osteoarthritis)    B THUMB, LEFT HIP  . Osteopenia      Review of Systems  Constitutional: Negative for chills, fatigue and fever.  HENT: Negative for postnasal drip, rhinorrhea and sinus pressure.   Respiratory: Positive for cough and shortness of breath. Negative for wheezing.   Cardiovascular: Negative for chest pain, palpitations and leg swelling.       Objective:   Physical Exam Vitals:   03/21/16 0915  BP: 134/76  Pulse: 67  SpO2: 97%  Weight: 138 lb (62.6 kg)  Height: 5\' 8"  (1.727 m)  RA  Gen: well appearing HENT: OP clear, TM's clear, neck supple PULM: limited air movement but clear CV: RRR, no mgr, trace edema GI: BS+, soft, nontender Derm: no cyanosis or rash Psyche: normal mood and affect  CBC      Component Value Date/Time   WBC 4.7 08/07/2015 0902   RBC 4.54 08/07/2015 0902   HGB 14.4 08/07/2015 0902   HCT 42.7 08/07/2015 0902   PLT 205.0 08/07/2015 0902   MCV 94.1 08/07/2015 0902   MCH 31.9 08/10/2014 0417   MCHC 33.7 08/07/2015 0902   RDW 13.6 08/07/2015 0902   LYMPHSABS 0.9 08/07/2015 0902   MONOABS 0.3 08/07/2015 0902   EOSABS 0.0 08/07/2015 0902   BASOSABS 0.0 08/07/2015 0902    February 2017 [id number] ochraceus     Assessment & Plan:   COPD (chronic obstructive pulmonary disease) with acute bronchitis ([place]) This is been a stable interval without exacerbations.  Plan: Flu shot today Continue current inhaled therapy  Multiple pulmonary nodules I appreciate infectious disease involvement here. My suspicion is that she has some sort of an indolent mold which is not causing an active infection. Trouble is with her smoking history we can't ignore nodules. I think the best approach at this point is to repeat a CT chest in January which would be a 6 month interval and then plan on doing them annually after that. Her risk of a biopsy given her emphysema and her tendency to easily decompensate is too high to merit a biopsy at this time.   Updated Medication List Outpatient Encounter Prescriptions as  of 03/21/2016  Medication Sig  . Calcium Carbonate-Vitamin D (CALCIUM-VITAMIN D) 500-200 MG-UNIT tablet Take 1 tablet by mouth daily.  . citalopram (CELEXA) 10 MG tablet take 1 tablet by mouth once daily  . fluticasone (FLONASE) 50 MCG/ACT nasal spray Place 2 sprays into both nostrils daily.  . Fluticasone-Salmeterol (ADVAIR) 250-50 MCG/DOSE AEPB Inhale 1 puff into the lungs every 12 (twelve) hours.  [name] guaiFENesin (MUCINEX) 600 MG 12 hr tablet Take 1,200 mg by mouth 2 (two) times daily.   [name] ibuprofen (ADVIL,MOTRIN) 200 MG tablet Take 400 mg by mouth at bedtime and may repeat dose one time if needed. Reported on 08/22/2015  . loratadine (CLARITIN) 10 MG tablet Take 1  tablet (10 mg total) by mouth daily.  . montelukast (SINGULAIR) 10 MG tablet Take 1 tablet (10 mg total) by mouth at bedtime.  . Multiple Vitamin (MULTIVITAMIN WITH MINERALS) TABS tablet Take 1 tablet by mouth daily.  [name] SPIRIVA HANDIHALER 18 MCG inhalation capsule INHALE THE CONTENTS OF 1 CAPSULE ONCE DAILY USING HANDIHALER AS DIRECTED  . UNABLE TO FIND Med Name: Asthalin 160mcg - 2 puff PRN Pt gets through [place]   No facility-administered encounter medications on file as of 03/21/2016.

## 2016-03-21 NOTE — Assessment & Plan Note (Signed)
This is been a stable interval without exacerbations.  Plan: Flu shot today Continue current inhaled therapy

## 2016-03-21 NOTE — Assessment & Plan Note (Signed)
I appreciate infectious disease involvement here. My suspicion is that she has some sort of an indolent mold which is not causing an active infection. Trouble is with her smoking history we can't ignore nodules. I think the best approach at this point is to repeat a CT chest in January which would be a 6 month interval and then plan on doing them annually after that. Her risk of a biopsy given her emphysema and her tendency to easily decompensate is too high to merit a biopsy at this time.

## 2016-03-21 NOTE — Patient Instructions (Signed)
We will arrange for a CT scan of your chest in January 2018 and then see you after that Keep taking your medicines as you're doing

## 2016-03-22 ENCOUNTER — Other Ambulatory Visit: Payer: Self-pay | Admitting: Pulmonary Disease

## 2016-03-22 DIAGNOSIS — F411 Generalized anxiety disorder: Secondary | ICD-10-CM

## 2016-04-03 ENCOUNTER — Telehealth: Payer: Self-pay | Admitting: Pulmonary Disease

## 2016-04-03 NOTE — Telephone Encounter (Signed)
Will route to [place] to follow up on.

## 2016-04-04 NOTE — Telephone Encounter (Signed)
The letter she is calling about is from [place], and basically states that starting in Jan 2018, spiriva will no longer be covered. They are going to require her to try anoro, bevespi, or incruse first. I spoke with the pt and notified her that we will forward msg to BQ to see if he feels one of these alternatives will be appropriate for her. Please advise, thanks! FYI- she is scheduled next on 06/28/15

## 2016-04-04 NOTE — Telephone Encounter (Signed)
Pt called wanting to know what her next steps will be in regards to the paperwork she dropped off on 10/25. Pt also would like to change pharmacy to: [facility].Mearl Latin

## 2016-04-05 NOTE — Telephone Encounter (Signed)
Incruse should be a straightforward switch.  We can do that in January

## 2016-04-05 NOTE — Telephone Encounter (Signed)
Spoke with pt. She is aware of BQ's response. States that she will call back in January to make the switch.

## 2016-04-18 ENCOUNTER — Ambulatory Visit: Payer: Medicare Other | Admitting: Internal Medicine

## 2016-05-01 IMAGING — CR DG CHEST 1V PORT
1 series · 1 of 1 positions shown · non-contrast
Comparison: Portable chest x-ray of 08/08/2014 and CT chest of
08/05/2014

CLINICAL DATA: Chronic obstructive pulmonary disease, followup

EXAM:
PORTABLE CHEST - 1 VIEW

[AP]
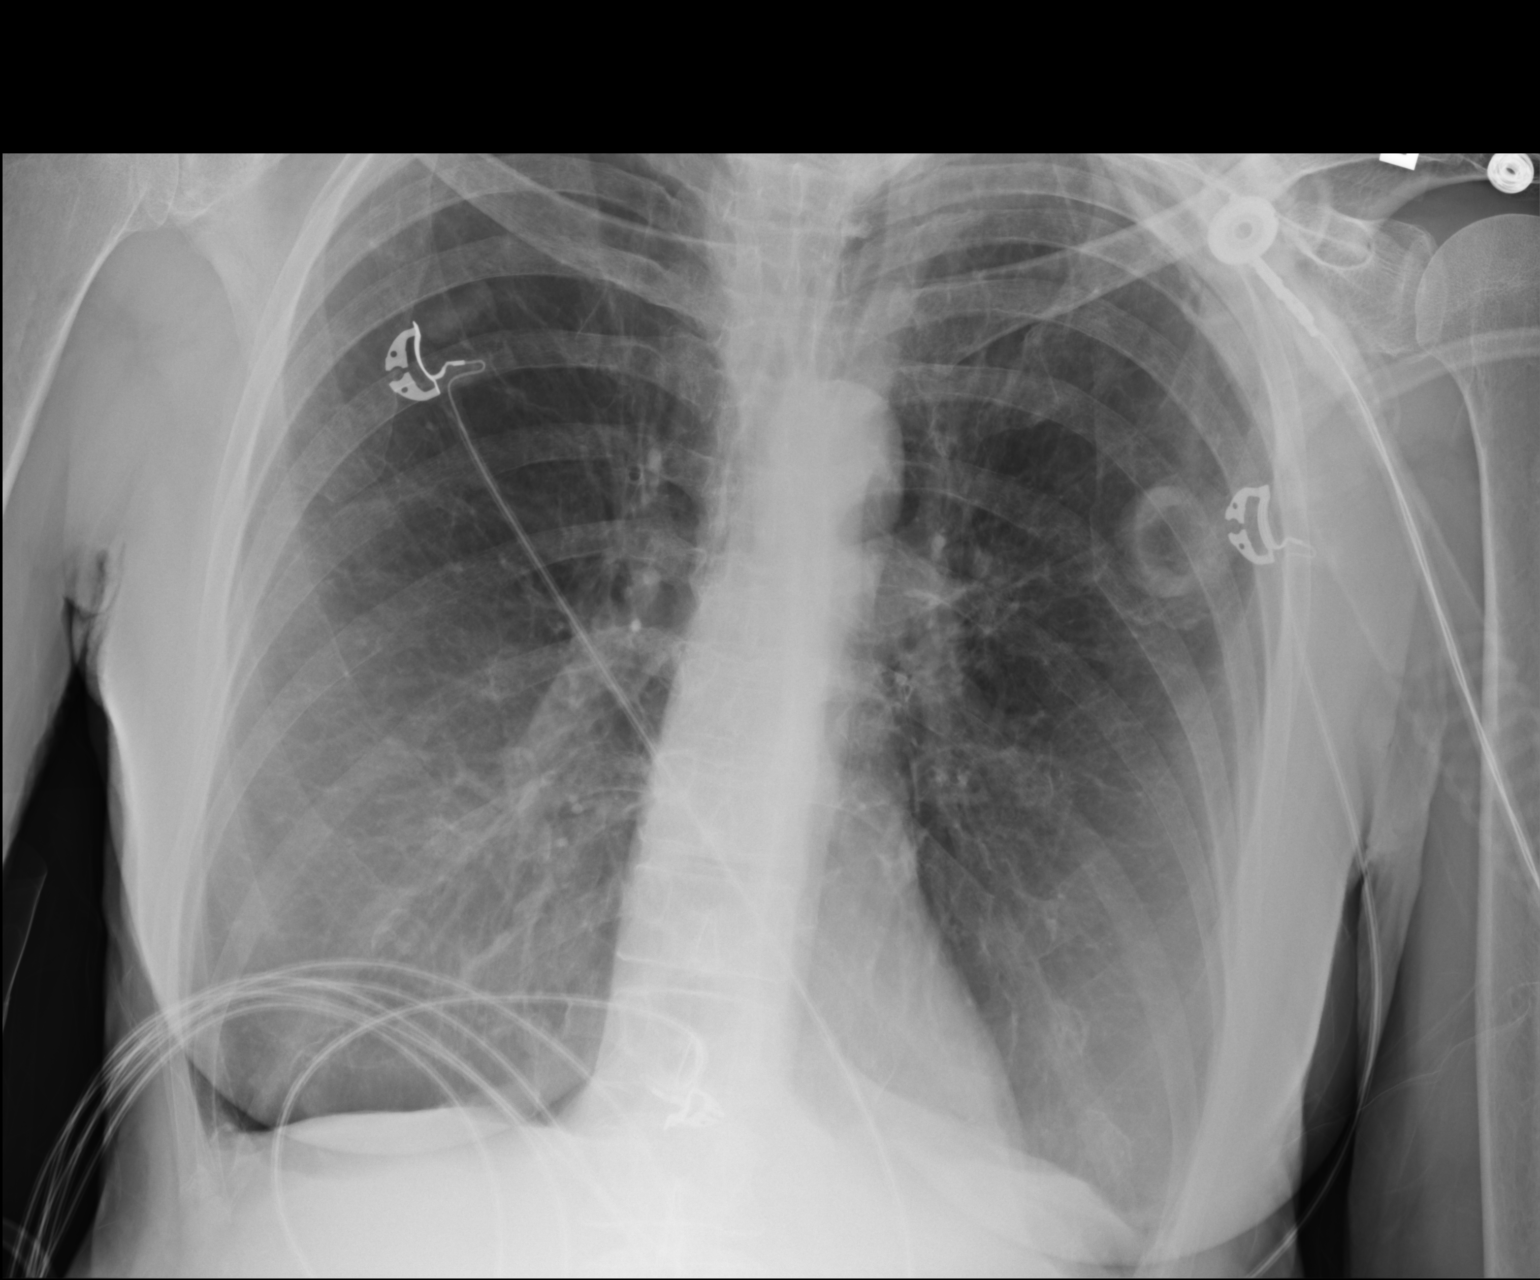

[1 of 1 positions shown; findings below may reference images not displayed]

FINDINGS: As noted on recent CT, there is severe changes of centrilobular
emphysema noted. The nodule described posterior medially in the
right lower lobe on CT scan is not visualized by chest x-ray. No new
infiltrate or plural effusion is seen. Mediastinal and hilar
contours are unchanged. Heart size is stable.
IMPRESSION: 1. Severe emphysema.  No definite infiltrate or effusion.
2. The nodule noted by CT in the right lower lobe posterior medially
is not seen by chest x-ray.

## 2016-05-15 ENCOUNTER — Ambulatory Visit (INDEPENDENT_AMBULATORY_CARE_PROVIDER_SITE_OTHER): Payer: Medicare Other | Admitting: Internal Medicine

## 2016-05-15 ENCOUNTER — Encounter: Payer: Self-pay | Admitting: Internal Medicine

## 2016-05-15 VITALS — BP 160/89 | HR 91 | Temp 97.4°F | Ht 68.0 in | Wt 139.0 lb

## 2016-05-15 DIAGNOSIS — J479 Bronchiectasis, uncomplicated: Secondary | ICD-10-CM

## 2016-05-15 NOTE — Progress Notes (Signed)
Patient ID: Courtney Ayers, female   DOB: August 15, 1945, 70 y.o.   MRN: [id number]  HPI Courtney Ayers is a 70yo F with COPD, bronchiectasis, who previously has had waxing-waning changes on CT. We saw her originally in August  For BAL finidings showing penicillium and cladosporium on BAL but no tissue biopsy. Her last chest ct, that showed improvement in prior nodules but then appears of new nodules in LUL.  The patient denies worsening cough, fever, chills, nightsweats. She has productive cough in the am, once it clears she is able to do her normal routine of activity   Outpatient Encounter Prescriptions as of 05/15/2016  Medication Sig  . Calcium Carbonate-Vitamin D (CALCIUM-VITAMIN D) 500-200 MG-UNIT tablet Take 1 tablet by mouth daily.  . citalopram (CELEXA) 10 MG tablet take 1 tablet by mouth once daily  . fluticasone (FLONASE) 50 MCG/ACT nasal spray Place 2 sprays into both nostrils daily.  . Fluticasone-Salmeterol (ADVAIR) 250-50 MCG/DOSE AEPB Inhale 1 puff into the lungs every 12 (twelve) hours.  [name] guaiFENesin (MUCINEX) 600 MG 12 hr tablet Take 1,200 mg by mouth 2 (two) times daily.   [name] ibuprofen (ADVIL,MOTRIN) 200 MG tablet Take 400 mg by mouth at bedtime and may repeat dose one time if needed. Reported on 08/22/2015  . loratadine (CLARITIN) 10 MG tablet Take 1 tablet (10 mg total) by mouth daily.  . montelukast (SINGULAIR) 10 MG tablet Take 1 tablet (10 mg total) by mouth at bedtime.  . Multiple Vitamin (MULTIVITAMIN WITH MINERALS) TABS tablet Take 1 tablet by mouth daily.  [name] SPIRIVA HANDIHALER 18 MCG inhalation capsule INHALE THE CONTENTS OF 1 CAPSULE ONCE DAILY USING HANDIHALER AS DIRECTED  . UNABLE TO FIND Med Name: Asthalin 190mcg - 2 puff PRN Pt gets through [place]   No facility-administered encounter medications on file as of 05/15/2016.      Patient Active Problem List   Diagnosis Date Noted  . Chronic respiratory failure with hypoxia ([place]) 07/20/2015  . Multiple  pulmonary nodules 08/26/2014  . COPD (chronic obstructive pulmonary disease) with acute bronchitis ([place]) 08/05/2014  . History of colonic polyps 08/29/2011  . Diverticulosis of colon (without mention of hemorrhage) 08/29/2011  . Osteopenia 01/30/2011  . Anxiety state 01/30/2011  . Depressive disorder, not elsewhere classified 01/30/2011  . Allergic rhinitis 01/30/2011     There are no preventive care reminders to display for this patient.   Review of Systems  Physical Exam   BP (!) 160/89   Pulse 91   Temp 97.4 F (36.3 C) (Oral)   Ht 5\' 8"  (1.727 m)   Wt 139 lb (63 kg)   LMP 06/10/1992   BMI 21.13 kg/m  Physical Exam  Constitutional:  oriented to person, place, and time. appears thin framed and wel-nourished. No distress.  HENT: [name]/AT, PERRLA, no scleral icterus Mouth/Throat: Oropharynx is clear and moist. No oropharyngeal exudate.  Cardiovascular: Normal rate, regular rhythm and normal heart sounds. Exam reveals no gallop and no friction rub.  No murmur heard.  Pulmonary/Chest: Effort normal and breath sounds normal. No respiratory distress.  has no wheezes.  Neck = supple, no nuchal rigidity Abdominal: Soft. Bowel sounds are normal.  exhibits no distension. There is no tenderness.  Lymphadenopathy: no cervical adenopathy. No axillary adenopathy Neurological: alert and oriented to person, place, and time.  Skin: Skin is warm and dry. No rash noted. No erythema.  Psychiatric: a normal mood and affect.  behavior is normal.   CBC Lab Results  Component  Value Date   WBC 4.7 08/07/2015   RBC 4.54 08/07/2015   HGB 14.4 08/07/2015   HCT 42.7 08/07/2015   PLT 205.0 08/07/2015   MCV 94.1 08/07/2015   MCH 31.9 08/10/2014   MCHC 33.7 08/07/2015   RDW 13.6 08/07/2015   LYMPHSABS 0.9 08/07/2015   MONOABS 0.3 08/07/2015   EOSABS 0.0 08/07/2015   BASOSABS 0.0 08/07/2015   BMET Lab Results  Component Value Date   NA 139 08/10/2014   K 3.9 08/10/2014   CL 93 (L)  08/10/2014   CO2 38 (H) 08/10/2014   GLUCOSE 128 (H) 08/10/2014   BUN 16 08/10/2014   CREATININE 0.65 08/10/2014   CALCIUM 8.8 08/10/2014   GFRNONAA 89 (L) 08/10/2014   GFRAA >90 08/10/2014     Assessment and Plan  Bronchiectasis = continue with maintenance treatment  Colonization with penicillium and cladosporium - she does not appear to have any worsening pulmonary symptoms. Per dr Anastasia Pall last clinic note, the plan will be to repeat chest CT in the early 2018 to assess changes noted from previously documented nodules to see if need to undergo repeat BAL or biopsy for culture. At this time, would not empirically treat for fungal infection

## 2016-06-04 ENCOUNTER — Emergency Department (HOSPITAL_COMMUNITY): Payer: Medicare Other

## 2016-06-04 ENCOUNTER — Inpatient Hospital Stay (HOSPITAL_COMMUNITY)
Admission: EM | Admit: 2016-06-04 | Discharge: 2016-06-09 | DRG: 190 | Disposition: A | Discharge status: Home / Self Care | Payer: Medicare Other | Attending: Internal Medicine | Admitting: Internal Medicine

## 2016-06-04 ENCOUNTER — Encounter (HOSPITAL_COMMUNITY): Payer: Self-pay | Admitting: Emergency Medicine

## 2016-06-04 DIAGNOSIS — Z86010 Personal history of colonic polyps: Secondary | ICD-10-CM | POA: Diagnosis not present

## 2016-06-04 DIAGNOSIS — F329 Major depressive disorder, single episode, unspecified: Secondary | ICD-10-CM | POA: Diagnosis present

## 2016-06-04 DIAGNOSIS — M19042 Primary osteoarthritis, left hand: Secondary | ICD-10-CM | POA: Diagnosis present

## 2016-06-04 DIAGNOSIS — R0602 Shortness of breath: Secondary | ICD-10-CM

## 2016-06-04 DIAGNOSIS — Z8052 Family history of malignant neoplasm of bladder: Secondary | ICD-10-CM

## 2016-06-04 DIAGNOSIS — R0902 Hypoxemia: Secondary | ICD-10-CM | POA: Diagnosis not present

## 2016-06-04 DIAGNOSIS — E876 Hypokalemia: Secondary | ICD-10-CM | POA: Diagnosis not present

## 2016-06-04 DIAGNOSIS — Z808 Family history of malignant neoplasm of other organs or systems: Secondary | ICD-10-CM

## 2016-06-04 DIAGNOSIS — Z85820 Personal history of malignant melanoma of skin: Secondary | ICD-10-CM | POA: Diagnosis not present

## 2016-06-04 DIAGNOSIS — M1612 Unilateral primary osteoarthritis, left hip: Secondary | ICD-10-CM | POA: Diagnosis present

## 2016-06-04 DIAGNOSIS — Z823 Family history of stroke: Secondary | ICD-10-CM

## 2016-06-04 DIAGNOSIS — J209 Acute bronchitis, unspecified: Secondary | ICD-10-CM | POA: Diagnosis present

## 2016-06-04 DIAGNOSIS — R000 Tachycardia, unspecified: Secondary | ICD-10-CM | POA: Diagnosis present

## 2016-06-04 DIAGNOSIS — R918 Other nonspecific abnormal finding of lung field: Secondary | ICD-10-CM | POA: Diagnosis present

## 2016-06-04 DIAGNOSIS — Z8249 Family history of ischemic heart disease and other diseases of the circulatory system: Secondary | ICD-10-CM

## 2016-06-04 DIAGNOSIS — J9621 Acute and chronic respiratory failure with hypoxia: Secondary | ICD-10-CM | POA: Diagnosis present

## 2016-06-04 DIAGNOSIS — R9431 Abnormal electrocardiogram [ECG] [EKG]: Secondary | ICD-10-CM | POA: Diagnosis present

## 2016-06-04 DIAGNOSIS — Z7951 Long term (current) use of inhaled steroids: Secondary | ICD-10-CM | POA: Diagnosis not present

## 2016-06-04 DIAGNOSIS — Z833 Family history of diabetes mellitus: Secondary | ICD-10-CM

## 2016-06-04 DIAGNOSIS — Z87891 Personal history of nicotine dependence: Secondary | ICD-10-CM

## 2016-06-04 DIAGNOSIS — J189 Pneumonia, unspecified organism: Secondary | ICD-10-CM | POA: Diagnosis present

## 2016-06-04 DIAGNOSIS — M19041 Primary osteoarthritis, right hand: Secondary | ICD-10-CM | POA: Diagnosis present

## 2016-06-04 DIAGNOSIS — J449 Chronic obstructive pulmonary disease, unspecified: Secondary | ICD-10-CM

## 2016-06-04 DIAGNOSIS — J441 Chronic obstructive pulmonary disease with (acute) exacerbation: Secondary | ICD-10-CM | POA: Diagnosis not present

## 2016-06-04 DIAGNOSIS — M8580 Other specified disorders of bone density and structure, unspecified site: Secondary | ICD-10-CM | POA: Diagnosis present

## 2016-06-04 DIAGNOSIS — J181 Lobar pneumonia, unspecified organism: Secondary | ICD-10-CM | POA: Diagnosis not present

## 2016-06-04 DIAGNOSIS — J440 Chronic obstructive pulmonary disease with acute lower respiratory infection: Secondary | ICD-10-CM | POA: Diagnosis not present

## 2016-06-04 DIAGNOSIS — Z811 Family history of alcohol abuse and dependence: Secondary | ICD-10-CM

## 2016-06-04 DIAGNOSIS — R739 Hyperglycemia, unspecified: Secondary | ICD-10-CM | POA: Diagnosis not present

## 2016-06-04 DIAGNOSIS — Z800 Family history of malignant neoplasm of digestive organs: Secondary | ICD-10-CM

## 2016-06-04 HISTORY — DX: Chronic obstructive pulmonary disease, unspecified: J44.9

## 2016-06-04 LAB — CBC WITH DIFFERENTIAL/PLATELET
Basophils Absolute: 0 10*3/uL (ref 0.0–0.1)
Basophils Relative: 0 %
Eosinophils Absolute: 0 10*3/uL (ref 0.0–0.7)
Eosinophils Relative: 0 %
HEMATOCRIT: 42.7 % (ref 36.0–46.0)
HEMOGLOBIN: 14.2 g/dL (ref 12.0–15.0)
LYMPHS ABS: 0.9 10*3/uL (ref 0.7–4.0)
LYMPHS PCT: 7 %
MCH: 32.6 pg (ref 26.0–34.0)
MCHC: 33.3 g/dL (ref 30.0–36.0)
MCV: 98.2 fL (ref 78.0–100.0)
MONOS PCT: 4 %
Monocytes Absolute: 0.6 10*3/uL (ref 0.1–1.0)
NEUTROS ABS: 11.5 10*3/uL — AB (ref 1.7–7.7)
NEUTROS PCT: 89 %
Platelets: 194 10*3/uL (ref 150–400)
RBC: 4.35 MIL/uL (ref 3.87–5.11)
RDW: 13.8 % (ref 11.5–15.5)
WBC: 13 10*3/uL — AB (ref 4.0–10.5)

## 2016-06-04 LAB — BASIC METABOLIC PANEL
Anion gap: 10 (ref 5–15)
BUN: 12 mg/dL (ref 6–20)
CHLORIDE: 104 mmol/L (ref 101–111)
CO2: 24 mmol/L (ref 22–32)
Calcium: 8.8 mg/dL — ABNORMAL LOW (ref 8.9–10.3)
Creatinine, Ser: 0.61 mg/dL (ref 0.44–1.00)
GFR calc Af Amer: >60 mL/min (ref >60)
GFR calc non Af Amer: >60 mL/min (ref >60)
GLUCOSE: 149 mg/dL — AB (ref 65–99)
POTASSIUM: 3.2 mmol/L — AB (ref 3.5–5.1)
SODIUM: 138 mmol/L (ref 135–145)

## 2016-06-04 MED ORDER — SODIUM CHLORIDE 0.9 % IV SOLN
INTRAVENOUS | Status: DC
  Administered 2016-06-04: 21:00:00 via INTRAVENOUS

## 2016-06-04 MED ORDER — INSULIN ASPART 100 UNIT/ML ~~LOC~~ SOLN
0.0000 [IU] | Freq: Three times a day (TID) | SUBCUTANEOUS | Status: DC
  Administered 2016-06-05 – 2016-06-06 (×3): 2 [IU] via SUBCUTANEOUS
  Administered 2016-06-07: 5 [IU] via SUBCUTANEOUS
  Filled 2016-06-04: qty 1

## 2016-06-04 MED ORDER — ALBUTEROL (5 MG/ML) CONTINUOUS INHALATION SOLN
10.0000 mg/h | INHALATION_SOLUTION | Freq: Once | RESPIRATORY_TRACT | Status: AC
  Administered 2016-06-04: 10 mg/h via RESPIRATORY_TRACT
  Filled 2016-06-04: qty 20

## 2016-06-04 MED ORDER — POTASSIUM CHLORIDE CRYS ER 20 MEQ PO TBCR
40.0000 meq | EXTENDED_RELEASE_TABLET | Freq: Once | ORAL | Status: AC
  Administered 2016-06-04: 40 meq via ORAL
  Filled 2016-06-04: qty 2

## 2016-06-04 NOTE — H&P (Signed)
History and Physical    Courtney Ayers [id number] DOB: 1946/03/26 DOA: 06/04/2016  PCP: Wyatt Haste, MD Consultants:  [facility] - pulmonology; Baxter Flattery - ID Patient coming from: home - lives with daughter; NOK: sister, [phone]  Chief Complaint: DOE  HPI: Courtney Ayers is a 70 y.o. female with medical history significant of COPD (not on home O2), OA, and depression presenting with evolving symptoms over the last few days.  She kind of put it aside so that she could continue her holiday activities.  Unable to walk as far as usual, struggling to get air.  Followed usual medication regimens and used inhaler prn - 2-4 times daily. Noticed change in sputum, light yellow.  Yesterday with nausea and diarrhea.  Last night, went to her sister's for dinner and it was hard to get up her walkway.  Got into the house finally.  Slept ok.  This AM was cleaning house.  Noticed she was hungry but unable to eat because she was nauseated.  Usually drinks coffee in the AM and this breaks up mucus in her lungs but today mucus just wouldn't come up.  As the day progressed, nausea continued but no further diarrhea.  Noticed pain on left side under her ribcage (stomach area).  Discomfort in neck and down the middle of her back and in her head.  Symptoms just weren't getting any better so she decided to come in.     ED Course: Per Dr. Zenia Resides: Patient given Solu-Medrol prior to arrival. Given additional dose of albuterol here and patient continues to have wheezing. Patient will be admitted for observation for COPD exacerbation    Review of Systems: As per HPI; otherwise 10 point review of systems reviewed and negative.   Ambulatory Status:  Ambulates without assistance unless hip arthritis is bothering her  Past Medical History:  Diagnosis Date  . Adenomatous colon polyp   . Allergic rhinitis, cause unspecified    seasonal  . COPD (chronic obstructive pulmonary disease) ([place])    no home O2  .  Depressive disorder, not elsewhere classified 2006   some anxiety recurs when she has stopped med in past  . OA (osteoarthritis)    B THUMB, RIGHT > LEFT HIP, BACK  . Osteopenia     Past Surgical History:  Procedure Laterality Date  . BREAST ENHANCEMENT SURGERY  1978   implants placed '78, removed and replaced  approx '95 (Dr. Wendy Poet)  . COLONOSCOPY  08/2011  . COSMETIC SURGERY     LIPOSUCTION; other facial cosmetic surgery  . GANGLION CYST EXCISION    . Honor HERNIA REPAIR  2004   right, and ganglion cyst removal  . LIPOMA EXCISION    . MELANOMA EXCISION  2013   R upper arm  . NASAL SEPTUM SURGERY  1987   for deviated septum (Dr. Wendy Poet)  . TONSILLECTOMY AND ADENOIDECTOMY  1951  . VIDEO BRONCHOSCOPY Bilateral 08/08/2015   Procedure: VIDEO BRONCHOSCOPY WITHOUT FLUORO;  Surgeon: Juanito Doom, MD;  Location: [facility] ENDOSCOPY;  Service: Cardiopulmonary;  Laterality: Bilateral;    Social History   Social History  . Marital status: Single    Spouse name: N/A  . Number of children: N/A  . Years of education: N/A   Occupational History  . Not on file.   Social History Main Topics  . Smoking status: Former Smoker    Packs/day: 1.00    Years: 45.00    Types: Cigarettes    Quit date: 08/05/2014  .  Smokeless tobacco: Never Used  . Alcohol use 0.0 oz/week     Comment: 2 drinks per night  . Drug use: No  . Sexual activity: Not Currently    Partners: Female   Other Topics Concern  . Not on file   Social History Narrative   Lives alone, 1 cat, 1 dog, 2 lovebirds, 4 calico fantail goldfish    No Known Allergies  Family History  Problem Relation Age of Onset  . Cancer Mother     bladder, metastatic  . Alcohol abuse Father   . Heart disease Father     CHF  . Hyperlipidemia Father   . Hypertension Father   . Aortic aneurysm Sister   . Crohn's disease Sister   . Hyperlipidemia Sister   . Cancer Sister     brain cancer in her 18's  . Diabetes Maternal Uncle    . Cancer Maternal Grandmother 98    pancreatic  . Cancer Maternal Grandfather     bladder cancer in 70's  . Stroke Paternal Grandmother   . Heart disease Paternal Grandfather     MI later 16's, early 70's    Prior to Admission medications   Medication Sig Start Date End Date Taking? Authorizing Provider  acetaminophen (TYLENOL) 650 MG CR tablet Take 650 mg by mouth every 8 (eight) hours as needed for pain.   Yes Historical Provider, MD  Calcium Carbonate-Vitamin D (CALCIUM-VITAMIN D) 500-200 MG-UNIT tablet Take 1 tablet by mouth daily.   Yes Historical Provider, MD  citalopram (CELEXA) 10 MG tablet take 1 tablet by mouth once daily 06/19/15  Yes [name], MD  fluticasone [facility]) 50 MCG/ACT nasal spray Place 2 sprays into both nostrils daily. 08/12/14 09/19/16 Yes Donita Brooks, NP  Fluticasone-Salmeterol (ADVAIR) 250-50 MCG/DOSE AEPB Inhale 1 puff into the lungs every 12 (twelve) hours. 10/02/15  Yes Juanito Doom, MD  guaiFENesin (MUCINEX) 600 MG 12 hr tablet Take 1,200 mg by mouth 2 (two) times daily.    Yes Historical Provider, MD  ibuprofen (ADVIL,MOTRIN) 200 MG tablet Take 400 mg by mouth every 8 (eight) hours as needed for mild pain. Reported on 08/22/2015   Yes Historical Provider, MD  loratadine (CLARITIN) 10 MG tablet Take 1 tablet (10 mg total) by mouth daily. 08/12/14  Yes Donita Brooks, NP  montelukast (SINGULAIR) 10 MG tablet Take 1 tablet (10 mg total) by mouth at bedtime. 10/05/15  Yes Juanito Doom, MD  Multiple Vitamin (MULTIVITAMIN WITH MINERALS) TABS tablet Take 1 tablet by mouth daily.   Yes Historical Provider, MD  oxymetazoline (AFRIN) 0.05 % nasal spray Place 1 spray into both nostrils 2 (two) times daily as needed for congestion.   Yes Historical Provider, MD  SPIRIVA HANDIHALER 18 MCG inhalation capsule INHALE THE CONTENTS OF 1 CAPSULE ONCE DAILY USING HANDIHALER AS DIRECTED 01/29/16  Yes Juanito Doom, MD  UNABLE TO FIND Med Name: Asthalin 154mcg - 2 puff  PRN Pt gets through [place]   Yes Historical Provider, MD    Physical Exam: Vitals:   06/04/16 2200 06/04/16 2202 06/04/16 2215 06/04/16 2230  BP: 146/78 146/78  131/80  Pulse: 99 99 99 92  Resp: 14 22 22 21   Temp:      TempSrc:      SpO2: 91% 94% 96% 95%     General:  Appears calm and comfortable and is NAD, very conversant despite receiving nebulizer treatment Eyes:  PERRL, EOMI, normal lids, iris ENT:  grossly  normal hearing, lips & tongue, mmm Neck:  no LAD, masses or thyromegaly Cardiovascular:  RRR, no m/r/g. No LE edema.  Respiratory:  CTA bilaterally, no w/r/r. Normal respiratory effort.  Increased expiratory time. Abdomen:  soft, ntnd, NABS Skin:  no rash or induration seen on limited exam Musculoskeletal:  grossly normal tone BUE/BLE, good ROM, no bony abnormality Psychiatric:  grossly normal mood and affect, speech fluent and appropriate, AOx3 Neurologic:  CN 2-12 grossly intact, moves all extremities in coordinated fashion, sensation intact  Labs on Admission: I have personally reviewed following labs and imaging studies  CBC:  Recent Labs Lab 06/04/16 2026  WBC 13.0*  NEUTROABS 11.5*  HGB 14.2  HCT 42.7  MCV 98.2  PLT [id number]   Basic Metabolic Panel:  Recent Labs Lab 06/04/16 2026  NA 138  K 3.2*  CL 104  CO2 24  GLUCOSE 149*  BUN 12  CREATININE 0.61  CALCIUM 8.8*   GFR: CrCl cannot be calculated (Unknown ideal weight.). Liver Function Tests: No results for input(s): AST, ALT, ALKPHOS, BILITOT, PROT, ALBUMIN in the last 168 hours. No results for input(s): LIPASE, AMYLASE in the last 168 hours. No results for input(s): AMMONIA in the last 168 hours. Coagulation Profile: No results for input(s): INR, PROTIME in the last 168 hours. Cardiac Enzymes: No results for input(s): CKTOTAL, CKMB, CKMBINDEX, TROPONINI in the last 168 hours. BNP (last 3 results) No results for input(s): PROBNP in the last 8760 hours. HbA1C: No results for input(s):  HGBA1C in the last 72 hours. CBG: No results for input(s): GLUCAP in the last 168 hours. Lipid Profile: No results for input(s): CHOL, HDL, LDLCALC, TRIG, CHOLHDL, LDLDIRECT in the last 72 hours. Thyroid Function Tests: No results for input(s): TSH, T4TOTAL, FREET4, T3FREE, THYROIDAB in the last 72 hours. Anemia Panel: No results for input(s): VITAMINB12, FOLATE, FERRITIN, TIBC, IRON, RETICCTPCT in the last 72 hours. Urine analysis:    Component Value Date/Time   BILIRUBINUR neg 09/08/2012 1453   PROTEINUR neg 09/08/2012 1453   UROBILINOGEN negative 09/08/2012 1453   NITRITE neg 09/08/2012 1453   LEUKOCYTESUR Trace 09/08/2012 1453    Creatinine Clearance: CrCl cannot be calculated (Unknown ideal weight.).  Sepsis Labs: @LABRCNTIP (procalcitonin:4,lacticidven:4) )No results found for this or any previous visit (from the past 240 hour(s)).   Radiological Exams on Admission: Dg Chest 2 View  Result Date: 06/04/2016 CLINICAL DATA:  Increasing shortness of breath. EXAM: CHEST  2 VIEW COMPARISON:  08/22/2015. FINDINGS: Severe emphysema. Bilateral nodular densities without noted progression since previous chest x-ray or CT. There is no edema, consolidation, effusion, or pneumothorax. Normal heart size and mediastinal contours. IMPRESSION: 1. Emphysema without acute superimposed finding. 2. Known pulmonary nodules as described on 01/01/2016 chest CT. Electronically Signed   By: Monte Fantasia M.D.   On: 06/04/2016 21:02    EKG: Independently reviewed.  Sinus tachycardia with rate 111; prolonged QT (487); nonspecific ST changes with no evidence of acute ischemia  Assessment/Plan Principal Problem:   COPD (chronic obstructive pulmonary disease) with acute bronchitis ([facility]) Active Problems:   Multiple pulmonary nodules   Prolonged Q-T interval on ECG   Hypokalemia   Hyperglycemia    -Acute on chronic COPD exacerbation:  Patient's shortness of breath and productive cough are most  likely caused by acute COPD exacerbation.  -She has severe COPD seen on CXR and may require home O2 at some time in the relatively near future.  -She did have penicillum on fungal culture recently and this has  not been treated; if her symptoms do not improve with standard treatment, would suggest pulm and/or ID consult to ascertain if treatment is now indicated. -will admit patient to med surg -Supplemental O2 as needed. -Nebulizers: scheduled Duoneb and prn albuterol -Solu-Medrol 60 mg IV BID  -Oral doxycycline  for 7-10 days.  -Mucinex for cough  -Patient also has a h/o pulmonary nodules followed by Dr. Malva Limes.  Since the plan was for repeat CT scan in January, will order this now. -Consider pulm consult if patient is not improving.  Prolonged QT on EKG -Mild -May be related to mild hyperkalemia/recent diarrhea -Will recheck EKG in AM -Attempt to avoid QT prolonging medications if possible  Hypokalemia -Mild (3.2) -Likely related to the diarrhea/excessive GI losses yesterday - she may have had a viral syndrome, but this seems to have improved today -Will replete and follow  Hyperglycemia -Glucose 149 -Likely to be worse with steroids -Will add SSI and A1c   DVT prophylaxis: Lovenox  Code Status: DNR BUT would agree to be intubated for respiratory arrest (here for COPD) - confirmed with patient/family Family Communication: Daughter present throughout hospitalization Disposition Plan: Home once clinically improved Consults called: None, consider pulmonology  Admission status: Admit - It is my clinical opinion that admission to INPATIENT is reasonable and necessary because this patient will require at least 2 midnights in the hospital to treat this condition based on the medical complexity of the problems presented.  Given the aforementioned information, the predictability of an adverse outcome is felt to be significant.    Karmen Bongo MD [facility]  If 7PM-7AM,  please contact night-coverage [email or web address] Password [facility]  06/04/2016, 11:29 PM

## 2016-06-04 NOTE — ED Provider Notes (Signed)
[facility] DEPT Provider Note   CSN: [id number] Arrival date & time: 06/04/16  1950     History   Chief Complaint Chief Complaint  Patient presents with  . Shortness of Breath    HPI Courtney Ayers is a 70 y.o. female.  70 year old female with history of COPD presents with worsening dyspnea times several days. Has had no productive cough without fever or chills. No vomiting noted. No anginal symptoms. No CHF symptoms. Has been using her inhaler without relief. Also complains of left-sided rib pain that began after she had a bad coughing episode is characterized as sharp and worse with any movement. Denies any palpitations or syncope or near-syncope. EMS called and patient's room air sat was 80% on room air. Patient does not use oxygen. She was given albuterol 10 mg along with Atrovent as well as Solu-Medrol. Patient wheezing feels better after the breathing treatment.      Past Medical History:  Diagnosis Date  . Adenomatous colon polyp   . Allergic rhinitis, cause unspecified    seasonal  . Depressive disorder, not elsewhere classified 2006   some anxiety recurs when she has stopped med in past  . OA (osteoarthritis)    B THUMB, LEFT HIP  . Osteopenia     Patient Active Problem List   Diagnosis Date Noted  . Chronic respiratory failure with hypoxia ([place]) 07/20/2015  . Multiple pulmonary nodules 08/26/2014  . COPD (chronic obstructive pulmonary disease) with acute bronchitis ([place]) 08/05/2014  . History of colonic polyps 08/29/2011  . Diverticulosis of colon (without mention of hemorrhage) 08/29/2011  . Osteopenia 01/30/2011  . Anxiety state 01/30/2011  . Depressive disorder, not elsewhere classified 01/30/2011  . Allergic rhinitis 01/30/2011    Past Surgical History:  Procedure Laterality Date  . BREAST ENHANCEMENT SURGERY  1978   implants placed '78, removed and replaced  approx '95 (Dr. Wendy Poet)  . COLONOSCOPY  08/2011  . COSMETIC SURGERY     LIPOSUCTION; other facial cosmetic surgery  . GANGLION CYST EXCISION    . [place] HERNIA REPAIR  2004   right, and ganglion cyst removal  . LIPOMA EXCISION    . MELANOMA EXCISION  2013   R upper arm  . NASAL SEPTUM SURGERY  1987   for deviated septum (Dr. Wendy Poet)  . TONSILLECTOMY AND ADENOIDECTOMY  1951  . VIDEO BRONCHOSCOPY Bilateral 08/08/2015   Procedure: VIDEO BRONCHOSCOPY WITHOUT FLUORO;  Surgeon: Juanito Doom, MD;  Location: [facility] ENDOSCOPY;  Service: Cardiopulmonary;  Laterality: Bilateral;    OB History    Gravida Para Term Preterm AB Living   0 0 0 0 0 0   SAB TAB Ectopic Multiple Live Births   0 0 0 0         Home Medications    Prior to Admission medications   Medication Sig Start Date End Date Taking? Authorizing Provider  Calcium Carbonate-Vitamin D (CALCIUM-VITAMIN D) 500-200 MG-UNIT tablet Take 1 tablet by mouth daily.    Historical Provider, MD  citalopram (CELEXA) 10 MG tablet take 1 tablet by mouth once daily 06/19/15   [name], MD  fluticasone [facility]) 50 MCG/ACT nasal spray Place 2 sprays into both nostrils daily. 08/12/14 09/19/16  Donita Brooks, NP  Fluticasone-Salmeterol (ADVAIR) 250-50 MCG/DOSE AEPB Inhale 1 puff into the lungs every 12 (twelve) hours. 10/02/15   Juanito Doom, MD  guaiFENesin (MUCINEX) 600 MG 12 hr tablet Take 1,200 mg by mouth 2 (two) times daily.  Historical Provider, MD  ibuprofen (ADVIL,MOTRIN) 200 MG tablet Take 400 mg by mouth at bedtime and may repeat dose one time if needed. Reported on 08/22/2015    Historical Provider, MD  loratadine (CLARITIN) 10 MG tablet Take 1 tablet (10 mg total) by mouth daily. 08/12/14   Donita Brooks, NP  montelukast (SINGULAIR) 10 MG tablet Take 1 tablet (10 mg total) by mouth at bedtime. 10/05/15   Juanito Doom, MD  Multiple Vitamin (MULTIVITAMIN WITH MINERALS) TABS tablet Take 1 tablet by mouth daily.    Historical Provider, MD  SPIRIVA HANDIHALER 18 MCG inhalation capsule INHALE THE  CONTENTS OF 1 CAPSULE ONCE DAILY USING HANDIHALER AS DIRECTED 01/29/16   Juanito Doom, MD  UNABLE TO FIND Med Name: Asthalin 116mcg - 2 puff PRN Pt gets through [place]    Historical Provider, MD    Family History Family History  Problem Relation Age of Onset  . Cancer Mother     bladder, metastatic  . Alcohol abuse Father   . Heart disease Father     CHF  . Hyperlipidemia Father   . Hypertension Father   . Aortic aneurysm Sister   . Crohn's disease Sister   . Hyperlipidemia Sister   . Cancer Sister     brain cancer in her 51's  . Diabetes Maternal Uncle   . Cancer Maternal Grandmother 98    pancreatic  . Cancer Maternal Grandfather     bladder cancer in 68's  . Stroke Paternal Grandmother   . Heart disease Paternal Grandfather     MI later 68's, early 61's    Social History Social History  Substance Use Topics  . Smoking status: Former Smoker    Packs/day: 1.00    Years: 45.00    Types: Cigarettes    Quit date: 08/05/2014  . Smokeless tobacco: Never Used  . Alcohol use 0.0 oz/week     Comment: 1-2 drink per evening.     Allergies   Patient has no known allergies.   Review of Systems Review of Systems  All other systems reviewed and are negative.    Physical Exam Updated Vital Signs BP 157/75 (BP Location: Left Arm)   Pulse 110   Temp 99.2 F (37.3 C) (Oral)   Resp 20   LMP 06/10/1992   SpO2 99%   Physical Exam  Constitutional: She is oriented to person, place, and time. She appears well-developed and well-nourished.  Non-toxic appearance. No distress.  HENT:  Head: Normocephalic and atraumatic.  Eyes: Conjunctivae, EOM and lids are normal. Pupils are equal, round, and reactive to light.  Neck: Normal range of motion. Neck supple. No tracheal deviation present. No thyroid mass present.  Cardiovascular: Regular rhythm and normal heart sounds.  Tachycardia present.  Exam reveals no gallop.   No murmur heard. Pulmonary/Chest: No stridor.  Tachypnea noted. She is in respiratory distress. She has no decreased breath sounds. She has wheezes in the right upper field and the left upper field. She has no rhonchi. She has no rales.  Abdominal: Soft. Normal appearance and bowel sounds are normal. She exhibits no distension. There is no tenderness. There is no rebound and no CVA tenderness.  Musculoskeletal: Normal range of motion. She exhibits no edema or tenderness.  Neurological: She is alert and oriented to person, place, and time. She has normal strength. No cranial nerve deficit or sensory deficit. GCS eye subscore is 4. GCS verbal subscore is 5. GCS motor subscore is 6.  Skin: Skin is warm and dry. No abrasion and no rash noted.  Psychiatric: She has a normal mood and affect. Her speech is normal and behavior is normal.  Nursing note and vitals reviewed.    ED Treatments / Results  Labs (all labs ordered are listed, but only abnormal results are displayed) Labs Reviewed  CBC WITH DIFFERENTIAL/PLATELET  BASIC METABOLIC PANEL    EKG  EKG Interpretation  Date/Time:  Tuesday June 04 2016 20:04:56 EST Ventricular Rate:  111 PR Interval:    QRS Duration: 93 QT Interval:  358 QTC Calculation: 487 R Axis:   74 Text Interpretation:  Sinus tachycardia Biatrial enlargement Nonspecific T abnormalities, anterior leads Borderline prolonged QT interval Confirmed by Zenia Resides  MD, Josie Burleigh ([id number]) on 06/04/2016 8:23:54 PM       Radiology No results found.  Procedures Procedures (including critical care time)  Medications Ordered in ED Medications  0.9 %  sodium chloride infusion (not administered)     Initial Impression / Assessment and Plan / ED Course  I have reviewed the triage vital signs and the nursing notes.  Pertinent labs & imaging results that were available during my care of the patient were reviewed by me and considered in my medical decision making (see chart for details).  Clinical Course     Patient  given Solu-Medrol prior to arrival. Given additional dose of albuterol here and patient continues to have wheezing. Patient will be admitted for observation for COPD exacerbation  Final Clinical Impressions(s) / ED Diagnoses   Final diagnoses:  SOB (shortness of breath)    New Prescriptions New Prescriptions   No medications on file     Lacretia Leigh, MD 06/04/16 2224

## 2016-06-04 NOTE — ED Triage Notes (Signed)
Pt brought to ED via EMS from home.  Hx asthma and COPD.  Reports trouble breathing all day.  80% on RA, fire dept placed her on 10 albuterol tx, 98% O2.  Dropped sats when taken off for EMS transfer, placed on 0.5 atrovent albuterol duoneb tx, inc to 98% sats.  Given 125 solumedrol in 20 G LFA.  EKG NSR. Reports right chest pain under ribs with no radiation or numbness/tingling.  Reports gen abd pain.  Diminished lower lobes bilat and wheezes upper lobes bilat. VS: 162/84, 112, 98% on neb

## 2016-06-05 ENCOUNTER — Encounter (HOSPITAL_COMMUNITY): Payer: Self-pay | Admitting: Radiology

## 2016-06-05 ENCOUNTER — Inpatient Hospital Stay (HOSPITAL_COMMUNITY): Payer: Medicare Other

## 2016-06-05 DIAGNOSIS — R9431 Abnormal electrocardiogram [ECG] [EKG]: Secondary | ICD-10-CM

## 2016-06-05 DIAGNOSIS — R739 Hyperglycemia, unspecified: Secondary | ICD-10-CM

## 2016-06-05 LAB — URINALYSIS, ROUTINE W REFLEX MICROSCOPIC
BILIRUBIN URINE: NEGATIVE
Glucose, UA: NEGATIVE mg/dL
KETONES UR: 5 mg/dL — AB
Nitrite: NEGATIVE
Protein, ur: NEGATIVE mg/dL
Specific Gravity, Urine: 1.018 (ref 1.005–1.030)
pH: 5 (ref 5.0–8.0)

## 2016-06-05 LAB — TROPONIN I

## 2016-06-05 LAB — BASIC METABOLIC PANEL
Anion gap: 9 (ref 5–15)
BUN: 12 mg/dL (ref 6–20)
CALCIUM: 8.6 mg/dL — AB (ref 8.9–10.3)
CHLORIDE: 106 mmol/L (ref 101–111)
CO2: 25 mmol/L (ref 22–32)
Creatinine, Ser: 0.64 mg/dL (ref 0.44–1.00)
GFR calc non Af Amer: >60 mL/min (ref >60)
Glucose, Bld: 147 mg/dL — ABNORMAL HIGH (ref 65–99)
Potassium: 3.6 mmol/L (ref 3.5–5.1)
SODIUM: 140 mmol/L (ref 135–145)

## 2016-06-05 LAB — CBG MONITORING, ED: Glucose-Capillary: 144 mg/dL — ABNORMAL HIGH (ref 65–99)

## 2016-06-05 LAB — CBC
HCT: 36.5 % (ref 36.0–46.0)
Hemoglobin: 12.4 g/dL (ref 12.0–15.0)
MCH: 33.1 pg (ref 26.0–34.0)
MCHC: 34 g/dL (ref 30.0–36.0)
MCV: 97.3 fL (ref 78.0–100.0)
PLATELETS: 172 10*3/uL (ref 150–400)
RBC: 3.75 MIL/uL — AB (ref 3.87–5.11)
RDW: 14 % (ref 11.5–15.5)
WBC: 14.6 10*3/uL — AB (ref 4.0–10.5)

## 2016-06-05 LAB — GLUCOSE, CAPILLARY
GLUCOSE-CAPILLARY: 149 mg/dL — AB (ref 65–99)
GLUCOSE-CAPILLARY: 122 mg/dL — AB (ref 65–99)
Glucose-Capillary: 161 mg/dL — ABNORMAL HIGH (ref 65–99)

## 2016-06-05 MED ORDER — OXYMETAZOLINE HCL 0.05 % NA SOLN: 1.0000 | Freq: Two times a day (BID) | NASAL | Status: DC | PRN

## 2016-06-05 MED ORDER — CITALOPRAM HYDROBROMIDE 20 MG PO TABS
10.0000 mg | ORAL_TABLET | Freq: Every day | ORAL | Status: DC
  Administered 2016-06-05 – 2016-06-09 (×5): 10 mg via ORAL
  Filled 2016-06-05 (×5): qty 1

## 2016-06-05 MED ORDER — ACETAMINOPHEN 650 MG RE SUPP: 650.0000 mg | Freq: Four times a day (QID) | RECTAL | Status: DC | PRN

## 2016-06-05 MED ORDER — MORPHINE SULFATE (PF) 2 MG/ML IV SOLN: 2.0000 mg | INTRAVENOUS | Status: DC | PRN

## 2016-06-05 MED ORDER — DOXYCYCLINE HYCLATE 100 MG PO TABS
100.0000 mg | ORAL_TABLET | Freq: Two times a day (BID) | ORAL | Status: DC
  Administered 2016-06-05 – 2016-06-06 (×3): 100 mg via ORAL
  Filled 2016-06-05 (×2): qty 1

## 2016-06-05 MED ORDER — GUAIFENESIN ER 600 MG PO TB12
1200.0000 mg | ORAL_TABLET | Freq: Two times a day (BID) | ORAL | Status: DC
  Administered 2016-06-05 – 2016-06-09 (×9): 1200 mg via ORAL
  Filled 2016-06-05 (×8): qty 2

## 2016-06-05 MED ORDER — IPRATROPIUM-ALBUTEROL 0.5-2.5 (3) MG/3ML IN SOLN
3.0000 mL | Freq: Four times a day (QID) | RESPIRATORY_TRACT | Status: DC
  Administered 2016-06-05 – 2016-06-09 (×17): 3 mL via RESPIRATORY_TRACT
  Filled 2016-06-05 (×18): qty 3

## 2016-06-05 MED ORDER — ONDANSETRON HCL 4 MG PO TABS: 4.0000 mg | ORAL_TABLET | Freq: Four times a day (QID) | ORAL | Status: DC | PRN

## 2016-06-05 MED ORDER — LORATADINE 10 MG PO TABS
10.0000 mg | ORAL_TABLET | Freq: Every day | ORAL | Status: DC
  Administered 2016-06-05 – 2016-06-09 (×5): 10 mg via ORAL
  Filled 2016-06-05 (×4): qty 1

## 2016-06-05 MED ORDER — ONDANSETRON HCL 4 MG/2ML IJ SOLN: 4.0000 mg | Freq: Four times a day (QID) | INTRAMUSCULAR | Status: DC | PRN

## 2016-06-05 MED ORDER — ALBUTEROL SULFATE (2.5 MG/3ML) 0.083% IN NEBU: 2.5000 mg | INHALATION_SOLUTION | RESPIRATORY_TRACT | Status: DC | PRN

## 2016-06-05 MED ORDER — ENOXAPARIN SODIUM 40 MG/0.4ML ~~LOC~~ SOLN
40.0000 mg | SUBCUTANEOUS | Status: DC
Start: 2016-06-05 — End: 2016-06-09
  Administered 2016-06-05 – 2016-06-08 (×4): 40 mg via SUBCUTANEOUS
  Filled 2016-06-05 (×4): qty 0.4

## 2016-06-05 MED ORDER — IOPAMIDOL (ISOVUE-300) INJECTION 61%
INTRAVENOUS | Status: AC
  Administered 2016-06-05: 75 mL
  Filled 2016-06-05: qty 75

## 2016-06-05 MED ORDER — METHYLPREDNISOLONE SODIUM SUCC 125 MG IJ SOLR
60.0000 mg | Freq: Two times a day (BID) | INTRAMUSCULAR | Status: DC
  Administered 2016-06-05 – 2016-06-06 (×3): 60 mg via INTRAVENOUS
  Filled 2016-06-05 (×2): qty 2

## 2016-06-05 MED ORDER — MONTELUKAST SODIUM 10 MG PO TABS
10.0000 mg | ORAL_TABLET | Freq: Every day | ORAL | Status: DC
  Administered 2016-06-05 – 2016-06-08 (×4): 10 mg via ORAL
  Filled 2016-06-05 (×4): qty 1

## 2016-06-05 MED ORDER — FLUTICASONE PROPIONATE 50 MCG/ACT NA SUSP
2.0000 | Freq: Every day | NASAL | Status: DC
  Administered 2016-06-06 – 2016-06-09 (×4): 2 via NASAL
  Filled 2016-06-05: qty 16

## 2016-06-05 MED ORDER — DEXTROSE 5 % IV SOLN
1.0000 g | INTRAVENOUS | Status: DC
  Filled 2016-06-05 (×2): qty 10

## 2016-06-05 MED ORDER — ACETAMINOPHEN 325 MG PO TABS
650.0000 mg | ORAL_TABLET | Freq: Four times a day (QID) | ORAL | Status: DC | PRN
  Administered 2016-06-06: 650 mg via ORAL
  Filled 2016-06-05: qty 2

## 2016-06-05 NOTE — Progress Notes (Signed)
CM consult for COPD protocol. Pt not appropriate for COPD protocol as she has had no other admissions in the system for the past year. Marney Doctor RN,BSN,NCM [phone]

## 2016-06-05 NOTE — Progress Notes (Signed)
PROGRESS NOTE    Courtney Ayers  [id number] DOB: [birth year] DOA: 06/04/2016 PCP: Courtney Haste, MD    Brief Narrative:  Courtney Ayers is a 70 y.o. female with medical history significant of COPD (not on home O2), OA, and depression presenting with evolving symptoms over the last few days.  She kind of put it aside so that she could continue her holiday activities.  Unable to walk as far as usual, struggling to get air.  Followed usual medication regimens and used inhaler prn - 2-4 times daily. Noticed change in sputum, light yellow.  Yesterday with nausea and diarrhea.  Last night, went to her sister's for dinner and it was hard to get up her walkway.  Got into the house finally.  Slept ok.  This AM was cleaning house.  Noticed she was hungry but unable to eat because she was nauseated.  Usually drinks coffee in the AM and this breaks up mucus in her lungs but today mucus just wouldn't come up.  As the day progressed, nausea continued but no further diarrhea.  Noticed pain on left side under her ribcage (stomach area).  Discomfort in neck and down the middle of her back and in her head.  Symptoms just weren't getting any better so she decided to come in.     ED Course: Per Dr. Zenia Resides: Patient given Solu-Medrol prior to arrival. Given additional dose of albuterol here and patient continues to have wheezing. Patient will be admitted for observation for COPD exacerbation    Assessment & Plan:   Principal Problem:   COPD (chronic obstructive pulmonary disease) with acute bronchitis (HCC) Active Problems:   Multiple pulmonary nodules   Prolonged Q-T interval on ECG   Hypokalemia   Hyperglycemia   Acute on chronic COPD exacerbation:  - Patient's shortness of breath and productive cough are most likely caused by acute COPD exacerbation.  -She has severe COPD seen on CXR and may require home O2 at some time in the relatively near future.  -She did have penicillum on fungal  culture recently and this has not been treated; if her symptoms do not improve with standard treatment, would suggest pulm and/or ID consult to ascertain if treatment is now indicated -Supplemental O2 as needed. -Nebulizers: scheduled Duoneb and prn albuterol -Solu-Medrol 60 mg IV BID  -Oral doxycycline  for 7-10 days.  - will add ceftriaxone 1g to cover for pneumonia as seen on CT scan -Mucinex for cough  - patient would like to see how she responds to antibiotics prior to being seen by pulmonology  Prolonged QT on EKG -Will recheck EKG in AM -Attempt to avoid QT prolonging medications if possible  Hypokalemia -repeat this am is 3.6  Hyperglycemia -Glucose 149 -Likely to be worse with steroids -Will add SSI and A1c   DVT prophylaxis: Lovenox  Code Status: DNR BUT would agree to be intubated for respiratory arrest (here for COPD) - confirmed with patient/family Family Communication: Daughter present throughout hospitalization Disposition Plan: Home once clinically improved   Consultants:   none  Procedures:   none  Antimicrobials:   Doxycycline 100mg  PO BID    Subjective: Patient feeling better at time of exam.  Asked many questions about CT scan done earlier today.  States she would like to possibly see how she responds to antibiotics prior to discussing with pulmonology.  She states she already feels an improvement from when she came in.  Did not sleep much due to being in the  ED for part of the night.  Hopeful to get a good nights rest tonight.  Objective: Vitals:   06/05/16 1000 06/05/16 1039 06/05/16 1601 06/05/16 1700  BP: 112/65 113/60  130/75  Pulse: 67 71  74  Resp: 16 20    Temp:  98.3 F (36.8 C)  97.8 F (36.6 C)  TempSrc:  Oral  Oral  SpO2: 99% 96% 99% 100%  Weight:  64.7 kg (142 lb 10.2 oz)    Height:  5\' 8"  (1.727 m)      Intake/Output Summary (Last 24 hours) at 06/05/16 1728 Last data filed at 06/05/16 1145  Gross per 24 hour  Intake               120 ml  Output                0 ml  Net              120 ml   Filed Weights   06/05/16 1039  Weight: 64.7 kg (142 lb 10.2 oz)    Examination:  General exam: Appears calm and comfortable  Respiratory system: Crackles at left lung base, normal respiratory effort, nasal cannula in place Cardiovascular system: S1 & S2 heard, RRR. No JVD, murmurs, rubs, gallops or clicks. No pedal edema. Gastrointestinal system: Abdomen is nondistended, soft and nontender. No organomegaly or masses felt. Normal bowel sounds heard. Central nervous system: Alert and oriented. No focal neurological deficits. Extremities: Symmetric 5 x 5 power. Skin: No rashes, lesions or ulcers Psychiatry: Judgement and insight appear normal. Mood & affect appropriate.     Data Reviewed: I have personally reviewed following labs and imaging studies  CBC:  Recent Labs Lab 06/04/16 2026 06/05/16 0737  WBC 13.0* 14.6*  NEUTROABS 11.5*  --   HGB 14.2 12.4  HCT 42.7 36.5  MCV 98.2 97.3  PLT 194 [id number]   Basic Metabolic Panel:  Recent Labs Lab 06/04/16 2026 06/05/16 0737  NA 138 140  K 3.2* 3.6  CL 104 106  CO2 24 25  GLUCOSE 149* 147*  BUN 12 12  CREATININE 0.61 0.64  CALCIUM 8.8* 8.6*   GFR: Estimated Creatinine Clearance: 65.1 mL/min (by C-G formula based on SCr of 0.64 mg/dL). Liver Function Tests: No results for input(s): AST, ALT, ALKPHOS, BILITOT, PROT, ALBUMIN in the last 168 hours. No results for input(s): LIPASE, AMYLASE in the last 168 hours. No results for input(s): AMMONIA in the last 168 hours. Coagulation Profile: No results for input(s): INR, PROTIME in the last 168 hours. Cardiac Enzymes:  Recent Labs Lab 06/05/16 0737  TROPONINI <0.03   BNP (last 3 results) No results for input(s): PROBNP in the last 8760 hours. HbA1C: No results for input(s): HGBA1C in the last 72 hours. CBG:  Recent Labs Lab 06/05/16 0750 06/05/16 1424 06/05/16 1716  GLUCAP 144* 149* 122*    Lipid Profile: No results for input(s): CHOL, HDL, LDLCALC, TRIG, CHOLHDL, LDLDIRECT in the last 72 hours. Thyroid Function Tests: No results for input(s): TSH, T4TOTAL, FREET4, T3FREE, THYROIDAB in the last 72 hours. Anemia Panel: No results for input(s): VITAMINB12, FOLATE, FERRITIN, TIBC, IRON, RETICCTPCT in the last 72 hours. Sepsis Labs: No results for input(s): PROCALCITON, LATICACIDVEN in the last 168 hours.  No results found for this or any previous visit (from the past 240 hour(s)).       Radiology Studies: Dg Chest 2 View  Result Date: 06/04/2016 CLINICAL DATA:  Increasing shortness of breath. EXAM: CHEST  2 VIEW COMPARISON:  08/22/2015. FINDINGS: Severe emphysema. Bilateral nodular densities without noted progression since previous chest x-ray or CT. There is no edema, consolidation, effusion, or pneumothorax. Normal heart size and mediastinal contours. IMPRESSION: 1. Emphysema without acute superimposed finding. 2. Known pulmonary nodules as described on 01/01/2016 chest CT. Electronically Signed   By: Monte Fantasia M.D.   On: 06/04/2016 21:02   Ct Chest W Contrast  Result Date: 06/05/2016 CLINICAL DATA:  70 year old female with shortness of breath for the past 8 days. Emphysema. EXAM: CT CHEST WITH CONTRAST TECHNIQUE: Multidetector CT imaging of the chest was performed during intravenous contrast administration. CONTRAST:  26mL ISOVUE-300 IOPAMIDOL (ISOVUE-300) INJECTION 61% COMPARISON:  Chest CT 01/01/2016. FINDINGS: Cardiovascular: Heart size is normal. There is no significant pericardial fluid, thickening or pericardial calcification. Aortic atherosclerosis, without evidence of aneurysm or dissection. No definite coronary artery calcifications. Mediastinum/Nodes: No pathologically enlarged mediastinal or hilar lymph nodes. Esophagus is unremarkable in appearance. With no axillary lymphadenopathy. Lungs/Pleura: Diffuse bronchial wall thickening with severe centrilobular  and moderate paraseptal emphysema. Compared to the prior examination there are again numerous nodular areas of architectural distortion and consolidation, which are strongly favored to be infectious or inflammatory in etiology. Several of these areas have progressed compared to the prior study, best demonstrated by a significantly smaller nodule in the posterior aspect of the left upper lobe (image 41 of series 5) which currently measures 10 x 8 mm (previously 15 x 17 mm on 01/01/2016). Other nodular areas are new compared to the prior examination, but also favored to be areas of consolidation, most notable for an ovoid shaped nodular area in the medial aspect of the left upper lobe (image 40 of series 5) measuring 2.8 x 1.0 cm. More well-defined solid-appearing nodule in the posterior aspect of the right lower lobe (Image 133 of series 5) is stable over numerous prior examinations measuring 9 x 12 mm, favored to be a benign lesion such as a hamartoma. There is also some ill-defined consolidation in the basal segments of the left lower lobe at the extreme left lung base which suggests a pneumonia or sequela of aspiration. Trace left pleural effusion lying dependently. Upper Abdomen: Aortic atherosclerosis. Musculoskeletal: Bilateral breast implants incidentally noted. There are no aggressive appearing lytic or blastic lesions noted in the visualized portions of the skeleton. IMPRESSION: 1. Airspace consolidation in the basal segments of the left lower lobe, concerning for either pneumonia or sequela of recent aspiration. There is a trace dependent left pleural effusion associated with this. 2. Multifocal for aggressive appearing nodules again noted throughout the lungs bilaterally. Several of these have regressed compared to the prior studies, while others are new. These remain most compatible with infectious/inflammatory nodules rather than neoplasm. 3. Stable benign appearing 12 x 9 mm nodule in the right lower  lobe, likely a small hamartoma. 4. Diffuse bronchial wall thickening with severe centrilobular and moderate paraseptal emphysema; imaging findings suggestive of underlying COPD. 5. Aortic atherosclerosis. Electronically Signed   By: Vinnie Langton M.D.   On: 06/05/2016 09:22        Scheduled Meds: . citalopram  10 mg Oral Daily  . doxycycline  100 mg Oral Q12H  . enoxaparin (LOVENOX) injection  40 mg Subcutaneous Q24H  . fluticasone  2 spray Each Nare Daily  . guaiFENesin  1,200 mg Oral BID  . insulin aspart  0-15 Units Subcutaneous TID WC  . ipratropium-albuterol  3 mL Nebulization Q6H  . loratadine  10 mg Oral Daily  .  methylPREDNISolone (SOLU-MEDROL) injection  60 mg Intravenous Q12H  . montelukast  10 mg Oral QHS   Continuous Infusions:   LOS: 1 day    Time spent: 30 minutes    Loretha Stapler, MD [facility] Pager [phone]  If 7PM-7AM, please contact night-coverage [email or web address] Password [facility] 06/05/2016, 5:28 PM

## 2016-06-05 NOTE — ED Notes (Signed)
I have just given report to [place], RN on [facility]. [facility] will transport shortly.

## 2016-06-05 NOTE — ED Notes (Signed)
I notify [facility] of need to give report; their nurse will call me back as soon as she is able.

## 2016-06-05 NOTE — ED Notes (Signed)
Sister carol [phone].

## 2016-06-05 NOTE — ED Notes (Signed)
Patient transported to CT 

## 2016-06-05 NOTE — Progress Notes (Signed)
Nutrition Brief Note  RD consulted via COPD gold protocol.  Pt reports improved appetite today and eating well. Ate yogurt, grits, eggs and bacon this morning for breakfast.  Weight is stable. UBW is 135 lb.  Wt Readings from Last 15 Encounters:  06/05/16 142 lb 10.2 oz (64.7 kg)  05/15/16 139 lb (63 kg)  03/21/16 138 lb (62.6 kg)  01/23/16 136 lb 1.9 oz (61.7 kg)  11/23/15 136 lb (61.7 kg)  11/20/15 137 lb (62.1 kg)  10/05/15 133 lb (60.3 kg)  08/22/15 139 lb (63 kg)  08/08/15 135 lb (61.2 kg)  07/20/15 142 lb 9.6 oz (64.7 kg)  06/13/15 145 lb (65.8 kg)  05/24/15 144 lb 12.8 oz (65.7 kg)  10/10/14 145 lb 6.4 oz (66 kg)  10/07/14 147 lb (66.7 kg)  08/26/14 138 lb (62.6 kg)    Body mass index is 21.69 kg/m. Patient meets criteria for normal based on current BMI.   Current diet order is regular, patient is consuming approximately 100% of meals at this time. Labs and medications reviewed.   No nutrition interventions warranted at this time. If nutrition issues arise, please consult RD.   Clayton Bibles, MS, RD, LDN Pager: [phone] After Hours Pager: [phone]

## 2016-06-06 DIAGNOSIS — R918 Other nonspecific abnormal finding of lung field: Secondary | ICD-10-CM

## 2016-06-06 DIAGNOSIS — J440 Chronic obstructive pulmonary disease with acute lower respiratory infection: Secondary | ICD-10-CM

## 2016-06-06 DIAGNOSIS — J209 Acute bronchitis, unspecified: Secondary | ICD-10-CM

## 2016-06-06 DIAGNOSIS — E876 Hypokalemia: Secondary | ICD-10-CM

## 2016-06-06 LAB — MAGNESIUM: Magnesium: 2.2 mg/dL (ref 1.7–2.4)

## 2016-06-06 LAB — BASIC METABOLIC PANEL
Anion gap: 7 (ref 5–15)
BUN: 19 mg/dL (ref 6–20)
CO2: 30 mmol/L (ref 22–32)
Calcium: 8.5 mg/dL — ABNORMAL LOW (ref 8.9–10.3)
Chloride: 101 mmol/L (ref 101–111)
Creatinine, Ser: 0.62 mg/dL (ref 0.44–1.00)
GFR calc Af Amer: >60 mL/min (ref >60)
GLUCOSE: 115 mg/dL — AB (ref 65–99)
POTASSIUM: 3.8 mmol/L (ref 3.5–5.1)
Sodium: 138 mmol/L (ref 135–145)

## 2016-06-06 LAB — HEMOGLOBIN A1C
HEMOGLOBIN A1C: 5.2 % (ref 4.8–5.6)
MEAN PLASMA GLUCOSE: 103 mg/dL

## 2016-06-06 LAB — GLUCOSE, CAPILLARY
GLUCOSE-CAPILLARY: 100 mg/dL — AB (ref 65–99)
Glucose-Capillary: 96 mg/dL (ref 65–99)
Glucose-Capillary: 120 mg/dL — ABNORMAL HIGH (ref 65–99)
Glucose-Capillary: 149 mg/dL — ABNORMAL HIGH (ref 65–99)

## 2016-06-06 LAB — EXPECTORATED SPUTUM ASSESSMENT W GRAM STAIN, RFLX TO RESP C: Special Requests: NORMAL

## 2016-06-06 MED ORDER — LEVOFLOXACIN IN D5W 750 MG/150ML IV SOLN
750.0000 mg | INTRAVENOUS | Status: DC
  Administered 2016-06-06 – 2016-06-07 (×2): 750 mg via INTRAVENOUS
  Filled 2016-06-06 (×2): qty 150

## 2016-06-06 MED ORDER — MOMETASONE FURO-FORMOTEROL FUM 100-5 MCG/ACT IN AERO
2.0000 | INHALATION_SPRAY | Freq: Two times a day (BID) | RESPIRATORY_TRACT | Status: DC
  Administered 2016-06-06 – 2016-06-09 (×6): 2 via RESPIRATORY_TRACT
  Filled 2016-06-06: qty 8.8

## 2016-06-06 MED ORDER — METHYLPREDNISOLONE SODIUM SUCC 40 MG IJ SOLR
40.0000 mg | Freq: Every day | INTRAMUSCULAR | Status: DC
  Administered 2016-06-07 – 2016-06-08 (×2): 40 mg via INTRAVENOUS
  Filled 2016-06-06 (×2): qty 1

## 2016-06-06 NOTE — Progress Notes (Signed)
PROGRESS NOTE    Courtney Ayers  [id number] DOB: 03-11-1946 DOA: 06/04/2016 PCP: Wyatt Haste, MD    Brief Narrative:  Courtney Ayers is a 70 y.o. female with medical history significant of COPD (not on home O2), OA, and depression presenting with evolving symptoms over the last few days.  She kind of put it aside so that she could continue her holiday activities.  Unable to walk as far as usual, struggling to get air.  Followed usual medication regimens and used inhaler prn - 2-4 times daily. Noticed change in sputum, light yellow.  Yesterday with nausea and diarrhea.  Last night, went to her sister's for dinner and it was hard to get up her walkway.  Got into the house finally.  Slept ok.  This AM was cleaning house.  Noticed she was hungry but unable to eat because she was nauseated.  Usually drinks coffee in the AM and this breaks up mucus in her lungs but today mucus just wouldn't come up.  As the day progressed, nausea continued but no further diarrhea.  Noticed pain on left side under her ribcage (stomach area).  Discomfort in neck and down the middle of her back and in her head.  Symptoms just weren't getting any better so she decided to come in.     ED Course: Per Dr. Zenia Resides: Patient given Solu-Medrol prior to arrival. Given additional dose of albuterol here and patient continues to have wheezing. Patient will be admitted for COPD exacerbation    Assessment & Plan:   Principal Problem:   COPD (chronic obstructive pulmonary disease) with acute bronchitis (HCC) Active Problems:   Multiple pulmonary nodules   Prolonged Q-T interval on ECG   Hypokalemia   Hyperglycemia   Acute on chronic COPD exacerbation:  - Patient's shortness of breath and productive cough are most likely caused by acute COPD exacerbation.  -She has severe COPD seen on CXR and may require home O2 at some time in the relatively near future.  -She did have penicillum on fungal culture recently and  this has not been treated - Pulmonology consulted -Supplemental O2 as needed. -Nebulizers: scheduled Duoneb and prn albuterol - steroids decreased per pulmonology - d/c doxycycline given risk of Pseudomonas  -Mucinex for cough  - sputum culture pending - will order flutter valve  Prolonged QT on EKG -Attempt to avoid QT prolonging medications if possible - repeat EKG ordered for tomorrow am  Hypokalemia -repeat this am is 3.6  Hyperglycemia -Glucose 120 this am - seems well controlled even with steroids - HgA1c of 5.2 - Continue SSI   DVT prophylaxis: Lovenox  Code Status: DNR BUT would agree to be intubated for respiratory arrest (here for COPD) - confirmed with patient/family Family Communication: no family bedside today Disposition Plan: Home once clinically improved   Consultants:   Pulmonology  Procedures:   none  Antimicrobials:   Doxycycline 12/26>12/28  Ceftriaxone 12/27  Levaquin 12/28>   Subjective: Patient voices she feels worse this morning.  Says her dyspnea has not improved and she is feeling markedly short of breath ambulating just to and from the bathroom.  Still having significant cough and sputum production.  Still requiring supplemental O2.   Objective: Vitals:   06/05/16 2214 06/06/16 0526 06/06/16 0912 06/06/16 1411  BP: 121/74 (!) 147/82    Pulse: 80 71    Resp: 17 17    Temp: 97.3 F (36.3 C) 98 F (36.7 C)    TempSrc: Oral Oral  SpO2: 97% 99% 100% 99%  Weight:      Height:        Intake/Output Summary (Last 24 hours) at 06/06/16 1611 Last data filed at 06/06/16 1400  Gross per 24 hour  Intake              150 ml  Output                0 ml  Net              150 ml   Filed Weights   06/05/16 1039  Weight: 64.7 kg (142 lb 10.2 oz)    Examination:  General exam: Appears calm and comfortable  Respiratory system: clear to auscultation, normal respiratory effort, nasal cannula in place Cardiovascular system: S1  & S2 heard, RRR. No JVD, murmurs, rubs, gallops or clicks. No pedal edema. Gastrointestinal system: Abdomen is nondistended, soft and nontender. No organomegaly or masses felt. Normal bowel sounds heard. Central nervous system: Alert and oriented. No focal neurological deficits. Extremities: Symmetric 5 x 5 power. Skin: No rashes, lesions or ulcers Psychiatry: Judgement and insight appear normal. Mood & affect appropriate.     Data Reviewed: I have personally reviewed following labs and imaging studies  CBC:  Recent Labs Lab 06/04/16 2026 06/05/16 0737  WBC 13.0* 14.6*  NEUTROABS 11.5*  --   HGB 14.2 12.4  HCT 42.7 36.5  MCV 98.2 97.3  PLT 194 [id number]   Basic Metabolic Panel:  Recent Labs Lab 06/04/16 2026 06/05/16 0737  NA 138 140  K 3.2* 3.6  CL 104 106  CO2 24 25  GLUCOSE 149* 147*  BUN 12 12  CREATININE 0.61 0.64  CALCIUM 8.8* 8.6*   GFR: Estimated Creatinine Clearance: 65.1 mL/min (by C-G formula based on SCr of 0.64 mg/dL). Liver Function Tests: No results for input(s): AST, ALT, ALKPHOS, BILITOT, PROT, ALBUMIN in the last 168 hours. No results for input(s): LIPASE, AMYLASE in the last 168 hours. No results for input(s): AMMONIA in the last 168 hours. Coagulation Profile: No results for input(s): INR, PROTIME in the last 168 hours. Cardiac Enzymes:  Recent Labs Lab 06/05/16 0737  TROPONINI <0.03   BNP (last 3 results) No results for input(s): PROBNP in the last 8760 hours. HbA1C:  Recent Labs  06/04/16 2026  HGBA1C 5.2   CBG:  Recent Labs Lab 06/05/16 1424 06/05/16 1716 06/05/16 2211 06/06/16 0815 06/06/16 1208  GLUCAP 149* 122* 161* 96 120*   Lipid Profile: No results for input(s): CHOL, HDL, LDLCALC, TRIG, CHOLHDL, LDLDIRECT in the last 72 hours. Thyroid Function Tests: No results for input(s): TSH, T4TOTAL, FREET4, T3FREE, THYROIDAB in the last 72 hours. Anemia Panel: No results for input(s): VITAMINB12, FOLATE, FERRITIN, TIBC, IRON,  RETICCTPCT in the last 72 hours. Sepsis Labs: No results for input(s): PROCALCITON, LATICACIDVEN in the last 168 hours.  Recent Results (from the past 240 hour(s))  Culture, expectorated sputum-assessment     Status: None   Collection Time: 06/06/16  8:05 AM  Result Value Ref Range Status   Specimen Description SPUTUM  Final   Special Requests Normal  Final   Sputum evaluation THIS SPECIMEN IS ACCEPTABLE FOR SPUTUM CULTURE  Final   Report Status 06/06/2016 FINAL  Final  Culture, respiratory (NON-Expectorated)     Status: None (Preliminary result)   Collection Time: 06/06/16  8:05 AM  Result Value Ref Range Status   Specimen Description SPUTUM  Final   Special Requests NONE  Final  Gram Stain   Final    MODERATE WBC PRESENT, PREDOMINANTLY PMN ABUNDANT GRAM POSITIVE RODS FEW GRAM POSITIVE COCCI IN PAIRS Performed at [facility]    Culture PENDING  Incomplete   Report Status PENDING  Incomplete         Radiology Studies: Dg Chest 2 View  Result Date: 06/04/2016 CLINICAL DATA:  Increasing shortness of breath. EXAM: CHEST  2 VIEW COMPARISON:  08/22/2015. FINDINGS: Severe emphysema. Bilateral nodular densities without noted progression since previous chest x-ray or CT. There is no edema, consolidation, effusion, or pneumothorax. Normal heart size and mediastinal contours. IMPRESSION: 1. Emphysema without acute superimposed finding. 2. Known pulmonary nodules as described on 01/01/2016 chest CT. Electronically Signed   By: Monte Fantasia M.D.   On: 06/04/2016 21:02   Ct Chest W Contrast  Result Date: 06/05/2016 CLINICAL DATA:  70 year old female with shortness of breath for the past 8 days. Emphysema. EXAM: CT CHEST WITH CONTRAST TECHNIQUE: Multidetector CT imaging of the chest was performed during intravenous contrast administration. CONTRAST:  70mL ISOVUE-300 IOPAMIDOL (ISOVUE-300) INJECTION 61% COMPARISON:  Chest CT 01/01/2016. FINDINGS: Cardiovascular: Heart size is  normal. There is no significant pericardial fluid, thickening or pericardial calcification. Aortic atherosclerosis, without evidence of aneurysm or dissection. No definite coronary artery calcifications. Mediastinum/Nodes: No pathologically enlarged mediastinal or hilar lymph nodes. Esophagus is unremarkable in appearance. With no axillary lymphadenopathy. Lungs/Pleura: Diffuse bronchial wall thickening with severe centrilobular and moderate paraseptal emphysema. Compared to the prior examination there are again numerous nodular areas of architectural distortion and consolidation, which are strongly favored to be infectious or inflammatory in etiology. Several of these areas have progressed compared to the prior study, best demonstrated by a significantly smaller nodule in the posterior aspect of the left upper lobe (image 41 of series 5) which currently measures 10 x 8 mm (previously 15 x 17 mm on 01/01/2016). Other nodular areas are new compared to the prior examination, but also favored to be areas of consolidation, most notable for an ovoid shaped nodular area in the medial aspect of the left upper lobe (image 40 of series 5) measuring 2.8 x 1.0 cm. More well-defined solid-appearing nodule in the posterior aspect of the right lower lobe (Image 133 of series 5) is stable over numerous prior examinations measuring 9 x 12 mm, favored to be a benign lesion such as a hamartoma. There is also some ill-defined consolidation in the basal segments of the left lower lobe at the extreme left lung base which suggests a pneumonia or sequela of aspiration. Trace left pleural effusion lying dependently. Upper Abdomen: Aortic atherosclerosis. Musculoskeletal: Bilateral breast implants incidentally noted. There are no aggressive appearing lytic or blastic lesions noted in the visualized portions of the skeleton. IMPRESSION: 1. Airspace consolidation in the basal segments of the left lower lobe, concerning for either pneumonia or  sequela of recent aspiration. There is a trace dependent left pleural effusion associated with this. 2. Multifocal for aggressive appearing nodules again noted throughout the lungs bilaterally. Several of these have regressed compared to the prior studies, while others are new. These remain most compatible with infectious/inflammatory nodules rather than neoplasm. 3. Stable benign appearing 12 x 9 mm nodule in the right lower lobe, likely a small hamartoma. 4. Diffuse bronchial wall thickening with severe centrilobular and moderate paraseptal emphysema; imaging findings suggestive of underlying COPD. 5. Aortic atherosclerosis. Electronically Signed   By: Vinnie Langton M.D.   On: 06/05/2016 09:22  Scheduled Meds: . citalopram  10 mg Oral Daily  . enoxaparin (LOVENOX) injection  40 mg Subcutaneous Q24H  . fluticasone  2 spray Each Nare Daily  . guaiFENesin  1,200 mg Oral BID  . insulin aspart  0-15 Units Subcutaneous TID WC  . ipratropium-albuterol  3 mL Nebulization Q6H  . levofloxacin (LEVAQUIN) IV  750 mg Intravenous Q24H  . loratadine  10 mg Oral Daily  . [START ON 06/07/2016] methylPREDNISolone (SOLU-MEDROL) injection  40 mg Intravenous Daily  . mometasone-formoterol  2 puff Inhalation BID  . montelukast  10 mg Oral QHS   Continuous Infusions:   LOS: 2 days    Time spent: 30 minutes    Loretha Stapler, MD [facility] Pager [phone]  If 7PM-7AM, please contact night-coverage [email or web address] Password [email or web address] 06/06/2016, 4:11 PM

## 2016-06-06 NOTE — Consult Note (Signed)
PULMONARY / CRITICAL CARE MEDICINE   Name: Courtney Ayers MRN: [id number] DOB: 1945-09-29    ADMISSION DATE:  06/04/2016 CONSULTATION DATE:  06/06/2016  REFERRING MD:  Dr. Adair Patter  CHIEF COMPLAINT:  Shortness of breath  HISTORY OF PRESENT ILLNESS:   70 year old female with COPD well-known to me for the same presented to the [facility] emergency department 2 days ago with shortness of breath. She said that she had been out to a party a few days prior with friends where she had one drink and then went home because she felt tired. In the ensuing 2-3 days afterwards she developed increasing shortness of breath on exertion and a left-sided chest pain. She felt chest congestion but does not produce mucus. She also felt chills with some nausea and vomiting. She does not recall being around anyone who is sick. Because the dyspnea progressed she called her sister and they decided that she should come to the emergency department for further evaluation. In the emergency department she had a CT scan which showed consolidative changes in the left lung in addition to her known severe emphysema. She also is again noted to have multiple nodules.  PAST MEDICAL HISTORY :  She  has a past medical history of Adenomatous colon polyp; Allergic rhinitis, cause unspecified; COPD (chronic obstructive pulmonary disease) ([place]); Depressive disorder, not elsewhere classified (2006); OA (osteoarthritis); and Osteopenia.  PAST SURGICAL HISTORY: She  has a past surgical history that includes Tonsillectomy and adenoidectomy (1951); Nasal septum surgery (1987); Breast enhancement surgery (1978); Cosmetic surgery; INGUINIAL HERNIA REPAIR (2004); Lipoma excision; Ganglion cyst excision; Colonoscopy (08/2011); Melanoma excision (2013); and Video bronchoscopy (Bilateral, 08/08/2015).  No Known Allergies  No current facility-administered medications on file prior to encounter.    Current Outpatient Prescriptions on File Prior  to Encounter  Medication Sig  . Calcium Carbonate-Vitamin D (CALCIUM-VITAMIN D) 500-200 MG-UNIT tablet Take 1 tablet by mouth daily.  . citalopram (CELEXA) 10 MG tablet take 1 tablet by mouth once daily  . fluticasone (FLONASE) 50 MCG/ACT nasal spray Place 2 sprays into both nostrils daily.  . Fluticasone-Salmeterol (ADVAIR) 250-50 MCG/DOSE AEPB Inhale 1 puff into the lungs every 12 (twelve) hours.  [name] guaiFENesin (MUCINEX) 600 MG 12 hr tablet Take 1,200 mg by mouth 2 (two) times daily.   [name] ibuprofen (ADVIL,MOTRIN) 200 MG tablet Take 400 mg by mouth every 8 (eight) hours as needed for mild pain. Reported on 08/22/2015  . montelukast (SINGULAIR) 10 MG tablet Take 1 tablet (10 mg total) by mouth at bedtime.  . Multiple Vitamin (MULTIVITAMIN WITH MINERALS) TABS tablet Take 1 tablet by mouth daily.  [name] SPIRIVA HANDIHALER 18 MCG inhalation capsule INHALE THE CONTENTS OF 1 CAPSULE ONCE DAILY USING HANDIHALER AS DIRECTED  . UNABLE TO FIND Med Name: Asthalin 157mcg - 2 puff PRN Pt gets through [place]    FAMILY HISTORY:  Her indicated that her mother is deceased. She indicated that her father is deceased. She indicated that only one of her three sisters is alive. She indicated that the status of her maternal grandmother is unknown. She indicated that the status of her maternal grandfather is unknown. She indicated that the status of her paternal grandmother is unknown. She indicated that the status of her paternal grandfather is unknown. She indicated that the status of her maternal uncle is unknown.    SOCIAL HISTORY: She  reports that she quit smoking about 22 months ago. Her smoking use included Cigarettes. She has a 45.00 pack-year smoking  history. She has never used smokeless tobacco. She reports that she drinks alcohol. She reports that she does not use drugs.  REVIEW OF SYSTEMS:   Gen: per HPI HEENT: Denies blurred vision, double vision, hearing loss, tinnitus, sinus congestion, rhinorrhea, sore  throat, neck stiffness, dysphagia PULM: per HPI CV: Denies chest pain, edema, orthopnea, paroxysmal nocturnal dyspnea, palpitations GI: Denies abdominal pain, nausea, vomiting, diarrhea, hematochezia, melena, constipation, change in bowel habits GU: Denies dysuria, hematuria, polyuria, oliguria, urethral discharge Endocrine: Denies hot or cold intolerance, polyuria, polyphagia or appetite change Derm: Denies rash, dry skin, scaling or peeling skin change Heme: Denies easy bruising, bleeding, bleeding gums Neuro: Denies headache, numbness, weakness, slurred speech, loss of memory or consciousness   SUBJECTIVE:  As above  VITAL SIGNS: BP (!) 147/82 (BP Location: Right Arm)   Pulse 71   Temp 98 F (36.7 C) (Oral)   Resp 17   Ht 5\' 8"  (1.727 m)   Wt 64.7 kg (142 lb 10.2 oz)   LMP 06/10/1992   SpO2 100%   BMI 21.69 kg/m   HEMODYNAMICS:    VENTILATOR SETTINGS:    INTAKE / OUTPUT: I/O last 3 completed shifts: In: 120 [P.O.:120] Out: -   PHYSICAL EXAMINATION: General:  Chronically ill-appearing, sitting up in bed Neuro:  Awake, alert, oriented 4 HEENT:  Normocephalic atraumatic, oropharynx clear Cardiovascular:  Regular rate and rhythm, no murmurs gallops or rubs Lungs:  Surprisingly clear to auscultation with no wheezing, normal effort Abdomen:  Bowel sounds positive, nontender nondistended Musculoskeletal:  Normal bulk and tone Skin:  No rash or skin breakdown  LABS:  BMET  Recent Labs Lab 06/04/16 2026 06/05/16 0737  NA 138 140  K 3.2* 3.6  CL 104 106  CO2 24 25  BUN 12 12  CREATININE 0.61 0.64  GLUCOSE 149* 147*    Electrolytes  Recent Labs Lab 06/04/16 2026 06/05/16 0737  CALCIUM 8.8* 8.6*    CBC  Recent Labs Lab 06/04/16 2026 06/05/16 0737  WBC 13.0* 14.6*  HGB 14.2 12.4  HCT 42.7 36.5  PLT 194 172    Coag's No results for input(s): APTT, INR in the last 168 hours.  Sepsis Markers No results for input(s): LATICACIDVEN,  PROCALCITON, O2SATVEN in the last 168 hours.  ABG No results for input(s): PHART, PCO2ART, PO2ART in the last 168 hours.  Liver Enzymes No results for input(s): AST, ALT, ALKPHOS, BILITOT, ALBUMIN in the last 168 hours.  Cardiac Enzymes  Recent Labs Lab 06/05/16 0737  TROPONINI <0.03    Glucose  Recent Labs Lab 06/05/16 0750 06/05/16 1424 06/05/16 1716 06/05/16 2211 06/06/16 0815 06/06/16 1208  GLUCAP 144* 149* 122* 161* 96 120*    Imaging No results found.   STUDIES:  12/26 chest x-ray images personally reviewed showing severe emphysema, multiple pulmonary nodules once again noted, one in the left upper lobe has increased in size, others in the lower lobes of decreased in size. There is also consolidative changes in the left lower lobe.  CULTURES: 12/28 respiratory culture shows GPC's in pairs on Gram stain, abundant gram-negative rods  ANTIBIOTICS: December 26 doxycycline through December 28 December 28 Levaquin   DISCUSSION: Pleasant 70 year old female well-known to me from clinic for her severe COPD and emphysema presents with acute on chronic respiratory failure with hypoxemia secondary to community-acquired pneumonia. Currently she does not appear to have evidence of a COPD exacerbation based on my physical exam. She also has waxing and waning pulmonary nodules which are never quite  been able to explain despite a bronchoscopy multiple CT scans. Our best guess is that this is an unexplained inflammatory etiology.  ASSESSMENT / PLAN:  PULMONARY A: Community-acquired pneumonia Centrilobular emphysema Acute respiratory failure with hypoxemia Pulmonary nodules P:   Change doxycycline to Levaquin Chest x-ray when necessary Continue Solu-Medrol but I will decrease the dose to 40 mg daily We'll plan on weaning off of the steroids over the next few days Ad Dulera 2 puffs twice a day Continue DuoNeb Ambulate as able [facility] follow  Pulmonary and [place] Pager: [phone]  06/06/2016, 1:20 PM

## 2016-06-06 NOTE — Progress Notes (Signed)
CSW consulted due to COPD GOLD protocol. Past admissions reviewed. Pt does not meet criteria for COPD GOLD protocol.  CSW signing off.  Kennon Encinas LCSW [phone] 

## 2016-06-06 NOTE — Evaluation (Signed)
Physical Therapy Evaluation Patient Details Name: DALEYSSA PULLIAM MRN: [id number] DOB: June 18, 1945 Today's Date: 06/06/2016   History of Present Illness  This 70 y.o. female admitted with acute on chronic COPD exacerbation, prolonged QT on EKG.   PMH includes severe COPD, depression, OA, osteopenia  Clinical Impression  On eval, pt required Min guard-Min assist (int to stabilize) for mobility. She walked ~75 feet while pushing O2 tank on the carrier. O2 sats 91% on 2L O2 (85% on RA during OT session per OT), dyspnea 2-3/4. Will follow during hospital stay. Recommend daily ambulation in hallway with nursing supervision as well.     Follow Up Recommendations Home health PT vs No PT follow up (depending on progress-may not need PT f/u)    Equipment Recommendations  None recommended by PT    Recommendations for Other Services       Precautions / Restrictions Precautions Precautions: None Precaution Comments: monitor O2 sats Restrictions Weight Bearing Restrictions: No      Mobility  Bed Mobility Overal bed mobility: Independent                Transfers Overall transfer level: Modified independent                  Ambulation/Gait Ambulation/Gait assistance: Min assist;Min guard Ambulation Distance (Feet): 75 Feet Assistive device:  (pt pushed O2 tank) Gait Pattern/deviations: Step-through pattern;Decreased stride length     General Gait Details: 2 standing rest breaks taken. O2 sats 91% on 2L [place] O2. dyspnea 2-3/4  Stairs            Wheelchair Mobility    Modified Rankin (Stroke Patients Only)       Balance Overall balance assessment: No apparent balance deficits (not formally assessed)                                           Pertinent Vitals/Pain Pain Assessment: Faces Pain Score: 3  Faces Pain Scale: Hurts a little bit Pain Location: hip - chronic arthritis  Pain Descriptors / Indicators: Aching Pain Intervention(s):  Monitored during session    Home Living Family/patient expects to be discharged to:: Private residence Living Arrangements: Children Available Help at Discharge: Family;Available PRN/intermittently Type of Home: House Home Access: Stairs to enter   Entrance Stairs-Number of Steps: 3 Home Layout: One level Home Equipment: Cane - single point Additional Comments: 3 y.o. 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  may have a tub seat at home     Prior Function Level of Independence: Independent;Independent with assistive device(s)         Comments: Pt reports she is fully independent with ADLs, community activities.  She uses SPC intermittently due to hip pain and resultant instability.      Hand Dominance   Dominant Hand: Right    Extremity/Trunk Assessment   Upper Extremity Assessment Upper Extremity Assessment: Defer to OT evaluation    Lower Extremity Assessment Lower Extremity Assessment: Generalized weakness    Cervical / Trunk Assessment Cervical / Trunk Assessment: Normal  Communication   Communication: No difficulties  Cognition Arousal/Alertness: Awake/alert Behavior During Therapy: WFL for tasks assessed/performed Overall Cognitive Status: Within Functional Limits for tasks assessed                      General Comments General comments (skin  integrity, edema, etc.): 02 sats on RA 85%, recovered to low to mid 90s on 2L supplemental 02, and pursed lip breathing     Exercises     Assessment/Plan    PT Assessment Patient needs continued PT services  PT Problem List Decreased mobility;Decreased activity tolerance          PT Treatment Interventions Gait training;Therapeutic activities;Therapeutic exercise;Patient/family education;Functional mobility training    PT Goals (Current goals can be found in the Care Plan section)  Acute Rehab PT Goals Patient Stated Goal: to go home and get back to independent  PT Goal  Formulation: With patient Time For Goal Achievement: 06/20/16 Potential to Achieve Goals: Good    Frequency Min 3X/week   Barriers to discharge        Co-evaluation               End of Session Equipment Utilized During Treatment: Oxygen Activity Tolerance: Patient limited by fatigue (Limited by dyspnea) Patient left: in bed;with call bell/phone within reach           Time: [id number] PT Time Calculation (min) (ACUTE ONLY): 13 min   Charges:   PT Evaluation $PT Eval Low Complexity: 1 Procedure     PT G Codes:        Weston Anna, MPT Pager: [phone]

## 2016-06-06 NOTE — Evaluation (Signed)
Occupational Therapy Evaluation Patient Details Name: Courtney Ayers MRN: [id number] DOB: [birth year] Today's Date: 06/06/2016    History of Present Illness This 70 y.o. female admitted with acute on chronic COPD exacerbation, prolonged QT on EKG.   PMH includes severe COPD, depression, OA, osteopenia   Clinical Impression   Pt admitted with above. She demonstrates the below listed deficits and will benefit from continued OT to maximize safety and independence with BADLs.  Pt presents to OT with decreased activity tolerance, generalized weakness.  She is supervision to mod I with ADLs, but fatigues and requires frequent rest breaks.  02 sats RA 85%, recovered to 92-96% on 2L and pursed lip breathing.  Will see acutely to maximize safety and independence.         Follow Up Recommendations  No OT follow up;Supervision - Intermittent    Equipment Recommendations  Tub/shower seat    Recommendations for Other Services       Precautions / Restrictions Precautions Precautions: None      Mobility Bed Mobility Overal bed mobility: Independent                Transfers Overall transfer level: Modified independent                    Balance Overall balance assessment: No apparent balance deficits (not formally assessed)                                          ADL Overall ADL's : Needs assistance/impaired Eating/Feeding: Independent   Grooming: Wash/dry hands;Wash/dry face;Oral care;Brushing hair;Modified independent   Upper Body Bathing: Supervision/ safety;Sitting   Lower Body Bathing: Supervison/ safety;Sit to/from stand   Upper Body Dressing : Set up;Sitting   Lower Body Dressing: Supervision/safety;Sit to/from stand   Toilet Transfer: Ambulation;Comfort height toilet;Grab bars;Modified Independent   Toileting- Clothing Manipulation and Hygiene: Modified independent;Sit to/from stand       Functional mobility during ADLs:  Supervision/safety;Modified independent General ADL Comments: Pt fatigues quickly with activity DOE 3/4 - 4/4.  discussed use of shower seat initially upon dischareg         Vision     Perception     Praxis      Pertinent Vitals/Pain Pain Assessment: Faces Faces Pain Scale: Hurts a little bit Pain Location: hip - chronic arthritis  Pain Descriptors / Indicators: Aching Pain Intervention(s): Monitored during session     Hand Dominance Right   Extremity/Trunk Assessment Upper Extremity Assessment Upper Extremity Assessment: Overall WFL for tasks assessed   Lower Extremity Assessment Lower Extremity Assessment: Defer to PT evaluation   Cervical / Trunk Assessment Cervical / Trunk Assessment: Normal   Communication Communication Communication: No difficulties   Cognition Arousal/Alertness: Awake/alert Behavior During Therapy: WFL for tasks assessed/performed Overall Cognitive Status: Within Functional Limits for tasks assessed                     General Comments       Exercises       Shoulder Instructions      Home Living Family/patient expects to be discharged to:: Private residence Living Arrangements: Children Available Help at Discharge: Family;Available PRN/intermittently Type of Home: House Home Access: Stairs to enter [employer] of Steps: 3   Home Layout: One level     Bathroom Shower/Tub: Tub/shower unit Shower/tub characteristics: Architectural technologist: Standard  Home Equipment: Kasandra Knudsen - single point   Additional Comments: 70 y.o. 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  may have a tub seat at home       Prior Functioning/Environment Level of Independence: Independent;Independent with assistive device(s)        Comments: Pt reports she is fully independent with ADLs, community activities.  She uses SPC intermittently due to hip pain and resultant instability.         OT Problem List:  Decreased strength;Decreased activity tolerance;Cardiopulmonary status limiting activity;Pain   OT Treatment/Interventions: Self-care/ADL training;Therapeutic exercise;Energy conservation;Patient/family education;Therapeutic activities    OT Goals(Current goals can be found in the care plan section) Acute Rehab OT Goals Patient Stated Goal: to go home and get back to independent  OT Goal Formulation: With patient Time For Goal Achievement: 06/20/16 Potential to Achieve Goals: Good ADL Goals Additional ADL Goal #1: Pt will be mod I with ADLs while incorporating energy conservation techniques   OT Frequency: Min 2X/week   Barriers to D/C:            Co-evaluation              End of Session Equipment Utilized During Treatment: Oxygen Nurse Communication: Mobility status  Activity Tolerance: Patient limited by fatigue Patient left: in bed;with call bell/phone within reach   Time: 1055-1116 OT Time Calculation (min): 21 min Charges:  OT General Charges $OT Visit: 1 Procedure OT Evaluation $OT Eval Low Complexity: 1 Procedure G-Codes:    Gwendolen Hewlett M 2016/07/05, 11:49 AM

## 2016-06-06 NOTE — Progress Notes (Signed)
Pharmacy Antibiotic Note  Courtney Ayers is a 70 y.o. female admitted on 06/04/2016 with COPD exacerbation.  Pharmacy has been consulted for Levaquin dosing.  Plan:  Levaquin 750mg  IV q24h  Monitor renal function and electrolytes  F/u sputum culture, de-escalate therapy as able  Height: 5\' 8"  (172.7 cm) Weight: 142 lb 10.2 oz (64.7 kg) IBW/kg (Calculated) : 63.9  Temp (24hrs), Avg:97.7 F (36.5 C), Min:97.3 F (36.3 C), Max:98 F (36.7 C)   Recent Labs Lab 06/04/16 2026 06/05/16 0737  WBC 13.0* 14.6*  CREATININE 0.61 0.64    Estimated Creatinine Clearance: 65.1 mL/min (by C-G formula based on SCr of 0.64 mg/dL).    No Known Allergies  Antimicrobials this admission:  12/27 Doxycycline >> 12/28 12/28 Levaquin >>  Dose adjustments this admission:  ---  Microbiology results:  12/28 Sputum: specimen acceptable, culture pending  Thank you for allowing pharmacy to be a part of this patient's care.  Peggyann Juba, PharmD, BCPS Pager: [phone] 06/06/2016 10:44 AM

## 2016-06-07 DIAGNOSIS — J181 Lobar pneumonia, unspecified organism: Secondary | ICD-10-CM

## 2016-06-07 DIAGNOSIS — J441 Chronic obstructive pulmonary disease with (acute) exacerbation: Principal | ICD-10-CM

## 2016-06-07 DIAGNOSIS — R0902 Hypoxemia: Secondary | ICD-10-CM

## 2016-06-07 DIAGNOSIS — R0602 Shortness of breath: Secondary | ICD-10-CM

## 2016-06-07 LAB — GLUCOSE, CAPILLARY
GLUCOSE-CAPILLARY: 151 mg/dL — AB (ref 65–99)
Glucose-Capillary: 91 mg/dL (ref 65–99)
Glucose-Capillary: 112 mg/dL — ABNORMAL HIGH (ref 65–99)
Glucose-Capillary: 219 mg/dL — ABNORMAL HIGH (ref 65–99)

## 2016-06-07 MED ORDER — GLYCERIN (LAXATIVE) 2.1 G RE SUPP
1.0000 | Freq: Once | RECTAL | Status: AC
  Administered 2016-06-07: 1 via RECTAL
  Filled 2016-06-07: qty 1

## 2016-06-07 MED ORDER — LIP MEDEX EX OINT
TOPICAL_OINTMENT | CUTANEOUS | Status: AC
  Administered 2016-06-07: 10:00:00
  Filled 2016-06-07: qty 7

## 2016-06-07 NOTE — Progress Notes (Signed)
[facility] PCCM  S: Feel a little better but didn't sleep, still has dyspnea with going to bathroom  O: Vitals:   06/06/16 1958 06/06/16 2126 06/07/16 0229 06/07/16 0518  BP:  131/77  (!) 149/79  Pulse:  79  72  Resp:  18  16  Temp:  98.1 F (36.7 C)  98.2 F (36.8 C)  TempSrc:  Oral  Oral  SpO2: 98% 96% 97% 97%  Weight:      Height:       2L [place]  Gen: well appearing, sitting up on side of bed HENT: OP clear, TM's clear, neck supple PULM: no wheezing, poor air movement CV: RRR, no mgr, trace edema GI: BS+, soft, nontender Derm: no cyanosis or rash Psyche: normal mood and affect  Impression/Plan: CAP: will be slow to resolve, rec 7 days levaquin, will need outpatient CXR with me in 2 weeks) Acute on chronic respiratory failure with hypoxemia: will need 24/7 O2 again (I think), would perform home O2 eval COPD: doesn't seem to be in exacerbation, would resume home bronchodilators at discharge  Dispo: D/C when walking around unit  PCCM will be available prn   [name], MD [place] [facility] Pager: [phone] Cell: [phone] After 3pm or if no response, call [phone]

## 2016-06-07 NOTE — Progress Notes (Signed)
Acapella/flutter device at patients bedside.

## 2016-06-07 NOTE — Care Management Note (Signed)
Case Management Note  Patient Details  Name: Courtney Ayers MRN: [id number] Date of Birth: 1945-12-30  Subjective/Objective:    70 yo admitted with COPD/bronchitis                Action/Plan: Pt from home with children. This CM met with pt at bedside to offer choice for HHPT/OT. Pt politely declines home health services. Pt unsure if she will need home 02 at discharge and states that she has needed home 02 in the past and used [facility]. This CM will await desaturation screen and home 02 orders. CM will continue to follow.  Expected Discharge Date:                  Expected Discharge Plan:  Home/Self Care  In-House Referral:     Discharge planning Services  CM Consult  Post Acute Care Choice:  Home Health Choice offered to:  Patient  DME Arranged:    DME Agency:     HH Arranged:    [place] Agency:     Status of Service:  In process, will continue to follow  If discussed at Long Length of Stay Meetings, dates discussed:    Additional CommentsLynnell Catalan, RN 06/07/2016, 10:03 AM  [phone]

## 2016-06-07 NOTE — Progress Notes (Signed)
PROGRESS NOTE    [name]  [id number] DOB: 10-29-1945 DOA: 06/04/2016 PCP: Wyatt Haste, MD    Brief Narrative:  Courtney Ayers is a 70 y.o. female with medical history significant of COPD (not on home O2), OA, and depression presenting with evolving symptoms over the last few days.  She kind of put it aside so that she could continue her holiday activities.  Unable to walk as far as usual, struggling to get air.  Followed usual medication regimens and used inhaler prn - 2-4 times daily. Noticed change in sputum, light yellow.  Yesterday with nausea and diarrhea.  Last night, went to her sister's for dinner and it was hard to get up her walkway.  Got into the house finally.  Slept ok.  This AM was cleaning house.  Noticed she was hungry but unable to eat because she was nauseated.  Usually drinks coffee in the AM and this breaks up mucus in her lungs but today mucus just wouldn't come up.  As the day progressed, nausea continued but no further diarrhea.  Noticed pain on left side under her ribcage (stomach area).  Discomfort in neck and down the middle of her back and in her head.  Symptoms just weren't getting any better so she decided to come in.     ED Course: Per Dr. Zenia Resides: Patient given Solu-Medrol prior to arrival. Given additional dose of albuterol here and patient continues to have wheezing. Patient will be admitted for COPD exacerbation    Assessment & Plan:   Principal Problem:   COPD (chronic obstructive pulmonary disease) with acute bronchitis (HCC) Active Problems:   Multiple pulmonary nodules   Prolonged Q-T interval on ECG   Hypokalemia   Hyperglycemia   Acute on chronic COPD exacerbation:  -She has severe COPD seen on CXR and may require home O2 at some time in the relatively near future.  -She did have penicillum on fungal culture recently and this has not been treated - Pulmonology consulted -Supplemental O2 as needed. -Nebulizers: scheduled  Duoneb and prn albuterol - steroids decreased per pulmonology - d/c doxycycline given risk of Pseudomonas   Pneumonia -Mucinex for cough  - sputum culture pending - will order flutter valve - will need to ambulate around unit - levaquin for 7 days - will need repeat CXR with Dr. Andres Ege in 2 weeks  Prolonged QT on EKG -Attempt to avoid QT prolonging medications if possible - resolved on repeat EKG  Hypokalemia -resolved  Hyperglycemia -Glucose 120 this am - seems well controlled even with steroids - HgA1c of 5.2 - Continue SSI   DVT prophylaxis: Lovenox  Code Status: DNR BUT would agree to be intubated for respiratory arrest (here for COPD) - confirmed with patient/family Family Communication: no family bedside today Disposition Plan: Home once clinically improved and able to ambulate around the unit- likely in 24 hours   Consultants:   Pulmonology  Procedures:   none  Antimicrobials:   Doxycycline 12/26>12/28  Ceftriaxone 12/27  Levaquin 12/28>   Subjective: Patient says this morning she is tired- she did not sleep well at night and says she needs a good nights rest.  Still feeling short of breath with ambulating to and from bathroom.  Worried she will be increasingly dyspneic at home.  Agreeable to going home with oxygen if necessary.   Objective: Vitals:   06/06/16 1958 06/06/16 2126 06/07/16 0229 06/07/16 0518  BP:  131/77  (!) 149/79  Pulse:  79  72  Resp:  18  16  Temp:  98.1 F (36.7 C)  98.2 F (36.8 C)  TempSrc:  Oral  Oral  SpO2: 98% 96% 97% 97%  Weight:      Height:       No intake or output data in the 24 hours ending 06/07/16 1526 Filed Weights   06/05/16 1039  Weight: 64.7 kg (142 lb 10.2 oz)    Examination:  General exam: Appears calm and comfortable  Respiratory system: clear to auscultation, normal respiratory effort, nasal cannula in place Cardiovascular system: S1 & S2 heard, RRR. No JVD, murmurs, rubs, gallops or  clicks. No pedal edema. Gastrointestinal system: Abdomen is nondistended, soft and nontender. No organomegaly or masses felt. Normal bowel sounds heard. Central nervous system: Alert and oriented. No focal neurological deficits. Extremities: Symmetric 5 x 5 power. Skin: No rashes, lesions or ulcers Psychiatry: Judgement and insight appear normal. Mood & affect appropriate.     Data Reviewed: I have personally reviewed following labs and imaging studies  CBC:  Recent Labs Lab 06/04/16 2026 06/05/16 0737  WBC 13.0* 14.6*  NEUTROABS 11.5*  --   HGB 14.2 12.4  HCT 42.7 36.5  MCV 98.2 97.3  PLT 194 [id number]   Basic Metabolic Panel:  Recent Labs Lab 06/04/16 2026 06/05/16 0737 06/06/16 2216  NA 138 140 138  K 3.2* 3.6 3.8  CL 104 106 101  CO2 24 25 30   GLUCOSE 149* 147* 115*  BUN 12 12 19   CREATININE 0.61 0.64 0.62  CALCIUM 8.8* 8.6* 8.5*  MG  --   --  2.2   GFR: Estimated Creatinine Clearance: 65.1 mL/min (by C-G formula based on SCr of 0.62 mg/dL). Liver Function Tests: No results for input(s): AST, ALT, ALKPHOS, BILITOT, PROT, ALBUMIN in the last 168 hours. No results for input(s): LIPASE, AMYLASE in the last 168 hours. No results for input(s): AMMONIA in the last 168 hours. Coagulation Profile: No results for input(s): INR, PROTIME in the last 168 hours. Cardiac Enzymes:  Recent Labs Lab 06/05/16 0737  TROPONINI <0.03   BNP (last 3 results) No results for input(s): PROBNP in the last 8760 hours. HbA1C:  Recent Labs  06/04/16 2026  HGBA1C 5.2   CBG:  Recent Labs Lab 06/06/16 1208 06/06/16 1631 06/06/16 2212 06/07/16 0805 06/07/16 1222  GLUCAP 120* 149* 100* 91 112*   Lipid Profile: No results for input(s): CHOL, HDL, LDLCALC, TRIG, CHOLHDL, LDLDIRECT in the last 72 hours. Thyroid Function Tests: No results for input(s): TSH, T4TOTAL, FREET4, T3FREE, THYROIDAB in the last 72 hours. Anemia Panel: No results for input(s): VITAMINB12, FOLATE,  FERRITIN, TIBC, IRON, RETICCTPCT in the last 72 hours. Sepsis Labs: No results for input(s): PROCALCITON, LATICACIDVEN in the last 168 hours.  Recent Results (from the past 240 hour(s))  Culture, expectorated sputum-assessment     Status: None   Collection Time: 06/06/16  8:05 AM  Result Value Ref Range Status   Specimen Description SPUTUM  Final   Special Requests Normal  Final   Sputum evaluation THIS SPECIMEN IS ACCEPTABLE FOR SPUTUM CULTURE  Final   Report Status 06/06/2016 FINAL  Final  Culture, respiratory (NON-Expectorated)     Status: None (Preliminary result)   Collection Time: 06/06/16  8:05 AM  Result Value Ref Range Status   Specimen Description SPUTUM  Final   Special Requests NONE  Final   Gram Stain   Final    MODERATE WBC PRESENT, PREDOMINANTLY PMN ABUNDANT GRAM POSITIVE RODS  FEW GRAM POSITIVE COCCI IN PAIRS    Culture   Final    CULTURE REINCUBATED FOR BETTER GROWTH Performed at [facility]    Report Status PENDING  Incomplete         Radiology Studies: No results found.      Scheduled Meds: . citalopram  10 mg Oral Daily  . enoxaparin (LOVENOX) injection  40 mg Subcutaneous Q24H  . fluticasone  2 spray Each Nare Daily  . guaiFENesin  1,200 mg Oral BID  . insulin aspart  0-15 Units Subcutaneous TID WC  . ipratropium-albuterol  3 mL Nebulization Q6H  . levofloxacin (LEVAQUIN) IV  750 mg Intravenous Q24H  . loratadine  10 mg Oral Daily  . methylPREDNISolone (SOLU-MEDROL) injection  40 mg Intravenous Daily  . mometasone-formoterol  2 puff Inhalation BID  . montelukast  10 mg Oral QHS   Continuous Infusions:   LOS: 3 days    Time spent: 30 minutes    Loretha Stapler, MD [facility] Pager [phone]  If 7PM-7AM, please contact night-coverage [email or web address] Password [facility] 06/07/2016, 3:26 PM

## 2016-06-07 NOTE — Progress Notes (Signed)
Occupational Therapy Treatment Patient Details Name: Courtney Ayers MRN: [id number] DOB: 1946-02-18 Today's Date: 06/07/2016    History of present illness This 70 y.o. female admitted with acute on chronic COPD exacerbation, prolonged QT on EKG.   PMH includes severe COPD, depression, OA, osteopenia   OT comments  Pt making progress with functional goals. Pt is Mod I with ADLs on RA at 89-91% O2 SATs. Pt did not demo SOB and did not fatigue easily with toileting, simulated bathing tasks, dressing task (donned gown and socks). Pt able to state 4 EC techniques for use at home during ADLs, home mgt and cooking. Pt states that she is aware that's she needs to pace her activities and take rest breaks during functional activities to conserve energy. OT will continue to follow acutely  Follow Up Recommendations  No OT follow up;Supervision - Intermittent    Equipment Recommendations  Tub/shower seat    Recommendations for Other Services      Precautions / Restrictions Precautions Precautions: None Precaution Comments: monitor O2 sats Restrictions Weight Bearing Restrictions: No       Mobility Bed Mobility Overal bed mobility: Independent                Transfers Overall transfer level: Modified independent Equipment used: None                  Balance Overall balance assessment: No apparent balance deficits (not formally assessed)                                 ADL       Grooming: Wash/dry hands;Wash/dry face;Modified independent   Upper Body Bathing: Modified independent (simulated)   Lower Body Bathing: Modified independent (simulated)   Upper Body Dressing : Modified independent   Lower Body Dressing: Modified independent   Toilet Transfer: Ambulation;Comfort height toilet;Grab bars;Modified Independent   Toileting- Clothing Manipulation and Hygiene: Modified independent;Sit to/from stand       Functional mobility during ADLs:  Modified independent General ADL Comments: Pt Mod I with ADLs and ADL mobility on RA at 89-91% O2 SATs. Pt able to recall 4 EC techniques for acitivty at home with ADLs, home mgt, cooking                                      Cognition   Behavior During Therapy: [facility] for tasks assessed/performed Overall Cognitive Status: Within Functional Limits for tasks assessed                                                  General Comments  pt very pleasant and cooperative    Pertinent Vitals/ Pain       Pain Assessment: No/denies pain                                                          Frequency  Min 2X/week        Progress Toward Goals  OT Goals(current goals can now be found in the  care plan section)  Progress towards OT goals: Progressing toward goals  Acute Rehab OT Goals Patient Stated Goal: to go home and get back to independent   Plan Discharge plan remains appropriate                     End of Session     Activity Tolerance Patient tolerated treatment well   Patient Left in bed;with call bell/phone within reach             Time: 1112-1136 OT Time Calculation (min): 24 min  Charges: OT General Charges $OT Visit: 1 Procedure OT Treatments $Self Care/Home Management : 8-22 mins $Therapeutic Activity: 8-22 mins  Britt Bottom 06/07/2016, 1:04 PM

## 2016-06-07 NOTE — Progress Notes (Signed)
Physical Therapy Treatment Patient Details Name: Courtney Ayers MRN: [id number] DOB: 1945-10-25 Today's Date: 06/07/2016    SATURATION QUALIFICATIONS: (This note is used to comply with regulatory documentation for home oxygen)  Patient Saturations on Room Air at Rest = 90%  Patient Saturations on Room Air while Ambulating =86%  Patient Saturations on 2 Liters of oxygen while Ambulating = 90%     History of Present Illness This 70 y.o. female admitted with acute on chronic COPD exacerbation, prolonged QT on EKG.   PMH includes severe COPD, depression, OA, osteopenia    PT Comments    Progressing slowly with mobility.   Follow Up Recommendations  No PT follow up (pt declines HHPT follow up)     Equipment Recommendations  None recommended by PT    Recommendations for Other Services       Precautions / Restrictions Precautions Precautions: Fall Precaution Comments: monitor O2 sats Restrictions Weight Bearing Restrictions: No    Mobility  Bed Mobility Overal bed mobility: Independent                Transfers Overall transfer level: Modified independent Equipment used: None                Ambulation/Gait Ambulation/Gait assistance: Min assist; Min guard assist Ambulation Distance (Feet): 150 Feet Assistive device:  (pt pushed O2 tank carrier) Gait Pattern/deviations: Step-through pattern     General Gait Details: 5 brief standing rest breaks taken. O2 sats 86% on RA, 90% on 2L. Dyspnea 2-3/4. Intermittent assist to steady.    Stairs            Wheelchair Mobility    Modified Rankin (Stroke Patients Only)       Balance Overall balance assessment: No apparent balance deficits (not formally assessed)                                  Cognition Arousal/Alertness: Awake/alert Behavior During Therapy: WFL for tasks assessed/performed Overall Cognitive Status: Within Functional Limits for tasks assessed                       Exercises      General Comments General comments (skin integrity, edema, etc.): 89-91% O2 SATs on RA for activity to bathroom and back to bed, pt sitting EOB      Pertinent Vitals/Pain Pain Assessment: No/denies pain    Home Living                      Prior Function            PT Goals (current goals can now be found in the care plan section) Acute Rehab PT Goals Patient Stated Goal: to go home and get back to independent  Progress towards PT goals: Progressing toward goals    Frequency    Min 3X/week      PT Plan Current plan remains appropriate    Co-evaluation             End of Session Equipment Utilized During Treatment: Oxygen Activity Tolerance: Patient limited by fatigue (Limited by dyspnea) Patient left: in bed;with call bell/phone within reach;with family/visitor present     Time: 1348-1400 PT Time Calculation (min) (ACUTE ONLY): 12 min  Charges:  $Gait Training: 8-22 mins  G Codes:      Weston Anna, MPT Pager: [phone]

## 2016-06-08 LAB — CULTURE, RESPIRATORY: CULTURE: NORMAL

## 2016-06-08 LAB — GLUCOSE, CAPILLARY
GLUCOSE-CAPILLARY: 96 mg/dL (ref 65–99)
Glucose-Capillary: 106 mg/dL — ABNORMAL HIGH (ref 65–99)
Glucose-Capillary: 112 mg/dL — ABNORMAL HIGH (ref 65–99)
Glucose-Capillary: 105 mg/dL — ABNORMAL HIGH (ref 65–99)

## 2016-06-08 LAB — CULTURE, RESPIRATORY W GRAM STAIN

## 2016-06-08 MED ORDER — LEVOFLOXACIN 750 MG PO TABS
750.0000 mg | ORAL_TABLET | Freq: Every day | ORAL | Status: DC
  Administered 2016-06-08 – 2016-06-09 (×2): 750 mg via ORAL
  Filled 2016-06-08 (×2): qty 1

## 2016-06-08 MED ORDER — PREDNISONE 20 MG PO TABS
40.0000 mg | ORAL_TABLET | Freq: Every day | ORAL | Status: DC
  Administered 2016-06-09: 40 mg via ORAL
  Filled 2016-06-08: qty 2

## 2016-06-08 NOTE — Progress Notes (Addendum)
Pharmacy Antibiotic Note  Courtney Ayers is a 70 y.o. female admitted on 06/04/2016 with COPD exacerbation.  Pharmacy has been consulted for Levaquin dosing.  Plan is for Levaquin x 7 days.  Today is day #3 Levaquin.  EKG 12/29, QTc = 429 ms  Plan: Change Levaquin to PO today since tolerating POs.  Levaquin 750mg  PO q24h. Renal function stable. Do not anticipate further need for dose adjustments. Pharmacy will sign off at this time.  Please re-consult if needed.  Height: 5\' 8"  (172.7 cm) Weight: 142 lb 10.2 oz (64.7 kg) IBW/kg (Calculated) : 63.9  Temp (24hrs), Avg:98.1 F (36.7 C), Min:97.6 F (36.4 C), Max:98.6 F (37 C)   Recent Labs Lab 06/04/16 2026 06/05/16 0737 06/06/16 2216  WBC 13.0* 14.6*  --   CREATININE 0.61 0.64 0.62    Estimated Creatinine Clearance: 65.1 mL/min (by C-G formula based on SCr of 0.62 mg/dL).    No Known Allergies  Antimicrobials this admission:  12/27 Doxy >> 12/28 12/28 Levaquin >>  Dose adjustments this admission:  -  Microbiology results:  12/28 Sputum: abundant GPR, few GPC pairs, cx pending  Thank you for allowing pharmacy to be a part of this patient's care.  Hershal Coria, PharmD, BCPS Pager: [phone] 06/08/2016 11:32 AM

## 2016-06-08 NOTE — Progress Notes (Signed)
PROGRESS NOTE    Courtney Ayers  [id number] DOB: 12/26/1945 DOA: 06/04/2016 PCP: Wyatt Haste, MD    Brief Narrative:  Courtney Ayers is a 70 y.o. female with medical history significant of COPD (not on home O2), OA, and depression presenting with sob associated with cough and nausea.  She was admitted for COPD exacerbation. She reports her breathing is better.       Assessment & Plan:   Principal Problem:   COPD (chronic obstructive pulmonary disease) with acute bronchitis (HCC) Active Problems:   Multiple pulmonary nodules   Prolonged Q-T interval on ECG   Hypokalemia   Hyperglycemia   Acute on chronic COPD exacerbation:  -She has severe COPD seen on CXR and may require home O2 at some time in the relatively near future.  -She did have penicillum on fungal culture recently and this has not been treated - Pulmonology consulted and recommendations given.  -Supplemental O2 as needed. -Nebulizers: scheduled Duoneb and prn albuterol - steroids decreased per pulmonology, transition to prednisone in am, .  - d/c doxycycline given risk of Pseudomonas, restarted levaquin on 12/30/ 17 to complete the 7 day course.  - recommend to follow up with pulmonologist in 2 weeks with a CXR.   Pneumonia - started on levaquin to complete the course.  - will need repeat CXR with Dr. Andres Ege in 2 weeks  Prolonged QT on EKG -Attempt to avoid QT prolonging medications if possible - resolved on repeat EKG  Hypokalemia -resolved  Hyperglycemia - seems well controlled even with steroids - HgA1c of 5.2.    DVT prophylaxis: Lovenox  Code Status: DNR BUT would agree to be intubated for respiratory arrest (here for COPD) - confirmed with patient/family Family Communication: no family bedside today Disposition Plan: Home once clinically improved and able to ambulate around the unit- likely in 24 hours   Consultants:   Pulmonology  Procedures:    none  Antimicrobials:   Doxycycline 12/26>12/28  Ceftriaxone 12/27  Levaquin 12/28>   Subjective: Patient reports breathing better. Cough is better.   Objective: Vitals:   06/08/16 1011 06/08/16 1015 06/08/16 1415 06/08/16 1508  BP:   130/75   Pulse:   81 78  Resp:   16 18  Temp:   98.3 F (36.8 C)   TempSrc:   Oral   SpO2: 94% 94% 99% 96%  Weight:      Height:        Intake/Output Summary (Last 24 hours) at 06/08/16 1743 Last data filed at 06/08/16 1545  Gross per 24 hour  Intake             1080 ml  Output                0 ml  Net             1080 ml   Filed Weights   06/05/16 1039  Weight: 64.7 kg (142 lb 10.2 oz)    Examination:  General exam: Appears calm and comfortable  Respiratory system: clear to auscultation, normal respiratory effort, nasal cannula in place Cardiovascular system: S1 & S2 heard, RRR. No JVD, murmurs, rubs, gallops or clicks. No pedal edema. Gastrointestinal system: Abdomen is nondistended, soft and nontender. No organomegaly or masses felt. Normal bowel sounds heard. Central nervous system: Alert and oriented. No focal neurological deficits. Extremities: Symmetric 5 x 5 power. Skin: No rashes, lesions or ulcers Psychiatry: Judgement and insight appear normal. Mood & affect appropriate.  Data Reviewed: I have personally reviewed following labs and imaging studies  CBC:  Recent Labs Lab 06/04/16 2026 06/05/16 0737  WBC 13.0* 14.6*  NEUTROABS 11.5*  --   HGB 14.2 12.4  HCT 42.7 36.5  MCV 98.2 97.3  PLT 194 [id number]   Basic Metabolic Panel:  Recent Labs Lab 06/04/16 2026 06/05/16 0737 06/06/16 2216  NA 138 140 138  K 3.2* 3.6 3.8  CL 104 106 101  CO2 24 25 30   GLUCOSE 149* 147* 115*  BUN 12 12 19   CREATININE 0.61 0.64 0.62  CALCIUM 8.8* 8.6* 8.5*  MG  --   --  2.2   GFR: Estimated Creatinine Clearance: 65.1 mL/min (by C-G formula based on SCr of 0.62 mg/dL). Liver Function Tests: No results for input(s):  AST, ALT, ALKPHOS, BILITOT, PROT, ALBUMIN in the last 168 hours. No results for input(s): LIPASE, AMYLASE in the last 168 hours. No results for input(s): AMMONIA in the last 168 hours. Coagulation Profile: No results for input(s): INR, PROTIME in the last 168 hours. Cardiac Enzymes:  Recent Labs Lab 06/05/16 0737  TROPONINI <0.03   BNP (last 3 results) No results for input(s): PROBNP in the last 8760 hours. HbA1C: No results for input(s): HGBA1C in the last 72 hours. CBG:  Recent Labs Lab 06/07/16 1612 06/07/16 2146 06/08/16 0753 06/08/16 1155 06/08/16 1725  GLUCAP 219* 151* 96 106* 112*   Lipid Profile: No results for input(s): CHOL, HDL, LDLCALC, TRIG, CHOLHDL, LDLDIRECT in the last 72 hours. Thyroid Function Tests: No results for input(s): TSH, T4TOTAL, FREET4, T3FREE, THYROIDAB in the last 72 hours. Anemia Panel: No results for input(s): VITAMINB12, FOLATE, FERRITIN, TIBC, IRON, RETICCTPCT in the last 72 hours. Sepsis Labs: No results for input(s): PROCALCITON, LATICACIDVEN in the last 168 hours.  Recent Results (from the past 240 hour(s))  Culture, expectorated sputum-assessment     Status: None   Collection Time: 06/06/16  8:05 AM  Result Value Ref Range Status   Specimen Description SPUTUM  Final   Special Requests Normal  Final   Sputum evaluation THIS SPECIMEN IS ACCEPTABLE FOR SPUTUM CULTURE  Final   Report Status 06/06/2016 FINAL  Final  Culture, respiratory (NON-Expectorated)     Status: None   Collection Time: 06/06/16  8:05 AM  Result Value Ref Range Status   Specimen Description SPUTUM  Final   Special Requests NONE  Final   Gram Stain   Final    MODERATE WBC PRESENT, PREDOMINANTLY PMN ABUNDANT GRAM POSITIVE RODS FEW GRAM POSITIVE COCCI IN PAIRS    Culture   Final    Consistent with normal respiratory flora. Performed at [facility]    Report Status 06/08/2016 FINAL  Final         Radiology Studies: No results  found.      Scheduled Meds: . citalopram  10 mg Oral Daily  . enoxaparin (LOVENOX) injection  40 mg Subcutaneous Q24H  . fluticasone  2 spray Each Nare Daily  . guaiFENesin  1,200 mg Oral BID  . insulin aspart  0-15 Units Subcutaneous TID WC  . ipratropium-albuterol  3 mL Nebulization Q6H  . levofloxacin  750 mg Oral Daily  . loratadine  10 mg Oral Daily  . methylPREDNISolone (SOLU-MEDROL) injection  40 mg Intravenous Daily  . mometasone-formoterol  2 puff Inhalation BID  . montelukast  10 mg Oral QHS   Continuous Infusions:   LOS: 4 days    Time spent: 30 minutes  Hosie Poisson, MD [facility] Pager [phone]  If 7PM-7AM, please contact night-coverage [email or web address] Password [facility] 06/08/2016, 5:43 PM

## 2016-06-09 LAB — GLUCOSE, CAPILLARY: GLUCOSE-CAPILLARY: 84 mg/dL (ref 65–99)

## 2016-06-09 MED ORDER — IPRATROPIUM-ALBUTEROL 0.5-2.5 (3) MG/3ML IN SOLN
3.0000 mL | Freq: Three times a day (TID) | RESPIRATORY_TRACT | Status: DC
  Filled 2016-06-09: qty 3

## 2016-06-09 MED ORDER — PREDNISONE 20 MG PO TABS: ORAL_TABLET | ORAL | 0 refills | Status: DC

## 2016-06-09 MED ORDER — LEVOFLOXACIN 750 MG PO TABS
750.0000 mg | ORAL_TABLET | Freq: Every day | ORAL | 0 refills | Status: DC
Start: 2016-06-10 — End: 2016-06-21

## 2016-06-09 MED ORDER — ALBUTEROL SULFATE (2.5 MG/3ML) 0.083% IN NEBU: 2.5000 mg | INHALATION_SOLUTION | RESPIRATORY_TRACT | 12 refills | Status: DC | PRN

## 2016-06-09 NOTE — Care Management Note (Signed)
Case Management Note  Patient Details  Name: Courtney Ayers MRN: [id number] Date of Birth: 1945/08/03  Subjective/Objective:     PNA               Action/Plan: Discharge Planning: AVS reviewed: NCM spoke to pt and lives with dtr. Explained [facility] will deliver portable to her room and concentrator to her home along with neb machine.    Expected Discharge Date:    06/09/2016              Expected Discharge Plan:  Home/Self Care  In-House Referral:  NA  Discharge planning Services  CM Consult  Post Acute Care Choice:  Home Health Choice offered to:  Patient  DME Arranged:  Oxygen, Nebulizer machine DME Agency:  [place]:  NA [place] Agency:  NA  Status of Service:  Completed, signed off  If discussed at [place] of Stay Meetings, dates discussed:    Additional Comments:  Erenest Rasher, RN 06/09/2016, 12:25 PM

## 2016-06-09 NOTE — Progress Notes (Signed)
Nurse reviewed discharge instructions with pt.  Pt verbalized understanding of discharge instructions, follow up appointments and new medications.  Portable O2 delivered to pt's room on unit prior to discharge.

## 2016-06-13 NOTE — Discharge Summary (Signed)
Physician Discharge Summary  [name] [id number] DOB: 1946/03/11 DOA: 06/04/2016  PCP: Wyatt Haste, MD  Admit date: 06/04/2016 Discharge date: 06/09/2016 Admitted From:[facility].  Disposition:  [facility].   Recommendations for Outpatient Follow-up:  1. Follow up with PCP in 1-2 weeks 2. Please obtain BMP/CBC in one week 3. Please follow up with pulmonology as recommended in 2 weeks with a CXR.     Equipment/Devices:oxygen.   Discharge Condition:stable.  CODE STATUS:full code.  Diet recommendation: Heart Healthy  Brief/Interim Summary: Courtney Ayers a 71 y.o.femalewith medical history significant of COPD (not on home O2), OA, and depression presenting with sob associated with cough and nausea.  She was admitted for COPD exacerbation. She reports her breathing is better.   Discharge Diagnoses:  Principal Problem:   COPD (chronic obstructive pulmonary disease) with acute bronchitis (HCC) Active Problems:   Multiple pulmonary nodules   Prolonged Q-T interval on ECG   Hypokalemia   Hyperglycemia  Acute on chronic COPD exacerbation: -She has severe COPD seen on CXR and may require home O2  -She did have penicillum on fungal culture recently and this has not been treated - Pulmonology consulted and recommendations given.  -Supplemental O2 as needed. -Nebulizers: as needed on discharge.  - steroids decreased per pulmonology, transition to prednisone on discharge.  - d/c doxycycline given risk of Pseudomonas, restarted levaquin on 12/30/ 17 to complete the 7 day course.  - recommend to follow up with pulmonologist in 2 weeks with a CXR.   Pneumonia - started on levaquin to complete the course.  - will need repeat CXR with Dr. Andres Ege in 2 weeks  Prolonged QT on EKG -Attempt to avoid QT prolonging medications if possible - resolved on repeat EKG  Hypokalemia -resolved  Hyperglycemia - seems well controlled even with steroids - HgA1c of 5.2.    Discharge Instructions  Discharge Instructions    Diet general    Complete by:  As directed    Discharge instructions    Complete by:  As directed    Follow up with PCP in one week.     Allergies as of 06/09/2016   No Known Allergies     Medication List    STOP taking these medications   ibuprofen 200 MG tablet Commonly known as:  ADVIL,MOTRIN     TAKE these medications   acetaminophen 650 MG CR tablet Commonly known as:  TYLENOL Take 650 mg by mouth every 8 (eight) hours as needed for pain.   albuterol (2.5 MG/3ML) 0.083% nebulizer solution Commonly known as:  PROVENTIL Take 3 mLs (2.5 mg total) by nebulization every 2 (two) hours as needed for wheezing or shortness of breath.   calcium-vitamin D 500-200 MG-UNIT tablet Take 1 tablet by mouth daily.   cetirizine 10 MG tablet Commonly known as:  ZYRTEC Take 10 mg by mouth daily.   citalopram 10 MG tablet Commonly known as:  CELEXA take 1 tablet by mouth once daily   fluticasone 50 MCG/ACT nasal spray Commonly known as:  FLONASE Place 2 sprays into both nostrils daily.   Fluticasone-Salmeterol 250-50 MCG/DOSE Aepb Commonly known as:  ADVAIR Inhale 1 puff into the lungs every 12 (twelve) hours.   guaiFENesin 600 MG 12 hr tablet Commonly known as:  MUCINEX Take 1,200 mg by mouth 2 (two) times daily.   levofloxacin 750 MG tablet Commonly known as:  LEVAQUIN Take 1 tablet (750 mg total) by mouth daily.   montelukast 10 MG tablet Commonly known as:  SINGULAIR Take 1 tablet (10 mg total) by mouth at bedtime.   multivitamin with minerals Tabs tablet Take 1 tablet by mouth daily.   oxymetazoline 0.05 % nasal spray Commonly known as:  AFRIN Place 1 spray into both nostrils 2 (two) times daily as needed for congestion.   predniSONE 20 MG tablet Commonly known as:  DELTASONE Prednisone 40 mg daily for 2 days followed by  Prednisone 20 mg daily for 3 days and stop.   SPIRIVA HANDIHALER 18 MCG inhalation  capsule Generic drug:  tiotropium INHALE THE CONTENTS OF 1 CAPSULE ONCE DAILY USING HANDIHALER AS DIRECTED   UNABLE TO FIND Med Name: Asthalin 172mcg - 2 puff PRN Pt gets through [place]   vitamin C 500 MG tablet Commonly known as:  ASCORBIC ACID Take 500 mg by mouth daily.      Follow-up Information    Simonne Maffucci, MD. Schedule an appointment as soon as possible for a visit in 2 week(s).   Specialty:  Pulmonary Disease Why:  Needs repeat CXR in 2 weeks with Dr. Nat Math information: [place] [address] [phone]        Wyatt Haste, MD. Schedule an appointment as soon as possible for a visit in 1 week(s).   Specialty:  Family Medicine Contact information: [address] [place] [address] [phone]          No Known Allergies  Consultations:  PCCM   Procedures/Studies: Dg Chest 2 View  Result Date: 06/04/2016 CLINICAL DATA:  Increasing shortness of breath. EXAM: CHEST  2 VIEW COMPARISON:  08/22/2015. FINDINGS: Severe emphysema. Bilateral nodular densities without noted progression since previous chest x-ray or CT. There is no edema, consolidation, effusion, or pneumothorax. Normal heart size and mediastinal contours. IMPRESSION: 1. Emphysema without acute superimposed finding. 2. Known pulmonary nodules as described on 01/01/2016 chest CT. Electronically Signed   By: Monte Fantasia M.D.   On: 06/04/2016 21:02   Ct Chest W Contrast  Result Date: 06/05/2016 CLINICAL DATA:  71 year old female with shortness of breath for the past 8 days. Emphysema. EXAM: CT CHEST WITH CONTRAST TECHNIQUE: Multidetector CT imaging of the chest was performed during intravenous contrast administration. CONTRAST:  82mL ISOVUE-300 IOPAMIDOL (ISOVUE-300) INJECTION 61% COMPARISON:  Chest CT 01/01/2016. FINDINGS: Cardiovascular: Heart size is normal. There is no significant pericardial fluid, thickening or pericardial calcification. Aortic  atherosclerosis, without evidence of aneurysm or dissection. No definite coronary artery calcifications. Mediastinum/Nodes: No pathologically enlarged mediastinal or hilar lymph nodes. Esophagus is unremarkable in appearance. With no axillary lymphadenopathy. Lungs/Pleura: Diffuse bronchial wall thickening with severe centrilobular and moderate paraseptal emphysema. Compared to the prior examination there are again numerous nodular areas of architectural distortion and consolidation, which are strongly favored to be infectious or inflammatory in etiology. Several of these areas have progressed compared to the prior study, best demonstrated by a significantly smaller nodule in the posterior aspect of the left upper lobe (image 41 of series 5) which currently measures 10 x 8 mm (previously 15 x 17 mm on 01/01/2016). Other nodular areas are new compared to the prior examination, but also favored to be areas of consolidation, most notable for an ovoid shaped nodular area in the medial aspect of the left upper lobe (image 40 of series 5) measuring 2.8 x 1.0 cm. More well-defined solid-appearing nodule in the posterior aspect of the right lower lobe (Image 133 of series 5) is stable over numerous prior examinations measuring 9 x 12 mm, favored to be a  benign lesion such as a hamartoma. There is also some ill-defined consolidation in the basal segments of the left lower lobe at the extreme left lung base which suggests a pneumonia or sequela of aspiration. Trace left pleural effusion lying dependently. Upper Abdomen: Aortic atherosclerosis. Musculoskeletal: Bilateral breast implants incidentally noted. There are no aggressive appearing lytic or blastic lesions noted in the visualized portions of the skeleton. IMPRESSION: 1. Airspace consolidation in the basal segments of the left lower lobe, concerning for either pneumonia or sequela of recent aspiration. There is a trace dependent left pleural effusion associated with  this. 2. Multifocal for aggressive appearing nodules again noted throughout the lungs bilaterally. Several of these have regressed compared to the prior studies, while others are new. These remain most compatible with infectious/inflammatory nodules rather than neoplasm. 3. Stable benign appearing 12 x 9 mm nodule in the right lower lobe, likely a small hamartoma. 4. Diffuse bronchial wall thickening with severe centrilobular and moderate paraseptal emphysema; imaging findings suggestive of underlying COPD. 5. Aortic atherosclerosis. Electronically Signed   By: Vinnie Langton M.D.   On: 06/05/2016 09:22       Subjective: No complaints.   Discharge Exam: Vitals:   06/09/16 0537 06/09/16 0731  BP: 131/68   Pulse: 73 78  Resp: 16 18  Temp: 98.8 F (37.1 C)    Vitals:   06/08/16 2125 06/09/16 0247 06/09/16 0537 06/09/16 0731  BP: 126/75  131/68   Pulse: 75  73 78  Resp: 16  16 18   Temp: 97.7 F (36.5 C)  98.8 F (37.1 C)   TempSrc: Oral  Oral   SpO2: 96% 98% 100% 93%  Weight:      Height:        General: Pt is alert, awake, not in acute distress Cardiovascular: RRR, S1/S2 +, no rubs, no gallops Respiratory: CTA bilaterally, no wheezing, no rhonchi Abdominal: Soft, NT, ND, bowel sounds + Extremities: no edema, no cyanosis    The results of significant diagnostics from this hospitalization (including imaging, microbiology, ancillary and laboratory) are listed below for reference.     Microbiology: Recent Results (from the past 240 hour(s))  Culture, expectorated sputum-assessment     Status: None   Collection Time: 06/06/16  8:05 AM  Result Value Ref Range Status   Specimen Description SPUTUM  Final   Special Requests Normal  Final   Sputum evaluation THIS SPECIMEN IS ACCEPTABLE FOR SPUTUM CULTURE  Final   Report Status 06/06/2016 FINAL  Final  Culture, respiratory (NON-Expectorated)     Status: None   Collection Time: 06/06/16  8:05 AM  Result Value Ref Range Status    Specimen Description SPUTUM  Final   Special Requests NONE  Final   Gram Stain   Final    MODERATE WBC PRESENT, PREDOMINANTLY PMN ABUNDANT GRAM POSITIVE RODS FEW GRAM POSITIVE COCCI IN PAIRS    Culture   Final    Consistent with normal respiratory flora. Performed at [facility]    Report Status 06/08/2016 FINAL  Final     Labs: BNP (last 3 results) No results for input(s): BNP in the last 8760 hours. Basic Metabolic Panel:  Recent Labs Lab 06/06/16 2216  NA 138  K 3.8  CL 101  CO2 30  GLUCOSE 115*  BUN 19  CREATININE 0.62  CALCIUM 8.5*  MG 2.2   Liver Function Tests: No results for input(s): AST, ALT, ALKPHOS, BILITOT, PROT, ALBUMIN in the last 168 hours. No results for  input(s): LIPASE, AMYLASE in the last 168 hours. No results for input(s): AMMONIA in the last 168 hours. CBC: No results for input(s): WBC, NEUTROABS, HGB, HCT, MCV, PLT in the last 168 hours. Cardiac Enzymes: No results for input(s): CKTOTAL, CKMB, CKMBINDEX, TROPONINI in the last 168 hours. BNP: Invalid input(s): POCBNP CBG:  Recent Labs Lab 06/08/16 0753 06/08/16 1155 06/08/16 1725 06/08/16 2133 06/09/16 0737  GLUCAP 96 106* 112* 105* 84   D-Dimer No results for input(s): DDIMER in the last 72 hours. Hgb A1c No results for input(s): HGBA1C in the last 72 hours. Lipid Profile No results for input(s): CHOL, HDL, LDLCALC, TRIG, CHOLHDL, LDLDIRECT in the last 72 hours. Thyroid function studies No results for input(s): TSH, T4TOTAL, T3FREE, THYROIDAB in the last 72 hours.  Invalid input(s): FREET3 Anemia work up No results for input(s): VITAMINB12, FOLATE, FERRITIN, TIBC, IRON, RETICCTPCT in the last 72 hours. Urinalysis    Component Value Date/Time   COLORURINE YELLOW 06/05/2016 0840   APPEARANCEUR CLEAR 06/05/2016 0840   LABSPEC 1.018 06/05/2016 0840   PHURINE 5.0 06/05/2016 0840   GLUCOSEU NEGATIVE 06/05/2016 0840   HGBUR SMALL (A) 06/05/2016 0840   BILIRUBINUR  NEGATIVE 06/05/2016 0840   BILIRUBINUR neg 09/08/2012 1453   KETONESUR 5 (A) 06/05/2016 0840   PROTEINUR NEGATIVE 06/05/2016 0840   UROBILINOGEN negative 09/08/2012 1453   NITRITE NEGATIVE 06/05/2016 0840   LEUKOCYTESUR TRACE (A) 06/05/2016 0840   Sepsis Labs Invalid input(s): PROCALCITONIN,  WBC,  LACTICIDVEN Microbiology Recent Results (from the past 240 hour(s))  Culture, expectorated sputum-assessment     Status: None   Collection Time: 06/06/16  8:05 AM  Result Value Ref Range Status   Specimen Description SPUTUM  Final   Special Requests Normal  Final   Sputum evaluation THIS SPECIMEN IS ACCEPTABLE FOR SPUTUM CULTURE  Final   Report Status 06/06/2016 FINAL  Final  Culture, respiratory (NON-Expectorated)     Status: None   Collection Time: 06/06/16  8:05 AM  Result Value Ref Range Status   Specimen Description SPUTUM  Final   Special Requests NONE  Final   Gram Stain   Final    MODERATE WBC PRESENT, PREDOMINANTLY PMN ABUNDANT GRAM POSITIVE RODS FEW GRAM POSITIVE COCCI IN PAIRS    Culture   Final    Consistent with normal respiratory flora. Performed at [facility]    Report Status 06/08/2016 FINAL  Final     Time coordinating discharge: Over 30 minutes  SIGNED:   Hosie Poisson, MD  [facility] 06/13/2016, 3:55 PM Pager   If 7PM-7AM, please contact night-coverage [email or web address] Password [email or web address]

## 2016-06-20 ENCOUNTER — Ambulatory Visit (INDEPENDENT_AMBULATORY_CARE_PROVIDER_SITE_OTHER)
Admission: RE | Admit: 2016-06-20 | Discharge: 2016-06-20 | Disposition: A | Discharge status: Home / Self Care | Payer: Medicare Other | Source: Ambulatory Visit | Attending: Pulmonary Disease | Admitting: Pulmonary Disease

## 2016-06-20 DIAGNOSIS — R918 Other nonspecific abnormal finding of lung field: Secondary | ICD-10-CM | POA: Diagnosis not present

## 2016-06-20 DIAGNOSIS — J439 Emphysema, unspecified: Secondary | ICD-10-CM | POA: Diagnosis not present

## 2016-06-21 ENCOUNTER — Ambulatory Visit (INDEPENDENT_AMBULATORY_CARE_PROVIDER_SITE_OTHER): Payer: Medicare Other | Admitting: Adult Health

## 2016-06-21 ENCOUNTER — Encounter: Payer: Self-pay | Admitting: Adult Health

## 2016-06-21 DIAGNOSIS — J189 Pneumonia, unspecified organism: Secondary | ICD-10-CM | POA: Insufficient documentation

## 2016-06-21 DIAGNOSIS — J441 Chronic obstructive pulmonary disease with (acute) exacerbation: Secondary | ICD-10-CM

## 2016-06-21 DIAGNOSIS — J9611 Chronic respiratory failure with hypoxia: Secondary | ICD-10-CM | POA: Diagnosis not present

## 2016-06-21 DIAGNOSIS — R918 Other nonspecific abnormal finding of lung field: Secondary | ICD-10-CM | POA: Diagnosis not present

## 2016-06-21 MED ORDER — MONTELUKAST SODIUM 10 MG PO TABS: 10.0000 mg | ORAL_TABLET | Freq: Every day | ORAL | 11 refills | Status: DC

## 2016-06-21 NOTE — Assessment & Plan Note (Signed)
LLL PNA clinically improving and improved area on CT chest  No further abx at this time  Follow up in 2 weeks and As needed

## 2016-06-21 NOTE — Assessment & Plan Note (Signed)
Continue on o2  

## 2016-06-21 NOTE — Assessment & Plan Note (Addendum)
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 [place] Bells   Plan  . Patient Instructions  Continue on Advair and Spiriva .  Continue on o2 2l/m .  Continue on Mucinex Twice daily   Continue on Flutter valve.  follow up Dr. [place] Bells in 2 weeks as planned and As needed   Please contact office for sooner follow up if symptoms do not improve or worsen or seek emergency care

## 2016-06-21 NOTE — Assessment & Plan Note (Signed)
Recent exacerbation now resolving   Plan  Patient Instructions  Continue on Advair and Spiriva .  Continue on o2 2l/m .  Continue on Mucinex Twice daily   Continue on Flutter valve.  follow up Dr. [place] Bells in 2 weeks as planned and As needed   Please contact office for sooner follow up if symptoms do not improve or worsen or seek emergency care

## 2016-06-21 NOTE — Progress Notes (Signed)
@Patient  ID: Courtney Ayers, female    DOB: 01-31-1946, 71 y.o.   MRN: [id number]  Chief Complaint  Patient presents with  . Follow-up    COPD Courtney Ayers     Referring provider: [name], MD  HPI: 71 yo female former smoker followed for COPD/Bronchiectasis and Ayers nodules   TEST /Events  PFT on 09/2014 which showed an FEV1 of 0.81L (31% pred) CT chest in 07/2014 showed significant emphysema and a 1cm nodule PET CT 2015 showed the nodule was not FCG avid November 2016 CT chest: No change in previously seen nodules, in fact most are smaller, but there is a new 1.4 cm sub-solid nodule seen, recommend follow-up 3 months 07/2015 BAL finidings showing penicillium and cladosporium on BAL but no tissue biopsy.  06/21/2016 Follow up : Post hospital follow up /COPD  Pt returns for a post hospital follow up . She was admitted last month for COPD exacerbation and PNA .  CT chest done on 06/05/16 showed aspdz in LLL, multifocal aggressive appearing nodules in Ayers bilaterally. Several nodules had regressed. She was treated with Levaquin and steroid taper .  She has finished these and is feeling better . Cough/congestion are decreaesd. Breathing is returning to baseline but remains weaker.  No fever or n/v.d. Appetite is good.   Pt has known Ayers nodules that have waxed and waned on CT chest . Underwent FOB w/ Bal showing  penicillium and cladosporium . It was felt she could have an indolent mold which is not causing an active infection. She has seen by ID and plan was to follow up with serial CT chest  CT chest was done yesterday that showed improvement in LLL consolidation . There was new peripheral opacitiies LLL and RUL .   Uses Flutter valve. On Advair /Spiriva .   On O2 2l/m . Doing well .     No Known Allergies  Immunization History  Administered Date(s) Administered  . Influenza Split 07/03/2011, 02/14/2012  . Influenza Whole 05/16/2004  . Influenza, High Dose Seasonal PF  03/18/2013, 03/24/2014, 03/08/2015  . Influenza,inj,Quad PF,36+ Mos 03/21/2016  . PPD Test 05/30/1999  . Pneumococcal Conjugate-13 03/24/2014  . Pneumococcal Polysaccharide-23 08/06/2000, 01/30/2011  . Td 09/23/2001  . Tdap 01/30/2011  . Zoster 03/05/2011    Past Medical History:  Diagnosis Date  . Adenomatous colon polyp   . Allergic rhinitis, cause unspecified    seasonal  . COPD (chronic obstructive pulmonary disease) ([place])    no home O2  . Depressive disorder, not elsewhere classified 2006   some anxiety recurs when she has stopped med in past  . OA (osteoarthritis)    B THUMB, RIGHT > LEFT HIP, BACK  . Osteopenia     Tobacco History: History  Smoking Status  . Former Smoker  . Packs/day: 1.00  . Years: 45.00  . Types: Cigarettes  . Quit date: 08/05/2014  Smokeless Tobacco  . Never Used   Counseling given: Not Answered   Outpatient Encounter Prescriptions as of 06/21/2016  Medication Sig  . acetaminophen (TYLENOL) 650 MG CR tablet Take 650 mg by mouth every 8 (eight) hours as needed for pain.  [name] albuterol (PROVENTIL) (2.5 MG/3ML) 0.083% nebulizer solution Take 3 mLs (2.5 mg total) by nebulization every 2 (two) hours as needed for wheezing or shortness of breath.  . Calcium Carbonate-Vitamin D (CALCIUM-VITAMIN D) 500-200 MG-UNIT tablet Take 1 tablet by mouth daily.  . cetirizine (ZYRTEC) 10 MG tablet Take 10 mg by  mouth daily.  . citalopram (CELEXA) 10 MG tablet take 1 tablet by mouth once daily  . fluticasone (FLONASE) 50 MCG/ACT nasal spray Place 2 sprays into both nostrils daily.  . Fluticasone-Salmeterol (ADVAIR) 250-50 MCG/DOSE AEPB Inhale 1 puff into the lungs every 12 (twelve) hours.  [name] guaiFENesin (MUCINEX) 600 MG 12 hr tablet Take 1,200 mg by mouth 2 (two) times daily.   . montelukast (SINGULAIR) 10 MG tablet Take 1 tablet (10 mg total) by mouth at bedtime.  . Multiple Vitamin (MULTIVITAMIN WITH MINERALS) TABS tablet Take 1 tablet by mouth daily.  [name]  oxymetazoline (AFRIN) 0.05 % nasal spray Place 1 spray into both nostrils 2 (two) times daily as needed for congestion.  [name] SPIRIVA HANDIHALER 18 MCG inhalation capsule INHALE THE CONTENTS OF 1 CAPSULE ONCE DAILY USING HANDIHALER AS DIRECTED  . UNABLE TO FIND Med Name: Asthalin 165mcg - 2 puff PRN Pt gets through [place]  . vitamin C (ASCORBIC ACID) 500 MG tablet Take 500 mg by mouth daily.  . [DISCONTINUED] montelukast (SINGULAIR) 10 MG tablet Take 1 tablet (10 mg total) by mouth at bedtime.  . [DISCONTINUED] levofloxacin (LEVAQUIN) 750 MG tablet Take 1 tablet (750 mg total) by mouth daily.  . [DISCONTINUED] predniSONE (DELTASONE) 20 MG tablet Prednisone 40 mg daily for 2 days followed by  Prednisone 20 mg daily for 3 days and stop.   No facility-administered encounter medications on file as of 06/21/2016.      Review of Systems  Constitutional:   No  weight loss, night sweats,  Fevers, chills, + fatigue, or  lassitude.  HEENT:   No headaches,  Difficulty swallowing,  Tooth/dental problems, or  Sore throat,                No sneezing, itching, ear ache,  +nasal congestion, post nasal drip,   CV:  No chest pain,  Orthopnea, PND, swelling in lower extremities, anasarca, dizziness, palpitations, syncope.   GI  No heartburn, indigestion, abdominal pain, nausea, vomiting, diarrhea, change in bowel habits, loss of appetite, bloody stools.   Resp:  No chest wall deformity  Skin: no rash or lesions.  GU: no dysuria, change in color of urine, no urgency or frequency.  No flank pain, no hematuria   MS:  No joint pain or swelling.  No decreased range of motion.  No back pain.    Physical Exam  BP 122/70   Pulse 66   Temp 97.5 F (36.4 C) (Oral)   Ht 5\' 8"  (1.727 m)   Wt 142 lb (64.4 kg)   LMP 06/10/1992   SpO2 100%   BMI 21.59 kg/m   GEN: A/Ox3; pleasant , NAD, elderly and chronically ill appearing on O2    HEENT:  [place]/AT,  EACs-clear, TMs-wnl, NOSE-clear, THROAT-clear, no  lesions, no postnasal drip or exudate noted.   NECK:  Supple w/ fair ROM; no JVD; normal carotid impulses w/o bruits; no thyromegaly or nodules palpated; no lymphadenopathy.    RESP  Few trace rhonchi   no accessory muscle use, no dullness to percussion  CARD:  RRR, no m/r/g, no peripheral edema, pulses intact, no cyanosis or clubbing.  GI:   Soft & nt; nml bowel sounds; no organomegaly or masses detected.   Musco: Warm bil, no deformities or joint swelling noted.   Neuro: alert, no focal deficits noted.    Skin: Warm, no lesions or rashes  Psych:  No change in mood or affect. No depression or anxiety.  No  memory loss.  Lab Results:  CBC    Component Value Date/Time   WBC 14.6 (H) 06/05/2016 0737   RBC 3.75 (L) 06/05/2016 0737   HGB 12.4 06/05/2016 0737   HCT 36.5 06/05/2016 0737   PLT 172 06/05/2016 0737   MCV 97.3 06/05/2016 0737   MCH 33.1 06/05/2016 0737   MCHC 34.0 06/05/2016 0737   RDW 14.0 06/05/2016 0737   LYMPHSABS 0.9 06/04/2016 2026   MONOABS 0.6 06/04/2016 2026   EOSABS 0.0 06/04/2016 2026   BASOSABS 0.0 06/04/2016 2026    BMET    Component Value Date/Time   NA 138 06/06/2016 2216   K 3.8 06/06/2016 2216   CL 101 06/06/2016 2216   CO2 30 06/06/2016 2216   GLUCOSE 115 (H) 06/06/2016 2216   BUN 19 06/06/2016 2216   CREATININE 0.62 06/06/2016 2216   CREATININE 0.70 12/03/2012 1118   CALCIUM 8.5 (L) 06/06/2016 2216   GFRNONAA >60 06/06/2016 2216   GFRAA >60 06/06/2016 2216    BNP    Component Value Date/Time   BNP 44.5 08/05/2014 1003    ProBNP No results found for: PROBNP  Imaging: Dg Chest 2 View  Result Date: 06/04/2016 CLINICAL DATA:  Increasing shortness of breath. EXAM: CHEST  2 VIEW COMPARISON:  08/22/2015. FINDINGS: Severe emphysema. Bilateral nodular densities without noted progression since previous chest x-ray or CT. There is no edema, consolidation, effusion, or pneumothorax. Normal heart size and mediastinal contours. IMPRESSION:  1. Emphysema without acute superimposed finding. 2. Known pulmonary nodules as described on 01/01/2016 chest CT. Electronically Signed   By: Monte Fantasia M.D.   On: 06/04/2016 21:02   Ct Chest Wo Contrast  Result Date: 06/20/2016 CLINICAL DATA:  Recent pneumonia. Persistent non productive cough and shortness of breath. Emphysema. Indeterminate pulmonary nodules. EXAM: CT CHEST WITHOUT CONTRAST TECHNIQUE: Multidetector CT imaging of the chest was performed following the standard protocol without IV contrast. COMPARISON:  06/05/2016 and 01/01/2016 FINDINGS: Cardiovascular:  No acute findings. Aortic atherosclerosis. Mediastinum/Nodes: No masses or pathologically enlarged lymph nodes identified on this unenhanced exam. Lungs/Pleura: Moderate emphysema and biapical scarring remains stable. Overall decrease in left lower lobe airspace disease is seen since previous study, although there is increased size of a peripheral rounded opacity in the lateral left lower lobe measuring 2.7 x 1.5 cm on image 140/3. Given the rapid appearance of this opacity since the recent study, this most likely represents an infectious or inflammatory process. Similar ill-defined nodular opacity is seen in the lateral aspect of right upper lobe on image 72, which is also new and measures 13 mm. This is also likely inflammatory or infectious in etiology. Multiple other scattered bilateral pulmonary nodular densities remain stable. Upper Abdomen:  Unremarkable. Musculoskeletal:  No suspicious bone lesions. IMPRESSION: Overall improvement in left lower lobe airspace disease since previous study. New rounded peripheral opacities in the lateral left lower and right upper lobes, most consistent with infectious or inflammatory process given rapid appearance since recent exam. Other small scattered bilateral pulmonary nodular densities remain stable. Moderate emphysema. Aortic atherosclerosis. Electronically Signed   By: Earle Gell M.D.   On:  06/20/2016 14:26   Ct Chest W Contrast  Result Date: 06/05/2016 CLINICAL DATA:  71 year old female with shortness of breath for the past 8 days. Emphysema. EXAM: CT CHEST WITH CONTRAST TECHNIQUE: Multidetector CT imaging of the chest was performed during intravenous contrast administration. CONTRAST:  90mL ISOVUE-300 IOPAMIDOL (ISOVUE-300) INJECTION 61% COMPARISON:  Chest CT 01/01/2016. FINDINGS: Cardiovascular: Heart size  is normal. There is no significant pericardial fluid, thickening or pericardial calcification. Aortic atherosclerosis, without evidence of aneurysm or dissection. No definite coronary artery calcifications. Mediastinum/Nodes: No pathologically enlarged mediastinal or hilar lymph nodes. Esophagus is unremarkable in appearance. With no axillary lymphadenopathy. Lungs/Pleura: Diffuse bronchial wall thickening with severe centrilobular and moderate paraseptal emphysema. Compared to the prior examination there are again numerous nodular areas of architectural distortion and consolidation, which are strongly favored to be infectious or inflammatory in etiology. Several of these areas have progressed compared to the prior study, best demonstrated by a significantly smaller nodule in the posterior aspect of the left upper lobe (image 41 of series 5) which currently measures 10 x 8 mm (previously 15 x 17 mm on 01/01/2016). Other nodular areas are new compared to the prior examination, but also favored to be areas of consolidation, most notable for an ovoid shaped nodular area in the medial aspect of the left upper lobe (image 40 of series 5) measuring 2.8 x 1.0 cm. More well-defined solid-appearing nodule in the posterior aspect of the right lower lobe (Image 133 of series 5) is stable over numerous prior examinations measuring 9 x 12 mm, favored to be a benign lesion such as a hamartoma. There is also some ill-defined consolidation in the basal segments of the left lower lobe at the extreme left Ayers  base which suggests a pneumonia or sequela of aspiration. Trace left pleural effusion lying dependently. Upper Abdomen: Aortic atherosclerosis. Musculoskeletal: Bilateral breast implants incidentally noted. There are no aggressive appearing lytic or blastic lesions noted in the visualized portions of the skeleton. IMPRESSION: 1. Airspace consolidation in the basal segments of the left lower lobe, concerning for either pneumonia or sequela of recent aspiration. There is a trace dependent left pleural effusion associated with this. 2. Multifocal for aggressive appearing nodules again noted throughout the lungs bilaterally. Several of these have regressed compared to the prior studies, while others are new. These remain most compatible with infectious/inflammatory nodules rather than neoplasm. 3. Stable benign appearing 12 x 9 mm nodule in the right lower lobe, likely a small hamartoma. 4. Diffuse bronchial wall thickening with severe centrilobular and moderate paraseptal emphysema; imaging findings suggestive of underlying COPD. 5. Aortic atherosclerosis. Electronically Signed   By: Vinnie Langton M.D.   On: 06/05/2016 09:22     Assessment & Plan:   COPD exacerbation ([place]) Recent exacerbation now resolving   Plan  Patient Instructions  Continue on Advair and Spiriva .  Continue on o2 2l/m .  Continue on Mucinex Twice daily   Continue on Flutter valve.  follow up Dr. Lake Bells in 2 weeks as planned and As needed   Please contact office for sooner follow up if symptoms do not improve or worsen or seek emergency care      Chronic respiratory failure with hypoxia ([place]) Continue on o2 .   Multiple pulmonary nodules CT shows waxing /waning nodules with some areas regressing and newly developed areas  PNA /LLL consolidation area appears to be improving  Previous ID evaluation w/ suspected indolent mold /Aspergillus ochraceus isolated and cfladosporium on BAL in 07/2015  For now she is clinically  improving from recent acute decompensation with PNA  Will need to follow with serial CT and possibly reculutre -she has had back to CT chest in last 2 weeks  Would like to wait at least 3 months -can discuss in more detail timing on next CT chest at next ov in 2 weeks with Dr. Lake Bells  Plan  . Patient Instructions  Continue on Advair and Spiriva .  Continue on o2 2l/m .  Continue on Mucinex Twice daily   Continue on Flutter valve.  follow up Dr. Lake Bells in 2 weeks as planned and As needed   Please contact office for sooner follow up if symptoms do not improve or worsen or seek emergency care       CAP (community acquired pneumonia) LLL PNA clinically improving and improved area on CT chest  No further abx at this time  Follow up in 2 weeks and As needed        Rexene Edison, NP 06/21/2016

## 2016-06-21 NOTE — Patient Instructions (Signed)
Continue on Advair and Spiriva .  Continue on o2 2l/m .  Continue on Mucinex Twice daily   Continue on Flutter valve.  follow up Dr. Lake Bells in 2 weeks as planned and As needed   Please contact office for sooner follow up if symptoms do not improve or worsen or seek emergency care

## 2016-06-24 NOTE — Progress Notes (Signed)
Reviewed.  Will see back in clinic and discuss role for further CT scanning.  At this point would favor observation alone.

## 2016-06-25 ENCOUNTER — Telehealth: Payer: Self-pay | Admitting: Family Medicine

## 2016-06-25 DIAGNOSIS — F411 Generalized anxiety disorder: Secondary | ICD-10-CM

## 2016-06-25 MED ORDER — CITALOPRAM HYDROBROMIDE 10 MG PO TABS: ORAL_TABLET | ORAL | 3 refills | Status: DC

## 2016-06-25 NOTE — Telephone Encounter (Signed)
Needs citalopram refill ( she is out) sent to [facility]  She has hospital follow up appt Thursday

## 2016-06-27 ENCOUNTER — Ambulatory Visit: Payer: Medicare Other | Admitting: Pulmonary Disease

## 2016-06-27 ENCOUNTER — Ambulatory Visit: Payer: Medicare Other | Admitting: Family Medicine

## 2016-07-01 ENCOUNTER — Encounter: Payer: Self-pay | Admitting: Family Medicine

## 2016-07-01 ENCOUNTER — Other Ambulatory Visit: Payer: Self-pay | Admitting: Family Medicine

## 2016-07-01 ENCOUNTER — Ambulatory Visit (INDEPENDENT_AMBULATORY_CARE_PROVIDER_SITE_OTHER): Payer: Medicare Other | Admitting: Family Medicine

## 2016-07-01 VITALS — BP 116/70 | HR 80 | Wt 140.0 lb

## 2016-07-01 DIAGNOSIS — J189 Pneumonia, unspecified organism: Secondary | ICD-10-CM | POA: Diagnosis not present

## 2016-07-01 DIAGNOSIS — R739 Hyperglycemia, unspecified: Secondary | ICD-10-CM | POA: Diagnosis not present

## 2016-07-01 DIAGNOSIS — E876 Hypokalemia: Secondary | ICD-10-CM | POA: Diagnosis not present

## 2016-07-01 DIAGNOSIS — J441 Chronic obstructive pulmonary disease with (acute) exacerbation: Secondary | ICD-10-CM | POA: Diagnosis not present

## 2016-07-01 DIAGNOSIS — R7309 Other abnormal glucose: Secondary | ICD-10-CM | POA: Diagnosis not present

## 2016-07-01 LAB — CBC WITH DIFFERENTIAL/PLATELET
BASOS PCT: 0 %
Basophils Absolute: 0 cells/uL (ref 0–200)
EOS ABS: 0 {cells}/uL — AB (ref 15–500)
Eosinophils Relative: 0 %
HEMATOCRIT: 41.1 % (ref 35.0–45.0)
Hemoglobin: 13.7 g/dL (ref 11.7–15.5)
LYMPHS ABS: 1891 {cells}/uL (ref 850–3900)
Lymphocytes Relative: 31 %
MCH: 32.6 pg (ref 27.0–33.0)
MCHC: 33.3 g/dL (ref 32.0–36.0)
MCV: 97.9 fL (ref 80.0–100.0)
MONO ABS: 427 {cells}/uL (ref 200–950)
MPV: 9.6 fL (ref 7.5–12.5)
Monocytes Relative: 7 %
NEUTROS ABS: 3782 {cells}/uL (ref 1500–7800)
Neutrophils Relative %: 62 %
Platelets: 219 10*3/uL (ref 140–400)
RBC: 4.2 MIL/uL (ref 3.80–5.10)
RDW: 13.8 % (ref 11.0–15.0)
WBC: 6.1 10*3/uL (ref 4.0–10.5)

## 2016-07-01 LAB — COMPREHENSIVE METABOLIC PANEL
ALK PHOS: 76 U/L (ref 33–130)
ALT: 20 U/L (ref 6–29)
AST: 20 U/L (ref 10–35)
Albumin: 4.1 g/dL (ref 3.6–5.1)
BILIRUBIN TOTAL: 0.6 mg/dL (ref 0.2–1.2)
BUN: 14 mg/dL (ref 7–25)
CALCIUM: 9.6 mg/dL (ref 8.6–10.4)
CO2: 28 mmol/L (ref 20–31)
Chloride: 104 mmol/L (ref 98–110)
Creat: 0.66 mg/dL (ref 0.60–0.93)
GLUCOSE: 137 mg/dL — AB (ref 65–99)
POTASSIUM: 4.1 mmol/L (ref 3.5–5.3)
Sodium: 143 mmol/L (ref 135–146)
TOTAL PROTEIN: 6.5 g/dL (ref 6.1–8.1)

## 2016-07-01 NOTE — Progress Notes (Signed)
   Subjective:    Patient ID: Courtney Ayers, female    DOB: 1945-06-17, 71 y.o.   MRN: [id number]  HPI She is here for recheck after recent hospitalization. She does have underlying COPD and presently is on home O2. During hospitalization she was noted to be hypokalemic as well as slightly hyperglycemic. She does need follow-up on this. She continues on site tell a cramp which has helped. She does try to keep her pulse ox 92 or better. She's had no difficulty with chest pain, shortness of breath, worsening cough, fever or chills. She does intimately complain of right hip pain but OTC meds do tend to help this.   Review of Systems     Objective:   Physical Exam Alert and in no distress. Tympanic membranes and canals are normal. Pharyngeal area is normal. Neck is supple without adenopathy or thyromegaly. Cardiac exam shows a regular sinus rhythm without murmurs or gallops. Lungs are clear to auscultation. Sounds are diminished.        Assessment & Plan:  Hypokalemia - Plan: Comprehensive metabolic panel  Hyperglycemia - Plan: Comprehensive metabolic panel  COPD exacerbation (HCC) - Plan: CBC with Differential/Platelet, Comprehensive metabolic panel  Pneumonia due to infectious organism, unspecified laterality, unspecified part of lung She will continue on her oxygen. At times she does ambulate without the oxygen. Encouraged her to use the pulse ox to determine exactly how low her numbers go into trying keep them above 92. She will follow-up with pulmonary within the next several weeks. She will keep me informed concerning the hip pain but this point no further intervention needed.

## 2016-07-03 LAB — HEMOGLOBIN A1C
Hgb A1c MFr Bld: 5.1 % (ref <5.7)
Mean Plasma Glucose: 100 mg/dL

## 2016-07-11 ENCOUNTER — Ambulatory Visit (INDEPENDENT_AMBULATORY_CARE_PROVIDER_SITE_OTHER): Payer: Medicare Other | Admitting: Pulmonary Disease

## 2016-07-11 ENCOUNTER — Encounter: Payer: Self-pay | Admitting: Pulmonary Disease

## 2016-07-11 VITALS — BP 118/82 | HR 75 | Ht 68.0 in | Wt 153.0 lb

## 2016-07-11 DIAGNOSIS — J440 Chronic obstructive pulmonary disease with acute lower respiratory infection: Secondary | ICD-10-CM | POA: Diagnosis not present

## 2016-07-11 DIAGNOSIS — J9611 Chronic respiratory failure with hypoxia: Secondary | ICD-10-CM

## 2016-07-11 DIAGNOSIS — J209 Acute bronchitis, unspecified: Secondary | ICD-10-CM | POA: Diagnosis not present

## 2016-07-11 DIAGNOSIS — R918 Other nonspecific abnormal finding of lung field: Secondary | ICD-10-CM

## 2016-07-11 NOTE — Progress Notes (Signed)
Subjective:    Patient ID: Courtney Ayers, female    DOB: 09-Jun-1946, 71 y.o.   MRN: [id number]  Synopsis: Courtney Ayers was admitted for COPD in 2016 She had a PFT on 09/2014 which showed an FEV1 of 0.81L (31% pred) CT chest in 07/2014 showed significant emphysema and a 1cm nodule PET CT 2015 showed the nodule was not FCG avid November 2016 CT chest: No change in previously seen nodules, in fact most are smaller, but there is a new 1.4 cm sub-solid nodule seen, recommend follow-up 3 months  HPI  Chief Complaint  Patient presents with  . Follow-up    f/u after CT scan. Breathing has been fine since last visit.    Emanuel has been doing OK since the last visit with Tammy earlier this month.  She says that she had a little dyspnea this morning, but in general things have been OK.  She had been generally feeling OK prior to this.  She is still not back to where she wants to be right now compared to her baseline last year.  She has a little cough with mucus production predominantly in the mornings.  She says that she gets out mucus after moving around.  She will use albuterol help get it out.  She is using albuterol a minimum of 2 times per day.  This is close to baseline.   She continues to take Spiriva and Advair.   Past Medical History:  Diagnosis Date  . Adenomatous colon polyp   . Allergic rhinitis, cause unspecified    seasonal  . COPD (chronic obstructive pulmonary disease) ([place])    no home O2  . Depressive disorder, not elsewhere classified 2006   some anxiety recurs when she has stopped med in past  . OA (osteoarthritis)    B THUMB, RIGHT > LEFT HIP, BACK  . Osteopenia      Review of Systems  Constitutional: Negative for chills, fatigue and fever.  HENT: Negative for postnasal drip, rhinorrhea and sinus pressure.   Respiratory: Positive for cough and shortness of breath. Negative for wheezing.   Cardiovascular: Negative for chest pain, palpitations and leg swelling.        Objective:   Physical Exam Vitals:   07/11/16 1013  BP: 118/82  Pulse: 75  SpO2: 97%  Weight: 153 lb (69.4 kg)  Height: 5\' 8"  (1.727 m)  RA  Gen: well appearing HENT: OP clear, TM's clear, neck supple PULM: Wheezing bilaterally  B, normal percussion CV: RRR, no mgr, trace edema GI: BS+, soft, nontender Derm: no cyanosis or rash Psyche: normal mood and affect   CBC    Component Value Date/Time   WBC 6.1 07/01/2016 1404   RBC 4.20 07/01/2016 1404   HGB 13.7 07/01/2016 1404   HCT 41.1 07/01/2016 1404   PLT 219 07/01/2016 1404   MCV 97.9 07/01/2016 1404   MCH 32.6 07/01/2016 1404   MCHC 33.3 07/01/2016 1404   RDW 13.8 07/01/2016 1404   LYMPHSABS 1,891 07/01/2016 1404   MONOABS 427 07/01/2016 1404   EOSABS 0 (L) 07/01/2016 1404   BASOSABS 0 07/01/2016 1404   Micro February 2017 [id number] ochraceus  Imaging: February 2016 CT angiogram of the chest images reviewed> there is moderate to severe emphysema bilaterally throughout both lungs, there is a 1 cm, smooth bordered, solid nodule in the right lower lobe 08/2014 PET CT 59mm RLL nodule is not hypermetabolic, no mediastinal lymph nodes 03/2015 CT chest > no change in  nodule previously seen but there are multiple new nodules that the radiologist feels may be inflammatory, rec repeat in 6-8 weeks November 2016 CT chest: No change in previously seen nodules, in fact most are smaller, but there is a new 1.4 cm sub-solid nodule seen, recommend follow-up 3 months 07/2015 CT chest > signifnicant emphysema many of the nodules previously seen have decreased in size but there are now new ones which are approximately 1.47 m in size 07/2015 Bronch: Aspergillus ochraceus isolated and cladosporium 12/2015 CT chest > improved prior nodules, new 1.5x1.7cm LUL posterior nodule 05/2016 CT chest > emphysema and left lower lobe consolidation January 2018 CT chest images personally reviewed showing severe centrilobular emphysema most  prominent in the upper lobes there is a 2.7 x 7.2 mm lesion in the left upper lobe other scattered nodules noted including a patchy area of consolidation versus nodule which is pleural-based in the left lower lobe 2.7 x 1.5 cm  Records from her hospitalization visit in December 2017 reviewed showing evidence of treatment for healthcare associated pneumonia. Pulmonary was consulted.       Assessment & Plan:   No problem-specific Assessment & Plan notes found for this encounter.   Updated Medication List Outpatient Encounter Prescriptions as of 07/11/2016  Medication Sig  . albuterol (PROVENTIL) (2.5 MG/3ML) 0.083% nebulizer solution Take 3 mLs (2.5 mg total) by nebulization every 2 (two) hours as needed for wheezing or shortness of breath.  . Calcium Carbonate-Vitamin D (CALCIUM-VITAMIN D) 500-200 MG-UNIT tablet Take 1 tablet by mouth daily.  . cetirizine (ZYRTEC) 10 MG tablet Take 10 mg by mouth daily.  . citalopram (CELEXA) 10 MG tablet take 1 tablet by mouth once daily  . fluticasone (FLONASE) 50 MCG/ACT nasal spray Place 2 sprays into both nostrils daily.  . Fluticasone-Salmeterol (ADVAIR) 250-50 MCG/DOSE AEPB Inhale 1 puff into the lungs every 12 (twelve) hours.  [name] guaiFENesin (MUCINEX) 600 MG 12 hr tablet Take 1,200 mg by mouth 2 (two) times daily.   . montelukast (SINGULAIR) 10 MG tablet Take 1 tablet (10 mg total) by mouth at bedtime.  . Multiple Vitamin (MULTIVITAMIN WITH MINERALS) TABS tablet Take 1 tablet by mouth daily.  . OXYGEN Inhale 2 L into the lungs.  [name] oxymetazoline (AFRIN) 0.05 % nasal spray Place 1 spray into both nostrils 2 (two) times daily as needed for congestion.  [name] SPIRIVA HANDIHALER 18 MCG inhalation capsule INHALE THE CONTENTS OF 1 CAPSULE ONCE DAILY USING HANDIHALER AS DIRECTED  . UNABLE TO FIND Med Name: Asthalin 139mcg - 2 puff PRN Pt gets through [place]  . vitamin C (ASCORBIC ACID) 500 MG tablet Take 500 mg by mouth daily.   No facility-administered  encounter medications on file as of 07/11/2016.

## 2016-07-11 NOTE — Patient Instructions (Signed)
We will arrange for a CT scan of your chest in July Keep taking your medicines as you're doing Stay active Use 2 L of oxygen with exertion and while sleeping We will see you back in 2 months to make sure you are headed in the right direction.

## 2016-07-11 NOTE — Assessment & Plan Note (Addendum)
Courtney Ayers has had waxing and waning nodules over the last several years which we have evaluated fairly extensively with PET scanning as well as a bronchoscopy. She ended up growing out 2 separate unusual organisms and was seen by infectious diseases who felt that they were colonization.  Now she's got yet again more waxing and waning nodules. I think the larger ones were related to her episode of pneumonia. I want to try to hold off on over scanning her but I think are going to have to do at least another CT scan of her chest in 6 months to make sure these are headed in the right direction. We talked about this at length today and she is okay with that plan.  Plan: Repeat CT chest in July 2018, I fully expect most of the nodules seen on this months CT scan to go away or reduce in size but it's very likely there will be new ones at that time.

## 2016-07-11 NOTE — Assessment & Plan Note (Signed)
She has very severe COPD but that she had a recent hospitalization for pneumonia she's doing fairly well right now. I have encouraged her to stay active. She is tolerating her Advair and Spiriva well. Her albuterol use is back to baseline.  Plan: Continue Advair twice a day Continue Spiriva daily Continue albuterol as needed (she actually uses salbutamol which she purchases from [place] [facility])

## 2016-07-11 NOTE — Assessment & Plan Note (Signed)
She will likely need oxygen for the next few months as she is recovering from her recent hospitalization.  Plan: Ambulatory oximetry monitoring today in clinic Continue 2 L daily at bedtime and with exertion after this visit

## 2016-07-18 DIAGNOSIS — D485 Neoplasm of uncertain behavior of skin: Secondary | ICD-10-CM | POA: Diagnosis not present

## 2016-07-18 DIAGNOSIS — Z87898 Personal history of other specified conditions: Secondary | ICD-10-CM | POA: Diagnosis not present

## 2016-07-18 DIAGNOSIS — Z85828 Personal history of other malignant neoplasm of skin: Secondary | ICD-10-CM | POA: Diagnosis not present

## 2016-07-18 DIAGNOSIS — L821 Other seborrheic keratosis: Secondary | ICD-10-CM | POA: Diagnosis not present

## 2016-07-18 DIAGNOSIS — Z23 Encounter for immunization: Secondary | ICD-10-CM | POA: Diagnosis not present

## 2016-07-18 DIAGNOSIS — L570 Actinic keratosis: Secondary | ICD-10-CM | POA: Diagnosis not present

## 2016-07-18 DIAGNOSIS — D225 Melanocytic nevi of trunk: Secondary | ICD-10-CM | POA: Diagnosis not present

## 2016-07-20 DIAGNOSIS — B079 Viral wart, unspecified: Secondary | ICD-10-CM | POA: Diagnosis not present

## 2016-07-25 ENCOUNTER — Encounter: Payer: Self-pay | Admitting: Internal Medicine

## 2016-08-21 ENCOUNTER — Telehealth: Payer: Self-pay | Admitting: Pulmonary Disease

## 2016-08-21 NOTE — Telephone Encounter (Signed)
Spoke with pt, states that she received a letter from her insurance stating that spiriva is not on formulary.   Covered alternatives are Anoro, Bevespi, Incruse.    BQ please advise on covered alternative.  Thanks!

## 2016-08-22 MED ORDER — UMECLIDINIUM BROMIDE 62.5 MCG/INH IN AEPB: 1.0000 | INHALATION_SPRAY | Freq: Every day | RESPIRATORY_TRACT | 5 refills | Status: DC

## 2016-08-22 NOTE — Telephone Encounter (Signed)
Spoke with the pt and notified of recs per BQ She verbalized understanding  Rx for Incruse sent to pharm

## 2016-08-22 NOTE — Telephone Encounter (Signed)
Incruse

## 2016-08-28 DIAGNOSIS — Z1231 Encounter for screening mammogram for malignant neoplasm of breast: Secondary | ICD-10-CM | POA: Diagnosis not present

## 2016-08-28 LAB — HM MAMMOGRAPHY

## 2016-09-02 ENCOUNTER — Encounter: Payer: Self-pay | Admitting: Family Medicine

## 2016-09-13 ENCOUNTER — Ambulatory Visit: Payer: Medicare Other | Admitting: Internal Medicine

## 2016-09-15 ENCOUNTER — Other Ambulatory Visit: Payer: Self-pay | Admitting: Pulmonary Disease

## 2016-09-16 ENCOUNTER — Ambulatory Visit: Payer: Medicare Other | Admitting: Pulmonary Disease

## 2016-09-17 ENCOUNTER — Encounter: Payer: Self-pay | Admitting: Pulmonary Disease

## 2016-09-17 ENCOUNTER — Ambulatory Visit (INDEPENDENT_AMBULATORY_CARE_PROVIDER_SITE_OTHER): Payer: Medicare Other | Admitting: Pulmonary Disease

## 2016-09-17 DIAGNOSIS — R918 Other nonspecific abnormal finding of lung field: Secondary | ICD-10-CM | POA: Diagnosis not present

## 2016-09-17 DIAGNOSIS — J9611 Chronic respiratory failure with hypoxia: Secondary | ICD-10-CM | POA: Diagnosis not present

## 2016-09-17 DIAGNOSIS — J3089 Other allergic rhinitis: Secondary | ICD-10-CM

## 2016-09-17 DIAGNOSIS — J209 Acute bronchitis, unspecified: Secondary | ICD-10-CM | POA: Diagnosis not present

## 2016-09-17 DIAGNOSIS — J440 Chronic obstructive pulmonary disease with acute lower respiratory infection: Secondary | ICD-10-CM | POA: Diagnosis not present

## 2016-09-17 NOTE — Assessment & Plan Note (Signed)
Worse recently Recommended flonase and cetirizine

## 2016-09-17 NOTE — Assessment & Plan Note (Signed)
Continue oxygen as prescribed for now. Will walk today on room air to see where her ambulatory oximetry is intact but I would prefer her to keep the oxygen prescription for a few more months.

## 2016-09-17 NOTE — Assessment & Plan Note (Signed)
She has had many different pulmonary nodules pop up over the years, these frequently will wax and wane.  I evaluated him with a bronchoscopy which grew out a rare aspergillus species. However, she is at high risk for malignancy considering her prior smoking history.  Plan: Follow-up July 2017 CT chest After reviewing the images in July 2017 I will discuss her situation with her thoracic radiology team to help determine a good timeline for repeated CT scans for her in the future. The best approach may be to just enroll her in the lung cancer screening program.

## 2016-09-17 NOTE — Assessment & Plan Note (Signed)
Despite her severe disease and a recent exacerbation in the wintertime she has been doing well recently.  Plan: Continue Incruise Continue Advair

## 2016-09-17 NOTE — Progress Notes (Signed)
Subjective:    Patient ID: Courtney Ayers, female    DOB: 1945-12-23, 71 y.o.   MRN: [id number]  Synopsis: Ms. Courtney Ayers was admitted for COPD in 2016 She had a PFT on 09/2014 which showed an FEV1 of 0.81L (31% pred) CT chest in 07/2014 showed significant emphysema and a 1cm nodule PET CT 2015 showed the nodule was not FCG avid November 2016 CT chest: No change in previously seen nodules, in fact most are smaller, but there is a new 1.4 cm sub-solid nodule seen, recommend follow-up 3 months  HPI  Chief Complaint  Patient presents with  . Follow-up    pt c/o increased prod cough with clear/white mucus, sinus congestion, chest tightness.  attributes this to seasonal allergies.    Courtney Ayers has been doing well with the exception of some allergic rhinitis symptoms.   She says that she has walked around a little without oxygen, but this is pretty hard.  She is using it continuously most of the time.  She has been pacing herself.  She will feel a little anxious when she gets short of breath.  She is trying to do sedentary work without oxygen. She is coughing up some mucus from time to time.  She She is wlaking further, just at a slow pace.  She has had some chest tightness associated with chest congestion.  Clearing the mucus makes this resolve.  She uses albuterol periodically, most is 4 times per day, but an average day she may use it only 1-2 times per day.   She thinks that the Incruise is working better than Spiriva.    Past Medical History:  Diagnosis Date  . Adenomatous colon polyp   . Allergic rhinitis, cause unspecified    seasonal  . COPD (chronic obstructive pulmonary disease) ([place])    no home O2  . Depressive disorder, not elsewhere classified 2006   some anxiety recurs when she has stopped med in past  . OA (osteoarthritis)    B THUMB, RIGHT > LEFT HIP, BACK  . Osteopenia      Review of Systems  Constitutional: Negative for chills, fatigue and fever.  HENT: Negative for  postnasal drip, rhinorrhea and sinus pressure.   Respiratory: Positive for cough and shortness of breath. Negative for wheezing.   Cardiovascular: Negative for chest pain, palpitations and leg swelling.       Objective:   Physical Exam Vitals:   09/17/16 1328  BP: 134/78  Pulse: 82  SpO2: 97%  Weight: 137 lb (62.1 kg)  Height: 5\' 8"  (1.727 m)  RA  Gen: well appearing HENT: OP clear, TM's clear, neck supple PULM: Poor air movement B, normal percussion CV: RRR, no mgr, trace edema GI: BS+, soft, nontender Derm: no cyanosis or rash Psyche: normal mood and affect    CBC    Component Value Date/Time   WBC 6.1 07/01/2016 1404   RBC 4.20 07/01/2016 1404   HGB 13.7 07/01/2016 1404   HCT 41.1 07/01/2016 1404   PLT 219 07/01/2016 1404   MCV 97.9 07/01/2016 1404   MCH 32.6 07/01/2016 1404   MCHC 33.3 07/01/2016 1404   RDW 13.8 07/01/2016 1404   LYMPHSABS 1,891 07/01/2016 1404   MONOABS 427 07/01/2016 1404   EOSABS 0 (L) 07/01/2016 1404   BASOSABS 0 07/01/2016 1404   Micro February 2017 EHM:[id number] ochraceus 07/2015 Bronch: Aspergillus ochraceus isolated and cladosporium  Imaging: February 2016 CT angiogram of the chest images reviewed> there is moderate to severe  emphysema bilaterally throughout both lungs, there is a 1 cm, smooth bordered, solid nodule in the right lower lobe 08/2014 PET CT 21mm RLL nodule is not hypermetabolic, no mediastinal lymph nodes 03/2015 CT chest > no change in nodule previously seen but there are multiple new nodules that the radiologist feels may be inflammatory, rec repeat in 6-8 weeks November 2016 CT chest: No change in previously seen nodules, in fact most are smaller, but there is a new 1.4 cm sub-solid nodule seen, recommend follow-up 3 months 07/2015 CT chest > signifnicant emphysema many of the nodules previously seen have decreased in size but there are now new ones which are approximately 1.47 m in size 12/2015 CT chest > improved  prior nodules, new 1.5x1.7cm LUL posterior nodule 05/2016 CT chest > emphysema and left lower lobe consolidation January 2018 CT chest images personally reviewed showing severe centrilobular emphysema most prominent in the upper lobes there is a 2.7 x 7.2 mm lesion in the left upper lobe other scattered nodules noted including a patchy area of consolidation versus nodule which is pleural-based in the left lower lobe 2.7 x 1.5 cm      Assessment & Plan:   Multiple pulmonary nodules She has had many different pulmonary nodules pop up over the years, these frequently will wax and wane.  I evaluated him with a bronchoscopy which grew out a rare aspergillus species. However, she is at high risk for malignancy considering her prior smoking history.  Plan: Follow-up July 2017 CT chest After reviewing the images in July 2017 I will discuss her situation with her thoracic radiology team to help determine a good timeline for repeated CT scans for her in the future. The best approach may be to just enroll her in the lung cancer screening program.  COPD (chronic obstructive pulmonary disease) with acute bronchitis ([place]) Despite her severe disease and a recent exacerbation in the wintertime she has been doing well recently.  Plan: Continue Incruise Continue Advair  Chronic respiratory failure with hypoxia ([facility]) Continue oxygen as prescribed for now. Will walk today on room air to see where her ambulatory oximetry is intact but I would prefer her to keep the oxygen prescription for a few more months.  Allergic rhinitis Worse recently Recommended flonase and cetirizine   Updated Medication List Outpatient Encounter Prescriptions as of 09/17/2016  Medication Sig  . ADVAIR DISKUS 250-50 MCG/DOSE AEPB INHALE 1 PUFF INTO THE LUNGS EVERY 12 HOURS  . albuterol (PROVENTIL) (2.5 MG/3ML) 0.083% nebulizer solution Take 3 mLs (2.5 mg total) by nebulization every 2 (two) hours as needed for wheezing or  shortness of breath.  . Calcium Carbonate-Vitamin D (CALCIUM-VITAMIN D) 500-200 MG-UNIT tablet Take 1 tablet by mouth daily.  . cetirizine (ZYRTEC) 10 MG tablet Take 10 mg by mouth daily.  . citalopram (CELEXA) 10 MG tablet take 1 tablet by mouth once daily  . fluticasone (FLONASE) 50 MCG/ACT nasal spray Place 2 sprays into both nostrils daily.  [name] guaiFENesin (MUCINEX) 600 MG 12 hr tablet Take 1,200 mg by mouth 2 (two) times daily.   . montelukast (SINGULAIR) 10 MG tablet Take 1 tablet (10 mg total) by mouth at bedtime.  . Multiple Vitamin (MULTIVITAMIN WITH MINERALS) TABS tablet Take 1 tablet by mouth daily.  . OXYGEN Inhale 2 L into the lungs.  [name] oxymetazoline (AFRIN) 0.05 % nasal spray Place 1 spray into both nostrils 2 (two) times daily as needed for congestion.  [name] umeclidinium bromide (INCRUSE ELLIPTA) 62.5 MCG/INH AEPB  Inhale 1 puff into the lungs daily.  [name] UNABLE TO FIND Med Name: Asthalin 14mcg - 2 puff PRN Pt gets through [facility]  . vitamin C (ASCORBIC ACID) 500 MG tablet Take 500 mg by mouth daily.   No facility-administered encounter medications on file as of 09/17/2016.

## 2016-09-17 NOTE — Patient Instructions (Signed)
My favorite allergy regimen is: Use Neil Med rinses with distilled water at least twice per day using the instructions on the package. 1/2 hour after using the [facility] Med rinse, use Nasacort two puffs in each nostril once per day.  Remember that the Nasacort can take 1-2 weeks to work after regular use. Use generic zyrtec (cetirizine) every day.  If this doesn't help, then stop taking it and use chlorpheniramine-phenylephrine combination tablets.  Keep taking your inhaled medicines as you are doing  Keep using your oxygen as you are doing  We will see you after the CT scan in July

## 2016-09-26 ENCOUNTER — Telehealth: Payer: Self-pay | Admitting: Internal Medicine

## 2016-09-26 NOTE — Telephone Encounter (Signed)
The patient, she had been on my schedule the other week but I had to cancel because of a Hospital case that needed to be done. She was coming in for follow-up about 5 years after a colonoscopy showing a diminutive adenomatous polyp. She has had a history of polyps over the years. She has severe COPD on oxygen therapy at this point and her FEV1 is less than 1. I explained to her that I thought that preventative colonoscopy was not in her best interest at this point, and that we would evaluate on an as needed signs and symptoms dependent basis. She understood the plan.

## 2016-10-01 DIAGNOSIS — H2513 Age-related nuclear cataract, bilateral: Secondary | ICD-10-CM | POA: Diagnosis not present

## 2016-10-07 ENCOUNTER — Telehealth: Payer: Self-pay | Admitting: Pulmonary Disease

## 2016-10-07 NOTE — Telephone Encounter (Signed)
Patient returned phone call.. Me ° °

## 2016-10-07 NOTE — Telephone Encounter (Signed)
lmomtcb x 1 for the pt 

## 2016-10-07 NOTE — Telephone Encounter (Signed)
lmtcb X2 for pt.  

## 2016-10-08 NOTE — Telephone Encounter (Signed)
lmtcb for pt.  

## 2016-10-08 NOTE — Telephone Encounter (Signed)
lmomtcb x 2  

## 2016-10-08 NOTE — Telephone Encounter (Signed)
Pt returning call.Courtney Ayers ° °

## 2016-10-08 NOTE — Telephone Encounter (Signed)
Patient returned phone call.Courtney Ayers ° °

## 2016-10-09 MED ORDER — ALBUTEROL SULFATE HFA 108 (90 BASE) MCG/ACT IN AERS: 2.0000 | INHALATION_SPRAY | Freq: Four times a day (QID) | RESPIRATORY_TRACT | 11 refills | Status: DC | PRN

## 2016-10-09 NOTE — Telephone Encounter (Signed)
lmomtcb x 3 for pt 

## 2016-10-09 NOTE — Telephone Encounter (Signed)
Pt called back, requesting rx for rescue inhaler to be sent to [facility].  rx printed, signed, and faxed to below verified fax #.  Nothing further needed.

## 2016-11-12 ENCOUNTER — Ambulatory Visit (INDEPENDENT_AMBULATORY_CARE_PROVIDER_SITE_OTHER): Payer: Medicare Other | Admitting: Family Medicine

## 2016-11-12 ENCOUNTER — Encounter: Payer: Self-pay | Admitting: Family Medicine

## 2016-11-12 VITALS — BP 120/78 | HR 83 | Ht 68.0 in | Wt 136.0 lb

## 2016-11-12 DIAGNOSIS — J449 Chronic obstructive pulmonary disease, unspecified: Secondary | ICD-10-CM | POA: Diagnosis not present

## 2016-11-12 DIAGNOSIS — J301 Allergic rhinitis due to pollen: Secondary | ICD-10-CM

## 2016-11-12 DIAGNOSIS — F411 Generalized anxiety disorder: Secondary | ICD-10-CM | POA: Diagnosis not present

## 2016-11-12 DIAGNOSIS — J9611 Chronic respiratory failure with hypoxia: Secondary | ICD-10-CM | POA: Diagnosis not present

## 2016-11-12 MED ORDER — CITALOPRAM HYDROBROMIDE 10 MG PO TABS: ORAL_TABLET | ORAL | 3 refills | Status: DC

## 2016-11-12 NOTE — Progress Notes (Signed)
Subjective:   HPI  Courtney Ayers is a 71 y.o. female who presents for Chief Complaint  Patient presents with  . Medicare Wellness    med check plus    Medical care team includes: [name], MD here for primary care   [facility]. [facility]     Preventative care:  Last ophthalmology visit:  10/2016 Last dental visit: 11/2016 Last colonoscopy: 09/08/11 Last mammogram:08/28/16 Last pap: N/A Last EKG:06/07/16 Last labs:1/ 22/18  Prior vaccinations: TD or Tdap:01/30/11 Influenza: 03/21/16 Pneumococcal:23: 01/30/11,08/06/00 13: 03/24/14 Shingles/Zostavax: 01/30/11.Encouraged her to get the Shingrix vaccine. Advanced directive: Yes. Asked to do bring in a copy.   Concerns: No particular concerns or complaints. She continues on O2 therapy at home. She does desaturate with physical activity. She does wear the nasal cannula at night as well. He continues on Advair and does use albuterol for rescue. Her underlying allergies seem to be under good control. She is taking Zyrtec as well as Singulair.. She has been on Celexa for quite some time. She was originally placed on Calc deal with her underlying pulmonary disease. At this point she is comfortable with that dosing and not interested in stopping it. She has a history of colonic polyps however note from GI shows a reluctance to follow-up on that due to her underlying pulmonary disease.  Reviewed their medical, surgical, family, social, medication, and allergy history and updated chart as appropriate.  Past Medical History:  Diagnosis Date  . Adenomatous colon polyp   . Allergic rhinitis, cause unspecified    seasonal  . COPD (chronic obstructive pulmonary disease) ([place])    no home O2  . Depressive disorder, not elsewhere classified 2006   some anxiety recurs when she has stopped med in past  . OA (osteoarthritis)    B THUMB, RIGHT > LEFT HIP, BACK  . Osteopenia     Past Surgical History:  Procedure Laterality Date  .  BREAST ENHANCEMENT SURGERY  1978   implants placed '78, removed and replaced  approx '95 (Dr. Wendy Poet)  . COLONOSCOPY  08/2011  . COSMETIC SURGERY     LIPOSUCTION; other facial cosmetic surgery  . GANGLION CYST EXCISION    . [place] HERNIA REPAIR  2004   right, and ganglion cyst removal  . LIPOMA EXCISION    . MELANOMA EXCISION  2013   R upper arm  . NASAL SEPTUM SURGERY  1987   for deviated septum (Dr. Wendy Poet)  . TONSILLECTOMY AND ADENOIDECTOMY  1951  . VIDEO BRONCHOSCOPY Bilateral 08/08/2015   Procedure: VIDEO BRONCHOSCOPY WITHOUT FLUORO;  Surgeon: Juanito Doom, MD;  Location: [facility] ENDOSCOPY;  Service: Cardiopulmonary;  Laterality: Bilateral;    Social History   Social History  . Marital status: Single    Spouse name: N/A  . Number of children: N/A  . Years of education: N/A   Occupational History  . Not on file.   Social History Main Topics  . Smoking status: Former Smoker    Packs/day: 1.00    Years: 45.00    Types: Cigarettes    Quit date: 08/05/2014  . Smokeless tobacco: Never Used  . Alcohol use 0.0 oz/week     Comment: 2 drinks per night  . Drug use: No  . Sexual activity: Not Currently    Partners: Female   Other Topics Concern  . Not on file   Social History Narrative   Lives alone, 1 cat, 1 dog, 2 lovebirds, 4 calico fantail goldfish  Family History  Problem Relation Age of Onset  . Cancer Mother        bladder, metastatic  . Alcohol abuse Father   . Heart disease Father        CHF  . Hyperlipidemia Father   . Hypertension Father   . Aortic aneurysm Sister   . Crohn's disease Sister   . Hyperlipidemia Sister   . Cancer Sister        brain cancer in her 87's  . Diabetes Maternal Uncle   . Cancer Maternal Grandmother 98       pancreatic  . Cancer Maternal Grandfather        bladder cancer in 2's  . Stroke Paternal Grandmother   . Heart disease Paternal Grandfather        MI later 25's, early 10's     Current Outpatient  Prescriptions:  .  ADVAIR DISKUS 250-50 MCG/DOSE AEPB, INHALE 1 PUFF INTO THE LUNGS EVERY 12 HOURS, Disp: 60 each, Rfl: 5 .  albuterol (PROVENTIL HFA;VENTOLIN HFA) 108 (90 Base) MCG/ACT inhaler, Inhale 2 puffs into the lungs every 6 (six) hours as needed for wheezing or shortness of breath., Disp: 1 Inhaler, Rfl: 11 .  albuterol (PROVENTIL) (2.5 MG/3ML) 0.083% nebulizer solution, Take 3 mLs (2.5 mg total) by nebulization every 2 (two) hours as needed for wheezing or shortness of breath., Disp: 75 mL, Rfl: 12 .  Calcium Carbonate-Vitamin D (CALCIUM-VITAMIN D) 500-200 MG-UNIT tablet, Take 1 tablet by mouth daily., Disp: , Rfl:  .  cetirizine (ZYRTEC) 10 MG tablet, Take 10 mg by mouth daily., Disp: , Rfl:  .  citalopram (CELEXA) 10 MG tablet, take 1 tablet by mouth once daily, Disp: 90 tablet, Rfl: 3 .  fluticasone (FLONASE) 50 MCG/ACT nasal spray, Place 2 sprays into both nostrils daily., Disp: 16 g, Rfl: 5 .  guaiFENesin (MUCINEX) 600 MG 12 hr tablet, Take 1,200 mg by mouth 2 (two) times daily. , Disp: , Rfl:  .  montelukast (SINGULAIR) 10 MG tablet, Take 1 tablet (10 mg total) by mouth at bedtime., Disp: 30 tablet, Rfl: 11 .  Multiple Vitamin (MULTIVITAMIN WITH MINERALS) TABS tablet, Take 1 tablet by mouth daily., Disp: , Rfl:  .  OXYGEN, Inhale 2 L into the lungs., Disp: , Rfl:  .  oxymetazoline (AFRIN) 0.05 % nasal spray, Place 1 spray into both nostrils 2 (two) times daily as needed for congestion., Disp: , Rfl:  .  umeclidinium bromide (INCRUSE ELLIPTA) 62.5 MCG/INH AEPB, Inhale 1 puff into the lungs daily., Disp: 30 each, Rfl: 5 .  UNABLE TO FIND, Med Name: Asthalin 173mcg - 2 puff PRN Pt gets through [facility], Disp: , Rfl:  .  vitamin C (ASCORBIC ACID) 500 MG tablet, Take 500 mg by mouth daily., Disp: , Rfl:   No Known Allergies     Review of Systems Negative except as above   Objective:   General appearance: alert, no distress, WD/WN, Caucasian female Skin: Negative except for  abrasion on the right shin HEENT: normocephalic, conjunctiva/corneas normal, sclerae anicteric, PERRLA, EOMi, nares patent, no discharge or erythema, pharynx normal Oral cavity: MMM, tongue normal, teeth normal Neck: supple, no lymphadenopathy, no thyromegaly, no masses, normal ROM, no bruits Heart: RRR, normal S1, S2, no murmurs Lungs: CTA bilaterally, no wheezes, rhonchi, or rales  Musculoskeletal: upper extremities non tender, no obvious deformity, normal ROM throughout, lower extremities non tender, no obvious deformity, normal ROM throughout Extremities: no edema, no cyanosis, no clubbing Pulses: 2+ symmetric,  upper and lower extremities, normal cap refill Neurological: alert, oriented x 3, CN2-12 intact, strength normal upper extremities and lower extremities, sensation normal throughout, DTRs 2+ throughout, no cerebellar signs, gait normal Psychiatric: normal affect, behavior normal, pleasant    Assessment and Plan :    Chronic obstructive pulmonary disease, unspecified COPD type ([place])  Non-seasonal allergic rhinitis due to pollen  Chronic respiratory failure with hypoxia ([place])  Anxiety state - Plan: citalopram (CELEXA) 10 MG tablet   Physical exam - discussed and counseled on healthy lifestyle, diet, exercise, preventative care, vaccinations, sick and well care, proper use of emergency dept and after hours care, and addressed their concerns.   Overall she seems be very stable on the above diagnoses. Discussed getting the Shingrix vaccine. Will also continue her on Celexa as at this point she is not ready to stop the medication.

## 2016-11-12 NOTE — Patient Instructions (Signed)
  Courtney Ayers , Thank you for taking time to come for your Medicare Wellness Visit. I appreciate your ongoing commitment to your health goals. Please review the following plan we discussed and let me know if I can assist you in the future.   These are the goals we discussed: Goals    None      This is a list of the screening recommended for you and due dates:  Health Maintenance  Topic Date Due  . Colon Cancer Screening  08/28/2016  . Flu Shot  01/08/2017  . Mammogram  08/29/2018  . Tetanus Vaccine  01/29/2021  . DEXA scan (bone density measurement)  Completed  .  Hepatitis C: One time screening is recommended by Center for Disease Control  (CDC) for  adults born from [birth year] through 1966.   Completed  . Pneumonia vaccines  Completed

## 2016-12-02 ENCOUNTER — Telehealth: Payer: Self-pay | Admitting: Pulmonary Disease

## 2016-12-02 MED ORDER — MONTELUKAST SODIUM 10 MG PO TABS: 10.0000 mg | ORAL_TABLET | Freq: Every day | ORAL | 3 refills | Status: DC

## 2016-12-02 MED ORDER — FLUTICASONE-SALMETEROL 250-50 MCG/DOSE IN AEPB: INHALATION_SPRAY | RESPIRATORY_TRACT | 3 refills | Status: DC

## 2016-12-02 NOTE — Telephone Encounter (Signed)
Pt requesting a 90 day supply of singulair and advair to be sent to pharmacy.  This has been sent.  Nothing further needed.

## 2016-12-16 ENCOUNTER — Ambulatory Visit (INDEPENDENT_AMBULATORY_CARE_PROVIDER_SITE_OTHER)
Admission: RE | Admit: 2016-12-16 | Discharge: 2016-12-16 | Disposition: A | Discharge status: Home / Self Care | Payer: Medicare Other | Source: Ambulatory Visit | Attending: Pulmonary Disease | Admitting: Pulmonary Disease

## 2016-12-16 DIAGNOSIS — R918 Other nonspecific abnormal finding of lung field: Secondary | ICD-10-CM

## 2016-12-17 ENCOUNTER — Other Ambulatory Visit: Payer: Self-pay

## 2016-12-17 DIAGNOSIS — R918 Other nonspecific abnormal finding of lung field: Secondary | ICD-10-CM

## 2016-12-30 ENCOUNTER — Ambulatory Visit (INDEPENDENT_AMBULATORY_CARE_PROVIDER_SITE_OTHER): Payer: Medicare Other | Admitting: Pulmonary Disease

## 2016-12-30 ENCOUNTER — Encounter: Payer: Self-pay | Admitting: Pulmonary Disease

## 2016-12-30 VITALS — BP 126/68 | HR 82 | Ht 68.0 in | Wt 132.8 lb

## 2016-12-30 DIAGNOSIS — R918 Other nonspecific abnormal finding of lung field: Secondary | ICD-10-CM | POA: Diagnosis not present

## 2016-12-30 DIAGNOSIS — J440 Chronic obstructive pulmonary disease with acute lower respiratory infection: Secondary | ICD-10-CM

## 2016-12-30 DIAGNOSIS — J209 Acute bronchitis, unspecified: Secondary | ICD-10-CM | POA: Diagnosis not present

## 2016-12-30 DIAGNOSIS — J9611 Chronic respiratory failure with hypoxia: Secondary | ICD-10-CM | POA: Diagnosis not present

## 2016-12-30 MED ORDER — SODIUM CHLORIDE 3 % IN NEBU: INHALATION_SOLUTION | Freq: Two times a day (BID) | RESPIRATORY_TRACT | 11 refills | Status: DC | PRN

## 2016-12-30 NOTE — Progress Notes (Signed)
Subjective:    Patient ID: Courtney Ayers, female    DOB: 1946/04/23, 71 y.o.   MRN: [id number]  Synopsis: Courtney Ayers was admitted for COPD in 2016 She had a PFT on 09/2014 which showed an FEV1 of 0.81L (31% pred) CT chest in 07/2014 showed significant emphysema and a 1cm nodule PET CT 2015 showed the nodule was not FCG avid November 2016 CT chest: No change in previously seen nodules, in fact most are smaller, but there is a new 1.4 cm sub-solid nodule seen, recommend follow-up 3 months  HPI  Chief Complaint  Patient presents with  . Follow-up    pt c/o stuffy nose, prod cough with clear mucus.     Courtney Ayers is feelin OK.  She is taking guaifenesin regulalry 1200mg  twice a day.  She wants to know if it can lose its effectiveness if she takes to for too long.  She still has some chest tightness in the mornings and she feels like she is struggling more lately to get the mucus up.  If she drinks enough water it helps.  She feels like the biggest problem in her lungs is the chest congestion.  Otherwise she is doing well.  She continues to ues her oxygen regularly and benefit from it.  Past Medical History:  Diagnosis Date  . Adenomatous colon polyp   . Allergic rhinitis, cause unspecified    seasonal  . COPD (chronic obstructive pulmonary disease) ([place])    no home O2  . Depressive disorder, not elsewhere classified 2006   some anxiety recurs when she has stopped med in past  . OA (osteoarthritis)    B THUMB, RIGHT > LEFT HIP, BACK  . Osteopenia      Review of Systems  Constitutional: Negative for chills, fatigue and fever.  HENT: Negative for postnasal drip, rhinorrhea and sinus pressure.   Respiratory: Positive for cough and shortness of breath. Negative for wheezing.   Cardiovascular: Negative for chest pain, palpitations and leg swelling.       Objective:   Physical Exam Vitals:   12/30/16 1107  BP: 126/68  Pulse: 82  SpO2: 98%  Weight: 132 lb 12.8 oz (60.2 kg)    Height: 5\' 8"  (1.727 m)  RA  Gen: well appearing HENT: OP clear, TM's clear, neck supple PULM: Poor air movement no wheezing  B, normal percussion CV: RRR, no mgr, trace edema GI: BS+, soft, nontender Derm: chronic cyanotic type changes feet with livedo legs Psyche: normal mood and affect     CBC    Component Value Date/Time   WBC 6.1 07/01/2016 1404   RBC 4.20 07/01/2016 1404   HGB 13.7 07/01/2016 1404   HCT 41.1 07/01/2016 1404   PLT 219 07/01/2016 1404   MCV 97.9 07/01/2016 1404   MCH 32.6 07/01/2016 1404   MCHC 33.3 07/01/2016 1404   RDW 13.8 07/01/2016 1404   LYMPHSABS 1,891 07/01/2016 1404   MONOABS 427 07/01/2016 1404   EOSABS 0 (L) 07/01/2016 1404   BASOSABS 0 07/01/2016 1404   Micro February 2017 SNK:[id number] ochraceus 07/2015 Bronch: Aspergillus ochraceus isolated and cladosporium  Imaging: February 2016 CT angiogram of the chest images reviewed> there is moderate to severe emphysema bilaterally throughout both lungs, there is a 1 cm, smooth bordered, solid nodule in the right lower lobe 08/2014 PET CT 63mm RLL nodule is not hypermetabolic, no mediastinal lymph nodes 03/2015 CT chest > no change in nodule previously seen but there are multiple new nodules  that the radiologist feels may be inflammatory, rec repeat in 6-8 weeks November 2016 CT chest: No change in previously seen nodules, in fact most are smaller, but there is a new 1.4 cm sub-solid nodule seen, recommend follow-up 3 months 07/2015 CT chest > signifnicant emphysema many of the nodules previously seen have decreased in size but there are now new ones which are approximately 1.47 m in size 12/2015 CT chest > improved prior nodules, new 1.5x1.7cm LUL posterior nodule 05/2016 CT chest > emphysema and left lower lobe consolidation January 2018 CT chest images personally reviewed showing severe centrilobular emphysema most prominent in the upper lobes there is a 2.7 x 7.2 mm lesion in the left upper lobe  other scattered nodules noted including a patchy area of consolidation versus nodule which is pleural-based in the left lower lobe 2.7 x 1.5 cm July 2018 CT chest images personally reviewed: new spiculated 2.2 cm RUL nodule, other nodules resolved.     Assessment & Plan:   Multiple pulmonary nodules  COPD (chronic obstructive pulmonary disease) with acute bronchitis (HCC)  Chronic respiratory failure with hypoxia ([place])  Discussion: Tila has not had an exacerbation of her COPD since the last visit. She does have very severe COPD and very severe centrilobular emphysema but despite this she's been doing fairly well with Advair and increase. She continues to use and benefit from her chronic oxygen therapy.  In regards to her pulmonary nodules, this is been an ongoing problem for several years. Every time we get a CT scan the more worrisome nodule from the previous study goes away within the something new that shown up. I had a lengthy discussion with her thoracic radiology partners about this and explained to them that over the last several years have performed a bronchoscopy, found an abnormal mold, refer to ID, but despite all this would never been approved why she has the waxing and waning nodules. We think the best approach is to perform annual low-dose CT scans in order to minimize her radiation exposure but continue to monitor her for lung cancer.  Plan: For your chest congestion: Stop taking guaifenesin Use hypertonic saline nebulizer twice a day  For your COPD: Keep taking your Advair and Incruise as you are doing Get a flu shot in the fall  For your pulmonary nodule: We wil get another CT in 2 months and then annually after that  For your chronic respiratory failure with hypoxemia: Keep using oxygen at 2L PM as you are doing  We will see you back in 4-6 months or sooner if needed  Updated Medication List Outpatient Encounter Prescriptions as of 12/30/2016  Medication Sig    . albuterol (PROVENTIL HFA;VENTOLIN HFA) 108 (90 Base) MCG/ACT inhaler Inhale 2 puffs into the lungs every 6 (six) hours as needed for wheezing or shortness of breath.  [name] albuterol (PROVENTIL) (2.5 MG/3ML) 0.083% nebulizer solution Take 3 mLs (2.5 mg total) by nebulization every 2 (two) hours as needed for wheezing or shortness of breath.  . Calcium Carbonate-Vitamin D (CALCIUM-VITAMIN D) 500-200 MG-UNIT tablet Take 1 tablet by mouth daily.  . cetirizine (ZYRTEC) 10 MG tablet Take 10 mg by mouth daily.  . citalopram (CELEXA) 10 MG tablet take 1 tablet by mouth once daily  . fluticasone (FLONASE) 50 MCG/ACT nasal spray Place 2 sprays into both nostrils daily.  . Fluticasone-Salmeterol (ADVAIR DISKUS) 250-50 MCG/DOSE AEPB INHALE 1 PUFF INTO THE LUNGS EVERY 12 HOURS  . guaiFENesin (MUCINEX) 600 MG 12 hr tablet  Take 1,200 mg by mouth 2 (two) times daily.   . montelukast (SINGULAIR) 10 MG tablet Take 1 tablet (10 mg total) by mouth at bedtime.  . Multiple Vitamin (MULTIVITAMIN WITH MINERALS) TABS tablet Take 1 tablet by mouth daily.  . OXYGEN Inhale 2 L into the lungs.  [name] oxymetazoline (AFRIN) 0.05 % nasal spray Place 1 spray into both nostrils 2 (two) times daily as needed for congestion.  [name] umeclidinium bromide (INCRUSE ELLIPTA) 62.5 MCG/INH AEPB Inhale 1 puff into the lungs daily.  [name] UNABLE TO FIND Med Name: Asthalin 124mcg - 2 puff PRN Pt gets through [facility]  . vitamin C (ASCORBIC ACID) 500 MG tablet Take 500 mg by mouth daily.  . sodium chloride HYPERTONIC 3 % nebulizer solution Take by nebulization 2 (two) times daily as needed for other.   No facility-administered encounter medications on file as of 12/30/2016.

## 2016-12-30 NOTE — Patient Instructions (Addendum)
For your chest congestion: Stop taking guaifenesin Use hypertonic saline nebulizer twice a day  For your COPD: Keep taking your Advair and Incruise as you are doing Get a flu shot in the fall  For your pulmonary nodule: We wil get another CT in 2 months and then annually after that  For your chronic respiratory failure with hypoxemia: Keep using oxygen at 2L PM as you are doing  We will see you back in 4-6 months or sooner if needed

## 2016-12-30 NOTE — Addendum Note (Signed)
Addended by: Len Blalock on: 12/30/2016 11:58 AM   Modules accepted: Orders

## 2017-02-18 ENCOUNTER — Ambulatory Visit (INDEPENDENT_AMBULATORY_CARE_PROVIDER_SITE_OTHER)
Admission: RE | Admit: 2017-02-18 | Discharge: 2017-02-18 | Disposition: A | Discharge status: Home / Self Care | Payer: Medicare Other | Source: Ambulatory Visit | Attending: Pulmonary Disease | Admitting: Pulmonary Disease

## 2017-02-18 DIAGNOSIS — R918 Other nonspecific abnormal finding of lung field: Secondary | ICD-10-CM | POA: Diagnosis not present

## 2017-02-21 ENCOUNTER — Other Ambulatory Visit: Payer: Self-pay | Admitting: Pulmonary Disease

## 2017-04-02 ENCOUNTER — Telehealth: Payer: Self-pay | Admitting: Pulmonary Disease

## 2017-04-02 MED ORDER — ALBUTEROL SULFATE HFA 108 (90 BASE) MCG/ACT IN AERS: 2.0000 | INHALATION_SPRAY | Freq: Four times a day (QID) | RESPIRATORY_TRACT | 0 refills | Status: DC | PRN

## 2017-04-02 NOTE — Telephone Encounter (Signed)
Spoke with pt and verified information. Sent an RX to [facility] by fax. Nothing further is needed.

## 2017-05-07 ENCOUNTER — Encounter: Payer: Self-pay | Admitting: Pulmonary Disease

## 2017-05-07 ENCOUNTER — Ambulatory Visit (INDEPENDENT_AMBULATORY_CARE_PROVIDER_SITE_OTHER): Payer: Medicare Other | Admitting: Pulmonary Disease

## 2017-05-07 VITALS — BP 128/66 | HR 73 | Ht 68.0 in | Wt 130.0 lb

## 2017-05-07 DIAGNOSIS — Z23 Encounter for immunization: Secondary | ICD-10-CM

## 2017-05-07 DIAGNOSIS — J209 Acute bronchitis, unspecified: Secondary | ICD-10-CM | POA: Diagnosis not present

## 2017-05-07 DIAGNOSIS — J440 Chronic obstructive pulmonary disease with acute lower respiratory infection: Secondary | ICD-10-CM

## 2017-05-07 DIAGNOSIS — J9611 Chronic respiratory failure with hypoxia: Secondary | ICD-10-CM

## 2017-05-07 DIAGNOSIS — R918 Other nonspecific abnormal finding of lung field: Secondary | ICD-10-CM | POA: Diagnosis not present

## 2017-05-07 NOTE — Patient Instructions (Signed)
COPD: Continue taking Advair and Incruise Flu shot today Stay active  Chronic respiratory failure with hypoxemia: We will measure your oxygen while you are at rest and walking today on room air Keep using 2 L of oxygen when you exert yourself and when you sleep  Pulmonary nodules: We will plan on repeating a CT scan in July 2019  Follow-up with me in 6 months or sooner if needed

## 2017-05-07 NOTE — Progress Notes (Signed)
Subjective:    Patient ID: Courtney Ayers, female    DOB: 1945/11/18, 71 y.o.   MRN: [id number]  Synopsis: Ms. Lesli Albee was admitted for COPD in 2016 She had a PFT on 09/2014 which showed an FEV1 of 0.81L (31% pred) CT chest in 07/2014 showed significant emphysema and a 1cm nodule PET CT 2015 showed the nodule was not FCG avid November 2016 CT chest: No change in previously seen nodules, in fact most are smaller, but there is a new 1.4 cm sub-solid nodule seen, recommend follow-up 3 months  HPI  Chief Complaint  Patient presents with  . Follow-up    pt states she is doing well, does note dyspnea with exertion- stable.    Tariah and her sister have been dealing with a friend's sickness lately.   They have been in and out of the hospital a lot due to this.   COPD: > she hasn't noticed any new breathing difficulty > she has been using her flutter valve and hypertonic saline twice a day > she produces in the mornings, no blood > Still on Advair and Incruise > no recent bronchitis or pneumonia.  Chronic respiratory failure with hypoxemia: > only using it on exertion.   > not using it at rest  She has been active lately and reingihsed her bedroom and bathroom.    Past Medical History:  Diagnosis Date  . Adenomatous colon polyp   . Allergic rhinitis, cause unspecified    seasonal  . COPD (chronic obstructive pulmonary disease) ([place])    no home O2  . Depressive disorder, not elsewhere classified 2006   some anxiety recurs when she has stopped med in past  . OA (osteoarthritis)    B THUMB, RIGHT > LEFT HIP, BACK  . Osteopenia      Review of Systems  Constitutional: Negative for chills, fatigue and fever.  HENT: Negative for postnasal drip, rhinorrhea and sinus pressure.   Respiratory: Positive for cough and shortness of breath. Negative for wheezing.   Cardiovascular: Negative for chest pain, palpitations and leg swelling.       Objective:   Physical Exam Vitals:     05/07/17 1350  BP: 128/66  Pulse: 73  SpO2: 94%  Weight: 130 lb (59 kg)  Height: 5\' 8"  (1.727 m)  RA  Gen: chronically ill but well appearing HENT: OP clear, TM's clear, neck supple PULM: Poor air movement, no wheezing B, normal percussion CV: RRR, no mgr, trace edema GI: BS+, soft, nontender Derm: cyanosis fingers, no clubbing, no rash Psyche: normal mood and affect      CBC    Component Value Date/Time   WBC 6.1 07/01/2016 1404   RBC 4.20 07/01/2016 1404   HGB 13.7 07/01/2016 1404   HCT 41.1 07/01/2016 1404   PLT 219 07/01/2016 1404   MCV 97.9 07/01/2016 1404   MCH 32.6 07/01/2016 1404   MCHC 33.3 07/01/2016 1404   RDW 13.8 07/01/2016 1404   LYMPHSABS 1,891 07/01/2016 1404   MONOABS 427 07/01/2016 1404   EOSABS 0 (L) 07/01/2016 1404   BASOSABS 0 07/01/2016 1404   Micro February 2017 IEP:[id number] ochraceus 07/2015 Bronch: Aspergillus ochraceus isolated and cladosporium  Imaging: February 2016 CT angiogram of the chest images reviewed> there is moderate to severe emphysema bilaterally throughout both lungs, there is a 1 cm, smooth bordered, solid nodule in the right lower lobe 08/2014 PET CT 41mm RLL nodule is not hypermetabolic, no mediastinal lymph nodes 03/2015 CT chest >  no change in nodule previously seen but there are multiple new nodules that the radiologist feels may be inflammatory, rec repeat in 6-8 weeks November 2016 CT chest: No change in previously seen nodules, in fact most are smaller, but there is a new 1.4 cm sub-solid nodule seen, recommend follow-up 3 months 07/2015 CT chest > signifnicant emphysema many of the nodules previously seen have decreased in size but there are now new ones which are approximately 1.47 m in size 12/2015 CT chest > improved prior nodules, new 1.5x1.7cm LUL posterior nodule 05/2016 CT chest > emphysema and left lower lobe consolidation January 2018 CT chest images personally reviewed showing severe centrilobular emphysema  most prominent in the upper lobes there is a 2.7 x 7.2 mm lesion in the left upper lobe other scattered nodules noted including a patchy area of consolidation versus nodule which is pleural-based in the left lower lobe 2.7 x 1.5 cm July 2018 CT chest images personally reviewed: new spiculated 2.2 cm RUL nodule, other nodules resolved.     Assessment & Plan:   COPD (chronic obstructive pulmonary disease) with acute bronchitis ([place])  Multiple pulmonary nodules  Chronic respiratory failure with hypoxia [facility])  Despite her multiple chronic medical issues this is been a stable interval for [place].  She has not had an exacerbation since the last visit.  She is gradually increasing her activity level and has been able to renovate her house on her own.  At this point I see no indication for changing medications.  She has multiple scattered pulmonary nodules which are seen repeatedly on CT scanning of the lungs, after the last study we decided to wait until July 2019 before we perform repeat scanning.  Plan: COPD: Continue taking Advair and Incruise Flu shot today Stay active  Chronic respiratory failure with hypoxemia: We will measure your oxygen while you are at rest and walking today on room air Keep using 2 L of oxygen when you exert yourself and when you sleep  Pulmonary nodules: We will plan on repeating a CT scan in July 2019  Follow-up with me in 6 months or sooner if needed  > 50% of this 26 minute visit spent face to face  Updated Medication List Outpatient Encounter Medications as of 05/07/2017  Medication Sig  . albuterol (PROVENTIL HFA;VENTOLIN HFA) 108 (90 Base) MCG/ACT inhaler Inhale 2 puffs into the lungs every 6 (six) hours as needed for wheezing or shortness of breath.  [name] albuterol (PROVENTIL) (2.5 MG/3ML) 0.083% nebulizer solution Take 3 mLs (2.5 mg total) by nebulization every 2 (two) hours as needed for wheezing or shortness of breath.  . Calcium Carbonate-Vitamin D  (CALCIUM-VITAMIN D) 500-200 MG-UNIT tablet Take 1 tablet by mouth daily.  . cetirizine (ZYRTEC) 10 MG tablet Take 10 mg by mouth daily.  . citalopram (CELEXA) 10 MG tablet take 1 tablet by mouth once daily  . Fluticasone-Salmeterol (ADVAIR DISKUS) 250-50 MCG/DOSE AEPB INHALE 1 PUFF INTO THE LUNGS EVERY 12 HOURS  . guaiFENesin (MUCINEX) 600 MG 12 hr tablet Take 1,200 mg by mouth 2 (two) times daily.   . INCRUSE ELLIPTA 62.5 MCG/INH AEPB INHALE 1 PUFF INTO THE LUNGS DAILY.  . montelukast (SINGULAIR) 10 MG tablet Take 1 tablet (10 mg total) by mouth at bedtime.  . Multiple Vitamin (MULTIVITAMIN WITH MINERALS) TABS tablet Take 1 tablet by mouth daily.  . OXYGEN Inhale 2 L into the lungs.  [name] oxymetazoline (AFRIN) 0.05 % nasal spray Place 1 spray into both nostrils 2 (  two) times daily as needed for congestion.  . sodium chloride HYPERTONIC 3 % nebulizer solution Take by nebulization 2 (two) times daily as needed for other.  [name] UNABLE TO FIND Med Name: Asthalin 142mcg - 2 puff PRN Pt gets through [facility]  . vitamin C (ASCORBIC ACID) 500 MG tablet Take 500 mg by mouth daily.  . fluticasone (FLONASE) 50 MCG/ACT nasal spray Place 2 sprays into both nostrils daily.   No facility-administered encounter medications on file as of 05/07/2017.

## 2017-07-21 DIAGNOSIS — Z87898 Personal history of other specified conditions: Secondary | ICD-10-CM | POA: Diagnosis not present

## 2017-07-21 DIAGNOSIS — Z85828 Personal history of other malignant neoplasm of skin: Secondary | ICD-10-CM | POA: Diagnosis not present

## 2017-07-21 DIAGNOSIS — L570 Actinic keratosis: Secondary | ICD-10-CM | POA: Diagnosis not present

## 2017-07-21 DIAGNOSIS — L820 Inflamed seborrheic keratosis: Secondary | ICD-10-CM | POA: Diagnosis not present

## 2017-07-21 DIAGNOSIS — D2271 Melanocytic nevi of right lower limb, including hip: Secondary | ICD-10-CM | POA: Diagnosis not present

## 2017-07-21 DIAGNOSIS — L719 Rosacea, unspecified: Secondary | ICD-10-CM | POA: Diagnosis not present

## 2017-07-21 DIAGNOSIS — Z23 Encounter for immunization: Secondary | ICD-10-CM | POA: Diagnosis not present

## 2017-07-21 DIAGNOSIS — L853 Xerosis cutis: Secondary | ICD-10-CM | POA: Diagnosis not present

## 2017-07-21 DIAGNOSIS — L821 Other seborrheic keratosis: Secondary | ICD-10-CM | POA: Diagnosis not present

## 2017-07-21 DIAGNOSIS — D225 Melanocytic nevi of trunk: Secondary | ICD-10-CM | POA: Diagnosis not present

## 2017-07-24 ENCOUNTER — Other Ambulatory Visit: Payer: Self-pay | Admitting: Pulmonary Disease

## 2017-08-08 ENCOUNTER — Telehealth: Payer: Self-pay | Admitting: Pharmacy Technician

## 2017-08-08 MED ORDER — ALBUTEROL SULFATE HFA 108 (90 BASE) MCG/ACT IN AERS: 2.0000 | INHALATION_SPRAY | Freq: Four times a day (QID) | RESPIRATORY_TRACT | 0 refills | Status: DC | PRN

## 2017-08-08 NOTE — Telephone Encounter (Signed)
Faxed Rx of pt's ventolin inhaler to [facility].  Received confirmation that the fax went through.  Called pt letting her know that the Rx was faxed.  Pt expressed understanding. Nothing further needed at this current time.

## 2017-08-18 ENCOUNTER — Telehealth: Payer: Self-pay | Admitting: Pulmonary Disease

## 2017-08-18 MED ORDER — UMECLIDINIUM BROMIDE 62.5 MCG/INH IN AEPB: 1.0000 | INHALATION_SPRAY | Freq: Every day | RESPIRATORY_TRACT | 5 refills | Status: DC

## 2017-08-18 NOTE — Telephone Encounter (Signed)
Called and spoke with patient, she states that she is needing a refill of the incruse sent to her pharmacy. Refill sent to pharmacy nothing further needed.

## 2017-10-17 DIAGNOSIS — H04123 Dry eye syndrome of bilateral lacrimal glands: Secondary | ICD-10-CM | POA: Diagnosis not present

## 2017-11-09 ENCOUNTER — Other Ambulatory Visit: Payer: Self-pay | Admitting: Pulmonary Disease

## 2017-11-16 ENCOUNTER — Other Ambulatory Visit: Payer: Self-pay | Admitting: Pulmonary Disease

## 2017-12-12 ENCOUNTER — Telehealth: Payer: Self-pay | Admitting: Pulmonary Disease

## 2017-12-12 MED ORDER — ALBUTEROL SULFATE HFA 108 (90 BASE) MCG/ACT IN AERS: 2.0000 | INHALATION_SPRAY | Freq: Four times a day (QID) | RESPIRATORY_TRACT | 0 refills | Status: DC | PRN

## 2017-12-12 NOTE — Telephone Encounter (Signed)
Called pt but unable to reach her.  Left message for pt to return call so we can get everything taken care of for her regarding the ventolin inhaler and so we can verify the exact pharmacy details.  Pt will also need to schedule a f/u appt.

## 2017-12-12 NOTE — Telephone Encounter (Signed)
Patient returned call, spoke with patient who is requesting a refill on her Ventolin be printed and faxed to:  [facility] [phone] [id number] (order number)  Advised patient that BQ is not in the office until 7.9.19 to sign the Rx Pt stated this is okay, she still has 1 full Ventolin inhaler left - she just likes to begin the process early  Rx printed and placed in BQ's lookat folder  **of note, patient is due for follow up.  Tried calling patient back to discuss but had to LM**

## 2017-12-13 NOTE — Telephone Encounter (Signed)
OK by me to send Rx We should get her back in sometime in the next month or two

## 2017-12-15 ENCOUNTER — Other Ambulatory Visit: Payer: Self-pay | Admitting: Pulmonary Disease

## 2017-12-15 MED ORDER — ALBUTEROL SULFATE HFA 108 (90 BASE) MCG/ACT IN AERS: 2.0000 | INHALATION_SPRAY | Freq: Four times a day (QID) | RESPIRATORY_TRACT | 0 refills | Status: DC | PRN

## 2017-12-15 NOTE — Telephone Encounter (Signed)
Rx has been printed and placed in BQ's lookat for him to sign.  A note has been placed with the Rx with the fax information needed.  Called and spoke with pt letting her know we had the Rx printed and ready for BQ to sign once he returns back to the office. Pt expressed understanding.  Also scheduled pt a f/u appt with BQ 8/21.  Nothing further needed.

## 2017-12-16 NOTE — Telephone Encounter (Signed)
Rx received back from Dr [place] Bells and faxed to [facility] Called spoke with patient to make her aware Nothing further needed; will sign off

## 2017-12-19 ENCOUNTER — Telehealth: Payer: Self-pay | Admitting: Pulmonary Disease

## 2017-12-19 NOTE — Telephone Encounter (Signed)
Called and spoke with patient, she states that the pharmacy ([facility]) has not received her prescription. Due to patient stating that she needs it as soon as possible I have printed rx again, stamped with BQ signature and faxed again. Patient is aware. Nothing further needed.

## 2017-12-26 ENCOUNTER — Telehealth: Payer: Self-pay | Admitting: Pulmonary Disease

## 2017-12-26 MED ORDER — ALBUTEROL SULFATE HFA 108 (90 BASE) MCG/ACT IN AERS: 2.0000 | INHALATION_SPRAY | Freq: Four times a day (QID) | RESPIRATORY_TRACT | 0 refills | Status: DC | PRN

## 2017-12-26 NOTE — Telephone Encounter (Signed)
Spoke with pt. She is needing a refill on Ventolin. Pt called in on 12/12/17 about this same matter. Her prescription was faxed to [facility] on 12/15/17. Pt states that the pharmacy never received. A new rx has been printed and placed in BQ's look at to be signed. It will need to be faxed to [phone]. Will route to [place] as she will be working with BQ next week.

## 2017-12-29 NOTE — Telephone Encounter (Signed)
Pt is calling for an update about medication. Per Pt, pharm has not received the faxed Rx as of this morning. Pt confirmed that the fax number is [phone] Cb is [phone].

## 2017-12-29 NOTE — Telephone Encounter (Signed)
lmtcb for pt.  BQ is not in office till Wednesday and will sign rx then. Need to relay this to the pt.

## 2017-12-30 ENCOUNTER — Other Ambulatory Visit: Payer: Self-pay | Admitting: Pulmonary Disease

## 2018-01-01 MED ORDER — ALBUTEROL SULFATE HFA 108 (90 BASE) MCG/ACT IN AERS: 2.0000 | INHALATION_SPRAY | Freq: Four times a day (QID) | RESPIRATORY_TRACT | 0 refills | Status: DC | PRN

## 2018-01-01 NOTE — Telephone Encounter (Signed)
I have called [facility] and they have given me another fax # to try---[phone].  I have printed out the rx for the ventolin and have faxed this for the pt.  I will call back to see if this rx was received today. This was received and they will contact the pt once this is getting ready to ship.  I called the pt and she is aware of this process. Nothing further is needed.

## 2018-01-01 NOTE — Telephone Encounter (Signed)
Patient is calling for status of RX, CB is [phone] home, [phone].  Patient will be out of medication shortly and states it takes about 2 weeks for it to be shipped once pharmacy receives the RX.

## 2018-01-01 NOTE — Telephone Encounter (Signed)
Looked in BQ cubby, and all folders did not see any rx for this pt Asked Lesleigh Noe who is working with BQ today to see if he has rx Waiting on Margie to get back with me regarding the rx.

## 2018-01-26 ENCOUNTER — Telehealth: Payer: Self-pay | Admitting: Pulmonary Disease

## 2018-01-26 ENCOUNTER — Other Ambulatory Visit: Payer: Self-pay | Admitting: Pulmonary Disease

## 2018-01-26 DIAGNOSIS — R911 Solitary pulmonary nodule: Secondary | ICD-10-CM

## 2018-01-26 MED ORDER — FLUTICASONE-SALMETEROL 250-50 MCG/DOSE IN AEPB: INHALATION_SPRAY | RESPIRATORY_TRACT | 0 refills | Status: DC

## 2018-01-26 NOTE — Telephone Encounter (Signed)
Spoke with pt. She is needing a refill on Advair. Pt has a pending OV with BQ on 01/28/18. Rx has been sent in. Nothing further was needed.

## 2018-01-28 ENCOUNTER — Encounter: Payer: Self-pay | Admitting: Pulmonary Disease

## 2018-01-28 ENCOUNTER — Inpatient Hospital Stay: Admission: RE | Admit: 2018-01-28 | Payer: Medicare Other | Source: Ambulatory Visit

## 2018-01-28 ENCOUNTER — Ambulatory Visit (INDEPENDENT_AMBULATORY_CARE_PROVIDER_SITE_OTHER): Payer: Medicare Other | Admitting: Pulmonary Disease

## 2018-01-28 ENCOUNTER — Other Ambulatory Visit: Payer: Self-pay | Admitting: Family Medicine

## 2018-01-28 VITALS — BP 124/72 | HR 90 | Ht 66.14 in | Wt 137.0 lb

## 2018-01-28 DIAGNOSIS — R918 Other nonspecific abnormal finding of lung field: Secondary | ICD-10-CM | POA: Diagnosis not present

## 2018-01-28 DIAGNOSIS — J209 Acute bronchitis, unspecified: Secondary | ICD-10-CM

## 2018-01-28 DIAGNOSIS — F411 Generalized anxiety disorder: Secondary | ICD-10-CM

## 2018-01-28 DIAGNOSIS — R911 Solitary pulmonary nodule: Secondary | ICD-10-CM

## 2018-01-28 DIAGNOSIS — J9611 Chronic respiratory failure with hypoxia: Secondary | ICD-10-CM

## 2018-01-28 DIAGNOSIS — J440 Chronic obstructive pulmonary disease with acute lower respiratory infection: Secondary | ICD-10-CM | POA: Diagnosis not present

## 2018-01-28 MED ORDER — SODIUM CHLORIDE 3 % IN NEBU: INHALATION_SOLUTION | Freq: Two times a day (BID) | RESPIRATORY_TRACT | 11 refills | Status: DC | PRN

## 2018-01-28 NOTE — Patient Instructions (Signed)
Pulmonary nodules: We will call you when we hear the results of next week CT scan  COPD: Continue Advair and Incruse Continue albuterol as needed Use hypertonic saline 1-2 times a day to help clear mucus out Continue Mucinex  Chronic respiratory failure with hypoxemia: Keep using oxygen as you are doing  Get a high-dose flu shot when they are available Practice good hand hygiene Stay active  We will see you back in 6 months or sooner if needed

## 2018-01-28 NOTE — Progress Notes (Signed)
Subjective:    Patient ID: Courtney Ayers, female    DOB: 1945/11/07, 72 y.o.   MRN: [id number]  Synopsis: Courtney Ayers was admitted for COPD in 2016 She had a PFT on 09/2014 which showed an FEV1 of 0.81L (31% pred) CT chest in 07/2014 showed significant emphysema and a 1cm nodule PET CT 2015 showed the nodule was not FCG avid November 2016 CT chest: No change in previously seen nodules, in fact most are smaller, but there is a new 1.4 cm sub-solid nodule seen, recommend follow-up 3 months  HPI  Chief Complaint  Patient presents with  . Follow-up   Courtney Ayers says that she has gained a little weight since the last visit.  She says taht she has been trying to eat a little.   She has not been sick with bronchitis or pneumonia.  She still produces mucus every morning.  She tries to avoid the nocturnal dose of the hypertonic saline because she feels like it dehydrates her.  She has ben taking her inhalers and oxygen routinely.  This morning she was really straining to produce some mucus.    She has an abscessed tooth and has to go have it operated on by an Chief Financial Officer tomorrow.  She is supposed to have a root canal and was treated with amoxicillin.    Past Medical History:  Diagnosis Date  . Adenomatous colon polyp   . Allergic rhinitis, cause unspecified    seasonal  . COPD (chronic obstructive pulmonary disease) ([place])    no home O2  . Depressive disorder, not elsewhere classified 2006   some anxiety recurs when she has stopped med in past  . OA (osteoarthritis)    B THUMB, RIGHT > LEFT HIP, BACK  . Osteopenia      Review of Systems  Constitutional: Negative for chills, fatigue and fever.  HENT: Negative for postnasal drip, rhinorrhea and sinus pressure.   Respiratory: Positive for cough and shortness of breath. Negative for wheezing.   Cardiovascular: Negative for chest pain, palpitations and leg swelling.       Objective:   Physical Exam Vitals:   01/28/18 1417  BP:  124/72  Pulse: 90  SpO2: 92%  Weight: 137 lb (62.1 kg)  Height: 5' 6.14" (1.68 m)  RA  Gen: chronically ill appearing HENT: OP clear, TM's clear, neck supple PULM: Poor air movement but no wheezing B, normal percussion CV: RRR, no mgr, trace edema GI: BS+, soft, nontender Derm: chronic bilateral foot cyanosis but no rash Psyche: normal mood and affect     CBC    Component Value Date/Time   WBC 6.1 07/01/2016 1404   RBC 4.20 07/01/2016 1404   HGB 13.7 07/01/2016 1404   HCT 41.1 07/01/2016 1404   PLT 219 07/01/2016 1404   MCV 97.9 07/01/2016 1404   MCH 32.6 07/01/2016 1404   MCHC 33.3 07/01/2016 1404   RDW 13.8 07/01/2016 1404   LYMPHSABS 1,891 07/01/2016 1404   MONOABS 427 07/01/2016 1404   EOSABS 0 (L) 07/01/2016 1404   BASOSABS 0 07/01/2016 1404   Micro February 2017 CHY:IFOYDXAJOIN ochraceus 07/2015 Bronch: Aspergillus ochraceus isolated and cladosporium  Imaging: February 2016 CT angiogram of the chest images reviewed> there is moderate to severe emphysema bilaterally throughout both lungs, there is a 1 cm, smooth bordered, solid nodule in the right lower lobe 08/2014 PET CT 26mm RLL nodule is not hypermetabolic, no mediastinal lymph nodes 03/2015 CT chest > no change in nodule previously  seen but there are multiple new nodules that the radiologist feels may be inflammatory, rec repeat in 6-8 weeks November 2016 CT chest: No change in previously seen nodules, in fact most are smaller, but there is a new 1.4 cm sub-solid nodule seen, recommend follow-up 3 months 07/2015 CT chest > signifnicant emphysema many of the nodules previously seen have decreased in size but there are now new ones which are approximately 1.47 m in size 12/2015 CT chest > improved prior nodules, new 1.5x1.7cm LUL posterior nodule 05/2016 CT chest > emphysema and left lower lobe consolidation January 2018 CT chest images personally reviewed showing severe centrilobular emphysema most prominent in the  upper lobes there is a 2.7 x 7.2 mm lesion in the left upper lobe other scattered nodules noted including a patchy area of consolidation versus nodule which is pleural-based in the left lower lobe 2.7 x 1.5 cm July 2018 CT chest images personally reviewed: new spiculated 2.2 cm RUL nodule, other nodules resolved.     Assessment & Plan:   Solitary pulmonary nodule  COPD (chronic obstructive pulmonary disease) with acute bronchitis (HCC)  Multiple pulmonary nodules  Chronic respiratory failure with hypoxia (HCC)  Discussion: Despite her severe COPD this has been a stable interval for [place].  She has not had an exacerbation since the last visit.  She is compliant with her inhaled medicines and they seem to be effective.  She needs to use hypertonic saline routinely to help with pulmonary toilet issues.  But in general considering the fact that she has not had an exacerbation is a good thing.  We have decided for her to undergo annual CT scans because she is had these waxing and waning nodules over the years of undetermined etiology.  The next CT will be next week  Plan: Pulmonary nodules: We will call you when we hear the results of next week CT scan  COPD: Continue Advair and Incruse Continue albuterol as needed Use hypertonic saline 1-2 times a day to help clear mucus out Continue Mucinex  Chronic respiratory failure with hypoxemia: Keep using oxygen as you are doing  Get a high-dose flu shot when they are available Practice good hand hygiene Stay active  We will see you back in 6 months or sooner if needed  Updated Medication List Outpatient Encounter Medications as of 01/28/2018  Medication Sig  . albuterol (PROVENTIL HFA;VENTOLIN HFA) 108 (90 Base) MCG/ACT inhaler Inhale 2 puffs into the lungs every 6 (six) hours as needed for wheezing or shortness of breath.  [name] albuterol (PROVENTIL) (2.5 MG/3ML) 0.083% nebulizer solution USE 1 VIAL VIA NEBULIZER EVERY 2 HOURS AS NEEDED  FOR SHORTNESS OF BREATH  . Calcium Carbonate-Vitamin D (CALCIUM-VITAMIN D) 500-200 MG-UNIT tablet Take 1 tablet by mouth daily.  . cetirizine (ZYRTEC) 10 MG tablet Take 10 mg by mouth daily.  . citalopram (CELEXA) 10 MG tablet take 1 tablet by mouth once daily  . Fluticasone-Salmeterol (ADVAIR DISKUS) 250-50 MCG/DOSE AEPB INHALE 1 PUFF INTO THE LUNGS EVERY 12 HOURS  . guaiFENesin (MUCINEX) 600 MG 12 hr tablet Take 1,200 mg by mouth 2 (two) times daily.   . montelukast (SINGULAIR) 10 MG tablet TAKE 1 TABLET BY MOUTH EVERYDAY AT BEDTIME  . Multiple Vitamin (MULTIVITAMIN WITH MINERALS) TABS tablet Take 1 tablet by mouth daily.  . OXYGEN Inhale 2 L into the lungs.  [name] oxymetazoline (AFRIN) 0.05 % nasal spray Place 1 spray into both nostrils 2 (two) times daily as needed for congestion.  [name]  sodium chloride HYPERTONIC 3 % nebulizer solution Take by nebulization 2 (two) times daily as needed for other.  . umeclidinium bromide (INCRUSE ELLIPTA) 62.5 MCG/INH AEPB Inhale 1 puff into the lungs daily.  [name] UNABLE TO FIND Med Name: Asthalin 116mcg - 2 puff PRN Pt gets through [facility]  . vitamin C (ASCORBIC ACID) 500 MG tablet Take 500 mg by mouth daily.  . [DISCONTINUED] sodium chloride HYPERTONIC 3 % nebulizer solution Take by nebulization 2 (two) times daily as needed for other.  . fluticasone (FLONASE) 50 MCG/ACT nasal spray Place 2 sprays into both nostrils daily.   No facility-administered encounter medications on file as of 01/28/2018.

## 2018-01-28 NOTE — Telephone Encounter (Signed)
[facility] is requesting to fill pt celexa. Please advise KH 

## 2018-01-28 NOTE — Telephone Encounter (Signed)
Needs an appt

## 2018-01-30 NOTE — Telephone Encounter (Signed)
Called pt to advise that she is over due for a med check appt. lvm for pt to call back . [place]

## 2018-02-03 ENCOUNTER — Ambulatory Visit (INDEPENDENT_AMBULATORY_CARE_PROVIDER_SITE_OTHER)
Admission: RE | Admit: 2018-02-03 | Discharge: 2018-02-03 | Disposition: A | Discharge status: Home / Self Care | Payer: Medicare Other | Source: Ambulatory Visit | Attending: Pulmonary Disease | Admitting: Pulmonary Disease

## 2018-02-03 DIAGNOSIS — R911 Solitary pulmonary nodule: Secondary | ICD-10-CM | POA: Diagnosis not present

## 2018-02-10 ENCOUNTER — Other Ambulatory Visit: Payer: Self-pay | Admitting: Pulmonary Disease

## 2018-02-21 ENCOUNTER — Other Ambulatory Visit: Payer: Self-pay | Admitting: Pulmonary Disease

## 2018-02-23 ENCOUNTER — Ambulatory Visit (INDEPENDENT_AMBULATORY_CARE_PROVIDER_SITE_OTHER): Payer: Medicare Other | Admitting: Family Medicine

## 2018-02-23 ENCOUNTER — Encounter: Payer: Self-pay | Admitting: Family Medicine

## 2018-02-23 VITALS — BP 142/82 | HR 78 | Temp 97.8°F | Ht 66.75 in | Wt 138.4 lb

## 2018-02-23 DIAGNOSIS — R918 Other nonspecific abnormal finding of lung field: Secondary | ICD-10-CM | POA: Diagnosis not present

## 2018-02-23 DIAGNOSIS — Z86010 Personal history of colonic polyps: Secondary | ICD-10-CM

## 2018-02-23 DIAGNOSIS — J9611 Chronic respiratory failure with hypoxia: Secondary | ICD-10-CM | POA: Diagnosis not present

## 2018-02-23 DIAGNOSIS — R739 Hyperglycemia, unspecified: Secondary | ICD-10-CM | POA: Diagnosis not present

## 2018-02-23 DIAGNOSIS — F341 Dysthymic disorder: Secondary | ICD-10-CM | POA: Diagnosis not present

## 2018-02-23 DIAGNOSIS — J301 Allergic rhinitis due to pollen: Secondary | ICD-10-CM | POA: Diagnosis not present

## 2018-02-23 DIAGNOSIS — Z13220 Encounter for screening for lipoid disorders: Secondary | ICD-10-CM

## 2018-02-23 DIAGNOSIS — F411 Generalized anxiety disorder: Secondary | ICD-10-CM | POA: Diagnosis not present

## 2018-02-23 MED ORDER — CITALOPRAM HYDROBROMIDE 10 MG PO TABS
ORAL_TABLET | ORAL | 3 refills | Status: DC
Start: 2018-02-23 — End: 2019-04-16

## 2018-02-23 NOTE — Progress Notes (Signed)
Courtney Ayers is a 72 y.o. female who presents for annual wellness visit and follow-up on chronic medical conditions.  She is followed regularly by pulmonary.  She is presently on chronic O2 therapy.  She is on Incruse Ellipta as well as Advair.  She does use albuterol on an as-needed basis.  Also continues on Singulair and Zyrtec for her underlying allergies.  She does note to pace herself concerning her oxygen level.  She is doing quite nicely on Celexa 10 mg and would like to continue on that.  She is supposed to have a colonoscopy however pulmonary and GI agreed to postpone that indefinitely because of her underlying pulmonary status.  Immunizations and Health Maintenance Immunization History  Administered Date(s) Administered  . Influenza Split 07/03/2011, 02/14/2012  . Influenza Whole 05/16/2004  . Influenza, High Dose Seasonal PF 03/18/2013, 03/24/2014, 03/08/2015  . Influenza,inj,Quad PF,6+ Mos 03/21/2016, 05/07/2017  . PPD Test 05/30/1999  . Pneumococcal Conjugate-13 03/24/2014  . Pneumococcal Polysaccharide-23 08/06/2000, 01/30/2011  . Td 09/23/2001  . Tdap 01/30/2011  . Zoster 03/05/2011  . Zoster Recombinat (Shingrix) 12/20/2016   Health Maintenance Due  Topic Date Due  . COLONOSCOPY  08/28/2016  . INFLUENZA VACCINE  01/08/2018    Last Pap smear:over five years  Last mammogram: over a year ago Last colonoscopy: 3-4 years ago Last DEXA: over a year Dentist:2019 Ophtho: 2019 Exercise: walking   Other doctors caring for patient include:McQuaid,Blackman  Advanced directives: Yes copy in chart Does Patient Have a Medical Advance Directive?: Yes Type of Advance Directive: [place] will Does patient want to make changes to medical advance directive?: No - Patient declined  Depression screen:  See questionnaire below.  Depression screen [facility] 2/9 02/23/2018 05/15/2016 11/23/2015  Decreased Interest 0 0 0  Down, Depressed, Hopeless 0 0 0  PHQ - 2  Score 0 0 0    Fall Risk Screen: see questionnaire below. Fall Risk  02/23/2018 05/15/2016 11/23/2015  Falls in the past year? No No No    ADL screen:  See questionnaire below Functional Status Survey: Is the patient deaf or have difficulty hearing?: No Does the patient have difficulty seeing, even when wearing glasses/contacts?: No Does the patient have difficulty concentrating, remembering, or making decisions?: No Does the patient have difficulty walking or climbing stairs?: No Does the patient have difficulty dressing or bathing?: No Does the patient have difficulty doing errands alone such as visiting a doctor's office or shopping?: No   Review of Systems Constitutional: -, -unexpected weight change, -anorexia, -fatigue Allergy: -sneezing, -itching, -congestion Dermatology: denies changing moles, rash, lumps ENT: -runny nose, -ear pain, -sore throat,  Cardiology:  -chest pain, -palpitations, -orthopnea, Respiratory: -cough, -shortness of breath, -dyspnea on exertion, -wheezing,  Gastroenterology: -abdominal pain, -nausea, -vomiting, -diarrhea, -constipation, -dysphagia Hematology: -bleeding or bruising problems Musculoskeletal: -arthralgias, -myalgias, -joint swelling, -back pain, - Ophthalmology: -vision changes,  Urology: -dysuria, -difficulty urinating,  -urinary frequency, -urgency, incontinence Neurology: -, -numbness, , -memory loss, -falls, -dizziness    PHYSICAL EXAM:  BP (!) 142/82 (BP Location: Left Arm, Patient Position: Sitting)   Pulse 78   Temp 97.8 F (36.6 C)   Ht 5' 6.75" (1.695 m)   Wt 138 lb 6.4 oz (62.8 kg)   LMP 06/10/1992   SpO2 98%   BMI 21.84 kg/m   General Appearance: Alert, cooperative, no distress, appears stated age Head: Normocephalic, without obvious abnormality, atraumatic Eyes: PERRL, conjunctiva/corneas clear, EOM's intact, fundi benign Ears: Normal TM's and external  ear canals Nose: Nares normal, mucosa normal, no drainage or  sinus tenderness Throat: Lips, mucosa, and tongue normal; teeth and gums normal Neck: Supple, no lymphadenopathy;  thyroid:  no enlargement/tenderness/nodules; no carotid bruit or JVD Lungs: Clear to auscultation bilaterally without wheezes, rales or ronchi; respirations unlabored Heart: Regular rate and rhythm, S1 and S2 normal, no murmur, rubor gallop Abdomen: Soft, non-tender, nondistended, normoactive bowel sounds,  no masses, no hepatosplenomegaly Extremities: No clubbing, no edema but cyanosis present in her toes.  This has been stable for several years. Pulses: 2+ and symmetric all extremities Skin:  Skin color, texture, turgor normal, no rashes or lesions Lymph nodes: Cervical, supraclavicular, and axillary nodes normal Neurologic:  CNII-XII intact, normal strength, sensation and gait; reflexes 2+ and symmetric throughout Psych: Normal mood, affect, hygiene and grooming.  ASSESSMENT/PLAN: Non-seasonal allergic rhinitis due to pollen  Chronic respiratory failure with hypoxia (HCC) - Plan: CBC with Differential/Platelet, Comprehensive metabolic panel  History of colonic polyps  Multiple pulmonary nodules - Plan: CBC with Differential/Platelet, Comprehensive metabolic panel  Hyperglycemia - Plan: CBC with Differential/Platelet, Comprehensive metabolic panel  Dysthymia - Plan: CBC with Differential/Platelet, Comprehensive metabolic panel  Screening for lipid disorders - Plan: Lipid panel  Anxiety state - Plan: citalopram (CELEXA) 10 MG tablet Overall she is seen for doing quite well on her present regimen and has a good attitude towards her underlying lung disease.  She will continue on her present medication regimen.      Medicare Attestation I have personally reviewed: The patient's medical and social history Their use of alcohol, tobacco or illicit drugs Their current medications and supplements The patient's functional ability including ADLs,fall risks, home safety  risks, cognitive, and hearing and visual impairment Diet and physical activities Evidence for depression or mood disorders  The patient's weight, height, and BMI have been recorded in the chart.  I have made referrals, counseling, and provided education to the patient based on review of the above and I have provided the patient with a written personalized care plan for preventive services.     Jill Alexanders, MD   02/23/2018

## 2018-02-24 LAB — LIPID PANEL
CHOL/HDL RATIO: 1.7 ratio (ref 0.0–4.4)
CHOLESTEROL TOTAL: 147 mg/dL (ref 100–199)
HDL: 85 mg/dL (ref >39)
LDL CALC: 48 mg/dL (ref 0–99)
Triglycerides: 71 mg/dL (ref 0–149)
VLDL Cholesterol Cal: 14 mg/dL (ref 5–40)

## 2018-02-24 LAB — COMPREHENSIVE METABOLIC PANEL
ALK PHOS: 101 IU/L (ref 39–117)
ALT: 17 IU/L (ref 0–32)
AST: 21 IU/L (ref 0–40)
Albumin/Globulin Ratio: 2.5 — ABNORMAL HIGH (ref 1.2–2.2)
Albumin: 4.5 g/dL (ref 3.5–4.8)
BUN/Creatinine Ratio: 18 (ref 12–28)
BUN: 11 mg/dL (ref 8–27)
Bilirubin Total: 0.3 mg/dL (ref 0.0–1.2)
CO2: 28 mmol/L (ref 20–29)
CREATININE: 0.61 mg/dL (ref 0.57–1.00)
Calcium: 9.2 mg/dL (ref 8.7–10.3)
Chloride: 101 mmol/L (ref 96–106)
GFR calc Af Amer: 105 mL/min/{1.73_m2} (ref >59)
GFR, EST NON AFRICAN AMERICAN: 91 mL/min/{1.73_m2} (ref >59)
GLOBULIN, TOTAL: 1.8 g/dL (ref 1.5–4.5)
Glucose: 105 mg/dL — ABNORMAL HIGH (ref 65–99)
Potassium: 4.6 mmol/L (ref 3.5–5.2)
SODIUM: 143 mmol/L (ref 134–144)
Total Protein: 6.3 g/dL (ref 6.0–8.5)

## 2018-02-24 LAB — CBC WITH DIFFERENTIAL/PLATELET
Basophils Absolute: 0 10*3/uL (ref 0.0–0.2)
Basos: 0 %
EOS (ABSOLUTE): 0.2 10*3/uL (ref 0.0–0.4)
EOS: 3 %
HEMATOCRIT: 38.2 % (ref 34.0–46.6)
Hemoglobin: 13 g/dL (ref 11.1–15.9)
Immature Grans (Abs): 0 10*3/uL (ref 0.0–0.1)
Immature Granulocytes: 0 %
LYMPHS ABS: 1.2 10*3/uL (ref 0.7–3.1)
Lymphs: 22 %
MCH: 31.3 pg (ref 26.6–33.0)
MCHC: 34 g/dL (ref 31.5–35.7)
MCV: 92 fL (ref 79–97)
MONOCYTES: 7 %
Monocytes Absolute: 0.4 10*3/uL (ref 0.1–0.9)
Neutrophils Absolute: 3.7 10*3/uL (ref 1.4–7.0)
Neutrophils: 68 %
Platelets: 205 10*3/uL (ref 150–450)
RBC: 4.15 x10E6/uL (ref 3.77–5.28)
RDW: 12.8 % (ref 12.3–15.4)
WBC: 5.4 10*3/uL (ref 3.4–10.8)

## 2018-02-24 IMAGING — CR DG CHEST 2V
2 series · 2 of 2 positions shown · non-contrast
Comparison: 08/22/2015.

CLINICAL DATA: Increasing shortness of breath.

EXAM:
CHEST  2 VIEW

[w chest pa]
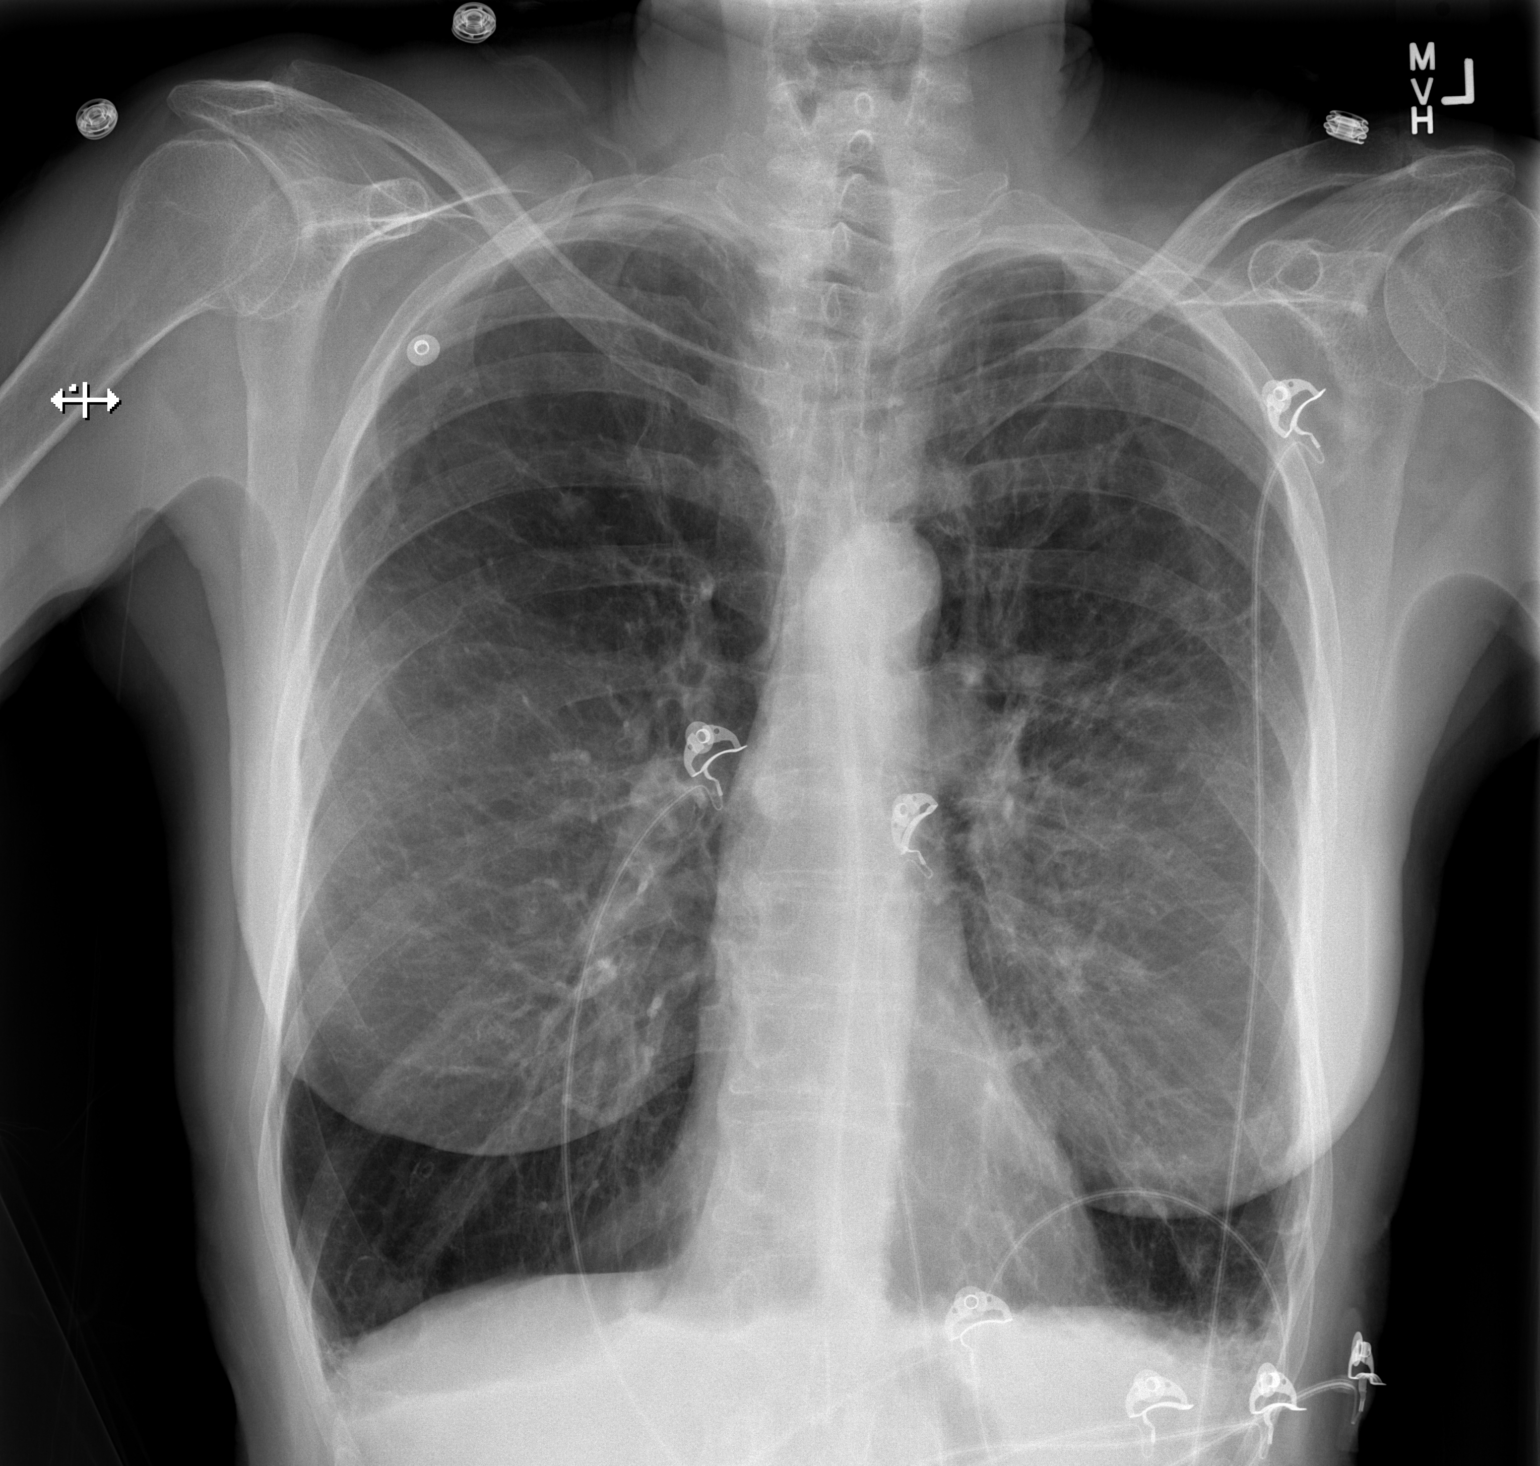

[w chest lat]
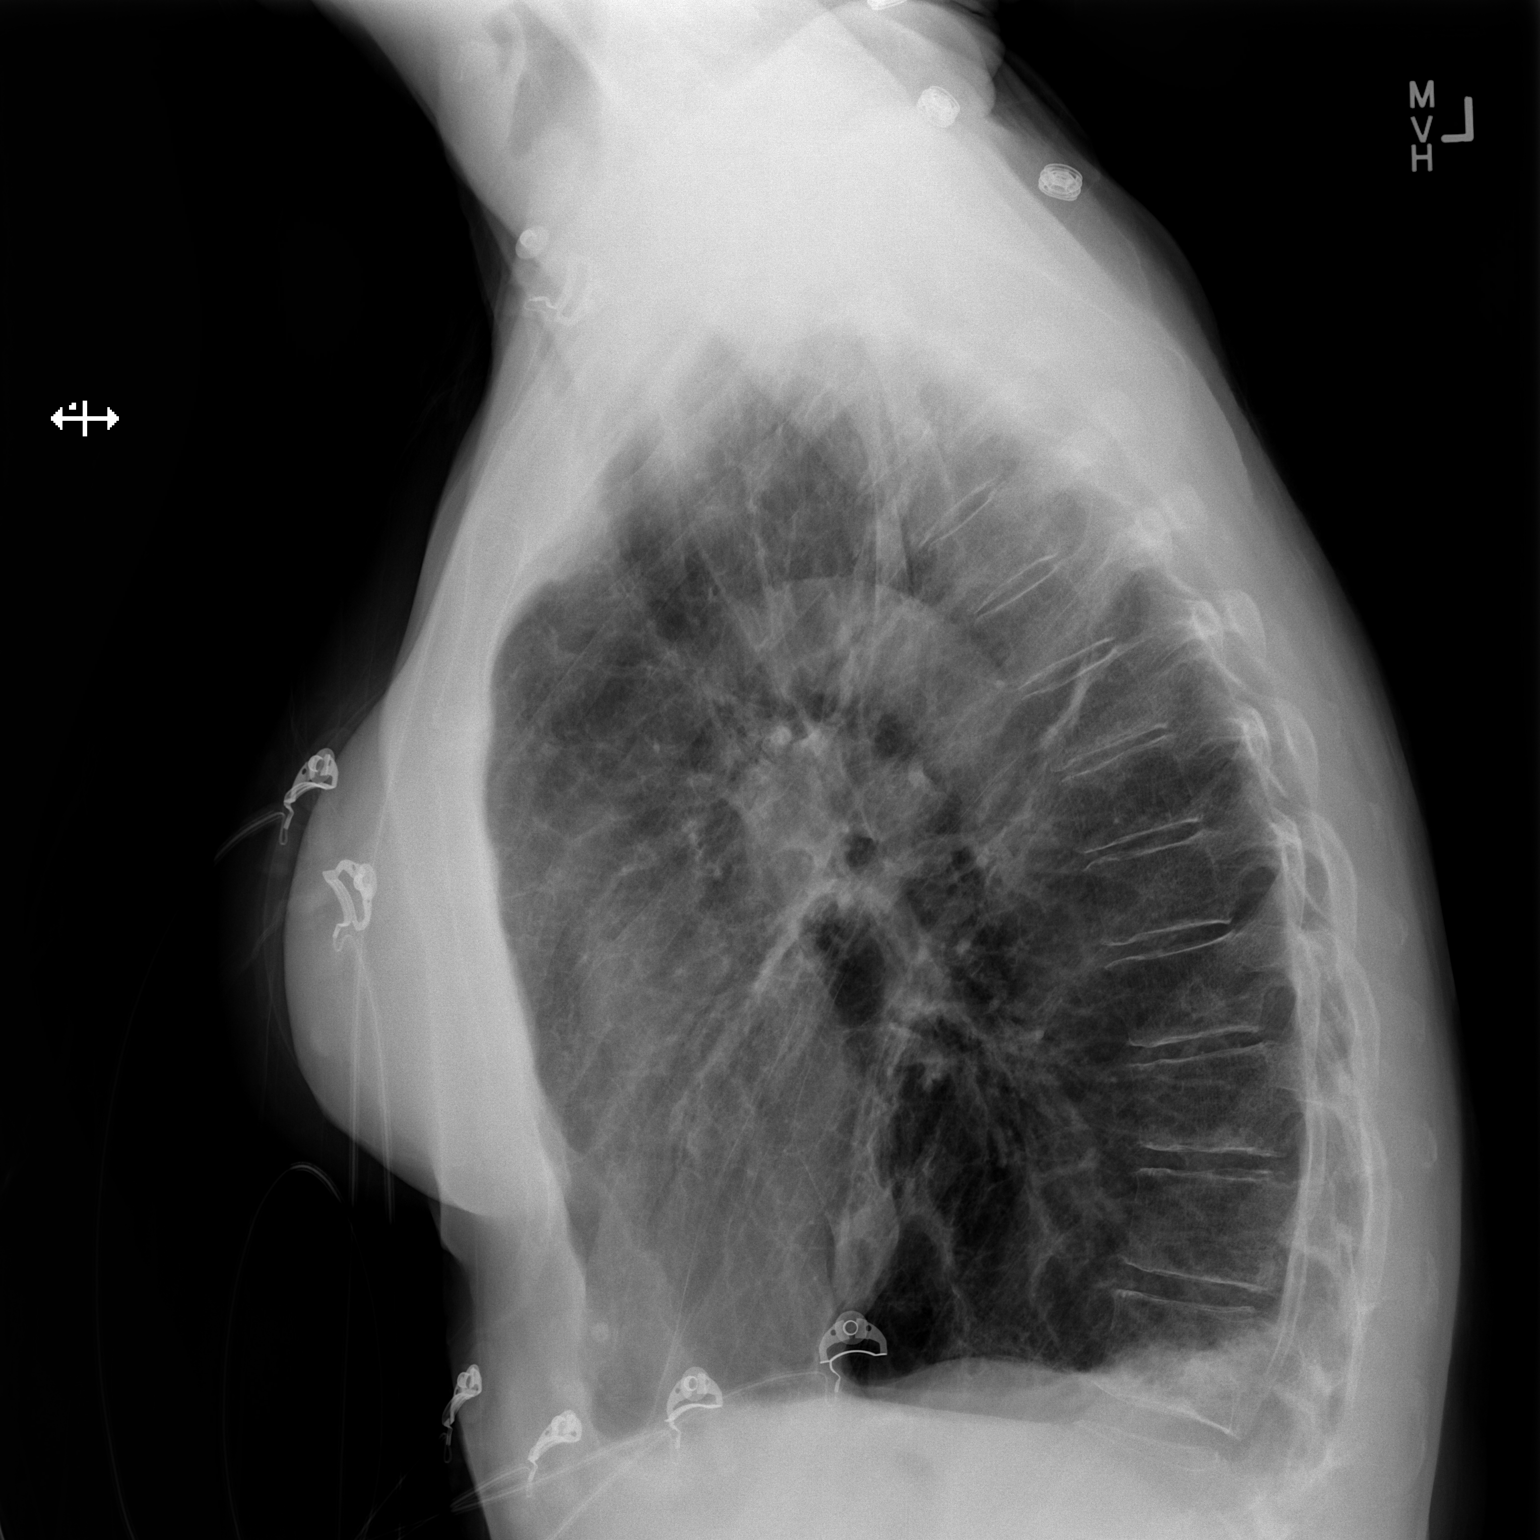

[2 of 2 positions shown; findings below may reference images not displayed]

FINDINGS: Severe emphysema. Bilateral nodular densities without noted
progression since previous chest x-ray or CT. There is no edema,
consolidation, effusion, or pneumothorax. Normal heart size and
mediastinal contours.
IMPRESSION: 1. Emphysema without acute superimposed finding.
2. Known pulmonary nodules as described on 01/01/2016 chest CT.

## 2018-03-27 DIAGNOSIS — Z23 Encounter for immunization: Secondary | ICD-10-CM | POA: Diagnosis not present

## 2018-04-15 ENCOUNTER — Telehealth: Payer: Self-pay | Admitting: Pulmonary Disease

## 2018-04-15 MED ORDER — ALBUTEROL SULFATE HFA 108 (90 BASE) MCG/ACT IN AERS: 2.0000 | INHALATION_SPRAY | Freq: Four times a day (QID) | RESPIRATORY_TRACT | 0 refills | Status: DC | PRN

## 2018-04-15 NOTE — Telephone Encounter (Signed)
Called and spoke with patient she needs a refill of ventolin. Prescription printed and faxed to [facility] online to fax number provided by patient nothing further needed.

## 2018-07-14 ENCOUNTER — Telehealth: Payer: Self-pay | Admitting: Pulmonary Disease

## 2018-07-14 MED ORDER — ALBUTEROL SULFATE HFA 108 (90 BASE) MCG/ACT IN AERS: 2.0000 | INHALATION_SPRAY | Freq: Four times a day (QID) | RESPIRATORY_TRACT | 0 refills | Status: DC | PRN

## 2018-07-14 NOTE — Telephone Encounter (Signed)
Called and spoke with patient, advised that this has been sent to her pharmacy. Nothing further needed.

## 2018-07-14 NOTE — Telephone Encounter (Signed)
Called and spoke with patient, she is requesting a refill of her albuterol inhaler although she has one left. The refill was last sent in on 04/15/2018 with 3 inhalers and 0 refills. Patient stated that she takes this inhaler daily TID. She is also currently on two maintenance inhalers incruse and advair. Patient stated that she gets anxious and SOB daily so that is why she is taking the albuterol inhaler so much.    BQ please advise if this is ok to refill or if we need to bring the patient in for evaluation of inhalers. Thank you.

## 2018-07-14 NOTE — Telephone Encounter (Signed)
OK to refill

## 2018-07-16 ENCOUNTER — Inpatient Hospital Stay: Admission: RE | Admit: 2018-07-16 | Payer: Medicare Other | Source: Ambulatory Visit

## 2018-07-16 ENCOUNTER — Telehealth: Payer: Self-pay | Admitting: Pulmonary Disease

## 2018-07-16 ENCOUNTER — Other Ambulatory Visit: Payer: Self-pay | Admitting: Pulmonary Disease

## 2018-07-16 DIAGNOSIS — J440 Chronic obstructive pulmonary disease with acute lower respiratory infection: Principal | ICD-10-CM

## 2018-07-16 DIAGNOSIS — J209 Acute bronchitis, unspecified: Secondary | ICD-10-CM

## 2018-07-16 MED ORDER — ALBUTEROL SULFATE HFA 108 (90 BASE) MCG/ACT IN AERS: 2.0000 | INHALATION_SPRAY | Freq: Four times a day (QID) | RESPIRATORY_TRACT | 1 refills | Status: DC | PRN

## 2018-07-16 NOTE — Telephone Encounter (Signed)
Attempted to call Patient.  Left message to call back.   Albuterol inhaler prescription sent to [facility], not [facility] as requested.

## 2018-07-16 NOTE — Telephone Encounter (Signed)
Patient calling with pharmacy information.  [facility] online email [email or web address].  Fax [phone].  Patient phone number [phone].

## 2018-07-16 NOTE — Telephone Encounter (Signed)
Spoke with pt, she would like [place] to send the Rx to the [facility]. I advised her I could fax it to the number given but it was time to schedule follow up. Pt agreed and I scheduled her to see BQ on 08/18/2018 at 3:45.  Rx faxed to [facility]  I waited for confirmation and stated the number was busy X 2. I will try to fax again tomorrow.

## 2018-07-17 NOTE — Telephone Encounter (Signed)
I emailed the Rx to [email or web address] . The email went through successfully. Nothing further is needed.   If patient calls back just let her know they should have it and that I had a problem with the fax number she gave me.

## 2018-07-31 ENCOUNTER — Telehealth: Payer: Self-pay | Admitting: Pulmonary Disease

## 2018-07-31 MED ORDER — UMECLIDINIUM BROMIDE 62.5 MCG/INH IN AEPB: 1.0000 | INHALATION_SPRAY | Freq: Every day | RESPIRATORY_TRACT | 5 refills | Status: DC

## 2018-07-31 NOTE — Telephone Encounter (Signed)
rx sent to pharmacy as requested.  Pt aware.  Nothing further needed at this time- will close encounter.

## 2018-08-10 ENCOUNTER — Telehealth: Payer: Self-pay | Admitting: Pulmonary Disease

## 2018-08-10 MED ORDER — PREDNISONE 20 MG PO TABS: 20.0000 mg | ORAL_TABLET | Freq: Every day | ORAL | 0 refills | Status: DC

## 2018-08-10 MED ORDER — DOXYCYCLINE HYCLATE 100 MG PO TABS: 100.0000 mg | ORAL_TABLET | Freq: Two times a day (BID) | ORAL | 0 refills | Status: DC

## 2018-08-10 NOTE — Telephone Encounter (Signed)
Primary Pulmonologist: BQ Last office visit and with whom: 01/28/2018 What do we see them for (pulmonary problems): solitary pulmonary nodule Last OV assessment/plan:   Juanito Doom, MD (Physician)    Pulmonary nodules: We will call you when we hear the results of next week CT scan  COPD: Continue Advair and Incruse Continue albuterol as needed Use hypertonic saline 1-2 times a day to help clear mucus out Continue Mucinex  Chronic respiratory failure with hypoxemia: Keep using oxygen as you are doing  Get a high-dose flu shot when they are available Practice good hand hygiene Stay active  We will see you back in 6 months or sooner if needed     Was appointment offered to patient (explain)?  Yes, she has transportation issues. She will all back today with appt information if it can be this afternoon or tomorrow with NP  Reason for call: Pt states she feels she has sinus infection beginning about 2 days ago. She is using nettie pot daily. Pt has increase productive cough-green, SOB, wheezing, and sinus pressure. She would like to come in office for ov with NP today, but is having transportation issues. She will call back later today with appt details.  Awaiting patient to call back to schedule NP ov for acute ov for sinus infection.

## 2018-08-10 NOTE — Telephone Encounter (Signed)
I called and spoke with the patient she feels she can not make it to an appointment at all she feel too bad to come out and would like abx sent in.

## 2018-08-10 NOTE — Telephone Encounter (Signed)
Attempted to call pt to see if we could get an appt scheduled for her based on her symptoms but unable to reach pt. Left a detailed message for pt stating that we need to get her scheduled for an appt if she was able and if not to tell front staff that she was unable to come in for an appt and then they would send message back to triage in regards to this so we can go from there to see what other interventions could be done to help out with symptoms.  Will await a return call from pt to see if we could get her scheduled for an appt.

## 2018-08-10 NOTE — Telephone Encounter (Signed)
Orders have been placed for doxy and also for pred 20mg  since it was stated for pt to take 2 tabs of 10mg , that way pt could just take 1 tab instead of having to take the two tabs of pred daily.  Called and spoke with pt letting her know this had been done. Pt expressed understanding. Pt does have upcoming f/u appt next week 3/10 and stated to pt to make sure she keeps that appt. Nothing further needed.

## 2018-08-10 NOTE — Telephone Encounter (Signed)
BM please advise. Thanks.  

## 2018-08-10 NOTE — Telephone Encounter (Signed)
Patient is more than welcome to come in for an office visit.  If patient is unable to be seen in our office today or tomorrow then we could reconsider other interventions.I would attempt to call her again and encouraged her to come in for an office visit.  Wyn Quaker FNP

## 2018-08-10 NOTE — Telephone Encounter (Signed)
08/10/2018 1605   I am sorry to hear the patient feels so poorly.   Can offer:  Doxycycline >>> 1 100 mg tablet every 12 hours for 7 days >>>take with food  >>>wear sunscreen   Prednisone 10mg  tablet  >>>Take 2 tablets (20 mg total) daily for the next 5 days >>> Take with food in the morning  Please place the order.   Please ensure the patient is scheduled for follow-up next week or the next 2 to 4 weeks as patient is postop follow-up.  Wyn Quaker, FNP

## 2018-08-10 NOTE — Telephone Encounter (Signed)
Patient returned phone call,  She is not able to come in for an appt.  Patient phone number is [phone].

## 2018-08-11 ENCOUNTER — Other Ambulatory Visit: Payer: Self-pay

## 2018-08-11 ENCOUNTER — Emergency Department (HOSPITAL_COMMUNITY): Payer: Medicare Other

## 2018-08-11 ENCOUNTER — Encounter (HOSPITAL_COMMUNITY): Payer: Self-pay | Admitting: Emergency Medicine

## 2018-08-11 ENCOUNTER — Inpatient Hospital Stay (HOSPITAL_COMMUNITY)
Admission: EM | Admit: 2018-08-11 | Discharge: 2018-09-03 | DRG: 190 | Disposition: A | Discharge status: Skilled Nursing Facility | Payer: Medicare Other | Attending: Family Medicine | Admitting: Family Medicine

## 2018-08-11 DIAGNOSIS — R0602 Shortness of breath: Secondary | ICD-10-CM | POA: Diagnosis not present

## 2018-08-11 DIAGNOSIS — R05 Cough: Secondary | ICD-10-CM | POA: Diagnosis not present

## 2018-08-11 DIAGNOSIS — R0689 Other abnormalities of breathing: Secondary | ICD-10-CM | POA: Diagnosis not present

## 2018-08-11 DIAGNOSIS — Z66 Do not resuscitate: Secondary | ICD-10-CM | POA: Diagnosis present

## 2018-08-11 DIAGNOSIS — B370 Candidal stomatitis: Secondary | ICD-10-CM | POA: Diagnosis present

## 2018-08-11 DIAGNOSIS — I10 Essential (primary) hypertension: Secondary | ICD-10-CM | POA: Diagnosis not present

## 2018-08-11 DIAGNOSIS — R5381 Other malaise: Secondary | ICD-10-CM

## 2018-08-11 DIAGNOSIS — J9622 Acute and chronic respiratory failure with hypercapnia: Secondary | ICD-10-CM | POA: Diagnosis not present

## 2018-08-11 DIAGNOSIS — R41841 Cognitive communication deficit: Secondary | ICD-10-CM | POA: Diagnosis not present

## 2018-08-11 DIAGNOSIS — Z515 Encounter for palliative care: Secondary | ICD-10-CM

## 2018-08-11 DIAGNOSIS — Z808 Family history of malignant neoplasm of other organs or systems: Secondary | ICD-10-CM

## 2018-08-11 DIAGNOSIS — Z833 Family history of diabetes mellitus: Secondary | ICD-10-CM

## 2018-08-11 DIAGNOSIS — J441 Chronic obstructive pulmonary disease with (acute) exacerbation: Secondary | ICD-10-CM | POA: Diagnosis present

## 2018-08-11 DIAGNOSIS — R1312 Dysphagia, oropharyngeal phase: Secondary | ICD-10-CM | POA: Diagnosis not present

## 2018-08-11 DIAGNOSIS — A419 Sepsis, unspecified organism: Secondary | ICD-10-CM | POA: Insufficient documentation

## 2018-08-11 DIAGNOSIS — Z7951 Long term (current) use of inhaled steroids: Secondary | ICD-10-CM | POA: Diagnosis not present

## 2018-08-11 DIAGNOSIS — E876 Hypokalemia: Secondary | ICD-10-CM | POA: Diagnosis not present

## 2018-08-11 DIAGNOSIS — D696 Thrombocytopenia, unspecified: Secondary | ICD-10-CM | POA: Diagnosis not present

## 2018-08-11 DIAGNOSIS — Z85820 Personal history of malignant melanoma of skin: Secondary | ICD-10-CM | POA: Diagnosis not present

## 2018-08-11 DIAGNOSIS — R0981 Nasal congestion: Secondary | ICD-10-CM | POA: Diagnosis present

## 2018-08-11 DIAGNOSIS — J9600 Acute respiratory failure, unspecified whether with hypoxia or hypercapnia: Secondary | ICD-10-CM | POA: Diagnosis not present

## 2018-08-11 DIAGNOSIS — F419 Anxiety disorder, unspecified: Secondary | ICD-10-CM | POA: Diagnosis present

## 2018-08-11 DIAGNOSIS — B001 Herpesviral vesicular dermatitis: Secondary | ICD-10-CM | POA: Diagnosis not present

## 2018-08-11 DIAGNOSIS — Z9981 Dependence on supplemental oxygen: Secondary | ICD-10-CM

## 2018-08-11 DIAGNOSIS — E872 Acidosis: Secondary | ICD-10-CM | POA: Diagnosis present

## 2018-08-11 DIAGNOSIS — Z8249 Family history of ischemic heart disease and other diseases of the circulatory system: Secondary | ICD-10-CM | POA: Diagnosis not present

## 2018-08-11 DIAGNOSIS — R0682 Tachypnea, not elsewhere classified: Secondary | ICD-10-CM | POA: Diagnosis not present

## 2018-08-11 DIAGNOSIS — M2550 Pain in unspecified joint: Secondary | ICD-10-CM | POA: Diagnosis not present

## 2018-08-11 DIAGNOSIS — Z8052 Family history of malignant neoplasm of bladder: Secondary | ICD-10-CM | POA: Diagnosis not present

## 2018-08-11 DIAGNOSIS — Z7952 Long term (current) use of systemic steroids: Secondary | ICD-10-CM

## 2018-08-11 DIAGNOSIS — R498 Other voice and resonance disorders: Secondary | ICD-10-CM | POA: Diagnosis not present

## 2018-08-11 DIAGNOSIS — K5900 Constipation, unspecified: Secondary | ICD-10-CM | POA: Diagnosis not present

## 2018-08-11 DIAGNOSIS — Z9989 Dependence on other enabling machines and devices: Secondary | ICD-10-CM | POA: Diagnosis not present

## 2018-08-11 DIAGNOSIS — R278 Other lack of coordination: Secondary | ICD-10-CM | POA: Diagnosis not present

## 2018-08-11 DIAGNOSIS — R2689 Other abnormalities of gait and mobility: Secondary | ICD-10-CM | POA: Diagnosis not present

## 2018-08-11 DIAGNOSIS — J9621 Acute and chronic respiratory failure with hypoxia: Secondary | ICD-10-CM | POA: Diagnosis present

## 2018-08-11 DIAGNOSIS — Z79899 Other long term (current) drug therapy: Secondary | ICD-10-CM

## 2018-08-11 DIAGNOSIS — J9611 Chronic respiratory failure with hypoxia: Secondary | ICD-10-CM | POA: Diagnosis not present

## 2018-08-11 DIAGNOSIS — J9601 Acute respiratory failure with hypoxia: Secondary | ICD-10-CM | POA: Diagnosis not present

## 2018-08-11 DIAGNOSIS — Z87891 Personal history of nicotine dependence: Secondary | ICD-10-CM

## 2018-08-11 DIAGNOSIS — R609 Edema, unspecified: Secondary | ICD-10-CM | POA: Diagnosis not present

## 2018-08-11 DIAGNOSIS — Z7189 Other specified counseling: Secondary | ICD-10-CM | POA: Diagnosis not present

## 2018-08-11 DIAGNOSIS — F418 Other specified anxiety disorders: Secondary | ICD-10-CM | POA: Diagnosis present

## 2018-08-11 DIAGNOSIS — Z7401 Bed confinement status: Secondary | ICD-10-CM | POA: Diagnosis not present

## 2018-08-11 DIAGNOSIS — R2681 Unsteadiness on feet: Secondary | ICD-10-CM | POA: Diagnosis not present

## 2018-08-11 DIAGNOSIS — R000 Tachycardia, unspecified: Secondary | ICD-10-CM | POA: Diagnosis not present

## 2018-08-11 DIAGNOSIS — R0902 Hypoxemia: Secondary | ICD-10-CM | POA: Diagnosis not present

## 2018-08-11 DIAGNOSIS — J9602 Acute respiratory failure with hypercapnia: Secondary | ICD-10-CM | POA: Diagnosis not present

## 2018-08-11 DIAGNOSIS — M6281 Muscle weakness (generalized): Secondary | ICD-10-CM | POA: Diagnosis not present

## 2018-08-11 LAB — CBC WITH DIFFERENTIAL/PLATELET
Abs Immature Granulocytes: 0.05 10*3/uL (ref 0.00–0.07)
Basophils Absolute: 0 10*3/uL (ref 0.0–0.1)
Basophils Relative: 0 %
EOS ABS: 0 10*3/uL (ref 0.0–0.5)
Eosinophils Relative: 0 %
HCT: 44.1 % (ref 36.0–46.0)
Hemoglobin: 13.8 g/dL (ref 12.0–15.0)
Immature Granulocytes: 1 %
Lymphocytes Relative: 6 %
Lymphs Abs: 0.5 10*3/uL — ABNORMAL LOW (ref 0.7–4.0)
MCH: 30.8 pg (ref 26.0–34.0)
MCHC: 31.3 g/dL (ref 30.0–36.0)
MCV: 98.4 fL (ref 80.0–100.0)
Monocytes Absolute: 0.2 10*3/uL (ref 0.1–1.0)
Monocytes Relative: 2 %
Neutro Abs: 7.5 10*3/uL (ref 1.7–7.7)
Neutrophils Relative %: 91 %
Platelets: 182 10*3/uL (ref 150–400)
RBC: 4.48 MIL/uL (ref 3.87–5.11)
RDW: 12.9 % (ref 11.5–15.5)
WBC: 8.2 10*3/uL (ref 4.0–10.5)
nRBC: 0 % (ref 0.0–0.2)

## 2018-08-11 LAB — COMPREHENSIVE METABOLIC PANEL
ALT: 21 U/L (ref 0–44)
AST: 26 U/L (ref 15–41)
Albumin: 4.6 g/dL (ref 3.5–5.0)
Alkaline Phosphatase: 115 U/L (ref 38–126)
Anion gap: 9 (ref 5–15)
BUN: 11 mg/dL (ref 8–23)
CO2: 28 mmol/L (ref 22–32)
Calcium: 9.1 mg/dL (ref 8.9–10.3)
Chloride: 99 mmol/L (ref 98–111)
Creatinine, Ser: 0.7 mg/dL (ref 0.44–1.00)
GFR calc non Af Amer: >60 mL/min (ref >60)
Glucose, Bld: 123 mg/dL — ABNORMAL HIGH (ref 70–99)
Potassium: 3.6 mmol/L (ref 3.5–5.1)
Sodium: 136 mmol/L (ref 135–145)
Total Bilirubin: 0.7 mg/dL (ref 0.3–1.2)
Total Protein: 7.7 g/dL (ref 6.5–8.1)

## 2018-08-11 LAB — BLOOD GAS, VENOUS
Acid-Base Excess: 1.6 mmol/L (ref 0.0–2.0)
BICARBONATE: 26.4 mmol/L (ref 20.0–28.0)
O2 Content: 4 L/min
O2 Saturation: 96.8 %
Patient temperature: 98.5
pCO2, Ven: 44.3 mmHg (ref 44.0–60.0)
pH, Ven: 7.393 (ref 7.250–7.430)
pO2, Ven: 90.6 mmHg — ABNORMAL HIGH (ref 32.0–45.0)

## 2018-08-11 LAB — INFLUENZA PANEL BY PCR (TYPE A & B)
Influenza A By PCR: NEGATIVE
Influenza B By PCR: NEGATIVE

## 2018-08-11 LAB — BRAIN NATRIURETIC PEPTIDE: B Natriuretic Peptide: 111.7 pg/mL — ABNORMAL HIGH (ref 0.0–100.0)

## 2018-08-11 LAB — TROPONIN I: Troponin I: 0.05 ng/mL (ref <0.03)

## 2018-08-11 LAB — LACTIC ACID, PLASMA: Lactic Acid, Venous: 1.4 mmol/L (ref 0.5–1.9)

## 2018-08-11 MED ORDER — ALBUTEROL SULFATE (2.5 MG/3ML) 0.083% IN NEBU
2.5000 mg | INHALATION_SOLUTION | Freq: Four times a day (QID) | RESPIRATORY_TRACT | Status: DC
  Administered 2018-08-12 – 2018-08-13 (×5): 2.5 mg via RESPIRATORY_TRACT
  Filled 2018-08-11 (×7): qty 3

## 2018-08-11 MED ORDER — SODIUM CHLORIDE 3 % IN NEBU
4.0000 mL | INHALATION_SOLUTION | Freq: Two times a day (BID) | RESPIRATORY_TRACT | Status: DC | PRN
  Filled 2018-08-11: qty 4

## 2018-08-11 MED ORDER — ALBUTEROL SULFATE (2.5 MG/3ML) 0.083% IN NEBU
2.5000 mg | INHALATION_SOLUTION | RESPIRATORY_TRACT | Status: DC | PRN
  Administered 2018-08-11 – 2018-08-13 (×4): 2.5 mg via RESPIRATORY_TRACT
  Filled 2018-08-11 (×3): qty 3

## 2018-08-11 MED ORDER — OXYMETAZOLINE HCL 0.05 % NA SOLN
1.0000 | Freq: Two times a day (BID) | NASAL | Status: DC | PRN
  Administered 2018-08-14 – 2018-08-16 (×2): 1 via NASAL
  Filled 2018-08-11: qty 15

## 2018-08-11 MED ORDER — FLUTICASONE FUROATE-VILANTEROL 200-25 MCG/INH IN AEPB
1.0000 | INHALATION_SPRAY | Freq: Every day | RESPIRATORY_TRACT | Status: DC
  Administered 2018-08-12: 1 via RESPIRATORY_TRACT
  Filled 2018-08-11: qty 28

## 2018-08-11 MED ORDER — CALCIUM CARBONATE-VITAMIN D 500-200 MG-UNIT PO TABS
1.0000 | ORAL_TABLET | Freq: Every day | ORAL | Status: DC
  Administered 2018-08-12 – 2018-09-03 (×22): 1 via ORAL
  Filled 2018-08-11 (×22): qty 1

## 2018-08-11 MED ORDER — SODIUM CHLORIDE 0.45 % IV SOLN
INTRAVENOUS | Status: DC
  Administered 2018-08-11 – 2018-08-12 (×2): via INTRAVENOUS

## 2018-08-11 MED ORDER — ACETAMINOPHEN 325 MG PO TABS
650.0000 mg | ORAL_TABLET | Freq: Once | ORAL | Status: AC
  Administered 2018-08-11: 650 mg via ORAL
  Filled 2018-08-11: qty 2

## 2018-08-11 MED ORDER — CITALOPRAM HYDROBROMIDE 20 MG PO TABS
10.0000 mg | ORAL_TABLET | Freq: Every day | ORAL | Status: DC
  Administered 2018-08-11 – 2018-09-02 (×23): 10 mg via ORAL
  Filled 2018-08-11 (×23): qty 1

## 2018-08-11 MED ORDER — MONTELUKAST SODIUM 10 MG PO TABS
10.0000 mg | ORAL_TABLET | Freq: Every day | ORAL | Status: DC
  Administered 2018-08-11 – 2018-09-02 (×23): 10 mg via ORAL
  Filled 2018-08-11 (×24): qty 1

## 2018-08-11 MED ORDER — UMECLIDINIUM BROMIDE 62.5 MCG/INH IN AEPB
1.0000 | INHALATION_SPRAY | Freq: Every day | RESPIRATORY_TRACT | Status: DC
  Administered 2018-08-12: 1 via RESPIRATORY_TRACT
  Filled 2018-08-11: qty 7

## 2018-08-11 MED ORDER — GUAIFENESIN ER 600 MG PO TB12
1200.0000 mg | ORAL_TABLET | Freq: Two times a day (BID) | ORAL | Status: DC
  Administered 2018-08-11 – 2018-09-03 (×44): 1200 mg via ORAL
  Filled 2018-08-11 (×45): qty 2

## 2018-08-11 MED ORDER — MAGNESIUM SULFATE 2 GM/50ML IV SOLN
2.0000 g | Freq: Once | INTRAVENOUS | Status: AC
  Administered 2018-08-11: 2 g via INTRAVENOUS
  Filled 2018-08-11: qty 50

## 2018-08-11 MED ORDER — SODIUM CHLORIDE 0.9 % IV SOLN
1.0000 g | Freq: Once | INTRAVENOUS | Status: AC
  Administered 2018-08-11: 1 g via INTRAVENOUS
  Filled 2018-08-11: qty 10

## 2018-08-11 MED ORDER — ENOXAPARIN SODIUM 40 MG/0.4ML ~~LOC~~ SOLN
40.0000 mg | Freq: Every day | SUBCUTANEOUS | Status: DC
  Administered 2018-08-11 – 2018-09-02 (×23): 40 mg via SUBCUTANEOUS
  Filled 2018-08-11 (×22): qty 0.4

## 2018-08-11 MED ORDER — METHYLPREDNISOLONE SODIUM SUCC 125 MG IJ SOLR
125.0000 mg | Freq: Four times a day (QID) | INTRAMUSCULAR | Status: DC
  Administered 2018-08-11 – 2018-08-12 (×2): 125 mg via INTRAVENOUS
  Filled 2018-08-11 (×2): qty 2

## 2018-08-11 MED ORDER — SODIUM CHLORIDE 0.9 % IV SOLN: 1.0000 g | INTRAVENOUS | Status: DC

## 2018-08-11 MED ORDER — AZITHROMYCIN 250 MG PO TABS
500.0000 mg | ORAL_TABLET | Freq: Once | ORAL | Status: AC
  Administered 2018-08-11: 500 mg via ORAL
  Filled 2018-08-11: qty 2

## 2018-08-11 MED ORDER — ALBUTEROL SULFATE HFA 108 (90 BASE) MCG/ACT IN AERS: 2.0000 | INHALATION_SPRAY | Freq: Four times a day (QID) | RESPIRATORY_TRACT | Status: DC | PRN

## 2018-08-11 NOTE — ED Notes (Signed)
ED TO INPATIENT HANDOFF REPORT  ED Nurse Name and Phone #: Kerin Ransom, [phone]  S Name/Age/Gender Lenon Oms 73 y.o. female Room/Bed: WA07/WA07  Code Status   Code Status: Prior  Home/SNF/Other Home Patient oriented to: self, place, time and situation Is this baseline? Yes   Triage Complete: Triage complete  Chief Complaint SOB   Triage Note Per EMS: PT c/o SOB. Pt reports worsening over last 48 hours. COPD hx. Pt was diagnosed with a URI by her PCP. Pt is on her second treatment with 10 of albuterol and 0.5 atrovent.    Allergies No Known Allergies  Level of Care/Admitting Diagnosis ED Disposition    ED Disposition Condition Comment   Admit  Hospital Area: [facility] [[id number]]  Level of Care: Med-Surg [16]  Diagnosis: COPD with acute exacerbation [facility]) [[id number]]  Admitting Physician: Elwyn Reach [[id number]]  Attending Physician: Elwyn Reach [[id number]]  Estimated length of stay: past midnight tomorrow  Certification:: I certify this patient will need inpatient services for at least 2 midnights  PT Class (Do Not Modify): Inpatient [101]  PT Acc Code (Do Not Modify): Private [1]       B Medical/Surgery History Past Medical History:  Diagnosis Date  . Adenomatous colon polyp   . Allergic rhinitis, cause unspecified    seasonal  . COPD (chronic obstructive pulmonary disease) ([place])    no home O2  . Depressive disorder, not elsewhere classified 2006   some anxiety recurs when she has stopped med in past  . OA (osteoarthritis)    B THUMB, RIGHT > LEFT HIP, BACK  . Osteopenia    Past Surgical History:  Procedure Laterality Date  . BREAST ENHANCEMENT SURGERY  1978   implants placed '78, removed and replaced  approx '95 (Dr. Wendy Poet)  . COLONOSCOPY  08/2011  . COSMETIC SURGERY     LIPOSUCTION; other facial cosmetic surgery  . GANGLION CYST EXCISION    . [place] HERNIA REPAIR  2004   right, and ganglion cyst removal  . LIPOMA  EXCISION    . MELANOMA EXCISION  2013   R upper arm  . NASAL SEPTUM SURGERY  1987   for deviated septum (Dr. Wendy Poet)  . TONSILLECTOMY AND ADENOIDECTOMY  1951  . VIDEO BRONCHOSCOPY Bilateral 08/08/2015   Procedure: VIDEO BRONCHOSCOPY WITHOUT FLUORO;  Surgeon: Juanito Doom, MD;  Location: [facility] ENDOSCOPY;  Service: Cardiopulmonary;  Laterality: Bilateral;     A IV Location/Drains/Wounds Patient Lines/Drains/Airways Status   Active Line/Drains/Airways    Name:   Placement date:   Placement time:   Site:   Days:   Peripheral IV 08/11/18 Left Wrist   08/11/18    1906    Wrist   less than 1   Peripheral IV 08/11/18 Right Forearm   08/11/18    2001    Forearm   less than 1          Intake/Output Last 24 hours No intake or output data in the 24 hours ending 08/11/18 2145  Labs/Imaging Results for orders placed or performed during the hospital encounter of 08/11/18 (from the past 48 hour(s))  Comprehensive metabolic panel     Status: Abnormal   Collection Time: 08/11/18  5:28 PM  Result Value Ref Range   Sodium 136 135 - 145 mmol/L   Potassium 3.6 3.5 - 5.1 mmol/L   Chloride 99 98 - 111 mmol/L   CO2 28 22 - 32 mmol/L   Glucose, Bld 123 (H)  70 - 99 mg/dL   BUN 11 8 - 23 mg/dL   Creatinine, Ser 0.70 0.44 - 1.00 mg/dL   Calcium 9.1 8.9 - 10.3 mg/dL   Total Protein 7.7 6.5 - 8.1 g/dL   Albumin 4.6 3.5 - 5.0 g/dL   AST 26 15 - 41 U/L   ALT 21 0 - 44 U/L   Alkaline Phosphatase 115 38 - 126 U/L   Total Bilirubin 0.7 0.3 - 1.2 mg/dL   GFR calc non Af Amer >60 >60 mL/min   GFR calc Af Amer >60 >60 mL/min   Anion gap 9 5 - 15    Comment: Performed at [facility], [place] [address] [place], [place] [address]  CBC with Differential     Status: Abnormal   Collection Time: 08/11/18  5:28 PM  Result Value Ref Range   WBC 8.2 4.0 - 10.5 K/uL   RBC 4.48 3.87 - 5.11 MIL/uL   Hemoglobin 13.8 12.0 - 15.0 g/dL   HCT 44.1 36.0 - 46.0 %   MCV 98.4 80.0 - 100.0 fL   MCH  30.8 26.0 - 34.0 pg   MCHC 31.3 30.0 - 36.0 g/dL   RDW 12.9 11.5 - 15.5 %   Platelets 182 150 - 400 K/uL   nRBC 0.0 0.0 - 0.2 %   Neutrophils Relative % 91 %   Neutro Abs 7.5 1.7 - 7.7 K/uL   Lymphocytes Relative 6 %   Lymphs Abs 0.5 (L) 0.7 - 4.0 K/uL   Monocytes Relative 2 %   Monocytes Absolute 0.2 0.1 - 1.0 K/uL   Eosinophils Relative 0 %   Eosinophils Absolute 0.0 0.0 - 0.5 K/uL   Basophils Relative 0 %   Basophils Absolute 0.0 0.0 - 0.1 K/uL   Immature Granulocytes 1 %   Abs Immature Granulocytes 0.05 0.00 - 0.07 K/uL    Comment: Performed at [facility], [place] [address] [place], [place] [address]  Troponin I - Once     Status: Abnormal   Collection Time: 08/11/18  5:28 PM  Result Value Ref Range   Troponin I 0.05 (HH) <0.03 ng/mL    Comment: CRITICAL RESULT CALLED TO, READ BACK BY AND VERIFIED WITH: C.THOMPSON AT 1827 ON 08/11/18 BY N.THOMPSON Performed at [facility], [place] [address], [place], [place] [address]   Brain natriuretic peptide     Status: Abnormal   Collection Time: 08/11/18  5:29 PM  Result Value Ref Range   B Natriuretic Peptide 111.7 (H) 0.0 - 100.0 pg/mL    Comment: Performed at [facility], [place] [address] [place], [place] [address]  Lactic acid, plasma     Status: None   Collection Time: 08/11/18  7:44 PM  Result Value Ref Range   Lactic Acid, Venous 1.4 0.5 - 1.9 mmol/L    Comment: Performed at [facility], [place] [address], [place], [place] [address]  Blood gas, venous     Status: Abnormal   Collection Time: 08/11/18  8:07 PM  Result Value Ref Range   O2 Content 4.0 L/min   Delivery systems NASAL CANNULA    pH, Ven 7.393 7.250 - 7.430   pCO2, Ven 44.3 44.0 - 60.0 mmHg   pO2, Ven 90.6 (H) 32.0 - 45.0 mmHg   Bicarbonate 26.4 20.0 - 28.0 mmol/L   Acid-Base Excess 1.6 0.0 - 2.0 mmol/L   O2 Saturation 96.8 %   Patient temperature 98.5  Collection site VEIN    Drawn by  DRAWN BY RN    Sample type VENOUS     Comment: Performed at [place] [address] [place], [place] [address]  Influenza panel by PCR (type A & B)     Status: None   Collection Time: 08/11/18  8:14 PM  Result Value Ref Range   Influenza A By PCR NEGATIVE NEGATIVE   Influenza B By PCR NEGATIVE NEGATIVE    Comment: (NOTE) The Xpert Xpress Flu assay is intended as an aid in the diagnosis of  influenza and should not be used as a sole basis for treatment.  This  assay is FDA approved for nasopharyngeal swab specimens only. Nasal  washings and aspirates are unacceptable for Xpert Xpress Flu testing. Performed at [facility], [place] [address] [place], [place] [address]    Dg Chest 2 View  Result Date: 08/11/2018 CLINICAL DATA:  Worsening shortness of breath over the past 48 hours. COPD. Ex-smoker. Cough. EXAM: CHEST - 2 VIEW COMPARISON:  Chest CT dated 02/03/2018 and chest radiographs dated 06/04/2016. FINDINGS: Normal sized heart. Stable marked hyperexpansion of the lungs with bilateral bullous changes and linear scarring. No radiographically visible lung nodules, better seen on the previous CT. Unremarkable bones. One of the previously demonstrated breast implants is visible on the lateral view. IMPRESSION: No acute disease. Stable marked changes of COPD. Electronically Signed   By: Claudie Revering M.D.   On: 08/11/2018 18:51    Pending Labs Unresulted Labs (From admission, onward)    Start     Ordered   08/11/18 1917  Lactic acid, plasma  Now then every 2 hours,   STAT     08/11/18 1916   08/11/18 1917  Culture, blood (routine x 2)  BLOOD CULTURE X 2,   STAT     08/11/18 1916          Vitals/Pain Today's Vitals   08/11/18 1933 08/11/18 2030 08/11/18 2126 08/11/18 2126  BP:   111/76   Pulse:  99 99   Resp:   20   Temp: (!) 101.1 F (38.4 C)     TempSrc: Rectal     SpO2:  98% 99%   Weight:      Height:      PainSc:    0-No pain     Isolation Precautions Droplet precaution  Medications Medications  cefTRIAXone (ROCEPHIN) 1 g in sodium chloride 0.9 % 100 mL IVPB (1 g Intravenous New Bag/Given 08/11/18 2125)  azithromycin (ZITHROMAX) tablet 500 mg (has no administration in time range)  magnesium sulfate IVPB 2 g 50 mL (0 g Intravenous Stopped 08/11/18 2057)  acetaminophen (TYLENOL) tablet 650 mg (650 mg Oral Given 08/11/18 2007)    Mobility walks Low fall risk   Focused Assessments Pulmonary Assessment Handoff:  Lung sounds: Bilateral Breath Sounds: Diminished, Rhonchi L Breath Sounds: Diminished R Breath Sounds: Diminished O2 Device: Nasal Cannula O2 Flow Rate (L/min): 5 L/min      R Recommendations: See Admitting Provider Note  Report given to: Fabio Bering, RN  Additional Notes: n/a

## 2018-08-11 NOTE — ED Notes (Signed)
Pt's sister, Marabella Popiel, [phone].  Pt has given verbal permission to update pt's sister about pt's condition.  Code Word: Elby Beck

## 2018-08-11 NOTE — ED Triage Notes (Signed)
Per EMS: PT c/o SOB. Pt reports worsening over last 48 hours. COPD hx. Pt was diagnosed with a URI by her PCP. Pt is on her second treatment with 10 of albuterol and 0.5 atrovent.

## 2018-08-11 NOTE — ED Notes (Signed)
Bed: WA07 Expected date:  Expected time:  Means of arrival:  Comments: EMS-SOB 

## 2018-08-11 NOTE — ED Notes (Addendum)
CRITICAL VALUE ALERT  Critical Value:  Troponin 0.05 ng/mL  Date & Time Notied:  08/11/2018 1827  Provider Notified: Ward PA  Orders Received/Actions taken: PA aware

## 2018-08-11 NOTE — H&P (Signed)
History and Physical   ERCEL NORMOYLE ZOX:[id number] DOB: 1946-02-14 DOA: 08/11/2018  Referring MD/NP/PA: Dr. Eulis Foster  PCP: [name], MD   Patient coming from: Home  Chief Complaint: Shortness of breath  HPI: Courtney Ayers is a 73 y.o. female with medical history significant of COPD, depression, allergic rhinitis, hypertension, who presented with significant shortness of breath and cough.  Symptoms started about a week ago and progressively getting worse.Patient follows up with pulmonology.  She has been on multiple pulmonary medications that seem to have worked for her until now.  She was feeling weak.  She took doxycycline also but get some stomach upset.  She felt congested with her sinuses and try to treat this symptomatically.  She continues to have shortness of breath.  She decided to come to the emergency room where she was having significant tachypnea with a temperature of 101.  No pneumonia on x-ray.  She was however having bilateral wheezing consistent with acute exacerbation of COPD.  Viral screen so far negative.  Patient has been admitted with acute exacerbation of COPD.[name]  ED Course: Temperature 101.1 blood pressure 189/82, pulse of 116, respirate 39 oxygen sat 92% on 2 L.  Patient CBC and chemistry appeared to be within normal except for troponin 0 0.05 glucose 123.  Chest x-ray showed no acute findings.  Influenza AMB negative.  Patient being admitted with acute exacerbation COPD.  Review of Systems: As per HPI otherwise 10 point review of systems negative.    Past Medical History:  Diagnosis Date  . Adenomatous colon polyp   . Allergic rhinitis, cause unspecified    seasonal  . COPD (chronic obstructive pulmonary disease) ([place])    no home O2  . Depressive disorder, not elsewhere classified 2006   some anxiety recurs when she has stopped med in past  . OA (osteoarthritis)    B THUMB, RIGHT > LEFT HIP, BACK  . Osteopenia     Past Surgical History:  Procedure  Laterality Date  . BREAST ENHANCEMENT SURGERY  1978   implants placed '78, removed and replaced  approx '95 (Dr. Wendy Poet)  . COLONOSCOPY  08/2011  . COSMETIC SURGERY     LIPOSUCTION; other facial cosmetic surgery  . GANGLION CYST EXCISION    . [place] HERNIA REPAIR  2004   right, and ganglion cyst removal  . LIPOMA EXCISION    . MELANOMA EXCISION  2013   R upper arm  . NASAL SEPTUM SURGERY  1987   for deviated septum (Dr. Wendy Poet)  . TONSILLECTOMY AND ADENOIDECTOMY  1951  . VIDEO BRONCHOSCOPY Bilateral 08/08/2015   Procedure: VIDEO BRONCHOSCOPY WITHOUT FLUORO;  Surgeon: Juanito Doom, MD;  Location: [facility] ENDOSCOPY;  Service: Cardiopulmonary;  Laterality: Bilateral;     reports that she quit smoking about 4 years ago. Her smoking use included cigarettes. She has a 45.00 pack-year smoking history. She has never used smokeless tobacco. She reports current alcohol use. She reports that she does not use drugs.  No Known Allergies  Family History  Problem Relation Age of Onset  . Cancer Mother        bladder, metastatic  . Alcohol abuse Father   . Heart disease Father        CHF  . Hyperlipidemia Father   . Hypertension Father   . Aortic aneurysm Sister   . Crohn's disease Sister   . Hyperlipidemia Sister   . Cancer Sister        brain cancer in  her 30's  . Diabetes Maternal Uncle   . Cancer Maternal Grandmother 98       pancreatic  . Cancer Maternal Grandfather        bladder cancer in 45's  . Stroke Paternal Grandmother   . Heart disease Paternal Grandfather        MI later 29's, early 29's     Prior to Admission medications   Medication Sig Start Date End Date Taking? Authorizing Provider  albuterol (PROVENTIL HFA;VENTOLIN HFA) 108 (90 Base) MCG/ACT inhaler Inhale 2 puffs into the lungs every 6 (six) hours as needed for wheezing or shortness of breath. 07/16/18  Yes Juanito Doom, MD  albuterol (PROVENTIL) (2.5 MG/3ML) 0.083% nebulizer solution USE 1 VIAL VIA  NEBULIZER EVERY 2 HOURS AS NEEDED FOR SHORTNESS OF BREATH Patient taking differently: Take 2.5 mg by nebulization every 2 (two) hours as needed for wheezing or shortness of breath.  07/24/17  Yes Juanito Doom, MD  Calcium Carbonate-Vitamin D (CALCIUM-VITAMIN D) 500-200 MG-UNIT tablet Take 1 tablet by mouth daily.   Yes [provider]  citalopram (CELEXA) 10 MG tablet TAKE 1 TABLET EVERY DAY Patient taking differently: Take 10 mg by mouth at bedtime.  02/23/18  Yes [name], MD  doxycycline (VIBRA-TABS) 100 MG tablet Take 1 tablet (100 mg total) by mouth 2 (two) times daily. 08/10/18  Yes Lauraine Rinne, NP  Fluticasone-Salmeterol (ADVAIR DISKUS) 250-50 MCG/DOSE AEPB INHALE 1 PUFF INTO THE LUNGS EVERY 12 HOURS 07/16/18  Yes Juanito Doom, MD  guaiFENesin (MUCINEX) 600 MG 12 hr tablet Take 1,200 mg by mouth 2 (two) times daily.    Yes [provider]  montelukast (SINGULAIR) 10 MG tablet TAKE 1 TABLET BY MOUTH EVERYDAY AT BEDTIME Patient taking differently: Take 10 mg by mouth at bedtime.  11/17/17  Yes Juanito Doom, MD  oxymetazoline (AFRIN) 0.05 % nasal spray Place 1 spray into both nostrils 2 (two) times daily as needed for congestion.   Yes [provider]  sodium chloride HYPERTONIC 3 % nebulizer solution Take by nebulization 2 (two) times daily as needed for other. 01/28/18  Yes Juanito Doom, MD  umeclidinium bromide (INCRUSE ELLIPTA) 62.5 MCG/INH AEPB Inhale 1 puff into the lungs daily. 07/31/18  Yes Juanito Doom, MD  fluticasone (FLONASE) 50 MCG/ACT nasal spray Place 2 sprays into both nostrils daily. Patient not taking: Reported on 08/11/2018 08/12/14 12/30/16  Donita Brooks, NP  OXYGEN Inhale 2 L into the lungs.    [provider]  predniSONE (DELTASONE) 20 MG tablet Take 1 tablet (20 mg total) by mouth daily with breakfast. 08/10/18   Lauraine Rinne, NP    Physical Exam: Vitals:   08/11/18 1933 08/11/18 2030 08/11/18 2126 08/11/18  2224  BP:   111/76   Pulse:  99 99 100  Resp:   20 (!) 22  Temp: (!) 101.1 F (38.4 C)     TempSrc: Rectal     SpO2:  98% 99% 97%  Weight:      Height:          Constitutional: NAD, anxious, tachypneic, chronically ill looking on oxygen  Vitals:   08/11/18 1933 08/11/18 2030 08/11/18 2126 08/11/18 2224  BP:   111/76   Pulse:  99 99 100  Resp:   20 (!) 22  Temp: (!) 101.1 F (38.4 C)     TempSrc: Rectal     SpO2:  98% 99% 97%  Weight:  Height:       Eyes: PERRL, lids and conjunctivae normal ENMT: Mucous membranes are moist. Posterior pharynx clear of any exudate or lesions.Normal dentition.  Neck: normal, supple, no masses, no thyromegaly Respiratory: Tachypnea, widespread expiratory wheezing, use of extra muscle respiration, talking in short sentences cardiovascular: Regular rate and rhythm, no murmurs / rubs / gallops. No extremity edema. 2+ pedal pulses. No carotid bruits.  Abdomen: no tenderness, no masses palpated. No hepatosplenomegaly. Bowel sounds positive.  Musculoskeletal: no clubbing / cyanosis. No joint deformity upper and lower extremities. Good ROM, no contractures. Normal muscle tone.  Skin: no rashes, lesions, ulcers. No induration Neurologic: CN 2-12 grossly intact. Sensation intact, DTR normal. Strength 5/5 in all 4.  Psychiatric: Normal judgment and insight. Alert and oriented x 3. Normal mood.     Labs on Admission: I have personally reviewed following labs and imaging studies  CBC: Recent Labs  Lab 08/11/18 1728  WBC 8.2  NEUTROABS 7.5  HGB 13.8  HCT 44.1  MCV 98.4  PLT 403   Basic Metabolic Panel: Recent Labs  Lab 08/11/18 1728  NA 136  K 3.6  CL 99  CO2 28  GLUCOSE 123*  BUN 11  CREATININE 0.70  CALCIUM 9.1   GFR: Estimated Creatinine Clearance: 60.9 mL/min (by C-G formula based on SCr of 0.7 mg/dL). Liver Function Tests: Recent Labs  Lab 08/11/18 1728  AST 26  ALT 21  ALKPHOS 115  BILITOT 0.7  PROT 7.7  ALBUMIN  4.6   No results for input(s): LIPASE, AMYLASE in the last 168 hours. No results for input(s): AMMONIA in the last 168 hours. Coagulation Profile: No results for input(s): INR, PROTIME in the last 168 hours. Cardiac Enzymes: Recent Labs  Lab 08/11/18 1728  TROPONINI 0.05*   BNP (last 3 results) No results for input(s): PROBNP in the last 8760 hours. HbA1C: No results for input(s): HGBA1C in the last 72 hours. CBG: No results for input(s): GLUCAP in the last 168 hours. Lipid Profile: No results for input(s): CHOL, HDL, LDLCALC, TRIG, CHOLHDL, LDLDIRECT in the last 72 hours. Thyroid Function Tests: No results for input(s): TSH, T4TOTAL, FREET4, T3FREE, THYROIDAB in the last 72 hours. Anemia Panel: No results for input(s): VITAMINB12, FOLATE, FERRITIN, TIBC, IRON, RETICCTPCT in the last 72 hours. Urine analysis:    Component Value Date/Time   COLORURINE YELLOW 06/05/2016 0840   APPEARANCEUR CLEAR 06/05/2016 0840   LABSPEC 1.018 06/05/2016 0840   PHURINE 5.0 06/05/2016 0840   GLUCOSEU NEGATIVE 06/05/2016 0840   HGBUR SMALL (A) 06/05/2016 0840   BILIRUBINUR NEGATIVE 06/05/2016 0840   BILIRUBINUR neg 09/08/2012 1453   KETONESUR 5 (A) 06/05/2016 0840   PROTEINUR NEGATIVE 06/05/2016 0840   UROBILINOGEN negative 09/08/2012 1453   NITRITE NEGATIVE 06/05/2016 0840   LEUKOCYTESUR TRACE (A) 06/05/2016 0840   Sepsis Labs: @LABRCNTIP (procalcitonin:4,lacticidven:4) )No results found for this or any previous visit (from the past 240 hour(s)).   Radiological Exams on Admission: Dg Chest 2 View  Result Date: 08/11/2018 CLINICAL DATA:  Worsening shortness of breath over the past 48 hours. COPD. Ex-smoker. Cough. EXAM: CHEST - 2 VIEW COMPARISON:  Chest CT dated 02/03/2018 and chest radiographs dated 06/04/2016. FINDINGS: Normal sized heart. Stable marked hyperexpansion of the lungs with bilateral bullous changes and linear scarring. No radiographically visible lung nodules, better seen on  the previous CT. Unremarkable bones. One of the previously demonstrated breast implants is visible on the lateral view. IMPRESSION: No acute disease. Stable marked changes of COPD.  Electronically Signed   By: Claudie Revering M.D.   On: 08/11/2018 18:51    EKG: Independently reviewed.  Sinus tachycardia with a rate of 113, biatrial enlargement, nonspecific ST changes  Assessment/Plan Principal Problem:   COPD with acute exacerbation (HCC) Active Problems:   Chronic respiratory failure with hypoxia (HCC)   Sepsis (HCC)   HTN (hypertension)   Tachypnea     #1 acute on chronic respiratory failure with hypoxia: Secondary to COPD exacerbation.  Patient will be admitted to a monitored bed.  IV steroids with nebulizer and antibiotics.  We will follow the gold COPD protocol.  Continue oxygen.  #2 COPD exacerbation: We will continue with treatment as above.  #3 hypertension: Blood pressure appears controlled.  Continue treatment.  #4 fever with tachypnea: Chest x-ray showed no pneumonia.  No influenza positive.  Patient may well have early pneumonia not yet showing on x-ray.  Treated with Rocephin and Zithromax for now.  May repeat chest x-ray in the morning.  #5 anxiety with depression: Continue home regimen.    DVT prophylaxis: Lovenox Code Status: Full code Family Communication: No family at bedside discussed with patient Disposition Plan: Home Consults called: None Admission status: Inpatient  Severity of Illness: The appropriate patient status for this patient is INPATIENT. Inpatient status is judged to be reasonable and necessary in order to provide the required intensity of service to ensure the patient's safety. The patient's presenting symptoms, physical exam findings, and initial radiographic and laboratory data in the context of their chronic comorbidities is felt to place them at high risk for further clinical deterioration. Furthermore, it is not anticipated that the patient will  be medically stable for discharge from the hospital within 2 midnights of admission. The following factors support the patient status of inpatient.   " The patient's presenting symptoms include shortness of breath. " The worrisome physical exam findings include marked expiratory wheezing. " The initial radiographic and laboratory data are worrisome because of no pneumonia on x-ray. " The chronic co-morbidities include COPD.   * I certify that at the point of admission it is my clinical judgment that the patient will require inpatient hospital care spanning beyond 2 midnights from the point of admission due to high intensity of service, high risk for further deterioration and high frequency of surveillance required.Barbette Merino MD [facility] Pager [phone]  If 7PM-7AM, please contact night-coverage [email or web address] Password [email or web address]  08/11/2018, 10:40 PM

## 2018-08-11 NOTE — ED Provider Notes (Signed)
[facility] DEPT Provider Note   CSN: [id number] Arrival date & time: 08/11/18  1641    History   Chief Complaint Chief Complaint  Patient presents with  . Shortness of Breath    HPI Courtney Ayers is a 73 y.o. female.     The history is provided by the patient and medical records. No language interpreter was used.  Shortness of Breath  Associated symptoms: cough    Courtney Ayers is a 73 y.o. female  with a PMH of COPD who presents to the Emergency Department complaining of shortness of breath 2-3 days. Associated with dry cough. Feels like she needs to get something up, but could not. Also associated with sinus pressure. She called her pulmonologist who recommended that she come in to the office, but she felt too weak and gets too short of breath with ambulation, therefore he called in a prescription for doxycyline and prednisone. She took one dose of the Doxycyline, but it upset her stomach. She didn't take the prednisone because of the GI upset that she got with doxycyline. She tried Nedi-pot to help with her sinus congestion and did get "infection" out after that. She also nebulizer with little improvement. She did get 10mg  albuterol, 0.5 atrovent and 125 solumedrol en route by EMS. Does report feeling a little better after the meds by EMS. She does report lower extremity which has been present for about a year.    Past Medical History:  Diagnosis Date  . Adenomatous colon polyp   . Allergic rhinitis, cause unspecified    seasonal  . COPD (chronic obstructive pulmonary disease) ([place])    no home O2  . Depressive disorder, not elsewhere classified 2006   some anxiety recurs when she has stopped med in past  . OA (osteoarthritis)    B THUMB, RIGHT > LEFT HIP, BACK  . Osteopenia     Patient Active Problem List   Diagnosis Date Noted  . COPD with acute exacerbation ([place]) 08/11/2018  . COPD (chronic obstructive pulmonary disease) ([place])  06/04/2016  . Prolonged Q-T interval on ECG 06/04/2016  . Hyperglycemia 06/04/2016  . Chronic respiratory failure with hypoxia ([place]) 07/20/2015  . Multiple pulmonary nodules 08/26/2014  . History of colonic polyps 08/29/2011  . Diverticulosis of colon (without mention of hemorrhage) 08/29/2011  . Osteopenia 01/30/2011  . Dysthymia 01/30/2011  . Allergic rhinitis 01/30/2011    Past Surgical History:  Procedure Laterality Date  . BREAST ENHANCEMENT SURGERY  1978   implants placed '78, removed and replaced  approx '95 (Dr. Wendy Poet)  . COLONOSCOPY  08/2011  . COSMETIC SURGERY     LIPOSUCTION; other facial cosmetic surgery  . GANGLION CYST EXCISION    . [place] HERNIA REPAIR  2004   right, and ganglion cyst removal  . LIPOMA EXCISION    . MELANOMA EXCISION  2013   R upper arm  . NASAL SEPTUM SURGERY  1987   for deviated septum (Dr. Wendy Poet)  . TONSILLECTOMY AND ADENOIDECTOMY  1951  . VIDEO BRONCHOSCOPY Bilateral 08/08/2015   Procedure: VIDEO BRONCHOSCOPY WITHOUT FLUORO;  Surgeon: Juanito Doom, MD;  Location: [facility] ENDOSCOPY;  Service: Cardiopulmonary;  Laterality: Bilateral;     OB History    Gravida  0   Para  0   Term  0   Preterm  0   AB  0   Living  0     SAB  0   TAB  0  Ectopic  0   Multiple  0   Live Births               Home Medications    Prior to Admission medications   Medication Sig Start Date End Date Taking? Authorizing Provider  albuterol (PROVENTIL HFA;VENTOLIN HFA) 108 (90 Base) MCG/ACT inhaler Inhale 2 puffs into the lungs every 6 (six) hours as needed for wheezing or shortness of breath. 07/16/18  Yes Juanito Doom, MD  albuterol (PROVENTIL) (2.5 MG/3ML) 0.083% nebulizer solution USE 1 VIAL VIA NEBULIZER EVERY 2 HOURS AS NEEDED FOR SHORTNESS OF BREATH Patient taking differently: Take 2.5 mg by nebulization every 2 (two) hours as needed for wheezing or shortness of breath.  07/24/17  Yes Juanito Doom, MD  Calcium  Carbonate-Vitamin D (CALCIUM-VITAMIN D) 500-200 MG-UNIT tablet Take 1 tablet by mouth daily.   Yes [provider]  citalopram (CELEXA) 10 MG tablet TAKE 1 TABLET EVERY DAY Patient taking differently: Take 10 mg by mouth at bedtime.  02/23/18  Yes [name], MD  doxycycline (VIBRA-TABS) 100 MG tablet Take 1 tablet (100 mg total) by mouth 2 (two) times daily. 08/10/18  Yes Lauraine Rinne, NP  Fluticasone-Salmeterol (ADVAIR DISKUS) 250-50 MCG/DOSE AEPB INHALE 1 PUFF INTO THE LUNGS EVERY 12 HOURS 07/16/18  Yes Juanito Doom, MD  guaiFENesin (MUCINEX) 600 MG 12 hr tablet Take 1,200 mg by mouth 2 (two) times daily.    Yes [provider]  montelukast (SINGULAIR) 10 MG tablet TAKE 1 TABLET BY MOUTH EVERYDAY AT BEDTIME Patient taking differently: Take 10 mg by mouth at bedtime.  11/17/17  Yes Juanito Doom, MD  oxymetazoline (AFRIN) 0.05 % nasal spray Place 1 spray into both nostrils 2 (two) times daily as needed for congestion.   Yes [provider]  sodium chloride HYPERTONIC 3 % nebulizer solution Take by nebulization 2 (two) times daily as needed for other. 01/28/18  Yes Juanito Doom, MD  umeclidinium bromide (INCRUSE ELLIPTA) 62.5 MCG/INH AEPB Inhale 1 puff into the lungs daily. 07/31/18  Yes Juanito Doom, MD  fluticasone (FLONASE) 50 MCG/ACT nasal spray Place 2 sprays into both nostrils daily. Patient not taking: Reported on 08/11/2018 08/12/14 12/30/16  Donita Brooks, NP  OXYGEN Inhale 2 L into the lungs.    [provider]  predniSONE (DELTASONE) 20 MG tablet Take 1 tablet (20 mg total) by mouth daily with breakfast. 08/10/18   Lauraine Rinne, NP    Family History Family History  Problem Relation Age of Onset  . Cancer Mother        bladder, metastatic  . Alcohol abuse Father   . Heart disease Father        CHF  . Hyperlipidemia Father   . Hypertension Father   . Aortic aneurysm Sister   . Crohn's disease Sister   . Hyperlipidemia Sister    . Cancer Sister        brain cancer in her 80's  . Diabetes Maternal Uncle   . Cancer Maternal Grandmother 98       pancreatic  . Cancer Maternal Grandfather        bladder cancer in 5's  . Stroke Paternal Grandmother   . Heart disease Paternal Grandfather        MI later 62's, early 65's    Social History Social History   Tobacco Use  . Smoking status: Former Smoker    Packs/day: 1.00    Years:  45.00    Pack years: 45.00    Types: Cigarettes    Last attempt to quit: 08/05/2014    Years since quitting: 4.0  . Smokeless tobacco: Never Used  Substance Use Topics  . Alcohol use: Yes    Alcohol/week: 0.0 standard drinks    Comment: 2 drinks per night  . Drug use: No     Allergies   Patient has no known allergies.   Review of Systems Review of Systems  HENT: Positive for congestion, sinus pressure and sinus pain.   Respiratory: Positive for cough and shortness of breath.   All other systems reviewed and are negative.    Physical Exam Updated Vital Signs BP 136/68   Pulse 99   Temp (!) 101.1 F (38.4 C) (Rectal)   Resp (!) 32   Ht 5\' 7"  (1.702 m)   Wt 62.6 kg   LMP 06/10/1992   SpO2 98%   BMI 21.61 kg/m   Physical Exam Vitals signs and nursing note reviewed.  Constitutional:      General: She is not in acute distress.    Appearance: She is well-developed.  HENT:     Head: Normocephalic and atraumatic.  Neck:     Musculoskeletal: Neck supple.  Cardiovascular:     Heart sounds: Normal heart sounds. No murmur.     Comments: Tachycardic, but regular. Pulmonary:     Comments: Tachypneic.  No wheezes, rales or rhonchi appreciated. Abdominal:     General: There is no distension.     Palpations: Abdomen is soft.     Tenderness: There is no abdominal tenderness.  Musculoskeletal:     Right lower leg: Edema present.     Left lower leg: Edema present.  Skin:    General: Skin is warm and dry.  Neurological:     Mental Status: She is alert and oriented  to person, place, and time.      ED Treatments / Results  Labs (all labs ordered are listed, but only abnormal results are displayed) Labs Reviewed  COMPREHENSIVE METABOLIC PANEL - Abnormal; Notable for the following components:      Result Value   Glucose, Bld 123 (*)    All other components within normal limits  CBC WITH DIFFERENTIAL/PLATELET - Abnormal; Notable for the following components:   Lymphs Abs 0.5 (*)    All other components within normal limits  TROPONIN I - Abnormal; Notable for the following components:   Troponin I 0.05 (*)    All other components within normal limits  BRAIN NATRIURETIC PEPTIDE - Abnormal; Notable for the following components:   B Natriuretic Peptide 111.7 (*)    All other components within normal limits  BLOOD GAS, VENOUS - Abnormal; Notable for the following components:   pO2, Ven 90.6 (*)    All other components within normal limits  CULTURE, BLOOD (ROUTINE X 2)  CULTURE, BLOOD (ROUTINE X 2)  LACTIC ACID, PLASMA  INFLUENZA PANEL BY PCR (TYPE A & B)  LACTIC ACID, PLASMA    EKG EKG Interpretation  Date/Time:  Tuesday August 11 2018 17:33:40 EST Ventricular Rate:  113 PR Interval:    QRS Duration: 98 QT Interval:  335 QTC Calculation: 460 R Axis:   60 Text Interpretation:  Sinus tachycardia Multiform ventricular premature complexes Biatrial enlargement RSR' in V1 or V2, right VCD or RVH Borderline T abnormalities, anterior leads Since prior ECG, rate has increased Confirmed by Gareth Morgan [phone]) on 08/11/2018 8:02:30 PM   Radiology Dg  Chest 2 View  Result Date: 08/11/2018 CLINICAL DATA:  Worsening shortness of breath over the past 48 hours. COPD. Ex-smoker. Cough. EXAM: CHEST - 2 VIEW COMPARISON:  Chest CT dated 02/03/2018 and chest radiographs dated 06/04/2016. FINDINGS: Normal sized heart. Stable marked hyperexpansion of the lungs with bilateral bullous changes and linear scarring. No radiographically visible lung nodules, better  seen on the previous CT. Unremarkable bones. One of the previously demonstrated breast implants is visible on the lateral view. IMPRESSION: No acute disease. Stable marked changes of COPD. Electronically Signed   By: Claudie Revering M.D.   On: 08/11/2018 18:51    Procedures Procedures (including critical care time)  Medications Ordered in ED Medications  cefTRIAXone (ROCEPHIN) 1 g in sodium chloride 0.9 % 100 mL IVPB (has no administration in time range)  azithromycin (ZITHROMAX) tablet 500 mg (has no administration in time range)  magnesium sulfate IVPB 2 g 50 mL (0 g Intravenous Stopped 08/11/18 2057)  acetaminophen (TYLENOL) tablet 650 mg (650 mg Oral Given 08/11/18 2007)     Initial Impression / Assessment and Plan / ED Course  I have reviewed the triage vital signs and the nursing notes.  Pertinent labs & imaging results that were available during my care of the patient were reviewed by me and considered in my medical decision making (see chart for details).       Courtney Ayers is a 73 y.o. female who presents to ED for shortness of breath.  History of COPD and followed by pulmonology.  Was called in prescription for prednisone and doxycycline, however only took 1 dose of doxycycline and it upset her stomach, therefore did not continue treatment.  Patient febrile up to 101.1, tachycardic and tachypneic.  Fortunately, lactic and white count were normal.  Not hypotensive.  Given her tachypnea, tachycardia and fever, blood cultures were drawn.  Because she has no hypotension, normal lactic and white count along with mild elevation of her BNP, did not give fluid bolus.  Does not appear to be in septic shock.  X-ray without acute disease process.  Flu negative.  Given azithromycin and Rocephin for presumed COPD exacerbation.  Hospitalist consulted who will admit.  Patient seen by and discussed with Dr. Billy Fischer who agrees with treatment plan.   Final Clinical Impressions(s) / ED Diagnoses    Final diagnoses:  Shortness of breath  COPD exacerbation [facility])    ED Discharge Orders    None       Nathanel Tallman, Ozella Almond, PA-C 08/11/18 2114    Gareth Morgan, MD 08/14/18 1454

## 2018-08-12 DIAGNOSIS — J9611 Chronic respiratory failure with hypoxia: Secondary | ICD-10-CM

## 2018-08-12 DIAGNOSIS — R0682 Tachypnea, not elsewhere classified: Secondary | ICD-10-CM

## 2018-08-12 DIAGNOSIS — J441 Chronic obstructive pulmonary disease with (acute) exacerbation: Principal | ICD-10-CM

## 2018-08-12 LAB — CBC
HCT: 41.6 % (ref 36.0–46.0)
Hemoglobin: 13 g/dL (ref 12.0–15.0)
MCH: 31.1 pg (ref 26.0–34.0)
MCHC: 31.3 g/dL (ref 30.0–36.0)
MCV: 99.5 fL (ref 80.0–100.0)
Platelets: 185 10*3/uL (ref 150–400)
RBC: 4.18 MIL/uL (ref 3.87–5.11)
RDW: 13 % (ref 11.5–15.5)
WBC: 5.5 10*3/uL (ref 4.0–10.5)
nRBC: 0 % (ref 0.0–0.2)

## 2018-08-12 LAB — COMPREHENSIVE METABOLIC PANEL
ALT: 18 U/L (ref 0–44)
ANION GAP: 8 (ref 5–15)
AST: 23 U/L (ref 15–41)
Albumin: 3.9 g/dL (ref 3.5–5.0)
Alkaline Phosphatase: 97 U/L (ref 38–126)
BUN: 11 mg/dL (ref 8–23)
CO2: 29 mmol/L (ref 22–32)
Calcium: 9.1 mg/dL (ref 8.9–10.3)
Chloride: 104 mmol/L (ref 98–111)
Creatinine, Ser: 0.68 mg/dL (ref 0.44–1.00)
GFR calc Af Amer: >60 mL/min (ref >60)
Glucose, Bld: 158 mg/dL — ABNORMAL HIGH (ref 70–99)
Potassium: 4.5 mmol/L (ref 3.5–5.1)
Sodium: 141 mmol/L (ref 135–145)
Total Bilirubin: 0.5 mg/dL (ref 0.3–1.2)
Total Protein: 6.7 g/dL (ref 6.5–8.1)

## 2018-08-12 MED ORDER — SODIUM CHLORIDE 0.9 % IV SOLN
1.0000 g | INTRAVENOUS | Status: DC
  Administered 2018-08-12: 1 g via INTRAVENOUS
  Filled 2018-08-12: qty 10
  Filled 2018-08-12: qty 1

## 2018-08-12 MED ORDER — METHYLPREDNISOLONE SODIUM SUCC 125 MG IJ SOLR
60.0000 mg | Freq: Four times a day (QID) | INTRAMUSCULAR | Status: DC
  Administered 2018-08-12 – 2018-08-16 (×16): 60 mg via INTRAVENOUS
  Filled 2018-08-12 (×16): qty 2

## 2018-08-12 MED ORDER — ARFORMOTEROL TARTRATE 15 MCG/2ML IN NEBU
15.0000 ug | INHALATION_SOLUTION | Freq: Two times a day (BID) | RESPIRATORY_TRACT | Status: DC
  Administered 2018-08-12 – 2018-09-03 (×44): 15 ug via RESPIRATORY_TRACT
  Filled 2018-08-12 (×47): qty 2

## 2018-08-12 MED ORDER — ALPRAZOLAM 0.5 MG PO TABS
0.5000 mg | ORAL_TABLET | Freq: Three times a day (TID) | ORAL | Status: DC | PRN
  Administered 2018-08-12 – 2018-09-03 (×42): 0.5 mg via ORAL
  Filled 2018-08-12 (×44): qty 1

## 2018-08-12 MED ORDER — BUDESONIDE 0.5 MG/2ML IN SUSP
0.5000 mg | Freq: Two times a day (BID) | RESPIRATORY_TRACT | Status: DC
  Administered 2018-08-12 – 2018-09-03 (×44): 0.5 mg via RESPIRATORY_TRACT
  Filled 2018-08-12 (×44): qty 2

## 2018-08-12 NOTE — Consult Note (Signed)
NAME:  Courtney Ayers, MRN:  [id number], DOB:  [birth year], LOS: 1 ADMISSION DATE:  08/11/2018, CONSULTATION DATE: August 12, 2018 REFERRING MD: Dr. Bonner Puna, CHIEF COMPLAINT: Dyspnea  Brief History   73 year old female who sees Dr. Lake Bells in clinic for advanced COPD, 3 L oxygen baseline presented to [facility] long hospital with several days of chest congestion mucus production shortness of breath.  Has been treated for a COPD exacerbation.  Pulmonary consulted for ongoing dyspnea.  History of present illness   This is a pleasant 73 year old female whom I know quite well from the office for her advanced COPD who returned to [facility] long hospital on August 11, 2018 complaining of several days of worsening shortness of breath.  She had been prescribed prednisone and an antibiotic by our office prior to admission but she said that this did not help.  She started to feel increasingly short of breath at home and lightheaded.  She had also experienced some increased leg swelling over the last several days.  She denied any fevers or chills but she says she was coughing up brown mucus.  Sometimes she will see streaks of blood and mucus.  She denies any sick contacts.  She had been using her oxygen at baseline 3 L, but had increased it to 5 L because of worsening shortness of breath.  She had been taking her bronchodilators at home recently.  She has not smoked in years.  Past Medical History  COPD Chronic respiratory failure with hypoxemia Depression  Significant Hospital Events     Consults:  Pulmonary  Procedures:    Significant Diagnostic Tests:    Micro Data:  3/3 flu swab negative  Antimicrobials:  3/3 ceftriaxone Interim history/subjective:    Objective   Blood pressure 124/74, pulse 99, temperature 97.8 F (36.6 C), temperature source Oral, resp. rate 20, height 5\' 7"  (1.702 m), weight 62.6 kg, last menstrual period 06/10/1992, SpO2 100 %.        Intake/Output Summary (Last 24 hours)  at 08/12/2018 1504 Last data filed at 08/12/2018 1013 Gross per 24 hour  Intake 1105 ml  Output 1050 ml  Net 55 ml   Filed Weights   08/11/18 1711  Weight: 62.6 kg    Examination:  Gen: chronically ill appearing HENT: OP clear, TM's clear, neck supple PULM: Poor air movement, some wheezing B, normal percussion CV: RRR, no mgr, trace edema GI: BS+, soft, nontender Derm: no cyanosis or rash Psyche: normal mood and affect   Resolved Hospital Problem list     Assessment & Plan:  73 year old female admitted for acute on chronic respiratory failure with hypoxemia due to a COPD exacerbation.  Appears to be due to infectious etiology, no recent travel to suspect coronavirus.  COPD exacerbation: Continue Solu-Medrol as ordered Continue albuterol every 4 hours and as needed Stop [employer] Pulmicort Add back ceftriaxone  Acute respiratory failure with hypoxemia: Titrate O2 to maintain O2 saturation between 88 and 92%  Advance diet, increased activity on March 5  Pulmonary and critical care medicine will follow  Best practice:  Per [facility]   Labs   CBC: Recent Labs  Lab 08/11/18 1728 08/12/18 0454  WBC 8.2 5.5  NEUTROABS 7.5  --   HGB 13.8 13.0  HCT 44.1 41.6  MCV 98.4 99.5  PLT 182 585    Basic Metabolic Panel: Recent Labs  Lab 08/11/18 1728 08/12/18 0454  NA 136 141  K 3.6 4.5  CL 99 104  CO2 28 29  GLUCOSE 123* 158*  BUN 11 11  CREATININE 0.70 0.68  CALCIUM 9.1 9.1   GFR: Estimated Creatinine Clearance: 60.9 mL/min (by C-G formula based on SCr of 0.68 mg/dL). Recent Labs  Lab 08/11/18 1728 08/11/18 1944 08/12/18 0454  WBC 8.2  --  5.5  LATICACIDVEN  --  1.4  --     Liver Function Tests: Recent Labs  Lab 08/11/18 1728 08/12/18 0454  AST 26 23  ALT 21 18  ALKPHOS 115 97  BILITOT 0.7 0.5  PROT 7.7 6.7  ALBUMIN 4.6 3.9   No results for input(s): LIPASE, AMYLASE in the last 168 hours. No results for input(s): AMMONIA in the  last 168 hours.  ABG    Component Value Date/Time   PHART 7.324 (L) 08/07/2014 2335   PCO2ART 69.6 (HH) 08/07/2014 2335   PO2ART 176.0 (H) 08/07/2014 2335   HCO3 26.4 08/11/2018 2007   TCO2 37.3 08/07/2014 2335   O2SAT 96.8 08/11/2018 2007     Coagulation Profile: No results for input(s): INR, PROTIME in the last 168 hours.  Cardiac Enzymes: Recent Labs  Lab 08/11/18 1728  TROPONINI 0.05*    HbA1C: Hgb A1c MFr Bld  Date/Time Value Ref Range Status  07/01/2016 02:04 PM 5.1 <5.7 % Final    Comment:      For the purpose of screening for the presence of diabetes:   <5.7%       Consistent with the absence of diabetes 5.7-6.4 %   Consistent with increased risk for diabetes (prediabetes) >=6.5 %     Consistent with diabetes   This assay result is consistent with a decreased risk of diabetes.   Currently, no consensus exists regarding use of hemoglobin A1c for diagnosis of diabetes in children.   According to American Diabetes Association (ADA) guidelines, hemoglobin A1c <7.0% represents optimal control in non-pregnant diabetic patients. Different metrics may apply to specific patient populations. Standards of Medical Care in Diabetes (ADA).     06/04/2016 08:26 PM 5.2 4.8 - 5.6 % Final    Comment:    (NOTE)         Pre-diabetes: 5.7 - 6.4         Diabetes: >6.4         Glycemic control for adults with diabetes: <7.0     CBG: No results for input(s): GLUCAP in the last 168 hours.  Review of Systems:   Gen: Denies fever, chills, weight change, fatigue, night sweats HEENT: Denies blurred vision, double vision, hearing loss, tinnitus, sinus congestion, rhinorrhea, sore throat, neck stiffness, dysphagia PULM:per HPI CV: Denies chest pain, edema, orthopnea, paroxysmal nocturnal dyspnea, palpitations GI: Denies abdominal pain, nausea, vomiting, diarrhea, hematochezia, melena, constipation, change in bowel habits GU: Denies dysuria, hematuria, polyuria, oliguria,  urethral discharge Endocrine: Denies hot or cold intolerance, polyuria, polyphagia or appetite change Derm: Denies rash, dry skin, scaling or peeling skin change Heme: Denies easy bruising, bleeding, bleeding gums Neuro: Denies headache, numbness, weakness, slurred speech, loss of memory or consciousness   Past Medical History  She,  has a past medical history of Adenomatous colon polyp, Allergic rhinitis, cause unspecified, COPD (chronic obstructive pulmonary disease) ([place]), Depressive disorder, not elsewhere classified (2006), OA (osteoarthritis), and Osteopenia.   Surgical History    Past Surgical History:  Procedure Laterality Date  . BREAST ENHANCEMENT SURGERY  1978   implants placed '78, removed and replaced  approx '95 (Dr. Wendy Poet)  . COLONOSCOPY  08/2011  . COSMETIC SURGERY  LIPOSUCTION; other facial cosmetic surgery  . GANGLION CYST EXCISION    . [place] HERNIA REPAIR  2004   right, and ganglion cyst removal  . LIPOMA EXCISION    . MELANOMA EXCISION  2013   R upper arm  . NASAL SEPTUM SURGERY  1987   for deviated septum (Dr. Wendy Poet)  . TONSILLECTOMY AND ADENOIDECTOMY  1951  . VIDEO BRONCHOSCOPY Bilateral 08/08/2015   Procedure: VIDEO BRONCHOSCOPY WITHOUT FLUORO;  Surgeon: Juanito Doom, MD;  Location: [facility] ENDOSCOPY;  Service: Cardiopulmonary;  Laterality: Bilateral;     Social History   reports that she quit smoking about 4 years ago. Her smoking use included cigarettes. She has a 45.00 pack-year smoking history. She has never used smokeless tobacco. She reports current alcohol use. She reports that she does not use drugs.   Family History   Her family history includes Alcohol abuse in her father; Aortic aneurysm in her sister; Cancer in her maternal grandfather, mother, and sister; Cancer (age of onset: 46) in her maternal grandmother; Crohn's disease in her sister; Diabetes in her maternal uncle; Heart disease in her father and paternal grandfather;  Hyperlipidemia in her father and sister; Hypertension in her father; Stroke in her paternal grandmother.   Allergies No Known Allergies   Home Medications  Prior to Admission medications   Medication Sig Start Date End Date Taking? Authorizing Provider  albuterol (PROVENTIL HFA;VENTOLIN HFA) 108 (90 Base) MCG/ACT inhaler Inhale 2 puffs into the lungs every 6 (six) hours as needed for wheezing or shortness of breath. 07/16/18  Yes Juanito Doom, MD  albuterol (PROVENTIL) (2.5 MG/3ML) 0.083% nebulizer solution USE 1 VIAL VIA NEBULIZER EVERY 2 HOURS AS NEEDED FOR SHORTNESS OF BREATH Patient taking differently: Take 2.5 mg by nebulization every 2 (two) hours as needed for wheezing or shortness of breath.  07/24/17  Yes Juanito Doom, MD  Calcium Carbonate-Vitamin D (CALCIUM-VITAMIN D) 500-200 MG-UNIT tablet Take 1 tablet by mouth daily.   Yes [provider]  citalopram (CELEXA) 10 MG tablet TAKE 1 TABLET EVERY DAY Patient taking differently: Take 10 mg by mouth at bedtime.  02/23/18  Yes [name], MD  doxycycline (VIBRA-TABS) 100 MG tablet Take 1 tablet (100 mg total) by mouth 2 (two) times daily. 08/10/18  Yes Lauraine Rinne, NP  Fluticasone-Salmeterol (ADVAIR DISKUS) 250-50 MCG/DOSE AEPB INHALE 1 PUFF INTO THE LUNGS EVERY 12 HOURS 07/16/18  Yes Juanito Doom, MD  guaiFENesin (MUCINEX) 600 MG 12 hr tablet Take 1,200 mg by mouth 2 (two) times daily.    Yes [provider]  montelukast (SINGULAIR) 10 MG tablet TAKE 1 TABLET BY MOUTH EVERYDAY AT BEDTIME Patient taking differently: Take 10 mg by mouth at bedtime.  11/17/17  Yes Juanito Doom, MD  oxymetazoline (AFRIN) 0.05 % nasal spray Place 1 spray into both nostrils 2 (two) times daily as needed for congestion.   Yes [provider]  sodium chloride HYPERTONIC 3 % nebulizer solution Take by nebulization 2 (two) times daily as needed for other. 01/28/18  Yes Juanito Doom, MD  umeclidinium bromide  (INCRUSE ELLIPTA) 62.5 MCG/INH AEPB Inhale 1 puff into the lungs daily. 07/31/18  Yes Juanito Doom, MD  fluticasone (FLONASE) 50 MCG/ACT nasal spray Place 2 sprays into both nostrils daily. Patient not taking: Reported on 08/11/2018 08/12/14 12/30/16  Donita Brooks, NP  OXYGEN Inhale 2 L into the lungs.    [provider]  predniSONE (DELTASONE) 20 MG tablet  Take 1 tablet (20 mg total) by mouth daily with breakfast. 08/10/18   Lauraine Rinne, NP     [name], MD [facility] Pager: [phone] Cell: [phone] If no response, call [phone]

## 2018-08-12 NOTE — Progress Notes (Signed)
PROGRESS NOTE  Courtney Ayers  VHQ:[id number] DOB: [birth year] DOA: 08/11/2018 PCP: [name], MD   Brief Narrative: Courtney Ayers is a 73 y.o. female with a history of chronic hypoxic respiratory failure, COPD, HTN, depression who presented with dyspnea and cough. She was prescribed prednisone and doxycycline after she reported to be too weak to present to pulmonology office visit but had GI side effects limiting this to a single dose. In the ED she was tachypneic, wheezing with fever of 101.31F. CXR without definite infiltrate, viral panel negative, no recent travel to implicate coronavirus. She was given IV steroids, ceftriaxone, and continues to feel severely dyspneic with oxygen requirement up to 5LPM. Her pulmonologist was consulted and recommendations given.  Assessment & Plan: Principal Problem:   COPD with acute exacerbation ([place]) Active Problems:   Chronic respiratory failure with hypoxia (HCC)   Sepsis (HCC)   HTN (hypertension)   Tachypnea  Acute on chronic hypoxic respiratory failure: Due to COPD exacerbation, presumed acute infectious process involved - Continue 5L O2, wean to maintain saturations 88-92%. - Continue antibiotics - Continue IV steroids - Continue breathing treatments.  - Appreciate pulmonology assistance with this patient.   Depression, anxiety:  - Continue SSRI - Add low dose xanax prn as anxiety is causing significant distress  DVT prophylaxis: Lovenox Code Status: Partial Family Communication: None at bedside  Disposition Plan: Home once clinically improving  Consultants:   Pulmonology  Procedures:   None  Antimicrobials:  Ceftriaxone, azithromycin   Subjective: Dyspnea is severe, described as episodic "spasms" where she is unable to get air in, causing severe panic/anxiety. No chest pain or palpitations. Denies current fever/chills. Having some cough with brown mucus occasional bloody streaks.  Objective: Vitals:   08/12/18  0633 08/12/18 0835 08/12/18 0838 08/12/18 1403  BP: 140/84   124/74  Pulse: 91   99  Resp: 18   20  Temp: 98.4 F (36.9 C)   97.8 F (36.6 C)  TempSrc: Oral   Oral  SpO2: 99% 99% 99% 100%  Weight:      Height:        Intake/Output Summary (Last 24 hours) at 08/12/2018 1653 Last data filed at 08/12/2018 1400 Gross per 24 hour  Intake 1525 ml  Output 1050 ml  Net 475 ml   Filed Weights   08/11/18 1711  Weight: 62.6 kg    Gen: Chronically ill-appearing female in no distress  Pulm: Tachypneic with 5L O2, diminished globally with expiratory wheezing. CV: Regular rate and rhythm. No murmur, rub, or gallop. No JVD, trace pedal edema. GI: Abdomen soft, non-tender, non-distended, with normoactive bowel sounds. No organomegaly or masses felt. Ext: Warm, no deformities Skin: No rashes, lesions no ulcers Neuro: Alert and oriented. No focal neurological deficits. Psych: Judgement and insight appear normal. Mood & affect appropriate.   Data Reviewed: I have personally reviewed following labs and imaging studies  CBC: Recent Labs  Lab 08/11/18 1728 08/12/18 0454  WBC 8.2 5.5  NEUTROABS 7.5  --   HGB 13.8 13.0  HCT 44.1 41.6  MCV 98.4 99.5  PLT 182 413   Basic Metabolic Panel: Recent Labs  Lab 08/11/18 1728 08/12/18 0454  NA 136 141  K 3.6 4.5  CL 99 104  CO2 28 29  GLUCOSE 123* 158*  BUN 11 11  CREATININE 0.70 0.68  CALCIUM 9.1 9.1   GFR: Estimated Creatinine Clearance: 60.9 mL/min (by C-G formula based on SCr of 0.68 mg/dL). Liver Function Tests:  Recent Labs  Lab 08/11/18 1728 08/12/18 0454  AST 26 23  ALT 21 18  ALKPHOS 115 97  BILITOT 0.7 0.5  PROT 7.7 6.7  ALBUMIN 4.6 3.9   No results for input(s): LIPASE, AMYLASE in the last 168 hours. No results for input(s): AMMONIA in the last 168 hours. Coagulation Profile: No results for input(s): INR, PROTIME in the last 168 hours. Cardiac Enzymes: Recent Labs  Lab 08/11/18 1728  TROPONINI 0.05*   BNP  (last 3 results) No results for input(s): PROBNP in the last 8760 hours. HbA1C: No results for input(s): HGBA1C in the last 72 hours. CBG: No results for input(s): GLUCAP in the last 168 hours. Lipid Profile: No results for input(s): CHOL, HDL, LDLCALC, TRIG, CHOLHDL, LDLDIRECT in the last 72 hours. Thyroid Function Tests: No results for input(s): TSH, T4TOTAL, FREET4, T3FREE, THYROIDAB in the last 72 hours. Anemia Panel: No results for input(s): VITAMINB12, FOLATE, FERRITIN, TIBC, IRON, RETICCTPCT in the last 72 hours. Urine analysis:    Component Value Date/Time   COLORURINE YELLOW 06/05/2016 0840   APPEARANCEUR CLEAR 06/05/2016 0840   LABSPEC 1.018 06/05/2016 0840   PHURINE 5.0 06/05/2016 0840   GLUCOSEU NEGATIVE 06/05/2016 0840   HGBUR SMALL (A) 06/05/2016 0840   BILIRUBINUR NEGATIVE 06/05/2016 0840   BILIRUBINUR neg 09/08/2012 1453   KETONESUR 5 (A) 06/05/2016 0840   PROTEINUR NEGATIVE 06/05/2016 0840   UROBILINOGEN negative 09/08/2012 1453   NITRITE NEGATIVE 06/05/2016 0840   LEUKOCYTESUR TRACE (A) 06/05/2016 0840   Recent Results (from the past 240 hour(s))  Culture, blood (routine x 2)     Status: None (Preliminary result)   Collection Time: 08/11/18  7:44 PM  Result Value Ref Range Status   Specimen Description   Final    BLOOD RIGHT ARM Performed at [facility], [place] [address] [place], [place] [address]    Special Requests   Final    BOTTLES DRAWN AEROBIC AND ANAEROBIC Blood Culture results may not be optimal due to an excessive volume of blood received in culture bottles Performed at [facility] [address] [place], [place] [address]    Culture   Final    NO GROWTH < 24 HOURS Performed at [facility] [address] [place], [place] [address]    Report Status PENDING  Incomplete  Culture, blood (routine x 2)     Status: None (Preliminary result)   Collection Time: 08/11/18  7:45 PM  Result Value Ref Range  Status   Specimen Description   Final    BLOOD LEFT ANTECUBITAL Performed at [facility] [address], [place], [place] [address]    Special Requests   Final    BOTTLES DRAWN AEROBIC AND ANAEROBIC Blood Culture adequate volume Performed at [facility] [address] [place],  [address]    Culture   Final    NO GROWTH < 24 HOURS Performed at [facility] [address], [place],  [address]    Report Status PENDING  Incomplete      Radiology Studies: Dg Chest 2 View  Result Date: 08/11/2018 CLINICAL DATA:  Worsening shortness of breath over the past 48 hours. COPD. Ex-smoker. Cough. EXAM: CHEST - 2 VIEW COMPARISON:  Chest CT dated 02/03/2018 and chest radiographs dated 06/04/2016. FINDINGS: Normal sized heart. Stable marked hyperexpansion of the lungs with bilateral bullous changes and linear scarring. No radiographically visible lung nodules, better seen on the  previous CT. Unremarkable bones. One of the previously demonstrated breast implants is visible on the lateral view. IMPRESSION: No acute disease. Stable marked changes of COPD. Electronically Signed   By: Claudie Revering M.D.   On: 08/11/2018 18:51    Scheduled Meds: . albuterol  2.5 mg Nebulization QID  . arformoterol  15 mcg Nebulization BID  . budesonide (PULMICORT) nebulizer solution  0.5 mg Nebulization BID  . calcium-vitamin D  1 tablet Oral Daily  . citalopram  10 mg Oral QHS  . enoxaparin (LOVENOX) injection  40 mg Subcutaneous QHS  . guaiFENesin  1,200 mg Oral BID  . methylPREDNISolone (SOLU-MEDROL) injection  60 mg Intravenous Q6H  . montelukast  10 mg Oral QHS  . umeclidinium bromide  1 puff Inhalation Daily   Continuous Infusions: . sodium chloride 75 mL/hr at 08/12/18 1117  . cefTRIAXone (ROCEPHIN)  IV       LOS: 1 day   Time spent: 25 minutes.  Patrecia Pour, MD [facility] [email or web address] Password [email or web address] 08/12/2018, 4:53 PM

## 2018-08-13 MED ORDER — SODIUM CHLORIDE 0.9 % IV SOLN
1.0000 g | INTRAVENOUS | Status: AC
  Administered 2018-08-13 – 2018-08-14 (×2): 1 g via INTRAVENOUS
  Filled 2018-08-13 (×2): qty 1

## 2018-08-13 MED ORDER — IPRATROPIUM BROMIDE 0.02 % IN SOLN
0.5000 mg | Freq: Four times a day (QID) | RESPIRATORY_TRACT | Status: DC
  Administered 2018-08-13: 0.5 mg via RESPIRATORY_TRACT
  Filled 2018-08-13: qty 2.5

## 2018-08-13 MED ORDER — IPRATROPIUM-ALBUTEROL 0.5-2.5 (3) MG/3ML IN SOLN
3.0000 mL | RESPIRATORY_TRACT | Status: DC
  Administered 2018-08-13 – 2018-08-14 (×8): 3 mL via RESPIRATORY_TRACT
  Filled 2018-08-13 (×7): qty 3

## 2018-08-13 MED ORDER — IPRATROPIUM-ALBUTEROL 0.5-2.5 (3) MG/3ML IN SOLN: 3.0000 mL | Freq: Four times a day (QID) | RESPIRATORY_TRACT | Status: DC

## 2018-08-13 NOTE — Progress Notes (Signed)
NAME:  Courtney Ayers, MRN:  [id number], DOB:  [birth year], LOS: 2 ADMISSION DATE:  08/11/2018, CONSULTATION DATE: August 12, 2018 REFERRING MD: Dr. Bonner Puna, CHIEF COMPLAINT: Dyspnea  Brief History   73 year old female who sees Dr. [place] Bells in clinic for advanced COPD, 3 L oxygen baseline presented to [facility] long hospital with several days of chest congestion mucus production shortness of breath.  Has been treated for a COPD exacerbation.  Pulmonary consulted for ongoing dyspnea.  Past Medical History  COPD Chronic respiratory failure with hypoxemia Depression  Significant Hospital Events   3/03  Admit  3/04  PCCM consulted   Consults:  Pulmonary  Procedures:    Significant Diagnostic Tests:    Micro Data:  3/3 flu swab >> negative  Antimicrobials:  Ceftriaxone 3/3 >>  Interim history/subjective:  Pt reports not feeling any better > states she feels as though she can not get enough air in with breathing.    Objective   Blood pressure 115/63, pulse 84, temperature 98.7 F (37.1 C), temperature source Oral, resp. rate 20, height 5\' 7"  (1.702 m), weight 62.6 kg, last menstrual period 06/10/1992, SpO2 99 %.        Intake/Output Summary (Last 24 hours) at 08/13/2018 1037 Last data filed at 08/13/2018 0901 Gross per 24 hour  Intake 1293.74 ml  Output 802 ml  Net 491.74 ml   Filed Weights   08/11/18 1711  Weight: 62.6 kg    Examination: General: adult female lying in bed in NAD HEENT: MM pink/moist, [place] O2 Neuro: AAOx4, non-focal, MAE/normal strength  CV: s1s2 rrr, no m/r/g PULM: prolonged exp phase, pursed lipped breathing, wheezing bilaterally  CB:SWHQ, non-tender, bsx4 active  Extremities: warm/dry, no edema  Skin: no rashes or lesions  Resolved Hospital Problem list     Assessment & Plan:   73 year old female admitted for acute on chronic respiratory failure with hypoxemia due to a COPD exacerbation.  Appears to be due to infectious etiology, no recent travel  to suspect coronavirus.  Acute Exacerbation of COPD  P: Pulmonary hygiene - IS, mobilize, 3% nebulized saline BID for 3 days Hold home Breo > can transition back once closer to discharge Hold home Incruse > replace with scheduled atrovent Q6 Continue Brovana, Pulmicort while inpatient Q4 PRN albuterol  Continue ceftriaxone Solumedrol 60 mg IV Q6 > wean as pt improves   Acute on Chronic Respiratory Failure with Hypoxemia P: Wean O2 for sats 88-92%   Best practice:  Per [facility]   Labs   CBC: Recent Labs  Lab 08/11/18 1728 08/12/18 0454  WBC 8.2 5.5  NEUTROABS 7.5  --   HGB 13.8 13.0  HCT 44.1 41.6  MCV 98.4 99.5  PLT 182 759    Basic Metabolic Panel: Recent Labs  Lab 08/11/18 1728 08/12/18 0454  NA 136 141  K 3.6 4.5  CL 99 104  CO2 28 29  GLUCOSE 123* 158*  BUN 11 11  CREATININE 0.70 0.68  CALCIUM 9.1 9.1   GFR: Estimated Creatinine Clearance: 60.9 mL/min (by C-G formula based on SCr of 0.68 mg/dL). Recent Labs  Lab 08/11/18 1728 08/11/18 1944 08/12/18 0454  WBC 8.2  --  5.5  LATICACIDVEN  --  1.4  --     Liver Function Tests: Recent Labs  Lab 08/11/18 1728 08/12/18 0454  AST 26 23  ALT 21 18  ALKPHOS 115 97  BILITOT 0.7 0.5  PROT 7.7 6.7  ALBUMIN 4.6 3.9   No results for input(s):  LIPASE, AMYLASE in the last 168 hours. No results for input(s): AMMONIA in the last 168 hours.  ABG    Component Value Date/Time   PHART 7.324 (L) 08/07/2014 2335   PCO2ART 69.6 (HH) 08/07/2014 2335   PO2ART 176.0 (H) 08/07/2014 2335   HCO3 26.4 08/11/2018 2007   TCO2 37.3 08/07/2014 2335   O2SAT 96.8 08/11/2018 2007     Coagulation Profile: No results for input(s): INR, PROTIME in the last 168 hours.  Cardiac Enzymes: Recent Labs  Lab 08/11/18 1728  TROPONINI 0.05*    HbA1C: Hgb A1c MFr Bld  Date/Time Value Ref Range Status  07/01/2016 02:04 PM 5.1 <5.7 % Final    Comment:      For the purpose of screening for the presence of diabetes:     <5.7%       Consistent with the absence of diabetes 5.7-6.4 %   Consistent with increased risk for diabetes (prediabetes) >=6.5 %     Consistent with diabetes   This assay result is consistent with a decreased risk of diabetes.   Currently, no consensus exists regarding use of hemoglobin A1c for diagnosis of diabetes in children.   According to American Diabetes Association (ADA) guidelines, hemoglobin A1c <7.0% represents optimal control in non-pregnant diabetic patients. Different metrics may apply to specific patient populations. Standards of Medical Care in Diabetes (ADA).     06/04/2016 08:26 PM 5.2 4.8 - 5.6 % Final    Comment:    (NOTE)         Pre-diabetes: 5.7 - 6.4         Diabetes: >6.4         Glycemic control for adults with diabetes: <7.0     CBG: No results for input(s): GLUCAP in the last 168 hours.   Noe Gens, NP-C [facility] Pgr: [phone] or if no answer [phone] 08/13/2018, 10:38 AM

## 2018-08-13 NOTE — Progress Notes (Signed)
PROGRESS NOTE  RENEZMAE Ayers  AYT:[id number] DOB: 1945-07-02 DOA: 08/11/2018 PCP: [name], MD   Brief Narrative: Courtney Ayers is a 73 y.o. female with a history of chronic hypoxic respiratory failure, COPD, HTN, depression who presented with dyspnea and cough. She was prescribed prednisone and doxycycline after she reported to be too weak to present to pulmonology office visit but had GI side effects limiting this to a single dose. In the ED she was tachypneic, wheezing with fever of 101.30F. CXR without definite infiltrate, viral panel negative, no recent travel to implicate coronavirus. She was given IV steroids, ceftriaxone, and continues to feel severely dyspneic with oxygen requirement up to 5LPM. Her pulmonologist was consulted and recommendations given.  Assessment & Plan: Principal Problem:   COPD with acute exacerbation ([place]) Active Problems:   Chronic respiratory failure with hypoxia (HCC)   Sepsis (HCC)   HTN (hypertension)   Tachypnea  Acute on chronic hypoxic respiratory failure: Due to COPD exacerbation, presumed acute infectious process involved - Continue supplemental oxygen prn, wean to maintain saturations 88-92%. - Continue antibiotics, ceftriaxone empirically 3/3 - 3/7 - Continue IV steroids due to ongoing wheezing. Breathing not near baseline. - Continue breathing treatments.  - Appreciate pulmonology assistance with this patient. Changing to scheduled atrovent.  Depression, anxiety:  - Continue SSRI - Continue low dose xanax prn, newly added.  DVT prophylaxis: Lovenox Code Status: Partial Family Communication: None at bedside  Disposition Plan: Home once clinically improving  Consultants:   Pulmonology  Procedures:   None  Antimicrobials:  Ceftriaxone 3/3 - 3/7  Azithromycin 3/3   Subjective: Feels essentially the same with severe constant dyspnea, inability to "get a breath", worse intermittently. Overall sensation is improved with  xanax prn. No sedation noted by pt or RN. Denies chest pain. Objective: Vitals:   08/13/18 0553 08/13/18 1301 08/13/18 1347 08/13/18 1506  BP: 115/63  119/67   Pulse: 84  82   Resp: 20  20   Temp: 98.7 F (37.1 C)  98.4 F (36.9 C)   TempSrc: Oral  Oral   SpO2: 99% 96% 97% 97%  Weight:      Height:        Intake/Output Summary (Last 24 hours) at 08/13/2018 1740 Last data filed at 08/13/2018 1400 Gross per 24 hour  Intake 1233.74 ml  Output 1152 ml  Net 81.74 ml   Filed Weights   08/11/18 1711  Weight: 62.6 kg   Gen: Chronically ill-appearing female in no distress Pulm: Tachypnea with prolonged expiration and pursed lip breathing. Distant siffusely with end-expiratory wheezing CV: Regular rate and rhythm. No murmur, rub, or gallop. No JVD, no dependent edema. GI: Abdomen soft, non-tender, non-distended, with normoactive bowel sounds.  Ext: Warm, no deformities Skin: No new rashes, lesions or ulcers on visualized skin. Neuro: Alert and oriented. No focal neurological deficits. Psych: Judgement and insight appear fair. Mood euthymic & affect congruent. Behavior is appropriate.    Data Reviewed: I have personally reviewed following labs and imaging studies  CBC: Recent Labs  Lab 08/11/18 1728 08/12/18 0454  WBC 8.2 5.5  NEUTROABS 7.5  --   HGB 13.8 13.0  HCT 44.1 41.6  MCV 98.4 99.5  PLT 182 355   Basic Metabolic Panel: Recent Labs  Lab 08/11/18 1728 08/12/18 0454  NA 136 141  K 3.6 4.5  CL 99 104  CO2 28 29  GLUCOSE 123* 158*  BUN 11 11  CREATININE 0.70 0.68  CALCIUM 9.1  9.1   Liver Function Tests: Recent Labs  Lab 08/11/18 1728 08/12/18 0454  AST 26 23  ALT 21 18  ALKPHOS 115 97  BILITOT 0.7 0.5  PROT 7.7 6.7  ALBUMIN 4.6 3.9   Recent Results (from the past 240 hour(s))  Culture, blood (routine x 2)     Status: None (Preliminary result)   Collection Time: 08/11/18  7:44 PM  Result Value Ref Range Status   Specimen Description   Final    BLOOD  RIGHT ARM Performed at [facility] [address] [place], [place] [address]    Special Requests   Final    BOTTLES DRAWN AEROBIC AND ANAEROBIC Blood Culture results may not be optimal due to an excessive volume of blood received in culture bottles Performed at [facility] [address], [place], [place] [address]    Culture   Final    NO GROWTH 2 DAYS Performed at [facility] [address], [place], [place] [address]    Report Status PENDING  Incomplete  Culture, blood (routine x 2)     Status: None (Preliminary result)   Collection Time: 08/11/18  7:45 PM  Result Value Ref Range Status   Specimen Description   Final    BLOOD LEFT ANTECUBITAL Performed at [facility] [address], [place], [place] [address]    Special Requests   Final    BOTTLES DRAWN AEROBIC AND ANAEROBIC Blood Culture adequate volume Performed at [facility] [address], [place], [place] [address]    Culture   Final    NO GROWTH 2 DAYS Performed at [facility] [address] [place], [place] [address]    Report Status PENDING  Incomplete      Radiology Studies: Dg Chest 2 View  Result Date: 08/11/2018 CLINICAL DATA:  Worsening shortness of breath over the past 48 hours. COPD. Ex-smoker. Cough. EXAM: CHEST - 2 VIEW COMPARISON:  Chest CT dated 02/03/2018 and chest radiographs dated 06/04/2016. FINDINGS: Normal sized heart. Stable marked hyperexpansion of the lungs with bilateral bullous changes and linear scarring. No radiographically visible lung nodules, better seen on the previous CT. Unremarkable bones. One of the previously demonstrated breast implants is visible on the lateral view. IMPRESSION: No acute disease. Stable marked changes of COPD. Electronically Signed   By: Claudie Revering M.D.   On: 08/11/2018 18:51   LOS: 2 days   Time spent: 25 minutes.  Patrecia Pour, MD [facility] [email or web address] Password [facility] 08/13/2018, 5:40 PM

## 2018-08-14 ENCOUNTER — Inpatient Hospital Stay (HOSPITAL_COMMUNITY): Payer: Medicare Other

## 2018-08-14 DIAGNOSIS — J9601 Acute respiratory failure with hypoxia: Secondary | ICD-10-CM

## 2018-08-14 DIAGNOSIS — I10 Essential (primary) hypertension: Secondary | ICD-10-CM

## 2018-08-14 DIAGNOSIS — Z9989 Dependence on other enabling machines and devices: Secondary | ICD-10-CM

## 2018-08-14 DIAGNOSIS — J9602 Acute respiratory failure with hypercapnia: Secondary | ICD-10-CM

## 2018-08-14 DIAGNOSIS — J9621 Acute and chronic respiratory failure with hypoxia: Secondary | ICD-10-CM

## 2018-08-14 LAB — BLOOD GAS, ARTERIAL
ACID-BASE EXCESS: 8.5 mmol/L — AB (ref 0.0–2.0)
Acid-Base Excess: 11.1 mmol/L — ABNORMAL HIGH (ref 0.0–2.0)
BICARBONATE: 39.2 mmol/L — AB (ref 20.0–28.0)
Bicarbonate: 38.1 mmol/L — ABNORMAL HIGH (ref 20.0–28.0)
Delivery systems: POSITIVE
Drawn by: 257881
Drawn by: 257881
Expiratory PAP: 6
FIO2: 25
Inspiratory PAP: 14
Mode: POSITIVE
O2 Content: 4.5 L/min
O2 Saturation: 97.9 %
O2 Saturation: 92.6 %
Patient temperature: 98.6
Patient temperature: 98.6
pCO2 arterial: 93.6 mmHg (ref 32.0–48.0)
pCO2 arterial: 63.5 mmHg — ABNORMAL HIGH (ref 32.0–48.0)
pH, Arterial: 7.245 — ABNORMAL LOW (ref 7.350–7.450)
pH, Arterial: 7.396 (ref 7.350–7.450)
pO2, Arterial: 117 mmHg — ABNORMAL HIGH (ref 83.0–108.0)
pO2, Arterial: 64.6 mmHg — ABNORMAL LOW (ref 83.0–108.0)

## 2018-08-14 LAB — MRSA PCR SCREENING: MRSA by PCR: NEGATIVE

## 2018-08-14 MED ORDER — SODIUM CHLORIDE 0.9 % IV SOLN
INTRAVENOUS | Status: DC | PRN
  Administered 2018-08-14: 250 mL via INTRAVENOUS
  Administered 2018-08-16: 500 mL via INTRAVENOUS

## 2018-08-14 MED ORDER — DEXTROSE 5 % IV SOLN
250.0000 mg | INTRAVENOUS | Status: AC
  Administered 2018-08-14 – 2018-08-18 (×5): 250 mg via INTRAVENOUS
  Filled 2018-08-14 (×5): qty 250

## 2018-08-14 MED ORDER — CHLORHEXIDINE GLUCONATE 0.12 % MT SOLN
15.0000 mL | Freq: Two times a day (BID) | OROMUCOSAL | Status: DC
  Administered 2018-08-14 – 2018-08-28 (×25): 15 mL via OROMUCOSAL
  Filled 2018-08-14 (×21): qty 15

## 2018-08-14 MED ORDER — IPRATROPIUM-ALBUTEROL 0.5-2.5 (3) MG/3ML IN SOLN
3.0000 mL | Freq: Four times a day (QID) | RESPIRATORY_TRACT | Status: DC
  Administered 2018-08-15 – 2018-08-24 (×40): 3 mL via RESPIRATORY_TRACT
  Filled 2018-08-14 (×39): qty 3

## 2018-08-14 MED ORDER — ORAL CARE MOUTH RINSE
15.0000 mL | Freq: Two times a day (BID) | OROMUCOSAL | Status: DC
  Administered 2018-08-14 – 2018-08-25 (×10): 15 mL via OROMUCOSAL

## 2018-08-14 MED ORDER — MAGNESIUM SULFATE 2 GM/50ML IV SOLN
2.0000 g | Freq: Once | INTRAVENOUS | Status: AC
  Administered 2018-08-14: 2 g via INTRAVENOUS
  Filled 2018-08-14: qty 50

## 2018-08-14 MED ORDER — CHLORHEXIDINE GLUCONATE CLOTH 2 % EX PADS
6.0000 | MEDICATED_PAD | Freq: Every day | CUTANEOUS | Status: DC
  Administered 2018-08-14 – 2018-08-28 (×15): 6 via TOPICAL

## 2018-08-14 NOTE — Significant Event (Signed)
Rapid Response Event Note  Overview: Time Called: 0832 Arrival Time: 0835 Event Type: Respiratory  Initial Focused Assessment:  RRT called to bedside by RN due to increased work of breathing, drowsiness, and inability to speak due to shortness of breath. On arrival patient resting in bed with clear accessory muscle use, inability to speak full sentences due to respiratory effort. Respiratory at bedside ABG orders received and MD called to bedside. Patients sister updated and she stated she will be here shortly. Dr. Bonner Puna at bedside read aloud results of ABG. Possibility of BiPap discussed with patient and patient states she has utilized it in the past and has no objections if needed. RT to give nebulizer and plan to move patient to ICU and start BiPap. Bedside RN to call report and RRT to coordinate transport to the intensive care unit. Code status/ goals of care and possibility of intubation discussed with patient via MD. Patient states she does not want prolonged ventilation if outcome is poor, but for acute incident patient sates she is okay with short term intubation IF needed.   Interventions:  Plan of Care (if not transferred):  Event Summary: Name of Physician Notified: Dr. Bonner Puna MD at [facility]    at    Outcome: Transferred (Comment)  Transferred to ICU 1237, Bedside RN Levada Dy to call report to [facility]  Event End Time: 0900  Courtney Ayers

## 2018-08-14 NOTE — Care Management Important Message (Signed)
Important Message  Patient Details  Name: Courtney Ayers MRN: [id number] Date of Birth: 09-Oct-1945   Medicare Important Message Given:  No. Patient was discharged.     Bridgit Eynon 08/14/2018, 9:13 AM

## 2018-08-14 NOTE — Progress Notes (Signed)
PROGRESS NOTE  Courtney Ayers  ION:[id number] DOB: [birth year] DOA: 08/11/2018 PCP: [name], MD   Brief Narrative: Courtney Ayers is a 73 y.o. female with a history of chronic hypoxic respiratory failure, COPD, HTN, depression who presented with dyspnea and cough. She was prescribed prednisone and doxycycline after she reported to be too weak to present to pulmonology office visit but had GI side effects limiting this to a single dose. In the ED she was tachypneic, wheezing with fever of 101.50F. CXR without definite infiltrate, viral panel negative, no recent travel to implicate coronavirus. She was given IV steroids, ceftriaxone, and continues to feel severely dyspneic with oxygen requirement up to 5LPM. Her pulmonologist was consulted and recommendations given.  Assessment & Plan: Principal Problem:   COPD with acute exacerbation ([place]) Active Problems:   Acute on chronic respiratory failure with hypoxia (HCC)   Chronic respiratory failure with hypoxia (HCC)   HTN (hypertension)   Tachypnea  Acute on chronic hypoxic respiratory failure: Due to COPD exacerbation, presumed acute infectious process involved. No RVP at admission, though this could easily be cause of decompensation.  - Acutely worse 3/6 AM. Exam remains consistent with COPD. CXR ordered and reviewed personally showing hyperexpansion without infiltrate or edema. ABG ordered, showing respiratory acidosis. Uncertain baseline pCO2 but up to >90 now. Give breathing treatments, start BiPAP, transfer to SDU. Case discussed with PCCM. On reevaluation later today, sister at bedside. The patient confirms the desire to be intubated if BiPAP not helpful with respiratory distress. Would NOT want prolonged measures unless she had a reasonable chance at return to current quality of life. I did share my reservations that this would likely be the case.  - Morphine may also assist with dyspnea. - IV Mag given per pulm as risk < potential  benefit - Added back azithromycin for antiinflammatory property  - Continue supplemental oxygen, but would not target SpO2 >92%. - Continue antibiotics, ceftriaxone empirically 3/3 - 3/7 and azithromycin as above.  - Continue IV steroids due to ongoing wheezing, high dose 60mg  IV q6h. - Continue breathing treatments.  - Appreciate pulmonology assistance with this patient. Changing to scheduled atrovent.  Depression, anxiety:  - Continue SSRI - Continue low dose xanax prn, newly added.  DVT prophylaxis: Lovenox Code Status: Partial Family Communication: None at bedside this AM, sister at bedside this afternoon. Disposition Plan: Transfer to SDU urgently. Guarded prognosis  Consultants:   Pulmonology  Procedures:   None  Antimicrobials:  Ceftriaxone 3/3 - 3/7  Azithromycin 3/3   Subjective: Noticed gradually worsening inability to get breath and taking longer to exhale. RT evaluated, felt exam had worsened, significant constant inspiratory and expiratory wheezing. Patient denies chest pain or leg swelling.   Objective: Vitals:   08/14/18 1100 08/14/18 1200 08/14/18 1221 08/14/18 1300  BP: 133/75 123/73 123/73 133/72  Pulse: 88 86 89 90  Resp: (!) 24 (!) 21 12 20   Temp:  97.8 F (36.6 C)    TempSrc:  Oral    SpO2: 96% 97% 100% 93%  Weight:      Height:        Intake/Output Summary (Last 24 hours) at 08/14/2018 1423 Last data filed at 08/14/2018 1345 Gross per 24 hour  Intake 515.57 ml  Output 450 ml  Net 65.57 ml   Filed Weights   08/11/18 1711 08/14/18 0918  Weight: 62.6 kg 63.6 kg   Gen: Chronically ill-appearing 73yo F in mild respiratory distress prior to BiPAP.  Pulm:  Labored, measured breathing with prolonged expiration, panexpiratory wheezing, no crackles. Remains diffusely very diminished. CV: Regular rate and rhythm. No murmur, rub, or gallop. No JVD, no dependent edema. GI: Abdomen soft, non-tender, non-distended, with normoactive bowel sounds.  Ext:  Warm, no deformities. Negative homan's Skin: No new rashes, lesions or ulcers on visualized skin. Neuro: Alert, not lethargic, and oriented. No focal neurological deficits. Psych: Judgement and insight appear intact.  Data Reviewed: I have personally reviewed following labs and imaging studies  CBC: Recent Labs  Lab 08/11/18 1728 08/12/18 0454  WBC 8.2 5.5  NEUTROABS 7.5  --   HGB 13.8 13.0  HCT 44.1 41.6  MCV 98.4 99.5  PLT 182 509   Basic Metabolic Panel: Recent Labs  Lab 08/11/18 1728 08/12/18 0454  NA 136 141  K 3.6 4.5  CL 99 104  CO2 28 29  GLUCOSE 123* 158*  BUN 11 11  CREATININE 0.70 0.68  CALCIUM 9.1 9.1   Liver Function Tests: Recent Labs  Lab 08/11/18 1728 08/12/18 0454  AST 26 23  ALT 21 18  ALKPHOS 115 97  BILITOT 0.7 0.5  PROT 7.7 6.7  ALBUMIN 4.6 3.9   Recent Results (from the past 240 hour(s))  Culture, blood (routine x 2)     Status: None (Preliminary result)   Collection Time: 08/11/18  7:44 PM  Result Value Ref Range Status   Specimen Description   Final    BLOOD RIGHT ARM Performed at [facility], [place] [address], [place], [place] [address]    Special Requests   Final    BOTTLES DRAWN AEROBIC AND ANAEROBIC Blood Culture results may not be optimal due to an excessive volume of blood received in culture bottles Performed at [facility] [address], [place], [place] [address]    Culture   Final    NO GROWTH 3 DAYS Performed at [facility], [place] [address] [place], [place] [address]    Report Status PENDING  Incomplete  Culture, blood (routine x 2)     Status: None (Preliminary result)   Collection Time: 08/11/18  7:45 PM  Result Value Ref Range Status   Specimen Description   Final    BLOOD LEFT ANTECUBITAL Performed at [facility] [address] [place], [place] [address]    Special Requests   Final    BOTTLES DRAWN AEROBIC AND ANAEROBIC Blood Culture  adequate volume Performed at [facility] [address], [place], [place] [address]    Culture   Final    NO GROWTH 3 DAYS Performed at [facility], [place] [address], [place], [place] [address]    Report Status PENDING  Incomplete      Radiology Studies: Dg Chest Port 1 View  Result Date: 08/14/2018 CLINICAL DATA:  Acute on chronic respiratory failure with hypoxia and hypercapnia. EXAM: PORTABLE CHEST 1 VIEW COMPARISON:  08/11/2018. FINDINGS: Cardiac silhouette is normal in size. No mediastinal or hilar masses or evidence of adenopathy. Lungs are hyperexpanded. Stable scarring most evident in the right upper lobe. No evidence of pneumonia or pulmonary edema. No pleural effusion or pneumothorax. Skeletal structures are grossly intact. IMPRESSION: 1. No acute cardiopulmonary disease. 2. Advanced COPD with chronic lung scarring. Electronically Signed   By: Lajean Manes M.D.   On: 08/14/2018 10:27   LOS: 3 days   Time spent: 35 minutes.  Patrecia Pour, MD [facility] [email or web address] Password [facility] 08/14/2018,  2:23 PM

## 2018-08-14 NOTE — Progress Notes (Deleted)
19ml fentanyl drip wasted. Witnessed by Nanci Pina, RN.

## 2018-08-14 NOTE — Progress Notes (Signed)
NAME:  Courtney Ayers, MRN:  [id number], DOB:  [birth year], LOS: 3 ADMISSION DATE:  08/11/2018, CONSULTATION DATE: August 12, 2018 REFERRING MD: Dr. Bonner Puna, CHIEF COMPLAINT: Dyspnea  Brief History   73 year old female who sees Dr. [place] Bells in clinic for advanced COPD, 3 L oxygen baseline presented to [facility] hospital with several days of chest congestion mucus production shortness of breath.  Has been treated for a COPD exacerbation.  Pulmonary consulted for ongoing dyspnea.  Past Medical History  COPD Chronic respiratory failure with hypoxemia Depression  Significant Hospital Events   3/03  Admit  3/04  PCCM consulted   Consults:  Pulmonary  Procedures:   Significant Diagnostic Tests:   Micro Data:  3/3 flu swab >> negative  Antimicrobials:  Ceftriaxone 3/3 >>  Interim history/subjective:  Rapid response this morning. ABG with elevated CO2, placed on BIPAP.   Objective   Blood pressure 134/70, pulse 100, temperature 98.1 F (36.7 C), temperature source Axillary, resp. rate (!) 28, height 5\' 7"  (1.702 m), weight 63.6 kg, last menstrual period 06/10/1992, SpO2 (!) 30 %.    FiO2 (%):  [30 %] 30 %   Intake/Output Summary (Last 24 hours) at 08/14/2018 1110 Last data filed at 08/14/2018 0900 Gross per 24 hour  Intake 581.42 ml  Output 800 ml  Net -218.58 ml   Filed Weights   08/11/18 1711 08/14/18 0918  Weight: 62.6 kg 63.6 kg    Examination: General: Acute respiratory distress, on BiPAP, elderly female HEENT: Unable to examine oropharynx, has sticky padding on the forehead cheeks to help cushion BiPAP mask likely has some skin breakdown already underneath this Neuro: Alert and oriented following commands no focal deficit moves all 4 extremities CV: Tachycardic, regular, S1-S2 PULM: Prolonged expiratory phase, expiratory wheezing present bilaterally GI: Soft nontender nondistended Extremities: No significant edema radial DP present Skin:Lower extremities are  mottled  Resolved Hospital Problem list     Assessment & Plan:   73 year old female admitted for acute on chronic respiratory failure with hypoxemia due to a COPD exacerbation.  Appears to be due to infectious etiology, no recent travel.  Acute Exacerbation of COPD Acute on chronic hypoxemic hypercarbic respiratory failure requiring NIPPV Patient is a durable DNI P: Continue nebulized treatments to include Brovana Pulmicort and scheduled duo nebs. Continue ceftriaxone, I have added azithromycin.  Her QTC 460 on ECG. Azithromycin may offer some anti-inflammatory steroid sparing effect. Continue her Solu-Medrol 60 mg IV every 6. I have given her an additional dose of IV magnesium.  This also for potential broncho-dilatory effect. We are at this point attempting to pull out all the stops to help prevent impending respiratory failure. She needs to continue using the BiPAP therapy.  I do not think she should take anything by mouth until this acute decompensation has settled.   Best practice:  Per [facility]   Labs    CBC: Recent Labs  Lab 08/11/18 1728 08/12/18 0454  WBC 8.2 5.5  NEUTROABS 7.5  --   HGB 13.8 13.0  HCT 44.1 41.6  MCV 98.4 99.5  PLT 182 016    Basic Metabolic Panel: Recent Labs  Lab 08/11/18 1728 08/12/18 0454  NA 136 141  K 3.6 4.5  CL 99 104  CO2 28 29  GLUCOSE 123* 158*  BUN 11 11  CREATININE 0.70 0.68  CALCIUM 9.1 9.1   GFR: Estimated Creatinine Clearance: 60.9 mL/min (by C-G formula based on SCr of 0.68 mg/dL). Recent Labs  Lab 08/11/18 1728  08/11/18 1944 08/12/18 0454  WBC 8.2  --  5.5  LATICACIDVEN  --  1.4  --     Liver Function Tests: Recent Labs  Lab 08/11/18 1728 08/12/18 0454  AST 26 23  ALT 21 18  ALKPHOS 115 97  BILITOT 0.7 0.5  PROT 7.7 6.7  ALBUMIN 4.6 3.9   No results for input(s): LIPASE, AMYLASE in the last 168 hours. No results for input(s): AMMONIA in the last 168 hours.  ABG    Component Value Date/Time    PHART 7.245 (L) 08/14/2018 0840   PCO2ART 93.6 (HH) 08/14/2018 0840   PO2ART 117 (H) 08/14/2018 0840   HCO3 39.2 (H) 08/14/2018 0840   TCO2 37.3 08/07/2014 2335   O2SAT 97.9 08/14/2018 0840     Coagulation Profile: No results for input(s): INR, PROTIME in the last 168 hours.  Cardiac Enzymes: Recent Labs  Lab 08/11/18 1728  TROPONINI 0.05*    HbA1C: Hgb A1c MFr Bld  Date/Time Value Ref Range Status  07/01/2016 02:04 PM 5.1 <5.7 % Final    Comment:      For the purpose of screening for the presence of diabetes:   <5.7%       Consistent with the absence of diabetes 5.7-6.4 %   Consistent with increased risk for diabetes (prediabetes) >=6.5 %     Consistent with diabetes   This assay result is consistent with a decreased risk of diabetes.   Currently, no consensus exists regarding use of hemoglobin A1c for diagnosis of diabetes in children.   According to American Diabetes Association (ADA) guidelines, hemoglobin A1c <7.0% represents optimal control in non-pregnant diabetic patients. Different metrics may apply to specific patient populations. Standards of Medical Care in Diabetes (ADA).     06/04/2016 08:26 PM 5.2 4.8 - 5.6 % Final    Comment:    (NOTE)         Pre-diabetes: 5.7 - 6.4         Diabetes: >6.4         Glycemic control for adults with diabetes: <7.0     CBG: No results for input(s): GLUCAP in the last 168 hours.    Garner Nash, DO [facility] 08/14/2018 11:10 AM  Personal pager: [phone] If unanswered, please page CCM On-call: [phone]

## 2018-08-15 MED ORDER — ENALAPRILAT 1.25 MG/ML IV SOLN
0.6250 mg | Freq: Once | INTRAVENOUS | Status: AC
  Administered 2018-08-15: 0.625 mg via INTRAVENOUS
  Filled 2018-08-15: qty 1

## 2018-08-15 MED ORDER — HYDRALAZINE HCL 20 MG/ML IJ SOLN
5.0000 mg | Freq: Four times a day (QID) | INTRAMUSCULAR | Status: DC | PRN
  Administered 2018-08-15 – 2018-08-25 (×4): 5 mg via INTRAVENOUS
  Filled 2018-08-15 (×4): qty 1

## 2018-08-15 NOTE — Progress Notes (Signed)
PROGRESS NOTE  Courtney Ayers  PNT:[id number] DOB: 1946-01-11 DOA: 08/11/2018 PCP: [name], MD   Brief Narrative: Courtney Ayers is a 73 y.o. female with a history of chronic hypoxic respiratory failure, COPD, HTN, depression who presented with dyspnea and cough. She was prescribed prednisone and doxycycline after she reported to be too weak to present to pulmonology office visit but had GI side effects limiting this to a single dose. In the ED she was tachypneic, wheezing with fever of 101.2F. CXR without definite infiltrate, viral panel negative, no recent travel to implicate coronavirus. She was given IV steroids, ceftriaxone, and continues to feel severely dyspneic with oxygen requirement up to 5LPM. Her pulmonologist was consulted and recommendations given.  Assessment & Plan: Principal Problem:   COPD with acute exacerbation ([place]) Active Problems:   Acute on chronic respiratory failure with hypoxia (HCC)   Chronic respiratory failure with hypoxia (HCC)   HTN (hypertension)   Tachypnea  Acute on chronic hypoxic respiratory failure: Due to COPD exacerbation, presumed acute infectious process involved. No RVP at admission, though this could easily be cause of decompensation.  - Acutely worse 3/6 AM and remains tenuous on BiPAP. - Would NOT want prolonged measures unless she had a reasonable chance at return to current quality of life. - Morphine may also assist with dyspnea. - IV Mag given  - Added back azithromycin for antiinflammatory property  - Continue supplemental oxygen, but would not target SpO2 >92%. - Continue antibiotics, ceftriaxone empirically 3/3 - 3/7 and azithromycin as above.  - Continue IV steroids due to ongoing wheezing, high dose 60mg  IV q6h. - Continue breathing treatments.  - Appreciate pulmonology assistance with this patient. Changing to scheduled atrovent.  Depression, anxiety:  - Continue SSRI - Continue low dose xanax prn, newly added.  HTN:  Continue prn hydralazine  DVT prophylaxis: Lovenox Code Status: Partial - would want trial of intubation. Family Communication: None at bedside this AM. Disposition Plan: Continue SDU. Guarded prognosis  Consultants:   Pulmonology  Procedures:   None  Antimicrobials:  Ceftriaxone 3/3 - 3/7  Azithromycin 3/3   Subjective: Remains in tenuous position, back on BiPAP after some ~2hrs off this morning. ABG improved after BiPAP and pulmonary has evaluated the patient. Has no other complaints at this time.   Objective: Vitals:   08/15/18 1235 08/15/18 1400 08/15/18 1432 08/15/18 1500  BP:   (!) 158/84   Pulse:  88 91 91  Resp:  (!) 32 20 (!) 32  Temp: (!) 97.4 F (36.3 C)     TempSrc: Axillary     SpO2:  96% 93% 94%  Weight:      Height:        Intake/Output Summary (Last 24 hours) at 08/15/2018 1531 Last data filed at 08/15/2018 1500 Gross per 24 hour  Intake 561.23 ml  Output 350 ml  Net 211.23 ml   Filed Weights   08/11/18 1711 08/14/18 0918  Weight: 62.6 kg 63.6 kg   Gen: Ill-appearing female on BiPAP resting quietly.   Pulm: No wheezes, still prolonged expiration, not synchronized with BiPAP. Pursed lip breathing. CV: Regular rate and rhythm. No murmur, rub, or gallop. No JVD, no dependent edema. GI: Abdomen soft, non-tender, non-distended, with normoactive bowel sounds.  Ext: Warm, no deformities Skin: No rashes, lesions or ulcers on visualized skin. Neuro: Alert and oriented. No focal neurological deficits. Psych: Judgement and insight appear fair. Mood euthymic & affect congruent. Behavior is appropriate.  Data Reviewed: I have personally reviewed following labs and imaging studies  CBC: Recent Labs  Lab 08/11/18 1728 08/12/18 0454  WBC 8.2 5.5  NEUTROABS 7.5  --   HGB 13.8 13.0  HCT 44.1 41.6  MCV 98.4 99.5  PLT 182 124   Basic Metabolic Panel: Recent Labs  Lab 08/11/18 1728 08/12/18 0454  NA 136 141  K 3.6 4.5  CL 99 104  CO2 28 29    GLUCOSE 123* 158*  BUN 11 11  CREATININE 0.70 0.68  CALCIUM 9.1 9.1   Liver Function Tests: Recent Labs  Lab 08/11/18 1728 08/12/18 0454  AST 26 23  ALT 21 18  ALKPHOS 115 97  BILITOT 0.7 0.5  PROT 7.7 6.7  ALBUMIN 4.6 3.9   Recent Results (from the past 240 hour(s))  Culture, blood (routine x 2)     Status: None (Preliminary result)   Collection Time: 08/11/18  7:44 PM  Result Value Ref Range Status   Specimen Description   Final    BLOOD RIGHT ARM Performed at [facility], [place] [address] [place], [place] [address]    Special Requests   Final    BOTTLES DRAWN AEROBIC AND ANAEROBIC Blood Culture results may not be optimal due to an excessive volume of blood received in culture bottles Performed at [facility] [address] [place], [place] [address]    Culture   Final    NO GROWTH 4 DAYS Performed at [facility], [place] [address], [place], [place] [address]    Report Status PENDING  Incomplete  Culture, blood (routine x 2)     Status: None (Preliminary result)   Collection Time: 08/11/18  7:45 PM  Result Value Ref Range Status   Specimen Description   Final    BLOOD LEFT ANTECUBITAL Performed at [facility] [address], [place], [place] [address]    Special Requests   Final    BOTTLES DRAWN AEROBIC AND ANAEROBIC Blood Culture adequate volume Performed at [facility] [address], [place], [place] [address]    Culture   Final    NO GROWTH 4 DAYS Performed at [facility], [place] [address], [place], [place] [address]    Report Status PENDING  Incomplete  MRSA PCR Screening     Status: None   Collection Time: 08/14/18  1:27 PM  Result Value Ref Range Status   MRSA by PCR NEGATIVE NEGATIVE Final    Comment:        The GeneXpert MRSA Assay (FDA approved for NASAL specimens only), is one component of a comprehensive MRSA colonization surveillance program. It is  not intended to diagnose MRSA infection nor to guide or monitor treatment for MRSA infections. Performed at [facility], [place] [address] [place], [place] [address]       Radiology Studies: Dg Chest Port 1 View  Result Date: 08/14/2018 CLINICAL DATA:  Acute on chronic respiratory failure with hypoxia and hypercapnia. EXAM: PORTABLE CHEST 1 VIEW COMPARISON:  08/11/2018. FINDINGS: Cardiac silhouette is normal in size. No mediastinal or hilar masses or evidence of adenopathy. Lungs are hyperexpanded. Stable scarring most evident in the right upper lobe. No evidence of pneumonia or pulmonary edema. No pleural effusion or pneumothorax. Skeletal structures are grossly intact. IMPRESSION: 1. No acute cardiopulmonary disease. 2. Advanced COPD with chronic lung scarring. Electronically Signed   By: Lajean Manes M.D.   On: 08/14/2018  10:27   LOS: 4 days   Time spent: 35 minutes.  Patrecia Pour, MD [facility] [email or web address] Password [facility] 08/15/2018, 3:31 PM

## 2018-08-15 NOTE — Progress Notes (Signed)
NAME:  Courtney Ayers, MRN:  [id number], DOB:  05-11-1946, LOS: 4 ADMISSION DATE:  08/11/2018, CONSULTATION DATE: August 12, 2018 REFERRING MD: Dr. Bonner Puna, CHIEF COMPLAINT: Dyspnea  Brief History   73 year old female who sees Dr. Lake Bells in clinic for advanced COPD, 3 L oxygen baseline presented to [facility] long hospital with several days of chest congestion mucus production shortness of breath.  Has been treated for a COPD exacerbation.  Pulmonary consulted for ongoing dyspnea.  Past Medical History  COPD Chronic respiratory failure with hypoxemia Depression  Significant Hospital Events   3/03  Admit  3/04  PCCM consulted   Consults:  Pulmonary  Procedures:   Significant Diagnostic Tests:   Micro Data:  3/3 flu swab >> negative  Antimicrobials:  Ceftriaxone 3/3 >>  Interim history/subjective:  Remains on BiPAP with mild-moderate respiratory distress  Objective   Blood pressure 109/68, pulse 92, temperature (!) 97.3 F (36.3 C), temperature source Axillary, resp. rate (!) 36, height 5\' 7"  (1.702 m), weight 63.6 kg, last menstrual period 06/10/1992, SpO2 93 %.    FiO2 (%):  [25 %] 25 %   Intake/Output Summary (Last 24 hours) at 08/15/2018 0923 Last data filed at 08/15/2018 0800 Gross per 24 hour  Intake 569.5 ml  Output 350 ml  Net 219.5 ml   Filed Weights   08/11/18 1711 08/14/18 0918  Weight: 62.6 kg 63.6 kg    Examination: Gen:      Mild distress HEENT:  EOMI, sclera anicteric Neck:     No masses; no thyromegaly Lungs:    Diminished air entry, no wheeze appreciated today. CV:         Regular rate and rhythm; no murmurs Abd:      + bowel sounds; soft, non-tender; no palpable masses, no distension Ext:    No edema; adequate peripheral perfusion Skin:      Warm and dry; no rash Neuro: alert and oriented x 3 Psych: normal mood and affect  Resolved Hospital Problem list     Assessment & Plan:   73 year old female admitted for acute on chronic respiratory  failure with hypoxemia due to a COPD exacerbation.  Appears to be due to infectious etiology, no recent travel.  Acute Exacerbation of COPD Acute on chronic hypoxemic hypercarbic respiratory failure requiring NIPPV Patient is a durable DNI P: Continue Brovana, Pulmicort and scheduled duo nebs On ceftriaxone and azithromycin Continue Solu-Medrol 60 mg IV every 6 May attempt off BiPAP for an hour later today. Keep n.p.o.   Best practice:  Per [facility]   Labs    CBC: Recent Labs  Lab 08/11/18 1728 08/12/18 0454  WBC 8.2 5.5  NEUTROABS 7.5  --   HGB 13.8 13.0  HCT 44.1 41.6  MCV 98.4 99.5  PLT 182 517    Basic Metabolic Panel: Recent Labs  Lab 08/11/18 1728 08/12/18 0454  NA 136 141  K 3.6 4.5  CL 99 104  CO2 28 29  GLUCOSE 123* 158*  BUN 11 11  CREATININE 0.70 0.68  CALCIUM 9.1 9.1   GFR: Estimated Creatinine Clearance: 60.9 mL/min (by C-G formula based on SCr of 0.68 mg/dL). Recent Labs  Lab 08/11/18 1728 08/11/18 1944 08/12/18 0454  WBC 8.2  --  5.5  LATICACIDVEN  --  1.4  --     Liver Function Tests: Recent Labs  Lab 08/11/18 1728 08/12/18 0454  AST 26 23  ALT 21 18  ALKPHOS 115 97  BILITOT 0.7 0.5  PROT 7.7  6.7  ALBUMIN 4.6 3.9   No results for input(s): LIPASE, AMYLASE in the last 168 hours. No results for input(s): AMMONIA in the last 168 hours.  ABG    Component Value Date/Time   PHART 7.396 08/14/2018 1525   PCO2ART 63.5 (H) 08/14/2018 1525   PO2ART 64.6 (L) 08/14/2018 1525   HCO3 38.1 (H) 08/14/2018 1525   TCO2 37.3 08/07/2014 2335   O2SAT 92.6 08/14/2018 1525     Coagulation Profile: No results for input(s): INR, PROTIME in the last 168 hours.  Cardiac Enzymes: Recent Labs  Lab 08/11/18 1728  TROPONINI 0.05*    HbA1C: Hgb A1c MFr Bld  Date/Time Value Ref Range Status  07/01/2016 02:04 PM 5.1 <5.7 % Final    Comment:      For the purpose of screening for the presence of diabetes:   <5.7%       Consistent with the  absence of diabetes 5.7-6.4 %   Consistent with increased risk for diabetes (prediabetes) >=6.5 %     Consistent with diabetes   This assay result is consistent with a decreased risk of diabetes.   Currently, no consensus exists regarding use of hemoglobin A1c for diagnosis of diabetes in children.   According to American Diabetes Association (ADA) guidelines, hemoglobin A1c <7.0% represents optimal control in non-pregnant diabetic patients. Different metrics may apply to specific patient populations. Standards of Medical Care in Diabetes (ADA).     06/04/2016 08:26 PM 5.2 4.8 - 5.6 % Final    Comment:    (NOTE)         Pre-diabetes: 5.7 - 6.4         Diabetes: >6.4         Glycemic control for adults with diabetes: <7.0     CBG: No results for input(s): GLUCAP in the last 168 hours.  The patient is critically ill with multiple organ system failure and requires high complexity decision making for assessment and support, frequent evaluation and titration of therapies, advanced monitoring, review of radiographic studies and interpretation of complex data.   Critical Care Time devoted to patient care services, exclusive of separately billable procedures, described in this note is 35 minutes.   Marshell Garfinkel MD [facility] Pager [phone] If no answer call 336 [phone] 08/15/2018, 9:24 AM

## 2018-08-15 NOTE — Progress Notes (Signed)
Patient taken off BiPAP at this time and placed on 4 L [place]. O2 sats at this time 92%. RT will continue to monitor patient.

## 2018-08-16 LAB — CULTURE, BLOOD (ROUTINE X 2)
Culture: NO GROWTH
Culture: NO GROWTH
Special Requests: ADEQUATE

## 2018-08-16 LAB — BASIC METABOLIC PANEL
Anion gap: 8 (ref 5–15)
BUN: 26 mg/dL — ABNORMAL HIGH (ref 8–23)
CO2: 37 mmol/L — ABNORMAL HIGH (ref 22–32)
Calcium: 8.7 mg/dL — ABNORMAL LOW (ref 8.9–10.3)
Chloride: 97 mmol/L — ABNORMAL LOW (ref 98–111)
Creatinine, Ser: 0.51 mg/dL (ref 0.44–1.00)
GFR calc Af Amer: >60 mL/min (ref >60)
GFR calc non Af Amer: >60 mL/min (ref >60)
Glucose, Bld: 125 mg/dL — ABNORMAL HIGH (ref 70–99)
Potassium: 4 mmol/L (ref 3.5–5.1)
SODIUM: 142 mmol/L (ref 135–145)

## 2018-08-16 MED ORDER — SALINE SPRAY 0.65 % NA SOLN
1.0000 | NASAL | Status: DC | PRN
  Administered 2018-08-16: 1 via NASAL
  Filled 2018-08-16: qty 44

## 2018-08-16 MED ORDER — METHYLPREDNISOLONE SODIUM SUCC 125 MG IJ SOLR
60.0000 mg | Freq: Three times a day (TID) | INTRAMUSCULAR | Status: DC
  Administered 2018-08-16 – 2018-08-28 (×36): 60 mg via INTRAVENOUS
  Filled 2018-08-16 (×37): qty 2

## 2018-08-16 MED ORDER — IPRATROPIUM BROMIDE 0.06 % NA SOLN
2.0000 | Freq: Four times a day (QID) | NASAL | Status: DC
  Administered 2018-08-16 – 2018-09-03 (×44): 2 via NASAL
  Filled 2018-08-16: qty 15

## 2018-08-16 NOTE — Progress Notes (Signed)
NAME:  Courtney Ayers, MRN:  [id number], DOB:  22-Mar-1946, LOS: 5 ADMISSION DATE:  08/11/2018, CONSULTATION DATE: August 12, 2018 REFERRING MD: Dr. Bonner Puna, CHIEF COMPLAINT: Dyspnea  Brief History   73 year old female who sees Dr. Lake Bells in clinic for advanced COPD, 3 L oxygen baseline presented to [facility] long hospital with several days of chest congestion mucus production shortness of breath.  Has been treated for a COPD exacerbation.  Pulmonary consulted for ongoing dyspnea.  Past Medical History  COPD Chronic respiratory failure with hypoxemia Depression  Significant Hospital Events   3/03  Admit  3/04  PCCM consulted   Consults:  Pulmonary  Procedures:   Significant Diagnostic Tests:   Micro Data:  3/3 flu swab >> negative  Antimicrobials:  Ceftriaxone 3/3 >>  Interim history/subjective:  Was off BiPAP for 2.5 hours yesterday No acute events overnight States that dyspnea is stable.  Objective   Blood pressure (!) 170/95, pulse 97, temperature (!) 97.4 F (36.3 C), temperature source Axillary, resp. rate 13, height 5\' 7"  (1.702 m), weight 63.6 kg, last menstrual period 06/10/1992, SpO2 99 %.    FiO2 (%):  [25 %] 25 %   Intake/Output Summary (Last 24 hours) at 08/16/2018 0941 Last data filed at 08/16/2018 0800 Gross per 24 hour  Intake 342.93 ml  Output 250 ml  Net 92.93 ml   Filed Weights   08/11/18 1711 08/14/18 0918  Weight: 62.6 kg 63.6 kg    Examination: Gen:      No acute distress HEENT:  EOMI, sclera anicteric Neck:     No masses; no thyromegaly Lungs:    Diminished air entry, no wheeze CV:         Regular rate and rhythm; no murmurs Abd:      + bowel sounds; soft, non-tender; no palpable masses, no distension Ext:    No edema; adequate peripheral perfusion Skin:      Warm and dry; no rash Neuro: alert and oriented x 3 Psych: normal mood and affect  Resolved Hospital Problem list     Assessment & Plan:   73 year old female admitted for acute  on chronic respiratory failure with hypoxemia due to a COPD exacerbation.  Appears to be due to infectious etiology, no recent travel.  Acute Exacerbation of COPD Acute on chronic hypoxemic hypercarbic respiratory failure requiring NIPPV Patient is a durable DNI P: Continue Brovana, Pulmicort and scheduled duo nebs On ceftriaxone and azithromycin Continue Solu-Medrol. Reduce dose to 60 mg IV every 8hrs Give breaks off BiPAP during the day.  Best practice:  Per [facility]   Labs    CBC: Recent Labs  Lab 08/11/18 1728 08/12/18 0454  WBC 8.2 5.5  NEUTROABS 7.5  --   HGB 13.8 13.0  HCT 44.1 41.6  MCV 98.4 99.5  PLT 182 710    Basic Metabolic Panel: Recent Labs  Lab 08/11/18 1728 08/12/18 0454 08/16/18 0330  NA 136 141 142  K 3.6 4.5 4.0  CL 99 104 97*  CO2 28 29 37*  GLUCOSE 123* 158* 125*  BUN 11 11 26*  CREATININE 0.70 0.68 0.51  CALCIUM 9.1 9.1 8.7*   GFR: Estimated Creatinine Clearance: 60.9 mL/min (by C-G formula based on SCr of 0.51 mg/dL). Recent Labs  Lab 08/11/18 1728 08/11/18 1944 08/12/18 0454  WBC 8.2  --  5.5  LATICACIDVEN  --  1.4  --     Liver Function Tests: Recent Labs  Lab 08/11/18 1728 08/12/18 0454  AST 26  23  ALT 21 18  ALKPHOS 115 97  BILITOT 0.7 0.5  PROT 7.7 6.7  ALBUMIN 4.6 3.9   No results for input(s): LIPASE, AMYLASE in the last 168 hours. No results for input(s): AMMONIA in the last 168 hours.  ABG    Component Value Date/Time   PHART 7.396 08/14/2018 1525   PCO2ART 63.5 (H) 08/14/2018 1525   PO2ART 64.6 (L) 08/14/2018 1525   HCO3 38.1 (H) 08/14/2018 1525   TCO2 37.3 08/07/2014 2335   O2SAT 92.6 08/14/2018 1525     Coagulation Profile: No results for input(s): INR, PROTIME in the last 168 hours.  Cardiac Enzymes: Recent Labs  Lab 08/11/18 1728  TROPONINI 0.05*    HbA1C: Hgb A1c MFr Bld  Date/Time Value Ref Range Status  07/01/2016 02:04 PM 5.1 <5.7 % Final    Comment:      For the purpose of screening  for the presence of diabetes:   <5.7%       Consistent with the absence of diabetes 5.7-6.4 %   Consistent with increased risk for diabetes (prediabetes) >=6.5 %     Consistent with diabetes   This assay result is consistent with a decreased risk of diabetes.   Currently, no consensus exists regarding use of hemoglobin A1c for diagnosis of diabetes in children.   According to American Diabetes Association (ADA) guidelines, hemoglobin A1c <7.0% represents optimal control in non-pregnant diabetic patients. Different metrics may apply to specific patient populations. Standards of Medical Care in Diabetes (ADA).     06/04/2016 08:26 PM 5.2 4.8 - 5.6 % Final    Comment:    (NOTE)         Pre-diabetes: 5.7 - 6.4         Diabetes: >6.4         Glycemic control for adults with diabetes: <7.0     CBG: No results for input(s): GLUCAP in the last 168 hours.  Marshell Garfinkel MD [place] [facility] 08/16/2018, 9:43 AM

## 2018-08-16 NOTE — Progress Notes (Signed)
PROGRESS NOTE  Courtney Ayers  ZYS:[id number] DOB: 02-20-1946 DOA: 08/11/2018 PCP: [name], MD   Brief Narrative: Courtney Ayers is a 73 y.o. female with a history of chronic hypoxic respiratory failure, COPD, HTN, depression who presented with dyspnea and cough. She was prescribed prednisone and doxycycline after she reported to be too weak to present to pulmonology office visit but had GI side effects limiting this to a single dose. In the ED she was tachypneic, wheezing with fever of 101.16F. CXR without definite infiltrate, viral panel negative, no recent travel to implicate coronavirus. She was given IV steroids, ceftriaxone, and continues to feel severely dyspneic with oxygen requirement up to 5LPM. Her pulmonologist was consulted and recommendations given.  Assessment & Plan: Principal Problem:   COPD with acute exacerbation ([place]) Active Problems:   Acute on chronic respiratory failure with hypoxia (HCC)   Chronic respiratory failure with hypoxia (HCC)   HTN (hypertension)   Tachypnea  Acute on chronic hypoxic respiratory failure: Due to COPD exacerbation, presumed acute infectious process involved. No RVP at admission, though this could easily be cause of decompensation.  - Acutely worse 3/6 AM and remains tenuous on BiPAP in SDU, PCCM following.Continue BiPAP today with short trials off. - Would NOT want prolonged measures unless she had a reasonable chance at return to current quality of life. - Morphine may also assist with dyspnea. - Continue supplemental oxygen, but would not target SpO2 >92%. - Continue antibiotics, ceftriaxone empirically 3/3 - 3/7 and azithromycin 3/6 - 3/10. - Continue IV steroids. No further wheezing, deescalate today.  - Continue breathing treatments.   Depression, anxiety:  - Continue SSRI - Continue low dose xanax prn, newly added.  HTN: Continue prn hydralazine  DVT prophylaxis: Lovenox Code Status: Partial - would want trial of  intubation. Family Communication: None at bedside this AM. Disposition Plan: Continue SDU. Guarded prognosis.  Consultants:   Pulmonology  Procedures:   None  Antimicrobials:  Ceftriaxone 3/3 - 3/7  Azithromycin 3/3   Subjective: Remains short of breath, stable from last 24 hours, tolerated off BiPAP for about 2 hours yesterday. She's thirsty.   Objective: Vitals:   08/16/18 1200 08/16/18 1244 08/16/18 1300 08/16/18 1400  BP: 110/79   (!) 165/83  Pulse: (!) 101  96 92  Resp: 18  (!) 25 (!) 23  Temp:  (!) 97.4 F (36.3 C)    TempSrc:  Axillary    SpO2: 95%  100% 97%  Weight:      Height:        Intake/Output Summary (Last 24 hours) at 08/16/2018 1542 Last data filed at 08/16/2018 1441 Gross per 24 hour  Intake 563.35 ml  Output 252 ml  Net 311.35 ml   Filed Weights   08/11/18 1711 08/14/18 0918  Weight: 62.6 kg 63.6 kg   Gen: 73 y.o. female calm on BiPAP sitting straight up. Pulm: Very diminished throughout without wheezing. CV: Regular rate and rhythm. No murmur, rub, or gallop. No JVD, no dependent edema. GI: Abdomen soft, non-tender, non-distended, with normoactive bowel sounds.  Ext: Warm, no deformities Skin: No rashes, lesions or ulcers on visualized skin. Neuro: Alert and oriented. No focal neurological deficits. Psych: Judgement and insight appear fair. Mood euthymic & affect congruent. Behavior is appropriate.    Data Reviewed: I have personally reviewed following labs and imaging studies  CBC: Recent Labs  Lab 08/11/18 1728 08/12/18 0454  WBC 8.2 5.5  NEUTROABS 7.5  --   HGB  13.8 13.0  HCT 44.1 41.6  MCV 98.4 99.5  PLT 182 793   Basic Metabolic Panel: Recent Labs  Lab 08/11/18 1728 08/12/18 0454 08/16/18 0330  NA 136 141 142  K 3.6 4.5 4.0  CL 99 104 97*  CO2 28 29 37*  GLUCOSE 123* 158* 125*  BUN 11 11 26*  CREATININE 0.70 0.68 0.51  CALCIUM 9.1 9.1 8.7*   Liver Function Tests: Recent Labs  Lab 08/11/18 1728 08/12/18 0454    AST 26 23  ALT 21 18  ALKPHOS 115 97  BILITOT 0.7 0.5  PROT 7.7 6.7  ALBUMIN 4.6 3.9   Recent Results (from the past 240 hour(s))  Culture, blood (routine x 2)     Status: None   Collection Time: 08/11/18  7:44 PM  Result Value Ref Range Status   Specimen Description   Final    BLOOD RIGHT ARM Performed at [facility], [place] [address] [place], [place] [address]    Special Requests   Final    BOTTLES DRAWN AEROBIC AND ANAEROBIC Blood Culture results may not be optimal due to an excessive volume of blood received in culture bottles Performed at [place] [address], [place], [place] [address]    Culture   Final    NO GROWTH 5 DAYS Performed at [facility], [place] [address] [place], [place] [address]    Report Status 08/16/2018 FINAL  Final  Culture, blood (routine x 2)     Status: None   Collection Time: 08/11/18  7:45 PM  Result Value Ref Range Status   Specimen Description   Final    BLOOD LEFT ANTECUBITAL Performed at [place] [address], [place], [place] [address]    Special Requests   Final    BOTTLES DRAWN AEROBIC AND ANAEROBIC Blood Culture adequate volume Performed at [facility] [address], [place], [place] [address]    Culture   Final    NO GROWTH 5 DAYS Performed at [facility], [place] [address], [place], [place] [address]    Report Status 08/16/2018 FINAL  Final  MRSA PCR Screening     Status: None   Collection Time: 08/14/18  1:27 PM  Result Value Ref Range Status   MRSA by PCR NEGATIVE NEGATIVE Final    Comment:        The GeneXpert MRSA Assay (FDA approved for NASAL specimens only), is one component of a comprehensive MRSA colonization surveillance program. It is not intended to diagnose MRSA infection nor to guide or monitor treatment for MRSA infections. Performed at [facility], [place] [address], [place], [place]  [address]       Radiology Studies: No results found. LOS: 5 days   Time spent: 35 minutes.  Patrecia Pour, MD [facility] [email or web address] Password [facility] 08/16/2018, 3:42 PM

## 2018-08-17 NOTE — Progress Notes (Signed)
NAME:  ANH BIGOS, MRN:  [id number], DOB:  [birth year], LOS: 6 ADMISSION DATE:  08/11/2018, CONSULTATION DATE: August 12, 2018 REFERRING MD: Dr. Bonner Puna, CHIEF COMPLAINT: Dyspnea  Brief History   73 year old female who sees Dr. Lake Bells in clinic for advanced COPD, 3 L oxygen baseline presented to [facility] with several days of chest congestion mucus production shortness of breath.  Has been treated for a COPD exacerbation.  Pulmonary consulted for ongoing dyspnea.  Past Medical History  COPD Chronic respiratory failure with hypoxemia Depression  Significant Hospital Events   3/03  Admit  3/04  PCCM consulted  3/06  Tx to SDU for WOB/SOB 3/07  Off BiPAP for 2.5 hours    Consults:  Pulmonary  Procedures:    Significant Diagnostic Tests:    Micro Data:  3/3 flu swab >> negative  Antimicrobials:  Ceftriaxone 3/3 >> 3/6 Azithro 3/6 >> 3/11  Interim history/subjective:  Pt reports limited reserve for talking / physical activity.  Has gone to the bathroom x2.    Objective   Blood pressure (!) 160/82, pulse 92, temperature 98.2 F (36.8 C), temperature source Axillary, resp. rate 12, height 5\' 7"  (7.341 m), weight 63.6 kg, last menstrual period 06/10/1992, SpO2 94 %.    FiO2 (%):  [25 %] 25 %   Intake/Output Summary (Last 24 hours) at 08/17/2018 0926 Last data filed at 08/17/2018 0900 Gross per 24 hour  Intake 603.68 ml  Output 102 ml  Net 501.68 ml   Filed Weights   08/11/18 1711 08/14/18 0918  Weight: 62.6 kg 63.6 kg    Examination: General: chronically ill appearing female sitting up in bed  HEENT: MM pink/moist, no jvd, [place] O2 Neuro: AAOx4, speech clear, MAE CV: s1s2 rrr, no m/r/g PULM: prolonged exp phase, pursed lip breathing, [place] O2, lungs bilaterally diminished, no wheezing (improved from 3/5 exam) PF:XTKW, non-tender, bsx4 active  Extremities: warm/dry, no edema  Skin: no rashes or lesions  Resolved Hospital Problem list     Assessment &  Plan:   73 year old female admitted for acute on chronic respiratory failure with hypoxemia due to a COPD exacerbation.  Appears to be due to infectious etiology, no recent travel.  Acute Exacerbation of COPD Acute on chronic hypoxemic hypercarbic respiratory failure requiring NIPPV -patient is a DNI P: Continue Brovana + Pulmicort, scheduled duonebs Rocephin + Azithro as above  Solumedrol 60 mg IV Q8 Continue singulair Intermittent BiPAP during day for relief of WOB, QHS mandatory  Follow intermittent CXR   Best practice:  Per [facility]   Labs    CBC: Recent Labs  Lab 08/11/18 1728 08/12/18 0454  WBC 8.2 5.5  NEUTROABS 7.5  --   HGB 13.8 13.0  HCT 44.1 41.6  MCV 98.4 99.5  PLT 182 409    Basic Metabolic Panel: Recent Labs  Lab 08/11/18 1728 08/12/18 0454 08/16/18 0330  NA 136 141 142  K 3.6 4.5 4.0  CL 99 104 97*  CO2 28 29 37*  GLUCOSE 123* 158* 125*  BUN 11 11 26*  CREATININE 0.70 0.68 0.51  CALCIUM 9.1 9.1 8.7*   GFR: Estimated Creatinine Clearance: 60.9 mL/min (by C-G formula based on SCr of 0.51 mg/dL). Recent Labs  Lab 08/11/18 1728 08/11/18 1944 08/12/18 0454  WBC 8.2  --  5.5  LATICACIDVEN  --  1.4  --     Liver Function Tests: Recent Labs  Lab 08/11/18 1728 08/12/18 0454  AST 26 23  ALT 21 18  ALKPHOS 115 97  BILITOT 0.7 0.5  PROT 7.7 6.7  ALBUMIN 4.6 3.9   No results for input(s): LIPASE, AMYLASE in the last 168 hours. No results for input(s): AMMONIA in the last 168 hours.  ABG    Component Value Date/Time   PHART 7.396 08/14/2018 1525   PCO2ART 63.5 (H) 08/14/2018 1525   PO2ART 64.6 (L) 08/14/2018 1525   HCO3 38.1 (H) 08/14/2018 1525   TCO2 37.3 08/07/2014 2335   O2SAT 92.6 08/14/2018 1525     Coagulation Profile: No results for input(s): INR, PROTIME in the last 168 hours.  Cardiac Enzymes: Recent Labs  Lab 08/11/18 1728  TROPONINI 0.05*    HbA1C: Hgb A1c MFr Bld  Date/Time Value Ref Range Status  07/01/2016  02:04 PM 5.1 <5.7 % Final    Comment:      For the purpose of screening for the presence of diabetes:   <5.7%       Consistent with the absence of diabetes 5.7-6.4 %   Consistent with increased risk for diabetes (prediabetes) >=6.5 %     Consistent with diabetes   This assay result is consistent with a decreased risk of diabetes.   Currently, no consensus exists regarding use of hemoglobin A1c for diagnosis of diabetes in children.   According to American Diabetes Association (ADA) guidelines, hemoglobin A1c <7.0% represents optimal control in non-pregnant diabetic patients. Different metrics may apply to specific patient populations. Standards of Medical Care in Diabetes (ADA).     06/04/2016 08:26 PM 5.2 4.8 - 5.6 % Final    Comment:    (NOTE)         Pre-diabetes: 5.7 - 6.4         Diabetes: >6.4         Glycemic control for adults with diabetes: <7.0     CBG: No results for input(s): GLUCAP in the last 168 hours.    Noe Gens, NP-C [place] Pgr: [phone] or if no answer [phone] 08/17/2018, 9:35 AM

## 2018-08-17 NOTE — Progress Notes (Signed)
PROGRESS NOTE  Courtney Ayers  FOY:[id number] DOB: 01/05/1946 DOA: 08/11/2018 PCP: [name], MD   Brief Narrative: Courtney Ayers is a 73 y.o. female with a history of chronic hypoxic respiratory failure, COPD, HTN, depression who presented with dyspnea and cough. She was prescribed prednisone and doxycycline after she reported to be too weak to present to pulmonology office visit but had GI side effects limiting this to a single dose. In the ED she was tachypneic, wheezing with fever of 101.98F. CXR without definite infiltrate, viral panel negative, no recent travel to implicate coronavirus. She was given IV steroids, ceftriaxone, and continues to feel severely dyspneic with oxygen requirement up to 5LPM. Her pulmonologist was consulted and recommendations given.  Assessment & Plan: Principal Problem:   COPD with acute exacerbation ([place]) Active Problems:   Acute on chronic respiratory failure with hypoxia (HCC)   Chronic respiratory failure with hypoxia (HCC)   HTN (hypertension)   Tachypnea  Acute on chronic hypoxic respiratory failure: Due to COPD exacerbation, presumed acute infectious process involved. No RVP at admission, though this could easily be cause of decompensation.  - Acutely worse 3/6 AM. Continues on intermittent BiPAP. Apprecaite pulmonology assistance with management. - Would NOT want prolonged measures unless she had a reasonable chance at return to current quality of life. - Morphine may also assist with dyspnea. - Continue supplemental oxygen, but would not target SpO2 >92%. - Continue antibiotics, ceftriaxone empirically 3/3 - 3/7 and azithromycin 3/6 - 3/10. - Continue IV steroids. No further wheezing. - Continue breathing treatments.   Depression, anxiety:  - Continue SSRI - Continue low dose xanax prn, newly added.  HTN: Continue prn hydralazine  DVT prophylaxis: Lovenox Code Status: Partial - tell me she would want trial of intubation if need  be. Family Communication: None at bedside this AM. Disposition Plan: Continue SDU. Guarded prognosis.  Consultants:   Pulmonology  Procedures:   None  Antimicrobials:  Ceftriaxone 3/3 - 3/7  Azithromycin 3/3   Subjective: She's hungry and thirsty but remains very short of breath. This is stable. Trial off BiPAP for about 45 minutes at time of exam.    Objective: Vitals:   08/17/18 0909 08/17/18 0936 08/17/18 1000 08/17/18 1100  BP:   (!) 143/82 (!) 142/78  Pulse:  97 96 95  Resp:  10 17 (!) 23  Temp:      TempSrc:      SpO2: 94% 94% 94% 98%  Weight:      Height:        Intake/Output Summary (Last 24 hours) at 08/17/2018 1201 Last data filed at 08/17/2018 1000 Gross per 24 hour  Intake 713.69 ml  Output 102 ml  Net 611.69 ml   Filed Weights   08/11/18 1711 08/14/18 0918  Weight: 62.6 kg 63.6 kg   Gen: 73 y.o. female in no distress, using flutter valve Pulm: Very prolonged expiratory phase, no accessory muscle use recently following getting of BiPAP. No wheezing or rhonchi, remains very diminished. CV: Regular rate and rhythm. No murmur, rub, or gallop. No JVD, no dependent edema. GI: Abdomen soft, non-tender, non-distended, with normoactive bowel sounds.  Ext: Warm, no deformities Skin: No rashes, lesions or ulcers on visualized skin. Neuro: Alert and oriented. No focal neurological deficits. Psych: Judgement and insight appear fair. Mood euthymic & affect congruent. Behavior is appropriate.    Data Reviewed: I have personally reviewed following labs and imaging studies  CBC: Recent Labs  Lab 08/11/18 1728 08/12/18  0454  WBC 8.2 5.5  NEUTROABS 7.5  --   HGB 13.8 13.0  HCT 44.1 41.6  MCV 98.4 99.5  PLT 182 233   Basic Metabolic Panel: Recent Labs  Lab 08/11/18 1728 08/12/18 0454 08/16/18 0330  NA 136 141 142  K 3.6 4.5 4.0  CL 99 104 97*  CO2 28 29 37*  GLUCOSE 123* 158* 125*  BUN 11 11 26*  CREATININE 0.70 0.68 0.51  CALCIUM 9.1 9.1 8.7*    Liver Function Tests: Recent Labs  Lab 08/11/18 1728 08/12/18 0454  AST 26 23  ALT 21 18  ALKPHOS 115 97  BILITOT 0.7 0.5  PROT 7.7 6.7  ALBUMIN 4.6 3.9   Recent Results (from the past 240 hour(s))  Culture, blood (routine x 2)     Status: None   Collection Time: 08/11/18  7:44 PM  Result Value Ref Range Status   Specimen Description   Final    BLOOD RIGHT ARM Performed at [facility], [place] [address] [place], [place] [address]    Special Requests   Final    BOTTLES DRAWN AEROBIC AND ANAEROBIC Blood Culture results may not be optimal due to an excessive volume of blood received in culture bottles Performed at [place] [address], [place], [place] [address]    Culture   Final    NO GROWTH 5 DAYS Performed at [facility], [place] [address] [place], [place] [address]    Report Status 08/16/2018 FINAL  Final  Culture, blood (routine x 2)     Status: None   Collection Time: 08/11/18  7:45 PM  Result Value Ref Range Status   Specimen Description   Final    BLOOD LEFT ANTECUBITAL Performed at [place] [address] [place], [place] [address]    Special Requests   Final    BOTTLES DRAWN AEROBIC AND ANAEROBIC Blood Culture adequate volume Performed at [place] [address], [place], [place] [address]    Culture   Final    NO GROWTH 5 DAYS Performed at [facility], [place] [address], [place], [place] [address]    Report Status 08/16/2018 FINAL  Final  MRSA PCR Screening     Status: None   Collection Time: 08/14/18  1:27 PM  Result Value Ref Range Status   MRSA by PCR NEGATIVE NEGATIVE Final    Comment:        The GeneXpert MRSA Assay (FDA approved for NASAL specimens only), is one component of a comprehensive MRSA colonization surveillance program. It is not intended to diagnose MRSA infection nor to guide or monitor treatment for MRSA infections. Performed  at [facility], [place] [address], [place], [place] [address]       Radiology Studies: No results found. LOS: 6 days   Time spent: 25 minutes.  Patrecia Pour, MD [facility] [email or web address] Password [email or web address] 08/17/2018, 12:01 PM

## 2018-08-18 ENCOUNTER — Ambulatory Visit: Payer: Medicare Other | Admitting: Pulmonary Disease

## 2018-08-18 LAB — RESPIRATORY PANEL BY PCR
ADENOVIRUS-RVPPCR: NOT DETECTED
Bordetella pertussis: NOT DETECTED
Chlamydophila pneumoniae: NOT DETECTED
Coronavirus 229E: NOT DETECTED
Coronavirus HKU1: NOT DETECTED
Coronavirus NL63: NOT DETECTED
Coronavirus OC43: NOT DETECTED
Influenza A: NOT DETECTED
Influenza B: NOT DETECTED
Metapneumovirus: NOT DETECTED
Mycoplasma pneumoniae: NOT DETECTED
PARAINFLUENZA VIRUS 3-RVPPCR: NOT DETECTED
Parainfluenza Virus 1: NOT DETECTED
Parainfluenza Virus 2: NOT DETECTED
Parainfluenza Virus 4: NOT DETECTED
RESPIRATORY SYNCYTIAL VIRUS-RVPPCR: NOT DETECTED
RHINOVIRUS / ENTEROVIRUS - RVPPCR: NOT DETECTED

## 2018-08-18 LAB — CBC
HCT: 44.1 % (ref 36.0–46.0)
Hemoglobin: 13.4 g/dL (ref 12.0–15.0)
MCH: 30.9 pg (ref 26.0–34.0)
MCHC: 30.4 g/dL (ref 30.0–36.0)
MCV: 101.6 fL — ABNORMAL HIGH (ref 80.0–100.0)
Platelets: 198 10*3/uL (ref 150–400)
RBC: 4.34 MIL/uL (ref 3.87–5.11)
RDW: 13.1 % (ref 11.5–15.5)
WBC: 15.1 10*3/uL — ABNORMAL HIGH (ref 4.0–10.5)
nRBC: 0 % (ref 0.0–0.2)

## 2018-08-18 LAB — BASIC METABOLIC PANEL
Anion gap: 7 (ref 5–15)
BUN: 23 mg/dL (ref 8–23)
CO2: 38 mmol/L — ABNORMAL HIGH (ref 22–32)
Calcium: 8.5 mg/dL — ABNORMAL LOW (ref 8.9–10.3)
Chloride: 96 mmol/L — ABNORMAL LOW (ref 98–111)
Creatinine, Ser: 0.51 mg/dL (ref 0.44–1.00)
GFR calc Af Amer: >60 mL/min (ref >60)
Glucose, Bld: 120 mg/dL — ABNORMAL HIGH (ref 70–99)
POTASSIUM: 4.2 mmol/L (ref 3.5–5.1)
Sodium: 141 mmol/L (ref 135–145)

## 2018-08-18 MED ORDER — OXYMETAZOLINE HCL 0.05 % NA SOLN
1.0000 | Freq: Two times a day (BID) | NASAL | Status: DC | PRN
  Administered 2018-08-21 – 2018-09-02 (×5): 1 via NASAL

## 2018-08-18 MED ORDER — SALINE SPRAY 0.65 % NA SOLN
1.0000 | NASAL | Status: DC | PRN
  Administered 2018-08-23: 1 via NASAL

## 2018-08-18 NOTE — Progress Notes (Addendum)
NAME:  Courtney Ayers, MRN:  [id number], DOB:  September 24, 1945, LOS: 7 ADMISSION DATE:  08/11/2018, CONSULTATION DATE: August 12, 2018 REFERRING MD: Dr. Bonner Puna, CHIEF COMPLAINT: Dyspnea  Brief History   73 year old female who sees Dr. [place] Bells in clinic for advanced COPD - fev 31% in 2016 with dlco 25%, 3 L oxygen baseline presented to [facility] hospital with several days of chest congestion mucus production shortness of breath.  Has been treated for a COPD exacerbation.  Pulmonary consulted for ongoing dyspnea.  Past Medical History  COPD Chronic respiratory failure with hypoxemia Depression  Significant Hospital Events   3/03  Admit  3/04  PCCM consulted  3/06  Tx to SDU for WOB/SOB 3/07  Off BiPAP for 2.5 hours   3/9 - Pt reports limited reserve for talking / physical activity.  Has gone to the bathroom x2.    Consults:  Pulmonary  Procedures:    Significant Diagnostic Tests:    Micro Data:  3/3 flu swab >> negative  Antimicrobials:  Ceftriaxone 3/3 >> 3/6 Azithro 3/6 >> 3/11  Interim history/subjective:    3/10 - off bipap and asking to use only o2 but in class 4 dyspnea  Objective   Blood pressure (!) 167/90, pulse 90, temperature 97.7 F (36.5 C), temperature source Oral, resp. rate 16, height 5\' 7"  (1.702 m), weight 63.6 kg, last menstrual period 06/10/1992, SpO2 94 %.    FiO2 (%):  [25 %] 25 %   Intake/Output Summary (Last 24 hours) at 08/18/2018 1055 Last data filed at 08/18/2018 1046 Gross per 24 hour  Intake 504.7 ml  Output 400 ml  Net 104.7 ml   Filed Weights   08/11/18 1711 08/14/18 0918  Weight: 62.6 kg 63.6 kg    Examination: General Appearance:  Looks criticall ill +., Emaciated +, In bed near 90 degree position.  Head:  Normocephalic, without obvious abnormality, atraumatic Eyes:  PERRL - yes, conjunctiva/corneas - muddy     Ears:  Normal external ear canals, both ears Nose:  G tube - no but has [place] O2 + Throat:  ETT TUBE - no , OG tube -  no Neck:  Supple,  No enlargement/tenderness/nodules Lungs: Clear to auscultation bilaterally,  BARRELL CHEST, PURSE LIP BREATHING +, AIR TRAPPING +. DIMINISHED BREATH SOUNDS + Heart:  S1 and S2 normal, no murmur, CVP - no.  Pressors - no Abdomen:  Soft, no masses, no organomegaly Genitalia / Rectal:  Not done Extremities:  Extremities- intact Skin:  ntact in exposed areas . Sacral area - not examined Neurologic:  Sedation - none -> RASS - +1 . Moves all 4s - yes. CAM-ICU - not tested . Orientation - not tested. Sleeps but easily arousable      Resolved Hospital Problem list     Assessment & Plan:   73 year old female admitted for acute on chronic respiratory failure with hypoxemia due to a COPD exacerbation.  Appears to be due to infectious etiology, no recent travel.  Acute Exacerbation of COPD Acute on chronic hypoxemic hypercarbic respiratory failure requiring NIPPV - patient is a DNI  3/10 - clinically seems frail and not improving and class 4 dyspnea with poor air entry . Very slow improvement only  P: Continue Brovana + Pulmicort, scheduled duonebs Rocephin + Azithro as above  Solumedrol 60 mg IV Q8 Continue singulair BiPAP QHS defnite + day time 1h on + 4h off atleast - if not more in day time Check RVP to establish etiology (flu negative)  Check ABG 08/19/18 as prognostic sign Informed [facility] hospitalist to call palliative care for transition to comfort if not improved/worsening    *PCCM wil lround as needed  Best practice:  Per [facility]   Labs    CBC: Recent Labs  Lab 08/11/18 1728 08/12/18 0454 08/18/18 0658  WBC 8.2 5.5 15.1*  NEUTROABS 7.5  --   --   HGB 13.8 13.0 13.4  HCT 44.1 41.6 44.1  MCV 98.4 99.5 101.6*  PLT 182 185 443    Basic Metabolic Panel: Recent Labs  Lab 08/11/18 1728 08/12/18 0454 08/16/18 0330 08/18/18 0658  NA 136 141 142 141  K 3.6 4.5 4.0 4.2  CL 99 104 97* 96*  CO2 28 29 37* 38*  GLUCOSE 123* 158* 125* 120*  BUN 11 11  26* 23  CREATININE 0.70 0.68 0.51 0.51  CALCIUM 9.1 9.1 8.7* 8.5*   GFR: Estimated Creatinine Clearance: 60.9 mL/min (by C-G formula based on SCr of 0.51 mg/dL). Recent Labs  Lab 08/11/18 1728 08/11/18 1944 08/12/18 0454 08/18/18 0658  WBC 8.2  --  5.5 15.1*  LATICACIDVEN  --  1.4  --   --     Liver Function Tests: Recent Labs  Lab 08/11/18 1728 08/12/18 0454  AST 26 23  ALT 21 18  ALKPHOS 115 97  BILITOT 0.7 0.5  PROT 7.7 6.7  ALBUMIN 4.6 3.9   No results for input(s): LIPASE, AMYLASE in the last 168 hours. No results for input(s): AMMONIA in the last 168 hours.  ABG    Component Value Date/Time   PHART 7.396 08/14/2018 1525   PCO2ART 63.5 (H) 08/14/2018 1525   PO2ART 64.6 (L) 08/14/2018 1525   HCO3 38.1 (H) 08/14/2018 1525   TCO2 37.3 08/07/2014 2335   O2SAT 92.6 08/14/2018 1525     Coagulation Profile: No results for input(s): INR, PROTIME in the last 168 hours.  Cardiac Enzymes: Recent Labs  Lab 08/11/18 1728  TROPONINI 0.05*    HbA1C: Hgb A1c MFr Bld  Date/Time Value Ref Range Status  07/01/2016 02:04 PM 5.1 <5.7 % Final    Comment:      For the purpose of screening for the presence of diabetes:   <5.7%       Consistent with the absence of diabetes 5.7-6.4 %   Consistent with increased risk for diabetes (prediabetes) >=6.5 %     Consistent with diabetes   This assay result is consistent with a decreased risk of diabetes.   Currently, no consensus exists regarding use of hemoglobin A1c for diagnosis of diabetes in children.   According to American Diabetes Association (ADA) guidelines, hemoglobin A1c <7.0% represents optimal control in non-pregnant diabetic patients. Different metrics may apply to specific patient populations. Standards of Medical Care in Diabetes (ADA).     06/04/2016 08:26 PM 5.2 4.8 - 5.6 % Final    Comment:    (NOTE)         Pre-diabetes: 5.7 - 6.4         Diabetes: >6.4         Glycemic control for adults with  diabetes: <7.0     CBG: No results for input(s): GLUCAP in the last 168 hours.     SIGNATURE    Dr. Brand Males, M.D., F.C.C.P,  Pulmonary and Critical Care Medicine Staff Physician, [place] Director - Interstitial Lung Disease  Program  Pulmonary [place] [facility] [place], [place], [address]  Pager:  [phone], If no answer or between  15:00h - 7:00h: call [phone] Telephone: [phone]  10:55 AM 08/18/2018

## 2018-08-18 NOTE — Progress Notes (Signed)
PROGRESS NOTE  Courtney Ayers  NUU:[id number] DOB: May 05, 1946 DOA: 08/11/2018 PCP: [name], MD   Brief Narrative: Courtney Ayers is a 73 y.o. female with a history of chronic hypoxic respiratory failure, COPD, HTN, depression who presented with dyspnea and cough. She was prescribed prednisone and doxycycline after she reported to be too weak to present to pulmonology office visit but had GI side effects limiting this to a single dose. In the ED she was tachypneic, wheezing with fever of 101.50F. CXR without definite infiltrate, viral panel negative, no recent travel to implicate coronavirus. She was given IV steroids, ceftriaxone, and continues to feel severely dyspneic with oxygen requirement up to 5LPM. Her pulmonologist was consulted and recommendations given.  Assessment & Plan: Principal Problem:   COPD with acute exacerbation ([place]) Active Problems:   Acute on chronic respiratory failure with hypoxia (HCC)   Chronic respiratory failure with hypoxia (HCC)   HTN (hypertension)   Tachypnea  Acute on chronic hypoxic respiratory failure: Due to COPD exacerbation, presumed acute infectious process involved. No RVP at admission, though this could easily be cause of decompensation.  - Continue mostly BiPAP support with rest off with nasal cannula. Discussed with pulmonology who recommended palliative care. - Morphine may also assist with dyspnea. - Continue supplemental oxygen, but would not target SpO2 >92%. - Continue antibiotics, ceftriaxone empirically 3/3 - 3/7 and azithromycin 3/6 - 3/10. - Continue IV steroids. No further wheezing, but remains severely diminished. - Continue breathing treatments.   Depression, anxiety:  - Continue SSRI - Continue low dose xanax prn, newly added.  HTN: Continue prn hydralazine  DVT prophylaxis: Lovenox Code Status: Partial code Family Communication: None at bedside this AM. Disposition Plan: Continue SDU. Guarded prognosis, has  continued to fail to improve. Will involve palliative care for help with symptoms and to provide continuity with goals of care discussions.  Consultants:   Pulmonology  Palliative care team  Procedures:   None  Antimicrobials:  Ceftriaxone 3/3 - 3/7  Azithromycin 3/3   Subjective: Still requiring BiPAP >90% of the time. "About the same." No chest pain, leg swelling.  Objective: Vitals:   08/18/18 1034 08/18/18 1100 08/18/18 1155 08/18/18 1200  BP:  (!) 148/85  128/65  Pulse:  83  74  Resp:  (!) 28  (!) 26  Temp:   97.7 F (36.5 C)   TempSrc:   Oral   SpO2: 94% 100%  100%  Weight:      Height:        Intake/Output Summary (Last 24 hours) at 08/18/2018 1237 Last data filed at 08/18/2018 1207 Gross per 24 hour  Intake 638.2 ml  Output 400 ml  Net 238.2 ml   Filed Weights   08/11/18 1711 08/14/18 0918  Weight: 62.6 kg 63.6 kg   Gen: 73 y.o. female in no acute distress, clear dyspnea at rest Pulm: Labored, tachypneic, measured breathing with very diminished breath sounds, no wheezes, prolonged expiratory phase. CV: Regular rate and rhythm. No murmur, rub, or gallop. No JVD, no dependent edema. GI: Abdomen soft, non-tender, non-distended, with normoactive bowel sounds.  Ext: Warm, no deformities Skin: No rashes, lesions or ulcers on visualized skin. Neuro: Alert and oriented. No focal neurological deficits. Psych: Judgement and insight appear fair. Mood euthymic & affect congruent. Behavior is appropriate.    Data Reviewed: I have personally reviewed following labs and imaging studies  CBC: Recent Labs  Lab 08/11/18 1728 08/12/18 0454 08/18/18 0658  WBC 8.2 5.5  15.1*  NEUTROABS 7.5  --   --   HGB 13.8 13.0 13.4  HCT 44.1 41.6 44.1  MCV 98.4 99.5 101.6*  PLT 182 185 102   Basic Metabolic Panel: Recent Labs  Lab 08/11/18 1728 08/12/18 0454 08/16/18 0330 08/18/18 0658  NA 136 141 142 141  K 3.6 4.5 4.0 4.2  CL 99 104 97* 96*  CO2 28 29 37* 38*    GLUCOSE 123* 158* 125* 120*  BUN 11 11 26* 23  CREATININE 0.70 0.68 0.51 0.51  CALCIUM 9.1 9.1 8.7* 8.5*   Liver Function Tests: Recent Labs  Lab 08/11/18 1728 08/12/18 0454  AST 26 23  ALT 21 18  ALKPHOS 115 97  BILITOT 0.7 0.5  PROT 7.7 6.7  ALBUMIN 4.6 3.9   Recent Results (from the past 240 hour(s))  Culture, blood (routine x 2)     Status: None   Collection Time: 08/11/18  7:44 PM  Result Value Ref Range Status   Specimen Description   Final    BLOOD RIGHT ARM Performed at [facility], [place] [address], [place], [place] [address]    Special Requests   Final    BOTTLES DRAWN AEROBIC AND ANAEROBIC Blood Culture results may not be optimal due to an excessive volume of blood received in culture bottles Performed at [place] [address], [place], [place] [address]    Culture   Final    NO GROWTH 5 DAYS Performed at [facility], [place] [address] [place], [place] [address]    Report Status 08/16/2018 FINAL  Final  Culture, blood (routine x 2)     Status: None   Collection Time: 08/11/18  7:45 PM  Result Value Ref Range Status   Specimen Description   Final    BLOOD LEFT ANTECUBITAL Performed at [place] [address], [place], [place] [address]    Special Requests   Final    BOTTLES DRAWN AEROBIC AND ANAEROBIC Blood Culture adequate volume Performed at [place] [address], [place], [place] [address]    Culture   Final    NO GROWTH 5 DAYS Performed at [facility], [place] [address], [place], [place] [address]    Report Status 08/16/2018 FINAL  Final  MRSA PCR Screening     Status: None   Collection Time: 08/14/18  1:27 PM  Result Value Ref Range Status   MRSA by PCR NEGATIVE NEGATIVE Final    Comment:        The GeneXpert MRSA Assay (FDA approved for NASAL specimens only), is one component of a comprehensive MRSA colonization surveillance program. It is  not intended to diagnose MRSA infection nor to guide or monitor treatment for MRSA infections. Performed at [facility], [place] [address] [place], [place] [address]       Radiology Studies: No results found. LOS: 7 days   Time spent: 25 minutes.  Patrecia Pour, MD [facility] [email or web address] Password [facility] 08/18/2018, 12:37 PM

## 2018-08-19 DIAGNOSIS — Z515 Encounter for palliative care: Secondary | ICD-10-CM

## 2018-08-19 DIAGNOSIS — J9622 Acute and chronic respiratory failure with hypercapnia: Secondary | ICD-10-CM

## 2018-08-19 DIAGNOSIS — Z7189 Other specified counseling: Secondary | ICD-10-CM

## 2018-08-19 LAB — BLOOD GAS, ARTERIAL
Acid-Base Excess: 14.8 mmol/L — ABNORMAL HIGH (ref 0.0–2.0)
Bicarbonate: 42 mmol/L — ABNORMAL HIGH (ref 20.0–28.0)
Delivery systems: POSITIVE
Drawn by: 308601
Expiratory PAP: 6
FIO2: 25
Inspiratory PAP: 18
Mode: POSITIVE
O2 SAT: 92.8 %
PCO2 ART: 64.1 mmHg — AB (ref 32.0–48.0)
Patient temperature: 98.6
pH, Arterial: 7.432 (ref 7.350–7.450)
pO2, Arterial: 65.2 mmHg — ABNORMAL LOW (ref 83.0–108.0)

## 2018-08-19 LAB — MAGNESIUM: Magnesium: 2.4 mg/dL (ref 1.7–2.4)

## 2018-08-19 LAB — PHOSPHORUS: Phosphorus: 3.2 mg/dL (ref 2.5–4.6)

## 2018-08-19 MED ORDER — MORPHINE SULFATE (PF) 2 MG/ML IV SOLN
1.0000 mg | INTRAVENOUS | Status: DC | PRN
  Administered 2018-08-23 – 2018-08-27 (×4): 1 mg via INTRAVENOUS
  Filled 2018-08-19 (×4): qty 1

## 2018-08-19 NOTE — Progress Notes (Signed)
PROGRESS NOTE    Courtney Ayers  WNI:[id number] DOB: 14-May-1946 DOA: 08/11/2018 PCP: [name], MD    Brief Narrative:  73 y.o. female with a history of chronic hypoxic respiratory failure, COPD, HTN, depression who presented with dyspnea and cough. She was prescribed prednisone and doxycycline after she reported to be too weak to present to pulmonology office visit but had GI side effects limiting this to a single dose. In the ED she was tachypneic, wheezing with fever of 101.34F. CXR without definite infiltrate, viral panel negative, no recent travel to implicate coronavirus. She was given IV steroids, ceftriaxone, and continues to feel severely dyspneic with oxygen requirement up to 5LPM. Her pulmonologist was consulted and recommendations given.  Assessment & Plan:   Principal Problem:   COPD with acute exacerbation ([place]) Active Problems:   Acute on chronic respiratory failure with hypoxia (HCC)   Chronic respiratory failure with hypoxia (HCC)   HTN (hypertension)   Tachypnea  Acute on chronic hypoxic respiratory failure:  -Likely secondary to COPD exacerbation, presumed acute infectious process involved.  - Continue BiPAP support as needed with rest off with nasal cannula. Palliative Care recommended by Pulmonary, consulted - Continued on PRN morphine for comfort - Continue supplemental oxygen, but would not target SpO2 >92%. - Completed empiric ceftriaxone empirically 3/3-3/7 and azithromycin 3/6-3/10.  - Continued on IV steroids. Cont to wean as tolerated - Continue breathing treatments as tolerated  Depression, anxiety:  - Continue SSRI - Continue low dose xanax prn, newly added. -Seems stable at present  HTN: Continue prn hydralazine -BP stable   DVT prophylaxis: Lovenox subQ Code Status: No CPR, no Cardioversion Family Communication: Pt in room, family not at bedside Disposition Plan: Uncertain at this time  Consultants:   PCCM  Palliative Care   Procedures:     Antimicrobials: Anti-infectives (From admission, onward)   Start     Dose/Rate Route Frequency Ordered Stop   08/14/18 1200  azithromycin (ZITHROMAX) 250 mg in dextrose 5 % 125 mL IVPB     250 mg 125 mL/hr over 60 Minutes Intravenous Every 24 hours 08/14/18 1146 08/18/18 1314   08/13/18 2200  cefTRIAXone (ROCEPHIN) 1 g in sodium chloride 0.9 % 100 mL IVPB     1 g 200 mL/hr over 30 Minutes Intravenous Every 24 hours 08/13/18 1743 08/14/18 2138   08/12/18 2200  cefTRIAXone (ROCEPHIN) 1 g in sodium chloride 0.9 % 100 mL IVPB  Status:  Discontinued     1 g 200 mL/hr over 30 Minutes Intravenous Every 24 hours 08/11/18 2243 08/12/18 0833   08/12/18 2200  cefTRIAXone (ROCEPHIN) 1 g in sodium chloride 0.9 % 100 mL IVPB  Status:  Discontinued     1 g 200 mL/hr over 30 Minutes Intravenous Every 24 hours 08/12/18 1507 08/13/18 1743   08/11/18 2045  cefTRIAXone (ROCEPHIN) 1 g in sodium chloride 0.9 % 100 mL IVPB     1 g 200 mL/hr over 30 Minutes Intravenous  Once 08/11/18 2044 08/11/18 2155   08/11/18 2045  azithromycin (ZITHROMAX) tablet 500 mg     500 mg Oral  Once 08/11/18 2044 08/11/18 2153       Subjective: Reports breathing somewhat better this AM  Objective: Vitals:   08/19/18 0900 08/19/18 1000 08/19/18 1100 08/19/18 1200  BP: (!) 166/83 (!) 146/92 (!) 121/56 124/62  Pulse: 95 89 66 65  Resp: 15 (!) 21 (!) 24 (!) 25  Temp:    (!) 97.5 F (36.4 C)  TempSrc:    Axillary  SpO2: 98% 100% 100% 100%  Weight:      Height:        Intake/Output Summary (Last 24 hours) at 08/19/2018 1325 Last data filed at 08/19/2018 1223 Gross per 24 hour  Intake 687.79 ml  Output 900 ml  Net -212.21 ml   Filed Weights   08/11/18 1711 08/14/18 0918  Weight: 62.6 kg 63.6 kg    Examination:  General exam: Appears calm and comfortable  Respiratory system: Mildly increased resp effort, decreased BS Cardiovascular system: S1 & S2 heard, RRR Gastrointestinal system: Abdomen  is nondistended, soft and nontender. No organomegaly or masses felt. Normal bowel sounds heard. Central nervous system: Alert and oriented. No focal neurological deficits. Extremities: Symmetric 5 x 5 power. Skin: No rashes, lesions Psychiatry: Judgement and insight appear normal. Mood & affect appropriate.   Data Reviewed: I have personally reviewed following labs and imaging studies  CBC: Recent Labs  Lab 08/18/18 0658  WBC 15.1*  HGB 13.4  HCT 44.1  MCV 101.6*  PLT 481   Basic Metabolic Panel: Recent Labs  Lab 08/16/18 0330 08/18/18 0658 08/19/18 0313  NA 142 141  --   K 4.0 4.2  --   CL 97* 96*  --   CO2 37* 38*  --   GLUCOSE 125* 120*  --   BUN 26* 23  --   CREATININE 0.51 0.51  --   CALCIUM 8.7* 8.5*  --   MG  --   --  2.4  PHOS  --   --  3.2   GFR: Estimated Creatinine Clearance: 60.9 mL/min (by C-G formula based on SCr of 0.51 mg/dL). Liver Function Tests: No results for input(s): AST, ALT, ALKPHOS, BILITOT, PROT, ALBUMIN in the last 168 hours. No results for input(s): LIPASE, AMYLASE in the last 168 hours. No results for input(s): AMMONIA in the last 168 hours. Coagulation Profile: No results for input(s): INR, PROTIME in the last 168 hours. Cardiac Enzymes: No results for input(s): CKTOTAL, CKMB, CKMBINDEX, TROPONINI in the last 168 hours. BNP (last 3 results) No results for input(s): PROBNP in the last 8760 hours. HbA1C: No results for input(s): HGBA1C in the last 72 hours. CBG: No results for input(s): GLUCAP in the last 168 hours. Lipid Profile: No results for input(s): CHOL, HDL, LDLCALC, TRIG, CHOLHDL, LDLDIRECT in the last 72 hours. Thyroid Function Tests: No results for input(s): TSH, T4TOTAL, FREET4, T3FREE, THYROIDAB in the last 72 hours. Anemia Panel: No results for input(s): VITAMINB12, FOLATE, FERRITIN, TIBC, IRON, RETICCTPCT in the last 72 hours. Sepsis Labs: No results for input(s): PROCALCITON, LATICACIDVEN in the last 168 hours.   Recent Results (from the past 240 hour(s))  Culture, blood (routine x 2)     Status: None   Collection Time: 08/11/18  7:44 PM  Result Value Ref Range Status   Specimen Description   Final    BLOOD RIGHT ARM Performed at [facility] [address], [place], [place] [address]    Special Requests   Final    BOTTLES DRAWN AEROBIC AND ANAEROBIC Blood Culture results may not be optimal due to an excessive volume of blood received in culture bottles Performed at [facility] [address] [place], [place] [address]    Culture   Final    NO GROWTH 5 DAYS Performed at [facility], [place] [address] [place],  [address]    Report Status 08/16/2018 FINAL  Final  Culture, blood (routine x 2)     Status: None   Collection Time: 08/11/18  7:45 PM  Result Value Ref Range Status   Specimen Description   Final    BLOOD LEFT ANTECUBITAL Performed at [facility] [address] [place], [place] [address]    Special Requests   Final    BOTTLES DRAWN AEROBIC AND ANAEROBIC Blood Culture adequate volume Performed at [facility] [address], [place], [place] [address]    Culture   Final    NO GROWTH 5 DAYS Performed at [facility], [place] [address] [place], [place] [address]    Report Status 08/16/2018 FINAL  Final  MRSA PCR Screening     Status: None   Collection Time: 08/14/18  1:27 PM  Result Value Ref Range Status   MRSA by PCR NEGATIVE NEGATIVE Final    Comment:        The GeneXpert MRSA Assay (FDA approved for NASAL specimens only), is one component of a comprehensive MRSA colonization surveillance program. It is not intended to diagnose MRSA infection nor to guide or monitor treatment for MRSA infections. Performed at [facility], [place] [address] [place], [place] [address]   Respiratory Panel by PCR     Status: None   Collection Time: 08/18/18 10:58 AM   Result Value Ref Range Status   Adenovirus NOT DETECTED NOT DETECTED Final   Coronavirus 229E NOT DETECTED NOT DETECTED Final    Comment: (NOTE) The Coronavirus on the Respiratory Panel, DOES NOT test for the novel  Coronavirus (2019 nCoV)    Coronavirus HKU1 NOT DETECTED NOT DETECTED Final   Coronavirus NL63 NOT DETECTED NOT DETECTED Final   Coronavirus OC43 NOT DETECTED NOT DETECTED Final   Metapneumovirus NOT DETECTED NOT DETECTED Final   Rhinovirus / Enterovirus NOT DETECTED NOT DETECTED Final   Influenza A NOT DETECTED NOT DETECTED Final   Influenza B NOT DETECTED NOT DETECTED Final   Parainfluenza Virus 1 NOT DETECTED NOT DETECTED Final   Parainfluenza Virus 2 NOT DETECTED NOT DETECTED Final   Parainfluenza Virus 3 NOT DETECTED NOT DETECTED Final   Parainfluenza Virus 4 NOT DETECTED NOT DETECTED Final   Respiratory Syncytial Virus NOT DETECTED NOT DETECTED Final   Bordetella pertussis NOT DETECTED NOT DETECTED Final   Chlamydophila pneumoniae NOT DETECTED NOT DETECTED Final   Mycoplasma pneumoniae NOT DETECTED NOT DETECTED Final    Comment: Performed at [facility], [address] [place], [place] [address]     Radiology Studies: No results found.  Scheduled Meds: . arformoterol  15 mcg Nebulization BID  . budesonide (PULMICORT) nebulizer solution  0.5 mg Nebulization BID  . calcium-vitamin D  1 tablet Oral Daily  . chlorhexidine  15 mL Mouth Rinse BID  . Chlorhexidine Gluconate Cloth  6 each Topical Daily  . citalopram  10 mg Oral QHS  . enoxaparin (LOVENOX) injection  40 mg Subcutaneous QHS  . guaiFENesin  1,200 mg Oral BID  . ipratropium  2 spray Each Nare QID  . ipratropium-albuterol  3 mL Nebulization Q6H  . mouth rinse  15 mL Mouth Rinse q12n4p  . methylPREDNISolone (SOLU-MEDROL) injection  60 mg Intravenous Q8H  . montelukast  10 mg Oral QHS   Continuous Infusions: . sodium chloride 10 mL/hr at 08/19/18 1223     LOS: 8 days   Marylu Lund, MD  [facility] Pager On Amion  If 7PM-7AM, please contact night-coverage  08/19/2018, 1:25 PM

## 2018-08-19 NOTE — Progress Notes (Signed)
Pt requesting to come off BIPAP at this time.  Pt placed on 5 LPM [place], Pt tolerating well.  RT to monitor and assess as needed.

## 2018-08-20 DIAGNOSIS — R0602 Shortness of breath: Secondary | ICD-10-CM

## 2018-08-20 LAB — PHOSPHORUS: Phosphorus: 2.9 mg/dL (ref 2.5–4.6)

## 2018-08-20 LAB — MAGNESIUM: MAGNESIUM: 2.3 mg/dL (ref 1.7–2.4)

## 2018-08-20 NOTE — Progress Notes (Signed)
Patient placed back on BiPAP per request. RT will continue to monitor patient.

## 2018-08-20 NOTE — Progress Notes (Signed)
PROGRESS NOTE    Courtney Ayers  CVE:[id number] DOB: 04-Jun-1946 DOA: 08/11/2018 PCP: [name], MD    Brief Narrative:  73 y.o. female with a history of chronic hypoxic respiratory failure, COPD, HTN, depression who presented with dyspnea and cough. She was prescribed prednisone and doxycycline after she reported to be too weak to present to pulmonology office visit but had GI side effects limiting this to a single dose. In the ED she was tachypneic, wheezing with fever of 101.34F. CXR without definite infiltrate, viral panel negative, no recent travel to implicate coronavirus. She was given IV steroids, ceftriaxone, and continues to feel severely dyspneic with oxygen requirement up to 5LPM. Her pulmonologist was consulted and recommendations given.  Assessment & Plan:   Principal Problem:   COPD with acute exacerbation ([place]) Active Problems:   Acute on chronic respiratory failure with hypoxia (HCC)   Chronic respiratory failure with hypoxia (HCC)   HTN (hypertension)   Tachypnea  Acute on chronic hypoxic respiratory failure:  -Likely secondary to COPD exacerbation, presumed acute infectious process involved.  - Continue BiPAP support as needed with rest off with nasal cannula. Palliative Care recommended by Pulmonary, consulted - Continued on PRN morphine for comfort - Continue supplemental oxygen, but would not target SpO2 >92%. - Completed empiric ceftriaxone empirically 3/3-3/7 and azithromycin 3/6-3/10.  - Continued on IV steroids. Cont to wean as tolerated - Cont with bronchodilator as tolerated  Depression, anxiety:  - Continue SSRI as tolerated - Continue low dose xanax prn, newly added. -Seems stable at present  HTN: Continue prn hydralazine -BP remains stable at this time  DVT prophylaxis: Lovenox subQ Code Status: No CPR, no Cardioversion Family Communication: Pt in room, family not at bedside Disposition Plan: Uncertain at this time  Consultants:    PCCM  Palliative Care  Procedures:     Antimicrobials: Anti-infectives (From admission, onward)   Start     Dose/Rate Route Frequency Ordered Stop   08/14/18 1200  azithromycin (ZITHROMAX) 250 mg in dextrose 5 % 125 mL IVPB     250 mg 125 mL/hr over 60 Minutes Intravenous Every 24 hours 08/14/18 1146 08/18/18 1314   08/13/18 2200  cefTRIAXone (ROCEPHIN) 1 g in sodium chloride 0.9 % 100 mL IVPB     1 g 200 mL/hr over 30 Minutes Intravenous Every 24 hours 08/13/18 1743 08/14/18 2138   08/12/18 2200  cefTRIAXone (ROCEPHIN) 1 g in sodium chloride 0.9 % 100 mL IVPB  Status:  Discontinued     1 g 200 mL/hr over 30 Minutes Intravenous Every 24 hours 08/11/18 2243 08/12/18 0833   08/12/18 2200  cefTRIAXone (ROCEPHIN) 1 g in sodium chloride 0.9 % 100 mL IVPB  Status:  Discontinued     1 g 200 mL/hr over 30 Minutes Intravenous Every 24 hours 08/12/18 1507 08/13/18 1743   08/11/18 2045  cefTRIAXone (ROCEPHIN) 1 g in sodium chloride 0.9 % 100 mL IVPB     1 g 200 mL/hr over 30 Minutes Intravenous  Once 08/11/18 2044 08/11/18 2155   08/11/18 2045  azithromycin (ZITHROMAX) tablet 500 mg     500 mg Oral  Once 08/11/18 2044 08/11/18 2153      Subjective: States breathing somewhat better this AM  Objective: Vitals:   08/20/18 1300 08/20/18 1400 08/20/18 1406 08/20/18 1412  BP: 121/68 (!) 160/76    Pulse: 83 76 74   Resp: 17 12 20    Temp:      TempSrc:  SpO2: 96% 95% 100% 100%  Weight:      Height:        Intake/Output Summary (Last 24 hours) at 08/20/2018 1530 Last data filed at 08/20/2018 1000 Gross per 24 hour  Intake 672.96 ml  Output 400 ml  Net 272.96 ml   Filed Weights   08/11/18 1711 08/14/18 0918  Weight: 62.6 kg 63.6 kg    Examination: General exam: Awake, laying in bed, in nad Respiratory system: Normal respiratory effort, no wheezing, decreased breath sounds Cardiovascular system: regular rate, s1, s2 Gastrointestinal system: Soft, nondistended, positive BS  Central nervous system: CN2-12 grossly intact, strength intact Extremities: Perfused, no clubbing Skin: Normal skin turgor, no notable skin lesions seen Psychiatry: Mood normal // no visual hallucinations   Data Reviewed: I have personally reviewed following labs and imaging studies  CBC: Recent Labs  Lab 08/18/18 0658  WBC 15.1*  HGB 13.4  HCT 44.1  MCV 101.6*  PLT 858   Basic Metabolic Panel: Recent Labs  Lab 08/16/18 0330 08/18/18 0658 08/19/18 0313 08/20/18 0241  NA 142 141  --   --   K 4.0 4.2  --   --   CL 97* 96*  --   --   CO2 37* 38*  --   --   GLUCOSE 125* 120*  --   --   BUN 26* 23  --   --   CREATININE 0.51 0.51  --   --   CALCIUM 8.7* 8.5*  --   --   MG  --   --  2.4 2.3  PHOS  --   --  3.2 2.9   GFR: Estimated Creatinine Clearance: 60.9 mL/min (by C-G formula based on SCr of 0.51 mg/dL). Liver Function Tests: No results for input(s): AST, ALT, ALKPHOS, BILITOT, PROT, ALBUMIN in the last 168 hours. No results for input(s): LIPASE, AMYLASE in the last 168 hours. No results for input(s): AMMONIA in the last 168 hours. Coagulation Profile: No results for input(s): INR, PROTIME in the last 168 hours. Cardiac Enzymes: No results for input(s): CKTOTAL, CKMB, CKMBINDEX, TROPONINI in the last 168 hours. BNP (last 3 results) No results for input(s): PROBNP in the last 8760 hours. HbA1C: No results for input(s): HGBA1C in the last 72 hours. CBG: No results for input(s): GLUCAP in the last 168 hours. Lipid Profile: No results for input(s): CHOL, HDL, LDLCALC, TRIG, CHOLHDL, LDLDIRECT in the last 72 hours. Thyroid Function Tests: No results for input(s): TSH, T4TOTAL, FREET4, T3FREE, THYROIDAB in the last 72 hours. Anemia Panel: No results for input(s): VITAMINB12, FOLATE, FERRITIN, TIBC, IRON, RETICCTPCT in the last 72 hours. Sepsis Labs: No results for input(s): PROCALCITON, LATICACIDVEN in the last 168 hours.  Recent Results (from the past 240 hour(s))   Culture, blood (routine x 2)     Status: None   Collection Time: 08/11/18  7:44 PM  Result Value Ref Range Status   Specimen Description   Final    BLOOD RIGHT ARM Performed at [facility] [address], [place], [place] [address]    Special Requests   Final    BOTTLES DRAWN AEROBIC AND ANAEROBIC Blood Culture results may not be optimal due to an excessive volume of blood received in culture bottles Performed at [facility] [address], [place], [place] [address]    Culture   Final    NO GROWTH 5 DAYS Performed at [facility], [place] [address] [place], [place]  [address]    Report Status 08/16/2018 FINAL  Final  Culture, blood (routine x 2)     Status: None   Collection Time: 08/11/18  7:45 PM  Result Value Ref Range Status   Specimen Description   Final    BLOOD LEFT ANTECUBITAL Performed at [facility] [address] [place], [place] [address]    Special Requests   Final    BOTTLES DRAWN AEROBIC AND ANAEROBIC Blood Culture adequate volume Performed at [facility] [address], [place], [place] [address]    Culture   Final    NO GROWTH 5 DAYS Performed at [facility], [place] [address] [place], [place] [address]    Report Status 08/16/2018 FINAL  Final  MRSA PCR Screening     Status: None   Collection Time: 08/14/18  1:27 PM  Result Value Ref Range Status   MRSA by PCR NEGATIVE NEGATIVE Final    Comment:        The GeneXpert MRSA Assay (FDA approved for NASAL specimens only), is one component of a comprehensive MRSA colonization surveillance program. It is not intended to diagnose MRSA infection nor to guide or monitor treatment for MRSA infections. Performed at [facility], [place] [address] [place], [place] [address]   Respiratory Panel by PCR     Status: None   Collection Time: 08/18/18 10:58 AM  Result Value Ref Range Status   Adenovirus NOT  DETECTED NOT DETECTED Final   Coronavirus 229E NOT DETECTED NOT DETECTED Final    Comment: (NOTE) The Coronavirus on the Respiratory Panel, DOES NOT test for the novel  Coronavirus (2019 nCoV)    Coronavirus HKU1 NOT DETECTED NOT DETECTED Final   Coronavirus NL63 NOT DETECTED NOT DETECTED Final   Coronavirus OC43 NOT DETECTED NOT DETECTED Final   Metapneumovirus NOT DETECTED NOT DETECTED Final   Rhinovirus / Enterovirus NOT DETECTED NOT DETECTED Final   Influenza A NOT DETECTED NOT DETECTED Final   Influenza B NOT DETECTED NOT DETECTED Final   Parainfluenza Virus 1 NOT DETECTED NOT DETECTED Final   Parainfluenza Virus 2 NOT DETECTED NOT DETECTED Final   Parainfluenza Virus 3 NOT DETECTED NOT DETECTED Final   Parainfluenza Virus 4 NOT DETECTED NOT DETECTED Final   Respiratory Syncytial Virus NOT DETECTED NOT DETECTED Final   Bordetella pertussis NOT DETECTED NOT DETECTED Final   Chlamydophila pneumoniae NOT DETECTED NOT DETECTED Final   Mycoplasma pneumoniae NOT DETECTED NOT DETECTED Final    Comment: Performed at [facility], [address] [place], [place] [address]     Radiology Studies: No results found.  Scheduled Meds: . arformoterol  15 mcg Nebulization BID  . budesonide (PULMICORT) nebulizer solution  0.5 mg Nebulization BID  . calcium-vitamin D  1 tablet Oral Daily  . chlorhexidine  15 mL Mouth Rinse BID  . Chlorhexidine Gluconate Cloth  6 each Topical Daily  . citalopram  10 mg Oral QHS  . enoxaparin (LOVENOX) injection  40 mg Subcutaneous QHS  . guaiFENesin  1,200 mg Oral BID  . ipratropium  2 spray Each Nare QID  . ipratropium-albuterol  3 mL Nebulization Q6H  . mouth rinse  15 mL Mouth Rinse q12n4p  . methylPREDNISolone (SOLU-MEDROL) injection  60 mg Intravenous Q8H  . montelukast  10 mg Oral QHS   Continuous Infusions: . sodium chloride 10 mL/hr at 08/19/18 1800     LOS: 9 days   Marylu Lund, MD [facility] Pager On Amion  If 7PM-7AM,  please contact night-coverage 08/20/2018, 3:30 PM

## 2018-08-20 NOTE — Progress Notes (Signed)
NAME:  Courtney Ayers, MRN:  [id number], DOB:  Nov 09, 1945, LOS: 9 ADMISSION DATE:  08/11/2018, CONSULTATION DATE: August 12, 2018 REFERRING MD: Dr. Bonner Puna, CHIEF COMPLAINT: Dyspnea  Brief History   73 year old female who sees Dr. Lake Bells in clinic for advanced COPD - fev 31% in 2016 with dlco 25%, 3 L oxygen baseline presented to [facility] long hospital with several days of chest congestion mucus production shortness of breath.  Has been treated for a COPD exacerbation.  Pulmonary consulted for ongoing dyspnea.  Past Medical History  COPD Chronic respiratory failure with hypoxemia Depression  Significant Hospital Events   3/03  Admit  3/04  PCCM consulted  3/06  Tx to SDU for WOB/SOB 3/07  Off BiPAP for 2.5 hours   3/9 - Pt reports limited reserve for talking / physical activity.  Has gone to the bathroom x2.    Consults:  Pulmonary  Procedures:    Significant Diagnostic Tests:    Micro Data:  3/3 flu swab >> negative  Antimicrobials:  Ceftriaxone 3/3 >> 3/6 Azithro 3/6 >> 3/11  Interim history/subjective:    08/20/2018 currently off BiPAP in no acute distress  Objective   Blood pressure (!) 133/54, pulse 89, temperature 98.2 F (36.8 C), temperature source Oral, resp. rate 17, height 5\' 7"  (1.702 m), weight 63.6 kg, last menstrual period 06/10/1992, SpO2 100 %.    FiO2 (%):  [25 %] 25 %   Intake/Output Summary (Last 24 hours) at 08/20/2018 1129 Last data filed at 08/20/2018 1000 Gross per 24 hour  Intake 974.79 ml  Output 400 ml  Net 574.79 ml   Filed Weights   08/11/18 1711 08/14/18 0918  Weight: 62.6 kg 63.6 kg    Examination: General: Frail cachectic female who is currently off BiPAP is obviously short of breath with any activity HEENT: No JVD or lymphadenopathy is appreciated Neuro: Intact  CV: Sounds are regular PULM: even/non-labored, lungs bilaterally air movement throughout IH:KVQQ, non-tender, bsx4 active  Extremities: warm/dry, negative edema   Skin: no rashes or lesions       Resolved Hospital Problem list     Assessment & Plan:   73 year old female admitted for acute on chronic respiratory failure with hypoxemia due to a COPD exacerbation.  Appears to be due to infectious etiology, no recent travel.  Acute Exacerbation of COPD Acute on chronic hypoxemic hypercarbic respiratory failure requiring NIPPV - patient is a DNI  08/20/2018 currently off BiPAP  P: Continue current bronchodilators Off antimicrobial therapy Steroids wean as tolerated  BiPAP nightly and PRN Respiratory virus panel and flu negative If she worsens she should be transitioned to palliative care and comfort, currently is off BiPAP and stable   Pulmonary critical care will be available as needed  Best practice:  Per [facility]   Labs    CBC: Recent Labs  Lab 08/18/18 0658  WBC 15.1*  HGB 13.4  HCT 44.1  MCV 101.6*  PLT 595    Basic Metabolic Panel: Recent Labs  Lab 08/16/18 0330 08/18/18 0658 08/19/18 0313 08/20/18 0241  NA 142 141  --   --   K 4.0 4.2  --   --   CL 97* 96*  --   --   CO2 37* 38*  --   --   GLUCOSE 125* 120*  --   --   BUN 26* 23  --   --   CREATININE 0.51 0.51  --   --   CALCIUM 8.7* 8.5*  --   --  MG  --   --  2.4 2.3  PHOS  --   --  3.2 2.9   GFR: Estimated Creatinine Clearance: 60.9 mL/min (by C-G formula based on SCr of 0.51 mg/dL). Recent Labs  Lab 08/18/18 0658  WBC 15.1*    Liver Function Tests: No results for input(s): AST, ALT, ALKPHOS, BILITOT, PROT, ALBUMIN in the last 168 hours. No results for input(s): LIPASE, AMYLASE in the last 168 hours. No results for input(s): AMMONIA in the last 168 hours.  ABG    Component Value Date/Time   PHART 7.432 08/19/2018 0401   PCO2ART 64.1 (H) 08/19/2018 0401   PO2ART 65.2 (L) 08/19/2018 0401   HCO3 42.0 (H) 08/19/2018 0401   TCO2 37.3 08/07/2014 2335   O2SAT 92.8 08/19/2018 0401     Coagulation Profile: No results for input(s): INR, PROTIME  in the last 168 hours.  Cardiac Enzymes: No results for input(s): CKTOTAL, CKMB, CKMBINDEX, TROPONINI in the last 168 hours.  HbA1C: Hgb A1c MFr Bld  Date/Time Value Ref Range Status  07/01/2016 02:04 PM 5.1 <5.7 % Final    Comment:      For the purpose of screening for the presence of diabetes:   <5.7%       Consistent with the absence of diabetes 5.7-6.4 %   Consistent with increased risk for diabetes (prediabetes) >=6.5 %     Consistent with diabetes   This assay result is consistent with a decreased risk of diabetes.   Currently, no consensus exists regarding use of hemoglobin A1c for diagnosis of diabetes in children.   According to American Diabetes Association (ADA) guidelines, hemoglobin A1c <7.0% represents optimal control in non-pregnant diabetic patients. Different metrics may apply to specific patient populations. Standards of Medical Care in Diabetes (ADA).     06/04/2016 08:26 PM 5.2 4.8 - 5.6 % Final    Comment:    (NOTE)         Pre-diabetes: 5.7 - 6.4         Diabetes: >6.4         Glycemic control for adults with diabetes: <7.0     CBG: No results for input(s): GLUCAP in the last 168 hours.     Adonis Housekeeper Osmel Dykstra ACNP Maryanna Shape PCCM Pager [phone] till 1 pm If no answer page [phone] 08/20/2018, 11:29 AM

## 2018-08-21 LAB — PHOSPHORUS: PHOSPHORUS: 3.4 mg/dL (ref 2.5–4.6)

## 2018-08-21 LAB — MAGNESIUM: Magnesium: 2.3 mg/dL (ref 1.7–2.4)

## 2018-08-21 NOTE — Progress Notes (Signed)
PROGRESS NOTE    Courtney Ayers  PPJ:[id number] DOB: 11/21/1945 DOA: 08/11/2018 PCP: [name], MD    Brief Narrative:  73 y.o. female with a history of chronic hypoxic respiratory failure, COPD, HTN, depression who presented with dyspnea and cough. She was prescribed prednisone and doxycycline after she reported to be too weak to present to pulmonology office visit but had GI side effects limiting this to a single dose. In the ED she was tachypneic, wheezing with fever of 101.28F. CXR without definite infiltrate, viral panel negative, no recent travel to implicate coronavirus. She was given IV steroids, ceftriaxone, and continues to feel severely dyspneic with oxygen requirement up to 5LPM. Her pulmonologist was consulted and recommendations given.  Assessment & Plan:   Principal Problem:   COPD with acute exacerbation ([place]) Active Problems:   Acute on chronic respiratory failure with hypoxia (HCC)   Chronic respiratory failure with hypoxia (HCC)   HTN (hypertension)   Tachypnea  Acute on chronic hypoxic respiratory failure:  -Likely secondary to COPD exacerbation, presumed acute infectious process involved.  - Continue BiPAP support as needed with rest off with nasal cannula. Palliative Care recommended by Pulmonary, consulted - Continued on PRN morphine for comfort - Continue supplemental oxygen, but would not target SpO2 >92%. - Completed empiric ceftriaxone empirically 3/3-3/7 and azithromycin 3/6-3/10.  - Continued on IV steroids. Cont to wean as tolerated - cont bronchodilators as tolerated - Stable on 4LNC with bipap at night.  Depression, anxiety:  - Continue SSRI as tolerated - Continue low dose xanax prn, newly added. - Currently stable  HTN: Continue prn hydralazine -BP thus far stable  DVT prophylaxis: Lovenox subQ Code Status: No CPR, no Cardioversion Family Communication: Pt in room, family not at bedside Disposition Plan: Uncertain at this time   Consultants:   PCCM  Palliative Care  Procedures:     Antimicrobials: Anti-infectives (From admission, onward)   Start     Dose/Rate Route Frequency Ordered Stop   08/14/18 1200  azithromycin (ZITHROMAX) 250 mg in dextrose 5 % 125 mL IVPB     250 mg 125 mL/hr over 60 Minutes Intravenous Every 24 hours 08/14/18 1146 08/18/18 1314   08/13/18 2200  cefTRIAXone (ROCEPHIN) 1 g in sodium chloride 0.9 % 100 mL IVPB     1 g 200 mL/hr over 30 Minutes Intravenous Every 24 hours 08/13/18 1743 08/14/18 2138   08/12/18 2200  cefTRIAXone (ROCEPHIN) 1 g in sodium chloride 0.9 % 100 mL IVPB  Status:  Discontinued     1 g 200 mL/hr over 30 Minutes Intravenous Every 24 hours 08/11/18 2243 08/12/18 0833   08/12/18 2200  cefTRIAXone (ROCEPHIN) 1 g in sodium chloride 0.9 % 100 mL IVPB  Status:  Discontinued     1 g 200 mL/hr over 30 Minutes Intravenous Every 24 hours 08/12/18 1507 08/13/18 1743   08/11/18 2045  cefTRIAXone (ROCEPHIN) 1 g in sodium chloride 0.9 % 100 mL IVPB     1 g 200 mL/hr over 30 Minutes Intravenous  Once 08/11/18 2044 08/11/18 2155   08/11/18 2045  azithromycin (ZITHROMAX) tablet 500 mg     500 mg Oral  Once 08/11/18 2044 08/11/18 2153      Subjective: Reports breathing well on 4LNC with bipap at night  Objective: Vitals:   08/21/18 0900 08/21/18 1000 08/21/18 1200 08/21/18 1419  BP: (!) 162/79 135/65 134/85   Pulse: 66 85 74   Resp: (!) 21 19 (!) 22   Temp:  TempSrc:      SpO2: 100% 99% 95% 95%  Weight:      Height:        Intake/Output Summary (Last 24 hours) at 08/21/2018 1447 Last data filed at 08/21/2018 1127 Gross per 24 hour  Intake 189.9 ml  Output 950 ml  Net -760.1 ml   Filed Weights   08/11/18 1711 08/14/18 0918  Weight: 62.6 kg 63.6 kg    Examination: General exam: Conversant, in no acute distress Respiratory system: normal chest rise, clear, decreased breath sounds Cardiovascular system: regular rhythm, s1-s2 Gastrointestinal system:  Nondistended, nontender, pos BS Central nervous system: No seizures, no tremors Extremities: No cyanosis, no joint deformities Skin: No rashes, no pallor Psychiatry: Affect normal // no auditory hallucinations   Data Reviewed: I have personally reviewed following labs and imaging studies  CBC: Recent Labs  Lab 08/18/18 0658  WBC 15.1*  HGB 13.4  HCT 44.1  MCV 101.6*  PLT 347   Basic Metabolic Panel: Recent Labs  Lab 08/16/18 0330 08/18/18 0658 08/19/18 0313 08/20/18 0241 08/21/18 0251  NA 142 141  --   --   --   K 4.0 4.2  --   --   --   CL 97* 96*  --   --   --   CO2 37* 38*  --   --   --   GLUCOSE 125* 120*  --   --   --   BUN 26* 23  --   --   --   CREATININE 0.51 0.51  --   --   --   CALCIUM 8.7* 8.5*  --   --   --   MG  --   --  2.4 2.3 2.3  PHOS  --   --  3.2 2.9 3.4   GFR: Estimated Creatinine Clearance: 60.9 mL/min (by C-G formula based on SCr of 0.51 mg/dL). Liver Function Tests: No results for input(s): AST, ALT, ALKPHOS, BILITOT, PROT, ALBUMIN in the last 168 hours. No results for input(s): LIPASE, AMYLASE in the last 168 hours. No results for input(s): AMMONIA in the last 168 hours. Coagulation Profile: No results for input(s): INR, PROTIME in the last 168 hours. Cardiac Enzymes: No results for input(s): CKTOTAL, CKMB, CKMBINDEX, TROPONINI in the last 168 hours. BNP (last 3 results) No results for input(s): PROBNP in the last 8760 hours. HbA1C: No results for input(s): HGBA1C in the last 72 hours. CBG: No results for input(s): GLUCAP in the last 168 hours. Lipid Profile: No results for input(s): CHOL, HDL, LDLCALC, TRIG, CHOLHDL, LDLDIRECT in the last 72 hours. Thyroid Function Tests: No results for input(s): TSH, T4TOTAL, FREET4, T3FREE, THYROIDAB in the last 72 hours. Anemia Panel: No results for input(s): VITAMINB12, FOLATE, FERRITIN, TIBC, IRON, RETICCTPCT in the last 72 hours. Sepsis Labs: No results for input(s): PROCALCITON, LATICACIDVEN  in the last 168 hours.  Recent Results (from the past 240 hour(s))  Culture, blood (routine x 2)     Status: None   Collection Time: 08/11/18  7:44 PM  Result Value Ref Range Status   Specimen Description   Final    BLOOD RIGHT ARM Performed at [facility] [address] [place], [place] [address]    Special Requests   Final    BOTTLES DRAWN AEROBIC AND ANAEROBIC Blood Culture results may not be optimal due to an excessive volume of blood received in culture bottles Performed at [facility] [address] [place],  [place] [address]    Culture   Final    NO GROWTH 5 DAYS Performed at [facility], [place] [address] [place], [place] [address]    Report Status 08/16/2018 FINAL  Final  Culture, blood (routine x 2)     Status: None   Collection Time: 08/11/18  7:45 PM  Result Value Ref Range Status   Specimen Description   Final    BLOOD LEFT ANTECUBITAL Performed at [facility] [address], [place], [place] [address]    Special Requests   Final    BOTTLES DRAWN AEROBIC AND ANAEROBIC Blood Culture adequate volume Performed at [facility] [address] [place], [place] [address]    Culture   Final    NO GROWTH 5 DAYS Performed at [facility], [place] [address] [place], [place] [address]    Report Status 08/16/2018 FINAL  Final  MRSA PCR Screening     Status: None   Collection Time: 08/14/18  1:27 PM  Result Value Ref Range Status   MRSA by PCR NEGATIVE NEGATIVE Final    Comment:        The GeneXpert MRSA Assay (FDA approved for NASAL specimens only), is one component of a comprehensive MRSA colonization surveillance program. It is not intended to diagnose MRSA infection nor to guide or monitor treatment for MRSA infections. Performed at [facility], [place] [address] [place], [place] [address]   Respiratory Panel by PCR     Status: None   Collection Time:  08/18/18 10:58 AM  Result Value Ref Range Status   Adenovirus NOT DETECTED NOT DETECTED Final   Coronavirus 229E NOT DETECTED NOT DETECTED Final    Comment: (NOTE) The Coronavirus on the Respiratory Panel, DOES NOT test for the novel  Coronavirus (2019 nCoV)    Coronavirus HKU1 NOT DETECTED NOT DETECTED Final   Coronavirus NL63 NOT DETECTED NOT DETECTED Final   Coronavirus OC43 NOT DETECTED NOT DETECTED Final   Metapneumovirus NOT DETECTED NOT DETECTED Final   Rhinovirus / Enterovirus NOT DETECTED NOT DETECTED Final   Influenza A NOT DETECTED NOT DETECTED Final   Influenza B NOT DETECTED NOT DETECTED Final   Parainfluenza Virus 1 NOT DETECTED NOT DETECTED Final   Parainfluenza Virus 2 NOT DETECTED NOT DETECTED Final   Parainfluenza Virus 3 NOT DETECTED NOT DETECTED Final   Parainfluenza Virus 4 NOT DETECTED NOT DETECTED Final   Respiratory Syncytial Virus NOT DETECTED NOT DETECTED Final   Bordetella pertussis NOT DETECTED NOT DETECTED Final   Chlamydophila pneumoniae NOT DETECTED NOT DETECTED Final   Mycoplasma pneumoniae NOT DETECTED NOT DETECTED Final    Comment: Performed at [facility], [address], [place], [place] [address]     Radiology Studies: No results found.  Scheduled Meds: . arformoterol  15 mcg Nebulization BID  . budesonide (PULMICORT) nebulizer solution  0.5 mg Nebulization BID  . calcium-vitamin D  1 tablet Oral Daily  . chlorhexidine  15 mL Mouth Rinse BID  . Chlorhexidine Gluconate Cloth  6 each Topical Daily  . citalopram  10 mg Oral QHS  . enoxaparin (LOVENOX) injection  40 mg Subcutaneous QHS  . guaiFENesin  1,200 mg Oral BID  . ipratropium  2 spray Each Nare QID  . ipratropium-albuterol  3 mL Nebulization Q6H  . mouth rinse  15 mL Mouth Rinse q12n4p  . methylPREDNISolone (SOLU-MEDROL) injection  60 mg Intravenous Q8H  . montelukast  10  mg Oral QHS   Continuous Infusions: . sodium chloride 10 mL/hr at 08/21/18 0500     LOS: 10 days    Marylu Lund, MD [facility] Pager On Amion  If 7PM-7AM, please contact night-coverage 08/21/2018, 2:47 PM

## 2018-08-21 NOTE — Consult Note (Signed)
Palliative Care Consult Note  Reason for consult: Goals of care and symptom management  Palliative consult received.  Chart reviewed and discussed with hospitalist as well as bedside RN.  I saw and examined Courtney Ayers.  She is a 73 year old female with chronic hypoxic respiratory failure, COPD, HTN. Depression admitted with COPD exacerbation.  She has been reliant on Bipap but this has been able to be weaned more today.  Discussed with her her clinical course.  I am not sure how much she is understanding at this point with ? Confusion.  - Addition of morphine as needed - Follow clinical course and continue goals conversation based on her clinical course over the next 24-48 hours.  Total time 45 minutes Greater than 50%  of this time was spent counseling and coordinating care related to the above assessment and plan.  Micheline Rough, MD [place] Team [phone]

## 2018-08-21 NOTE — Progress Notes (Signed)
Palliative Care Progress Note  Reason for consult: Goals of care in light of advanced COPD  I met today with Courtney Ayers and her sister.  Courtney Ayers has difficulty speaking and her sister answers questions and she indicates yes or no.    Her sister reports that she most important thing to her are her family and returning to her own home.  Her sister is legal next of kin.  She has two "adopted" daughters but they are not legally adopted.   We discussed clinical course as well as wishes moving forward in regard to advanced directives.  Concepts specific to code status, HCPOA, and rehospitalization discussed.  We discussed difference between a aggressive medical intervention path and a palliative, comfort focused care path.  We reviewed a MOST form.  - Courtney Ayers is not interested in hospice care at this time.  Her goal is to continue current interventions with hope that she will continue to improve followed by follow-up with Dr. Lake Bells to further discuss long term goals. - Recommended completion of HCPOA as well as MOST form.  We reviewed these today.  Chaplain consult placed.  Family to call if they would like to complete MOST form prior to discharge.  Questions and concerns addressed.   PMT will continue to support holistically.  Total time: 50 minutes Greater than 50%  of this time was spent counseling and coordinating care related to the above assessment and plan.  Micheline Rough, MD [place] Team [phone]

## 2018-08-21 NOTE — Progress Notes (Signed)
Nutrition Brief Note  Patient identified for LOS (day #10).  Wt Readings from Last 15 Encounters:  08/14/18 63.6 kg  02/23/18 62.8 kg  01/28/18 62.1 kg  05/07/17 59 kg  12/30/16 60.2 kg  11/12/16 61.7 kg  09/17/16 62.1 kg  07/11/16 69.4 kg  07/01/16 63.5 kg  06/21/16 64.4 kg  06/05/16 64.7 kg  05/15/16 63 kg  03/21/16 62.6 kg  01/23/16 61.7 kg  11/23/15 61.7 kg    Body mass index is 21.96 kg/m. Patient meets criteria for normal weight based on current BMI. Skin WDL. Patient acute COPD exacerbation which is thought to be 2/2 infectious process. Pulmonary had recommended Palliative Care see patient; hospitalist placed consult for this and Dr. Domingo Cocking saw patient on 3/11 and stated plan to continue to follow clinical course and have ongoing conversations concerning [place] as clinical course allows.  Current diet order is Regular and she has been eating mainly 75-100% of meals for the past week. Labs and medications reviewed.   No nutrition interventions warranted at this time. If nutrition issues arise, please consult RD.     Jarome Matin, MS, RD, LDN, [facility] Clinical Dietitian Pager # [phone] After hours/weekend pager # [phone]

## 2018-08-22 LAB — MAGNESIUM: Magnesium: 2.2 mg/dL (ref 1.7–2.4)

## 2018-08-22 LAB — PHOSPHORUS: Phosphorus: 3.2 mg/dL (ref 2.5–4.6)

## 2018-08-22 MED ORDER — LIP MEDEX EX OINT
TOPICAL_OINTMENT | CUTANEOUS | Status: DC | PRN
  Filled 2018-08-22: qty 7

## 2018-08-22 NOTE — Progress Notes (Signed)
PROGRESS NOTE    Courtney Ayers  RCV:[id number] DOB: 1946-01-28 DOA: 08/11/2018 PCP: [name], MD    Brief Narrative:  73 y.o. female with a history of chronic hypoxic respiratory failure, COPD, HTN, depression who presented with dyspnea and cough. She was prescribed prednisone and doxycycline after she reported to be too weak to present to pulmonology office visit but had GI side effects limiting this to a single dose. In the ED she was tachypneic, wheezing with fever of 101.67F. CXR without definite infiltrate, viral panel negative, no recent travel to implicate coronavirus. She was given IV steroids, ceftriaxone, and continues to feel severely dyspneic with oxygen requirement up to 5LPM. Her pulmonologist was consulted and recommendations given.  Assessment & Plan:   Principal Problem:   COPD with acute exacerbation ([place]) Active Problems:   Acute on chronic respiratory failure with hypoxia (HCC)   Chronic respiratory failure with hypoxia (HCC)   HTN (hypertension)   Tachypnea  Acute on chronic hypoxic respiratory failure:  -Likely secondary to COPD exacerbation, presumed acute infectious process involved.  - Continue BiPAP support as needed with rest off with nasal cannula. Palliative Care recommended by Pulmonary, consulted - Continued on PRN morphine for comfort - Continue supplemental oxygen, but would not target SpO2 >92%. - Completed empiric ceftriaxone empirically 3/3-3/7 and azithromycin 3/6-3/10.  - Continued on IV steroids. Cont to wean as tolerated - cont bronchodilators as tolerated - Currently on 3LNC with O2 sat of 100%. Cont to wean as tolerated. Continued on bipap at night  Depression, anxiety:  - Continue SSRI as tolerated - Continue low dose xanax prn, newly added. - presently stable  HTN: Continue prn hydralazine -BP currently stable  DVT prophylaxis: Lovenox subQ Code Status: No CPR, no Cardioversion Family Communication: Pt in room, family not  at bedside Disposition Plan: Uncertain at this time  Consultants:   PCCM  Palliative Care  Procedures:     Antimicrobials: Anti-infectives (From admission, onward)   Start     Dose/Rate Route Frequency Ordered Stop   08/14/18 1200  azithromycin (ZITHROMAX) 250 mg in dextrose 5 % 125 mL IVPB     250 mg 125 mL/hr over 60 Minutes Intravenous Every 24 hours 08/14/18 1146 08/18/18 1314   08/13/18 2200  cefTRIAXone (ROCEPHIN) 1 g in sodium chloride 0.9 % 100 mL IVPB     1 g 200 mL/hr over 30 Minutes Intravenous Every 24 hours 08/13/18 1743 08/14/18 2138   08/12/18 2200  cefTRIAXone (ROCEPHIN) 1 g in sodium chloride 0.9 % 100 mL IVPB  Status:  Discontinued     1 g 200 mL/hr over 30 Minutes Intravenous Every 24 hours 08/11/18 2243 08/12/18 0833   08/12/18 2200  cefTRIAXone (ROCEPHIN) 1 g in sodium chloride 0.9 % 100 mL IVPB  Status:  Discontinued     1 g 200 mL/hr over 30 Minutes Intravenous Every 24 hours 08/12/18 1507 08/13/18 1743   08/11/18 2045  cefTRIAXone (ROCEPHIN) 1 g in sodium chloride 0.9 % 100 mL IVPB     1 g 200 mL/hr over 30 Minutes Intravenous  Once 08/11/18 2044 08/11/18 2155   08/11/18 2045  azithromycin (ZITHROMAX) tablet 500 mg     500 mg Oral  Once 08/11/18 2044 08/11/18 2153      Subjective: Reports breathing better today  Objective: Vitals:   08/22/18 0829 08/22/18 0832 08/22/18 1200 08/22/18 1444  BP:   (!) 176/77   Pulse:   84   Resp:   12  Temp:   98.4 F (36.9 C)   TempSrc:   Oral   SpO2: 100% 100% 99% 100%  Weight:      Height:        Intake/Output Summary (Last 24 hours) at 08/22/2018 1514 Last data filed at 08/22/2018 1303 Gross per 24 hour  Intake 220.45 ml  Output 1900 ml  Net -1679.55 ml   Filed Weights   08/11/18 1711 08/14/18 0918  Weight: 62.6 kg 63.6 kg    Examination: General exam: Conversant, in no acute distress Respiratory system: normal chest rise, clear, no audible wheezing Cardiovascular system: regular rhythm,  s1-s2 Gastrointestinal system: Nondistended, nontender, pos BS Central nervous system: No seizures, no tremors Extremities: No cyanosis, no joint deformities Skin: No rashes, no pallor Psychiatry: Affect normal // no auditory hallucinations   Data Reviewed: I have personally reviewed following labs and imaging studies  CBC: Recent Labs  Lab 08/18/18 0658  WBC 15.1*  HGB 13.4  HCT 44.1  MCV 101.6*  PLT 540   Basic Metabolic Panel: Recent Labs  Lab 08/16/18 0330 08/18/18 0658 08/19/18 0313 08/20/18 0241 08/21/18 0251 08/22/18 0257  NA 142 141  --   --   --   --   K 4.0 4.2  --   --   --   --   CL 97* 96*  --   --   --   --   CO2 37* 38*  --   --   --   --   GLUCOSE 125* 120*  --   --   --   --   BUN 26* 23  --   --   --   --   CREATININE 0.51 0.51  --   --   --   --   CALCIUM 8.7* 8.5*  --   --   --   --   MG  --   --  2.4 2.3 2.3 2.2  PHOS  --   --  3.2 2.9 3.4 3.2   GFR: Estimated Creatinine Clearance: 60.9 mL/min (by C-G formula based on SCr of 0.51 mg/dL). Liver Function Tests: No results for input(s): AST, ALT, ALKPHOS, BILITOT, PROT, ALBUMIN in the last 168 hours. No results for input(s): LIPASE, AMYLASE in the last 168 hours. No results for input(s): AMMONIA in the last 168 hours. Coagulation Profile: No results for input(s): INR, PROTIME in the last 168 hours. Cardiac Enzymes: No results for input(s): CKTOTAL, CKMB, CKMBINDEX, TROPONINI in the last 168 hours. BNP (last 3 results) No results for input(s): PROBNP in the last 8760 hours. HbA1C: No results for input(s): HGBA1C in the last 72 hours. CBG: No results for input(s): GLUCAP in the last 168 hours. Lipid Profile: No results for input(s): CHOL, HDL, LDLCALC, TRIG, CHOLHDL, LDLDIRECT in the last 72 hours. Thyroid Function Tests: No results for input(s): TSH, T4TOTAL, FREET4, T3FREE, THYROIDAB in the last 72 hours. Anemia Panel: No results for input(s): VITAMINB12, FOLATE, FERRITIN, TIBC, IRON,  RETICCTPCT in the last 72 hours. Sepsis Labs: No results for input(s): PROCALCITON, LATICACIDVEN in the last 168 hours.  Recent Results (from the past 240 hour(s))  MRSA PCR Screening     Status: None   Collection Time: 08/14/18  1:27 PM  Result Value Ref Range Status   MRSA by PCR NEGATIVE NEGATIVE Final    Comment:        The GeneXpert MRSA Assay (FDA approved for NASAL specimens only), is one component of a comprehensive MRSA  colonization surveillance program. It is not intended to diagnose MRSA infection nor to guide or monitor treatment for MRSA infections. Performed at [facility], [place] [address] [place], [place] [address]   Respiratory Panel by PCR     Status: None   Collection Time: 08/18/18 10:58 AM  Result Value Ref Range Status   Adenovirus NOT DETECTED NOT DETECTED Final   Coronavirus 229E NOT DETECTED NOT DETECTED Final    Comment: (NOTE) The Coronavirus on the Respiratory Panel, DOES NOT test for the novel  Coronavirus (2019 nCoV)    Coronavirus HKU1 NOT DETECTED NOT DETECTED Final   Coronavirus NL63 NOT DETECTED NOT DETECTED Final   Coronavirus OC43 NOT DETECTED NOT DETECTED Final   Metapneumovirus NOT DETECTED NOT DETECTED Final   Rhinovirus / Enterovirus NOT DETECTED NOT DETECTED Final   Influenza A NOT DETECTED NOT DETECTED Final   Influenza B NOT DETECTED NOT DETECTED Final   Parainfluenza Virus 1 NOT DETECTED NOT DETECTED Final   Parainfluenza Virus 2 NOT DETECTED NOT DETECTED Final   Parainfluenza Virus 3 NOT DETECTED NOT DETECTED Final   Parainfluenza Virus 4 NOT DETECTED NOT DETECTED Final   Respiratory Syncytial Virus NOT DETECTED NOT DETECTED Final   Bordetella pertussis NOT DETECTED NOT DETECTED Final   Chlamydophila pneumoniae NOT DETECTED NOT DETECTED Final   Mycoplasma pneumoniae NOT DETECTED NOT DETECTED Final    Comment: Performed at [facility], [address] [place], [place] [address]     Radiology Studies:  No results found.  Scheduled Meds: . arformoterol  15 mcg Nebulization BID  . budesonide (PULMICORT) nebulizer solution  0.5 mg Nebulization BID  . calcium-vitamin D  1 tablet Oral Daily  . chlorhexidine  15 mL Mouth Rinse BID  . Chlorhexidine Gluconate Cloth  6 each Topical Daily  . citalopram  10 mg Oral QHS  . enoxaparin (LOVENOX) injection  40 mg Subcutaneous QHS  . guaiFENesin  1,200 mg Oral BID  . ipratropium  2 spray Each Nare QID  . ipratropium-albuterol  3 mL Nebulization Q6H  . mouth rinse  15 mL Mouth Rinse q12n4p  . methylPREDNISolone (SOLU-MEDROL) injection  60 mg Intravenous Q8H  . montelukast  10 mg Oral QHS   Continuous Infusions: . sodium chloride 10 mL/hr at 08/22/18 1303     LOS: 11 days   Marylu Lund, MD [facility] Pager On Amion  If 7PM-7AM, please contact night-coverage 08/22/2018, 3:14 PM

## 2018-08-22 NOTE — Progress Notes (Signed)
Palliative Care Progress Note  Reason for consult: Goals of care in light of advanced COPD  I met today with Courtney Ayers.  She is much less short of breath with conversation today.  She invited me for further discussion about her long term care plan in light of her advanced lung disease.  She is very open and understanding that her illness will continue to progress, and she is hopeful for some improvement and stabilization in function to allow her to spend time with family and continue to prepare her daughters for when the time comes that she does die.  She relies heavily on her sister and they have been discussing all of the information that we talked about yesterday.  - Courtney Ayers is not interested in hospice care at this time.  Her goal is to continue current interventions with hope that she will continue to improve followed by follow-up with Dr. Lake Bells to further discuss long term goals. - She reports today that she continues to discuss with her sister regarding her wishes moving forward.  Her primary concern remains ensuring that her daughters (twins age 23) are as prepared as possible for when she passes. - I left a copy of [facility] for [place] for her to review.  Questions and concerns addressed.   PMT will continue to support holistically.  Total time: 30 minutes Greater than 50%  of this time was spent counseling and coordinating care related to the above assessment and plan.  Micheline Rough, MD [place] [facility] [phone]

## 2018-08-23 NOTE — Progress Notes (Signed)
PROGRESS NOTE    Courtney Ayers  CVE:[id number] DOB: [birth year] DOA: 08/11/2018 PCP: [name], MD    Brief Narrative:  73 y.o. female with a history of chronic hypoxic respiratory failure, COPD, HTN, depression who presented with dyspnea and cough. She was prescribed prednisone and doxycycline after she reported to be too weak to present to pulmonology office visit but had GI side effects limiting this to a single dose. In the ED she was tachypneic, wheezing with fever of 101.60F. CXR without definite infiltrate, viral panel negative, no recent travel to implicate coronavirus. She was given IV steroids, ceftriaxone, and continues to feel severely dyspneic with oxygen requirement up to 5LPM. Her pulmonologist was consulted and recommendations given.  Assessment & Plan:   Principal Problem:   COPD with acute exacerbation ([place]) Active Problems:   Acute on chronic respiratory failure with hypoxia (HCC)   Chronic respiratory failure with hypoxia (HCC)   HTN (hypertension)   Tachypnea  Acute on chronic hypoxic respiratory failure:  -Likely secondary to COPD exacerbation, presumed acute infectious process involved.  - Continue BiPAP support as needed with rest off with nasal cannula. Palliative Care recommended by Pulmonary, consulted - Continued on PRN morphine for comfort - Continue supplemental oxygen, but would not target SpO2 >92%. - Completed empiric ceftriaxone empirically 3/3-3/7 and azithromycin 3/6-3/10.  - Continued on IV steroids. Cont to wean as tolerated - cont bronchodilators as tolerated - Weaned to [facility], still needing BiPAP at night. Will discuss with CM regarding possibility of arranging home BiPAP  Depression, anxiety:  - Continue SSRI as tolerated - Continue low dose xanax prn, newly added. - presently stable  HTN: Continue prn hydralazine -BP currently stable  DVT prophylaxis: Lovenox subQ Code Status: No CPR, no Cardioversion Family Communication: Pt  in room, family not at bedside Disposition Plan: Uncertain at this time  Consultants:   PCCM  Palliative Care  Procedures:     Antimicrobials: Anti-infectives (From admission, onward)   Start     Dose/Rate Route Frequency Ordered Stop   08/14/18 1200  azithromycin (ZITHROMAX) 250 mg in dextrose 5 % 125 mL IVPB     250 mg 125 mL/hr over 60 Minutes Intravenous Every 24 hours 08/14/18 1146 08/18/18 1314   08/13/18 2200  cefTRIAXone (ROCEPHIN) 1 g in sodium chloride 0.9 % 100 mL IVPB     1 g 200 mL/hr over 30 Minutes Intravenous Every 24 hours 08/13/18 1743 08/14/18 2138   08/12/18 2200  cefTRIAXone (ROCEPHIN) 1 g in sodium chloride 0.9 % 100 mL IVPB  Status:  Discontinued     1 g 200 mL/hr over 30 Minutes Intravenous Every 24 hours 08/11/18 2243 08/12/18 0833   08/12/18 2200  cefTRIAXone (ROCEPHIN) 1 g in sodium chloride 0.9 % 100 mL IVPB  Status:  Discontinued     1 g 200 mL/hr over 30 Minutes Intravenous Every 24 hours 08/12/18 1507 08/13/18 1743   08/11/18 2045  cefTRIAXone (ROCEPHIN) 1 g in sodium chloride 0.9 % 100 mL IVPB     1 g 200 mL/hr over 30 Minutes Intravenous  Once 08/11/18 2044 08/11/18 2155   08/11/18 2045  azithromycin (ZITHROMAX) tablet 500 mg     500 mg Oral  Once 08/11/18 2044 08/11/18 2153      Subjective: Without complaints. Reports breathing well this AM  Objective: Vitals:   08/23/18 1300 08/23/18 1343 08/23/18 1400 08/23/18 1500  BP: 135/77   (!) 142/73  Pulse: 72   70  Resp:  18   (!) 8  Temp:      TempSrc:      SpO2: 96% 97% 96% 94%  Weight:      Height:        Intake/Output Summary (Last 24 hours) at 08/23/2018 1557 Last data filed at 08/23/2018 0940 Gross per 24 hour  Intake 522.51 ml  Output 1700 ml  Net -1177.49 ml   Filed Weights   08/11/18 1711 08/14/18 0918  Weight: 62.6 kg 63.6 kg    Examination: General exam: Awake, laying in bed, in nad Respiratory system: Normal respiratory effort, no wheezing Cardiovascular system:  regular rate, s1, s2 Gastrointestinal system: Soft, nondistended, positive BS Central nervous system: CN2-12 grossly intact, strength intact Extremities: Perfused, no clubbing Skin: Normal skin turgor, no notable skin lesions seen Psychiatry: Mood normal // no visual hallucinations   Data Reviewed: I have personally reviewed following labs and imaging studies  CBC: Recent Labs  Lab 08/18/18 0658  WBC 15.1*  HGB 13.4  HCT 44.1  MCV 101.6*  PLT 093   Basic Metabolic Panel: Recent Labs  Lab 08/18/18 0658 08/19/18 0313 08/20/18 0241 08/21/18 0251 08/22/18 0257  NA 141  --   --   --   --   K 4.2  --   --   --   --   CL 96*  --   --   --   --   CO2 38*  --   --   --   --   GLUCOSE 120*  --   --   --   --   BUN 23  --   --   --   --   CREATININE 0.51  --   --   --   --   CALCIUM 8.5*  --   --   --   --   MG  --  2.4 2.3 2.3 2.2  PHOS  --  3.2 2.9 3.4 3.2   GFR: Estimated Creatinine Clearance: 60.9 mL/min (by C-G formula based on SCr of 0.51 mg/dL). Liver Function Tests: No results for input(s): AST, ALT, ALKPHOS, BILITOT, PROT, ALBUMIN in the last 168 hours. No results for input(s): LIPASE, AMYLASE in the last 168 hours. No results for input(s): AMMONIA in the last 168 hours. Coagulation Profile: No results for input(s): INR, PROTIME in the last 168 hours. Cardiac Enzymes: No results for input(s): CKTOTAL, CKMB, CKMBINDEX, TROPONINI in the last 168 hours. BNP (last 3 results) No results for input(s): PROBNP in the last 8760 hours. HbA1C: No results for input(s): HGBA1C in the last 72 hours. CBG: No results for input(s): GLUCAP in the last 168 hours. Lipid Profile: No results for input(s): CHOL, HDL, LDLCALC, TRIG, CHOLHDL, LDLDIRECT in the last 72 hours. Thyroid Function Tests: No results for input(s): TSH, T4TOTAL, FREET4, T3FREE, THYROIDAB in the last 72 hours. Anemia Panel: No results for input(s): VITAMINB12, FOLATE, FERRITIN, TIBC, IRON, RETICCTPCT in the last  72 hours. Sepsis Labs: No results for input(s): PROCALCITON, LATICACIDVEN in the last 168 hours.  Recent Results (from the past 240 hour(s))  MRSA PCR Screening     Status: None   Collection Time: 08/14/18  1:27 PM  Result Value Ref Range Status   MRSA by PCR NEGATIVE NEGATIVE Final    Comment:        The GeneXpert MRSA Assay (FDA approved for NASAL specimens only), is one component of a comprehensive MRSA colonization surveillance program. It is not intended to diagnose MRSA  infection nor to guide or monitor treatment for MRSA infections. Performed at [facility], [place] [address] [place], [place] [address]   Respiratory Panel by PCR     Status: None   Collection Time: 08/18/18 10:58 AM  Result Value Ref Range Status   Adenovirus NOT DETECTED NOT DETECTED Final   Coronavirus 229E NOT DETECTED NOT DETECTED Final    Comment: (NOTE) The Coronavirus on the Respiratory Panel, DOES NOT test for the novel  Coronavirus (2019 nCoV)    Coronavirus HKU1 NOT DETECTED NOT DETECTED Final   Coronavirus NL63 NOT DETECTED NOT DETECTED Final   Coronavirus OC43 NOT DETECTED NOT DETECTED Final   Metapneumovirus NOT DETECTED NOT DETECTED Final   Rhinovirus / Enterovirus NOT DETECTED NOT DETECTED Final   Influenza A NOT DETECTED NOT DETECTED Final   Influenza B NOT DETECTED NOT DETECTED Final   Parainfluenza Virus 1 NOT DETECTED NOT DETECTED Final   Parainfluenza Virus 2 NOT DETECTED NOT DETECTED Final   Parainfluenza Virus 3 NOT DETECTED NOT DETECTED Final   Parainfluenza Virus 4 NOT DETECTED NOT DETECTED Final   Respiratory Syncytial Virus NOT DETECTED NOT DETECTED Final   Bordetella pertussis NOT DETECTED NOT DETECTED Final   Chlamydophila pneumoniae NOT DETECTED NOT DETECTED Final   Mycoplasma pneumoniae NOT DETECTED NOT DETECTED Final    Comment: Performed at [facility], [address], [place], [place] [address]     Radiology Studies: No results found.   Scheduled Meds: . arformoterol  15 mcg Nebulization BID  . budesonide (PULMICORT) nebulizer solution  0.5 mg Nebulization BID  . calcium-vitamin D  1 tablet Oral Daily  . chlorhexidine  15 mL Mouth Rinse BID  . Chlorhexidine Gluconate Cloth  6 each Topical Daily  . citalopram  10 mg Oral QHS  . enoxaparin (LOVENOX) injection  40 mg Subcutaneous QHS  . guaiFENesin  1,200 mg Oral BID  . ipratropium  2 spray Each Nare QID  . ipratropium-albuterol  3 mL Nebulization Q6H  . mouth rinse  15 mL Mouth Rinse q12n4p  . methylPREDNISolone (SOLU-MEDROL) injection  60 mg Intravenous Q8H  . montelukast  10 mg Oral QHS   Continuous Infusions: . sodium chloride 10 mL/hr at 08/23/18 1500     LOS: 12 days   Marylu Lund, MD [facility] Pager On Amion  If 7PM-7AM, please contact night-coverage 08/23/2018, 3:57 PM

## 2018-08-23 NOTE — Progress Notes (Signed)
Pt may benefit from a BIPAP QHS at home. Pt also need to to get in a chair and stand some.

## 2018-08-23 NOTE — Progress Notes (Signed)
Pt transferred from room 1237 to room 1234. Pt's sister made aware, per pt's request. Sister expressed thanks for making her aware. VWilliams,RN.

## 2018-08-23 NOTE — Progress Notes (Signed)
Pt. Removed from bipap at this time per her request for a break to relieve pressure off her face, which is quite red.

## 2018-08-24 MED ORDER — IPRATROPIUM-ALBUTEROL 0.5-2.5 (3) MG/3ML IN SOLN
3.0000 mL | Freq: Three times a day (TID) | RESPIRATORY_TRACT | Status: DC
  Administered 2018-08-25 – 2018-09-03 (×28): 3 mL via RESPIRATORY_TRACT
  Filled 2018-08-24 (×29): qty 3

## 2018-08-24 NOTE — Progress Notes (Signed)
PROGRESS NOTE    Courtney Ayers  MWN:[id number] DOB: Dec 20, 1945 DOA: 08/11/2018 PCP: [name], MD    Brief Narrative:  73 y.o. female with a history of chronic hypoxic respiratory failure, COPD, HTN, depression who presented with dyspnea and cough. She was prescribed prednisone and doxycycline after she reported to be too weak to present to pulmonology office visit but had GI side effects limiting this to a single dose. In the ED she was tachypneic, wheezing with fever of 101.84F. CXR without definite infiltrate, viral panel negative, no recent travel to implicate coronavirus. She was given IV steroids, ceftriaxone, and continues to feel severely dyspneic with oxygen requirement up to 5LPM. Her pulmonologist was consulted and recommendations given.  Assessment & Plan:   Principal Problem:   COPD with acute exacerbation ([place]) Active Problems:   Acute on chronic respiratory failure with hypoxia (HCC)   Chronic respiratory failure with hypoxia (HCC)   HTN (hypertension)   Tachypnea  Acute on chronic hypoxic respiratory failure:  -Likely secondary to COPD exacerbation, presumed acute infectious process involved.  - Continue BiPAP support as needed with rest off with nasal cannula. Palliative Care recommended by Pulmonary, consulted - Continued on PRN morphine for comfort - Continue supplemental oxygen, but would not target SpO2 >92%. - Completed empiric ceftriaxone empirically 3/3-3/7 and azithromycin 3/6-3/10.  - Continued on IV steroids. Cont to wean as tolerated - cont bronchodilators as tolerated - Weaned to [facility], on bipap this AM. Continue to wean as tolerated  Depression, anxiety:  - Continue SSRI as tolerated - Continue low dose xanax prn, newly added. - presently stable  HTN: Continue prn hydralazine -BP presently stable  DVT prophylaxis: Lovenox subQ Code Status: No CPR, no Cardioversion Family Communication: Pt in room, family not at bedside Disposition Plan:  Uncertain at this time  Consultants:   PCCM  Palliative Care  Procedures:     Antimicrobials: Anti-infectives (From admission, onward)   Start     Dose/Rate Route Frequency Ordered Stop   08/14/18 1200  azithromycin (ZITHROMAX) 250 mg in dextrose 5 % 125 mL IVPB     250 mg 125 mL/hr over 60 Minutes Intravenous Every 24 hours 08/14/18 1146 08/18/18 1314   08/13/18 2200  cefTRIAXone (ROCEPHIN) 1 g in sodium chloride 0.9 % 100 mL IVPB     1 g 200 mL/hr over 30 Minutes Intravenous Every 24 hours 08/13/18 1743 08/14/18 2138   08/12/18 2200  cefTRIAXone (ROCEPHIN) 1 g in sodium chloride 0.9 % 100 mL IVPB  Status:  Discontinued     1 g 200 mL/hr over 30 Minutes Intravenous Every 24 hours 08/11/18 2243 08/12/18 0833   08/12/18 2200  cefTRIAXone (ROCEPHIN) 1 g in sodium chloride 0.9 % 100 mL IVPB  Status:  Discontinued     1 g 200 mL/hr over 30 Minutes Intravenous Every 24 hours 08/12/18 1507 08/13/18 1743   08/11/18 2045  cefTRIAXone (ROCEPHIN) 1 g in sodium chloride 0.9 % 100 mL IVPB     1 g 200 mL/hr over 30 Minutes Intravenous  Once 08/11/18 2044 08/11/18 2155   08/11/18 2045  azithromycin (ZITHROMAX) tablet 500 mg     500 mg Oral  Once 08/11/18 2044 08/11/18 2153      Subjective: When seen, on bipap. Requesting bipap to be removed  Objective: Vitals:   08/24/18 0900 08/24/18 0938 08/24/18 1000 08/24/18 1200  BP:   (!) 170/73 (!) 148/73  Pulse: 73  91 89  Resp: 18  (!)  26 (!) 29  Temp:      TempSrc:      SpO2: 100% 95% 96% 92%  Weight:      Height:        Intake/Output Summary (Last 24 hours) at 08/24/2018 1409 Last data filed at 08/23/2018 2300 Gross per 24 hour  Intake 216.81 ml  Output 800 ml  Net -583.19 ml   Filed Weights   08/11/18 1711 08/14/18 0918  Weight: 62.6 kg 63.6 kg    Examination: General exam: Conversant, in no acute distress Respiratory system: normal chest rise, clear, no audible wheezing Cardiovascular system: regular rhythm, s1-s2  Gastrointestinal system: Nondistended, nontender, pos BS Central nervous system: No seizures, no tremors Extremities: No cyanosis, no joint deformities Skin: No rashes, no pallor Psychiatry: Affect normal // no auditory hallucinations   Data Reviewed: I have personally reviewed following labs and imaging studies  CBC: Recent Labs  Lab 08/18/18 0658  WBC 15.1*  HGB 13.4  HCT 44.1  MCV 101.6*  PLT 283   Basic Metabolic Panel: Recent Labs  Lab 08/18/18 0658 08/19/18 0313 08/20/18 0241 08/21/18 0251 08/22/18 0257  NA 141  --   --   --   --   K 4.2  --   --   --   --   CL 96*  --   --   --   --   CO2 38*  --   --   --   --   GLUCOSE 120*  --   --   --   --   BUN 23  --   --   --   --   CREATININE 0.51  --   --   --   --   CALCIUM 8.5*  --   --   --   --   MG  --  2.4 2.3 2.3 2.2  PHOS  --  3.2 2.9 3.4 3.2   GFR: Estimated Creatinine Clearance: 60.9 mL/min (by C-G formula based on SCr of 0.51 mg/dL). Liver Function Tests: No results for input(s): AST, ALT, ALKPHOS, BILITOT, PROT, ALBUMIN in the last 168 hours. No results for input(s): LIPASE, AMYLASE in the last 168 hours. No results for input(s): AMMONIA in the last 168 hours. Coagulation Profile: No results for input(s): INR, PROTIME in the last 168 hours. Cardiac Enzymes: No results for input(s): CKTOTAL, CKMB, CKMBINDEX, TROPONINI in the last 168 hours. BNP (last 3 results) No results for input(s): PROBNP in the last 8760 hours. HbA1C: No results for input(s): HGBA1C in the last 72 hours. CBG: No results for input(s): GLUCAP in the last 168 hours. Lipid Profile: No results for input(s): CHOL, HDL, LDLCALC, TRIG, CHOLHDL, LDLDIRECT in the last 72 hours. Thyroid Function Tests: No results for input(s): TSH, T4TOTAL, FREET4, T3FREE, THYROIDAB in the last 72 hours. Anemia Panel: No results for input(s): VITAMINB12, FOLATE, FERRITIN, TIBC, IRON, RETICCTPCT in the last 72 hours. Sepsis Labs: No results for  input(s): PROCALCITON, LATICACIDVEN in the last 168 hours.  Recent Results (from the past 240 hour(s))  Respiratory Panel by PCR     Status: None   Collection Time: 08/18/18 10:58 AM  Result Value Ref Range Status   Adenovirus NOT DETECTED NOT DETECTED Final   Coronavirus 229E NOT DETECTED NOT DETECTED Final    Comment: (NOTE) The Coronavirus on the Respiratory Panel, DOES NOT test for the novel  Coronavirus (2019 nCoV)    Coronavirus HKU1 NOT DETECTED NOT DETECTED Final   Coronavirus  NL63 NOT DETECTED NOT DETECTED Final   Coronavirus OC43 NOT DETECTED NOT DETECTED Final   Metapneumovirus NOT DETECTED NOT DETECTED Final   Rhinovirus / Enterovirus NOT DETECTED NOT DETECTED Final   Influenza A NOT DETECTED NOT DETECTED Final   Influenza B NOT DETECTED NOT DETECTED Final   Parainfluenza Virus 1 NOT DETECTED NOT DETECTED Final   Parainfluenza Virus 2 NOT DETECTED NOT DETECTED Final   Parainfluenza Virus 3 NOT DETECTED NOT DETECTED Final   Parainfluenza Virus 4 NOT DETECTED NOT DETECTED Final   Respiratory Syncytial Virus NOT DETECTED NOT DETECTED Final   Bordetella pertussis NOT DETECTED NOT DETECTED Final   Chlamydophila pneumoniae NOT DETECTED NOT DETECTED Final   Mycoplasma pneumoniae NOT DETECTED NOT DETECTED Final    Comment: Performed at [facility], [place] [address], [place], [place] [address]     Radiology Studies: No results found.  Scheduled Meds: . arformoterol  15 mcg Nebulization BID  . budesonide (PULMICORT) nebulizer solution  0.5 mg Nebulization BID  . calcium-vitamin D  1 tablet Oral Daily  . chlorhexidine  15 mL Mouth Rinse BID  . Chlorhexidine Gluconate Cloth  6 each Topical Daily  . citalopram  10 mg Oral QHS  . enoxaparin (LOVENOX) injection  40 mg Subcutaneous QHS  . guaiFENesin  1,200 mg Oral BID  . ipratropium  2 spray Each Nare QID  . ipratropium-albuterol  3 mL Nebulization Q6H  . mouth rinse  15 mL Mouth Rinse q12n4p  . methylPREDNISolone  (SOLU-MEDROL) injection  60 mg Intravenous Q8H  . montelukast  10 mg Oral QHS   Continuous Infusions: . sodium chloride Stopped (08/23/18 1758)     LOS: 13 days   Courtney Lund, MD [facility] Pager On Amion  If 7PM-7AM, please contact night-coverage 08/24/2018, 2:09 PM

## 2018-08-24 NOTE — Progress Notes (Signed)
Palliative Care Progress Note  Reason for consult: Goals of care in light of advanced COPD  I met today with Courtney Ayers.  She reports that she is appreciative of her care and conversation regarding goals in the future ad denies further questions or needs at this time.  Reports that she "hope I wasn't rude" to chaplain but was feeling SOB and unable to discuss advance directive earlier today.  - Courtney Ayers is not interested in hospice care at this time.  Her goal is to continue current interventions with hope that she will continue to improve followed by follow-up with Dr. Lake Bells to further discuss long term goals. - She reports today that she continues to discuss with her sister regarding her wishes moving forward.  Her primary concern remains ensuring that her daughters (twins age 11) are as prepared as possible for when she passes. - Reports she would like to further discuss HCPOA documentation with spiritual care.  Questions and concerns addressed.   PMT will continue to support holistically.  Total time: 20 minutes Greater than 50%  of this time was spent counseling and coordinating care related to the above assessment and plan.  Micheline Rough, MD [facility] [phone]

## 2018-08-24 NOTE — Progress Notes (Signed)
   08/24/18 1000  Clinical Encounter Type  Visited With Patient  Visit Type Initial;Psychological support;Spiritual support;Critical Care  Referral From Nurse  Consult/Referral To Chaplain  Spiritual Encounters  Spiritual Needs Brochure;Other (Comment) (Spiritual Care Conversation/Support)  Stress Factors  Patient Stress Factors None identified;Other (Comment) (Patient having hard time breathing and could not talk at [facility])   I briefly spoke with the patient per spiritual care consult to discuss a healthcare power of attorney. The patient stated that she was having a hard time breathing and requested to discuss this at a later time.   Please, contact Spiritual Care for further assistance.   Chaplain Shanon Ace M.Div., [facility]

## 2018-08-25 LAB — URINALYSIS, ROUTINE W REFLEX MICROSCOPIC
Bilirubin Urine: NEGATIVE
Glucose, UA: NEGATIVE mg/dL
Hgb urine dipstick: NEGATIVE
Ketones, ur: NEGATIVE mg/dL
Nitrite: NEGATIVE
Protein, ur: NEGATIVE mg/dL
Specific Gravity, Urine: 1.026 (ref 1.005–1.030)
pH: 5 (ref 5.0–8.0)

## 2018-08-25 LAB — CBC
HCT: 42 % (ref 36.0–46.0)
Hemoglobin: 13.2 g/dL (ref 12.0–15.0)
MCH: 31.1 pg (ref 26.0–34.0)
MCHC: 31.4 g/dL (ref 30.0–36.0)
MCV: 99.1 fL (ref 80.0–100.0)
Platelets: 228 10*3/uL (ref 150–400)
RBC: 4.24 MIL/uL (ref 3.87–5.11)
RDW: 12.9 % (ref 11.5–15.5)
WBC: 15.5 10*3/uL — ABNORMAL HIGH (ref 4.0–10.5)
nRBC: 0 % (ref 0.0–0.2)

## 2018-08-25 LAB — CREATININE, SERUM
CREATININE: 0.47 mg/dL (ref 0.44–1.00)
GFR calc Af Amer: >60 mL/min (ref >60)
GFR calc non Af Amer: >60 mL/min (ref >60)

## 2018-08-25 NOTE — Progress Notes (Signed)
PROGRESS NOTE    Courtney Ayers  UXN:[id number] DOB: 07/21/1945 DOA: 08/11/2018 PCP: [name], MD    Brief Narrative:  73 y.o. female with a history of chronic hypoxic respiratory failure, COPD, HTN, depression who presented with dyspnea and cough. She was prescribed prednisone and doxycycline after she reported to be too weak to present to pulmonology office visit but had GI side effects limiting this to a single dose. In the ED she was tachypneic, wheezing with fever of 101.31F. CXR without definite infiltrate, viral panel negative, no recent travel to implicate coronavirus. She was given IV steroids, ceftriaxone, and continues to feel severely dyspneic with oxygen requirement up to 5LPM. Her pulmonologist was consulted and recommendations given.  Assessment & Plan:   Principal Problem:   COPD with acute exacerbation ([place]) Active Problems:   Acute on chronic respiratory failure with hypoxia (HCC)   Chronic respiratory failure with hypoxia (HCC)   HTN (hypertension)   Tachypnea  Acute on chronic hypoxic respiratory failure:  -Likely secondary to COPD exacerbation, presumed acute infectious process involved.  - Continue BiPAP support as needed with rest off with nasal cannula. Palliative Care recommended by Pulmonary, consulted - Continued on PRN morphine for comfort - Continue supplemental oxygen, but would not target SpO2 >92%. - Completed empiric ceftriaxone empirically 3/3-3/7 and azithromycin 3/6-3/10.  - Continued on IV steroids. Cont to wean as tolerated - cont bronchodilators as tolerated - Weaned to [facility]. Still hypoxemic and requiring O2. Baseline O2 noted to be 2LNC. Still needing bipap at night  Depression, anxiety:  - Continue SSRI as tolerated - Continue low dose xanax prn, newly added. - currenty stable  HTN: Continue prn hydralazine -BP currently stable  DVT prophylaxis: Lovenox subQ Code Status: No CPR, no Cardioversion Family Communication: Pt in  room, family not at bedside Disposition Plan: Uncertain at this time  Consultants:   PCCM  Palliative Care  Procedures:     Antimicrobials: Anti-infectives (From admission, onward)   Start     Dose/Rate Route Frequency Ordered Stop   08/14/18 1200  azithromycin (ZITHROMAX) 250 mg in dextrose 5 % 125 mL IVPB     250 mg 125 mL/hr over 60 Minutes Intravenous Every 24 hours 08/14/18 1146 08/18/18 1314   08/13/18 2200  cefTRIAXone (ROCEPHIN) 1 g in sodium chloride 0.9 % 100 mL IVPB     1 g 200 mL/hr over 30 Minutes Intravenous Every 24 hours 08/13/18 1743 08/14/18 2138   08/12/18 2200  cefTRIAXone (ROCEPHIN) 1 g in sodium chloride 0.9 % 100 mL IVPB  Status:  Discontinued     1 g 200 mL/hr over 30 Minutes Intravenous Every 24 hours 08/11/18 2243 08/12/18 0833   08/12/18 2200  cefTRIAXone (ROCEPHIN) 1 g in sodium chloride 0.9 % 100 mL IVPB  Status:  Discontinued     1 g 200 mL/hr over 30 Minutes Intravenous Every 24 hours 08/12/18 1507 08/13/18 1743   08/11/18 2045  cefTRIAXone (ROCEPHIN) 1 g in sodium chloride 0.9 % 100 mL IVPB     1 g 200 mL/hr over 30 Minutes Intravenous  Once 08/11/18 2044 08/11/18 2155   08/11/18 2045  azithromycin (ZITHROMAX) tablet 500 mg     500 mg Oral  Once 08/11/18 2044 08/11/18 2153      Subjective: Reports feeling more sob overnight, requiring additional O2  Objective: Vitals:   08/25/18 0800 08/25/18 0842 08/25/18 1014 08/25/18 1017  BP: (!) 161/85   (!) 147/68  Pulse: 82  79  80  Resp: 19  (!) 27 (!) 24  Temp: 97.9 F (36.6 C)     TempSrc: Oral     SpO2: 99% 95% 98% 98%  Weight:      Height:        Intake/Output Summary (Last 24 hours) at 08/25/2018 1231 Last data filed at 08/25/2018 0600 Gross per 24 hour  Intake -  Output 1015 ml  Net -1015 ml   Filed Weights   08/11/18 1711 08/14/18 0918  Weight: 62.6 kg 63.6 kg    Examination: General exam: Conversant, in no acute distress, Box in place Respiratory system: normal chest rise,  clear, no audible wheezing Cardiovascular system: regular rhythm, s1-s2 Gastrointestinal system: Nondistended, nontender, pos BS Central nervous system: No seizures, no tremors Extremities: No cyanosis, no joint deformities Skin: No rashes, no pallor Psychiatry: Affect normal // no auditory hallucinations   Data Reviewed: I have personally reviewed following labs and imaging studies  CBC: Recent Labs  Lab 08/25/18 0649  WBC 15.5*  HGB 13.2  HCT 42.0  MCV 99.1  PLT 833   Basic Metabolic Panel: Recent Labs  Lab 08/19/18 0313 08/20/18 0241 08/21/18 0251 08/22/18 0257 08/25/18 0649  CREATININE  --   --   --   --  0.47  MG 2.4 2.3 2.3 2.2  --   PHOS 3.2 2.9 3.4 3.2  --    GFR: Estimated Creatinine Clearance: 60.9 mL/min (by C-G formula based on SCr of 0.47 mg/dL). Liver Function Tests: No results for input(s): AST, ALT, ALKPHOS, BILITOT, PROT, ALBUMIN in the last 168 hours. No results for input(s): LIPASE, AMYLASE in the last 168 hours. No results for input(s): AMMONIA in the last 168 hours. Coagulation Profile: No results for input(s): INR, PROTIME in the last 168 hours. Cardiac Enzymes: No results for input(s): CKTOTAL, CKMB, CKMBINDEX, TROPONINI in the last 168 hours. BNP (last 3 results) No results for input(s): PROBNP in the last 8760 hours. HbA1C: No results for input(s): HGBA1C in the last 72 hours. CBG: No results for input(s): GLUCAP in the last 168 hours. Lipid Profile: No results for input(s): CHOL, HDL, LDLCALC, TRIG, CHOLHDL, LDLDIRECT in the last 72 hours. Thyroid Function Tests: No results for input(s): TSH, T4TOTAL, FREET4, T3FREE, THYROIDAB in the last 72 hours. Anemia Panel: No results for input(s): VITAMINB12, FOLATE, FERRITIN, TIBC, IRON, RETICCTPCT in the last 72 hours. Sepsis Labs: No results for input(s): PROCALCITON, LATICACIDVEN in the last 168 hours.  Recent Results (from the past 240 hour(s))  Respiratory Panel by PCR     Status: None    Collection Time: 08/18/18 10:58 AM  Result Value Ref Range Status   Adenovirus NOT DETECTED NOT DETECTED Final   Coronavirus 229E NOT DETECTED NOT DETECTED Final    Comment: (NOTE) The Coronavirus on the Respiratory Panel, DOES NOT test for the novel  Coronavirus (2019 nCoV)    Coronavirus HKU1 NOT DETECTED NOT DETECTED Final   Coronavirus NL63 NOT DETECTED NOT DETECTED Final   Coronavirus OC43 NOT DETECTED NOT DETECTED Final   Metapneumovirus NOT DETECTED NOT DETECTED Final   Rhinovirus / Enterovirus NOT DETECTED NOT DETECTED Final   Influenza A NOT DETECTED NOT DETECTED Final   Influenza B NOT DETECTED NOT DETECTED Final   Parainfluenza Virus 1 NOT DETECTED NOT DETECTED Final   Parainfluenza Virus 2 NOT DETECTED NOT DETECTED Final   Parainfluenza Virus 3 NOT DETECTED NOT DETECTED Final   Parainfluenza Virus 4 NOT DETECTED NOT DETECTED Final   Respiratory  Syncytial Virus NOT DETECTED NOT DETECTED Final   Bordetella pertussis NOT DETECTED NOT DETECTED Final   Chlamydophila pneumoniae NOT DETECTED NOT DETECTED Final   Mycoplasma pneumoniae NOT DETECTED NOT DETECTED Final    Comment: Performed at [facility], [place] [address], [place], [place] [address]     Radiology Studies: No results found.  Scheduled Meds: . arformoterol  15 mcg Nebulization BID  . budesonide (PULMICORT) nebulizer solution  0.5 mg Nebulization BID  . calcium-vitamin D  1 tablet Oral Daily  . chlorhexidine  15 mL Mouth Rinse BID  . Chlorhexidine Gluconate Cloth  6 each Topical Daily  . citalopram  10 mg Oral QHS  . enoxaparin (LOVENOX) injection  40 mg Subcutaneous QHS  . guaiFENesin  1,200 mg Oral BID  . ipratropium  2 spray Each Nare QID  . ipratropium-albuterol  3 mL Nebulization TID  . mouth rinse  15 mL Mouth Rinse q12n4p  . methylPREDNISolone (SOLU-MEDROL) injection  60 mg Intravenous Q8H  . montelukast  10 mg Oral QHS   Continuous Infusions: . sodium chloride Stopped (08/23/18 1758)      LOS: 14 days   Marylu Lund, MD [facility] Pager On Amion  If 7PM-7AM, please contact night-coverage 08/25/2018, 12:31 PM

## 2018-08-26 DIAGNOSIS — R5381 Other malaise: Secondary | ICD-10-CM

## 2018-08-26 MED ORDER — BUSPIRONE HCL 5 MG PO TABS
5.0000 mg | ORAL_TABLET | Freq: Two times a day (BID) | ORAL | Status: DC
  Administered 2018-08-26 (×2): 5 mg via ORAL
  Filled 2018-08-26 (×2): qty 1

## 2018-08-26 NOTE — Progress Notes (Addendum)
Patient ID: Courtney Ayers, female   DOB: 1946-01-23, 73 y.o.   MRN: [id number]  This NP visited patient at the bedside as a follow up for palliative medicine needs and emotional support.    Patient met last week with a palliative medicine provider and verbalizes desire to meet again with her sister being present to continue conversation regarding diagnosis, prognosis, goals of care, end-of-life wishes, disposition and options.  Met today with patient and her sister Courtney Ayers and had a detailed discussion regarding the above.  Patient was able to verbalize an understanding of the seriousness of her current medical situation.  Today she made the decsion for no CPR,compression or intubation, however at this time she remains open to all other  offered and available medical interventions to prolong life.  She is hopeful for improvement   She hopes to stabilize and access short-term rehabilitation with the ultimate goal of returning home.   Discussed the significance  /hope of decreasing need for BiPAP at night.  Her two  59 year old daughters live at home but have active lives of their own and are little support to her.  Her sister Courtney Ayers who lives nearby is her main support person.  Created space and opportunity for patient to explore her thoughts and feelings regarding her current medical situation.   We discussed concepts specific to mortality and the limitations of medical interventions when the body begins to fail to thrive.   Patient was able to share her frustrations and losing her health and independence.  She speaks to her active life including fox hunting, downhill skiing and waterskiing.  Discussed with patient the importance of continued conversation with her family and the medical providers regarding overall plan of care and treatment options,  ensuring decisions are within the context of the patients values and GOCs.  Questions and concerns addressed.    Discussed with Dr Benny Lennert  I  let the patient and attending know that this nurse practitioner would not be back in the hospital until Monday however palliative medicine provider can be reached for questions and concerns by calling the team phone.  I will f/u on Monday   Total time spent on the unit was 60 minutes  Greater than 50% of the time was spent in counseling and coordination of care  Wadie Lessen NP  Palliative Medicine Team Team Phone # [phone] Pager [phone]

## 2018-08-26 NOTE — Evaluation (Signed)
Physical Therapy Evaluation Patient Details Name: Courtney Ayers MRN: [id number] DOB: March 14, 1946 Today's Date: 08/26/2018   History of Present Illness  pt was admitted for acute COPD exacerbation.  PMH:  OA and depression  Clinical Impression  The patient requires much extra time for mobilizing , rest breaks frequently. Patient  Self assisted legs and trunk. Patient very compromised  From respiratory standpoint. Patient on 2 liters [place] and saturation >90%. Pt admitted with above diagnosis. Pt currently with functional limitations due to the deficits listed below (see PT Problem List).  Pt will benefit from skilled PT to increase their independence and safety with mobility to allow discharge to the venue listed below.        Follow Up Recommendations Home health PT;Supervision/Assistance - 24 hour    Equipment Recommendations  (TBD)    Recommendations for Other Services       Precautions / Restrictions Precautions Precautions: Fall Precaution Comments: watch 02 Restrictions Weight Bearing Restrictions: No      Mobility  Bed Mobility Overal bed mobility: Needs Assistance Bed Mobility: Supine to Sit     Supine to sit: Min assist;HOB elevated     General bed mobility comments: pt helped to move her legs over with her hands. Fatiques quickly, extra time needed.  Min/Mod A to scoot forward  Transfers Overall transfer level: Needs assistance Equipment used: 1 person hand held assist Transfers: Sit to/from Stand Sit to Stand: Min assist         General transfer comment: steadying assistance to stand and complete SPT  Ambulation/Gait                Stairs            Wheelchair Mobility    Modified Rankin (Stroke Patients Only)       Balance                                             Pertinent Vitals/Pain Pain Assessment: No/denies pain    Home Living Family/patient expects to be discharged to:: Private residence Living  Arrangements: Children Available Help at Discharge: Family Type of Home: House Home Access: Stairs to enter Entrance Stairs-Rails: None Entrance Stairs-Number of Steps: 3 Home Layout: One level Home Equipment: Programme researcher, broadcasting/film/video - 2 wheels Additional Comments: doesn't use AD; sponge bathes.  74 y o daughter moved back in with her    Prior Function Level of Independence: Independent         Comments: for basic adls; daughter and sister help with IADLs. Pt has been able to do laundry     Hand Dominance   Dominant Hand: Left    Extremity/Trunk Assessment   Upper Extremity Assessment Upper Extremity Assessment: Generalized weakness    Lower Extremity Assessment Lower Extremity Assessment: Generalized weakness    Cervical / Trunk Assessment Cervical / Trunk Assessment: Kyphotic  Communication   Communication: No difficulties  Cognition Arousal/Alertness: Awake/alert Behavior During Therapy: WFL for tasks assessed/performed Overall Cognitive Status: Within Functional Limits for tasks assessed                                        General Comments      Exercises     Assessment/Plan    PT Assessment Patient needs continued  PT services  PT Problem List Decreased strength;Decreased activity tolerance;Decreased mobility;Decreased knowledge of precautions;Decreased safety awareness;Decreased knowledge of use of DME;Cardiopulmonary status limiting activity       PT Treatment Interventions DME instruction;Gait training;Functional mobility training;Therapeutic activities;Therapeutic exercise;Patient/family education    PT Goals (Current goals can be found in the Care Plan section)  Acute Rehab PT Goals Patient Stated Goal: get strength back PT Goal Formulation: With patient Time For Goal Achievement: 09/09/18 Potential to Achieve Goals: Fair    Frequency Min 3X/week   Barriers to discharge        Co-evaluation PT/OT/SLP  Co-Evaluation/Treatment: Yes Reason for Co-Treatment: For patient/therapist safety PT goals addressed during session: Mobility/safety with mobility OT goals addressed during session: ADL's and self-care       AM-PAC PT "6 Clicks" Mobility  Outcome Measure Help needed turning from your back to your side while in a flat bed without using bedrails?: A Little Help needed moving from lying on your back to sitting on the side of a flat bed without using bedrails?: A Little Help needed moving to and from a bed to a chair (including a wheelchair)?: A Lot Help needed standing up from a chair using your arms (e.g., wheelchair or bedside chair)?: A Lot Help needed to walk in hospital room?: A Lot Help needed climbing 3-5 steps with a railing? : Total 6 Click Score: 13    End of Session   Activity Tolerance: Treatment limited secondary to medical complications (Comment) Patient left: in chair;with call bell/phone within reach;with chair alarm set Nurse Communication: Mobility status PT Visit Diagnosis: Unsteadiness on feet (R26.81)    Time: 5329-9242 PT Time Calculation (min) (ACUTE ONLY): 41 min   Charges:   PT Evaluation $PT Eval Low Complexity: [place] Pager [phone] Office [phone]   Claretha Cooper 08/26/2018, 1:42 PM

## 2018-08-26 NOTE — Evaluation (Signed)
Occupational Therapy Evaluation Patient Details Name: Courtney Ayers MRN: [id number] DOB: 09/24/1945 Today's Date: 08/26/2018    History of Present Illness pt was admitted for acute COPD exacerbation.  PMH:  OA and depression   Clinical Impression   This 73 year old female was admitted for the above.  At baseline, she has been mod I for basic adls, and has been able to do laundry.  Her daughter and sister help with IADLs. Pt needs mostly min A at this time with extra times; however, she needed total A with hygiene/clothes management after SPT due to fatique. Will follow in acute setting with supervision level goals.    Follow Up Recommendations  Supervision/Assistance - 24 hour(depending upon progress)    Equipment Recommendations  3 in 1 bedside commode    Recommendations for Other Services       Precautions / Restrictions Precautions Precautions: Fall Precaution Comments: watch 02 Restrictions Weight Bearing Restrictions: No      Mobility Bed Mobility Overal bed mobility: Needs Assistance Bed Mobility: Supine to Sit     Supine to sit: Min assist;HOB elevated     General bed mobility comments: pt helped to move her legs over with her hands. Fatiques quickly, extra time needed.  Min/Mod A to scoot forward  Transfers Overall transfer level: Needs assistance Equipment used: 1 person hand held assist Transfers: Sit to/from Stand Sit to Stand: Min assist         General transfer comment: steadying assistance to stand and complete SPT    Balance                                           ADL either performed or assessed with clinical judgement   ADL Overall ADL's : Needs assistance/impaired Eating/Feeding: Independent   Grooming: Set up   Upper Body Bathing: Set up   Lower Body Bathing: Minimal assistance   Upper Body Dressing : Set up   Lower Body Dressing: Moderate assistance   Toilet Transfer: Min guard;Stand-pivot(to chair)    Toileting- Clothing Manipulation and Hygiene: Sitting/lateral lean;Total assistance         General ADL Comments: extra time for all activities.  Pt fatiqued after getting into chair. Needed total A for hygiene/changing underwear due to purewick shifting. Pt does cross legs to reach feet; all activities take extra time and she is good about pacing herself     Vision         Perception     Praxis      Pertinent Vitals/Pain Pain Assessment: No/denies pain     Hand Dominance Left   Extremity/Trunk Assessment Upper Extremity Assessment Upper Extremity Assessment: Generalized weakness           Communication Communication Communication: No difficulties   Cognition Arousal/Alertness: Awake/alert Behavior During Therapy: WFL for tasks assessed/performed Overall Cognitive Status: Within Functional Limits for tasks assessed                                     General Comments       Exercises     Shoulder Instructions      Home Living Family/patient expects to be discharged to:: Private residence Living Arrangements: Children                 Bathroom  Shower/Tub: Teacher, early years/pre: Standard     Home Equipment: Building services engineer Comments: doesn't use AD; sponge bathes.  43 y o daughter moved back in with her      Prior Functioning/Environment Level of Independence: Independent        Comments: for basic adls; daughter and sister help with IADLs. Pt has been able to do laundry        OT Problem List: Decreased strength;Decreased activity tolerance;Impaired balance (sitting and/or standing);Cardiopulmonary status limiting activity      OT Treatment/Interventions: Self-care/ADL training;DME and/or AE instruction;Therapeutic activities;Patient/family education;Balance training    OT Goals(Current goals can be found in the care plan section) Acute Rehab OT Goals Patient Stated Goal: get strength back OT Goal  Formulation: With patient Time For Goal Achievement: 09/09/18 Potential to Achieve Goals: Fair ADL Goals Pt Will Transfer to Toilet: with supervision;bedside commode;stand pivot transfer Pt Will Perform Toileting - Clothing Manipulation and hygiene: with supervision;sit to/from stand;sitting/lateral leans Additional ADL Goal #1: pt will complete adl with set up/extra time Additional ADL Goal #2: pt will complete bed mobility at mod I level from regular bed  OT Frequency: Min 2X/week   Barriers to D/C:            Co-evaluation PT/OT/SLP Co-Evaluation/Treatment: Yes Reason for Co-Treatment: For patient/therapist safety PT goals addressed during session: Mobility/safety with mobility OT goals addressed during session: ADL's and self-care      AM-PAC OT "6 Clicks" Daily Activity     Outcome Measure Help from another person eating meals?: None Help from another person taking care of personal grooming?: A Little Help from another person toileting, which includes using toliet, bedpan, or urinal?: A Lot Help from another person bathing (including washing, rinsing, drying)?: A Little Help from another person to put on and taking off regular upper body clothing?: A Little Help from another person to put on and taking off regular lower body clothing?: A Lot 6 Click Score: 17   End of Session    Activity Tolerance: Patient limited by fatigue Patient left: in chair;with call bell/phone within reach;with chair alarm set  OT Visit Diagnosis: Muscle weakness (generalized) (M62.81);Unsteadiness on feet (R26.81)                Time: 6333-5456 OT Time Calculation (min): 39 min Charges:  OT General Charges $OT Visit: 1 Visit OT Evaluation $OT Eval Low Complexity: [place], OTR/L Acute Rehabilitation Services [phone] [facility] pager [phone] office 08/26/2018  [place] 08/26/2018, 1:16 PM

## 2018-08-26 NOTE — Progress Notes (Signed)
Subjective: The patient is sitting up in bed. She appears anxious. She states that her anxiety makes her breathing worse and that her breathing problems make her anxiety worse. She does have prn xanax available, but she states that it doesn't help for long enough.  Objective: Vital signs in last 24 hours: Temp:  [96.8 F (36 C)-98.1 F (36.7 C)] 97.7 F (36.5 C) (03/18 1200) Pulse Rate:  [72-99] 86 (03/18 1445) Resp:  [3-27] 27 (03/18 1445) BP: (92-169)/(60-85) 153/84 (03/18 1445) SpO2:  [91 %-99 %] 97 % (03/18 1445) Weight change:  Last BM Date: 08/15/18  Intake/Output from previous day: 03/17 0701 - 03/18 0700 In: 240 [P.O.:240] Out: 1100 [Urine:1100] Intake/Output this shift: Total I/O In: -  Out: 450 [Urine:450]  General appearance: alert, cooperative, mild distress and anxious Lungs: Diminished breath sound bilaterally. No rales or rhonchi auscultated. Mild wheezes throughout. Positve for increased work of breathing.  Heart: regular rate and rhythm, S1, S2 normal, no murmur, click, rub or gallop Abdomen: Soft, non-tender, non-distended. Normoactive bowel sounds. No organomegaly, masses, or hernias are appreciated. Extremities: extremities normal, atraumatic, no cyanosis or edema Skin: Skin color, texture, turgor normal. No rashes or lesions Neurologic: Grossly normal   Lab Results: Recent Labs    08/25/18 0649  WBC 15.5*  HGB 13.2  HCT 42.0  PLT 228   BMET Recent Labs    08/25/18 0649  CREATININE 0.47    Studies/Results: No results found.  Medications: I have reviewed the patient's current medications.  Assessment/Plan: Problem  Debility  Copd With Acute Exacerbation (Hcc)  Copd (Chronic Obstructive Pulmonary Disease) (Hcc)  Chronic Respiratory Failure With Hypoxia (Hcc)  Acute on chronic respiratory failure with hypoxia (HCC)  Anxiety    Acute on chronic hypoxic respiratory failure: Oxygen requirements at baseline, but patient has not gotten out  of bed yet. Consult PT/OT and determine patient's oxygen requirements with activity and the correct disposition for patient. Morphine available for dyspnea.   COPD exacerbation: The patient is receiving nebulizer treatments, Brovana, pulmicort, mucinex, and singulair. She is also on IV solumedrol. Will taper steroids.  Nasal congestion: Nasal saline and humidified O2.  Anxiety: Low dose Buspar added to patient's prn xanax.   Debility: Patient states that she thinks that she is too weak to stand up now. PT/OT has been consulted.  Hypertension: Fair control on prn hydralazine.  I have seen and examined this patient myself. I have spent 35 minutes in her evaluation and care.  DVT prophylaxis: Lovenox subQ Code Status: No CPR, no Cardioversion Family Communication: Pt in room, family not at bedside Disposition Plan: Uncertain at this time    LOS: 15 days   Milani Lowenstein 08/26/2018, 3:32 PM

## 2018-08-27 DIAGNOSIS — Z66 Do not resuscitate: Secondary | ICD-10-CM

## 2018-08-27 DIAGNOSIS — F419 Anxiety disorder, unspecified: Secondary | ICD-10-CM

## 2018-08-27 DIAGNOSIS — Z515 Encounter for palliative care: Secondary | ICD-10-CM

## 2018-08-27 MED ORDER — BUSPIRONE HCL 5 MG PO TABS
7.5000 mg | ORAL_TABLET | Freq: Three times a day (TID) | ORAL | Status: DC
  Administered 2018-08-27 – 2018-09-03 (×22): 7.5 mg via ORAL
  Filled 2018-08-27 (×22): qty 2

## 2018-08-27 NOTE — Progress Notes (Addendum)
Subjective: The patient is sitting up in bed. She continues to complain of anxiety and its effect on her respiratory status. She was started on buspar 5 mg bid yesterday. The patient states that she doesn't notice any difference.  Objective: Vital signs in last 24 hours: Temp:  [97.4 F (36.3 C)-98.1 F (36.7 C)] 97.6 F (36.4 C) (03/19 1200) Pulse Rate:  [74-112] 86 (03/19 0842) Resp:  [13-32] 17 (03/19 0842) BP: (95-167)/(67-90) 138/67 (03/19 0800) SpO2:  [89 %-100 %] 97 % (03/19 0842) FiO2 (%):  [25 %] 25 % (03/19 0556) Weight change:  Last BM Date: 08/27/18  Intake/Output from previous day: 03/18 0701 - 03/19 0700 In: 480 [P.O.:480] Out: 550 [Urine:550] Intake/Output this shift: No intake/output data recorded.  General appearance: alert, cooperative, mild distress and anxious Lungs: No increased work of breathing. Mild wheezes throughout. No rales or rhonchi. Diminished breath sounds bilaterally. Heart: regular rate and rhythm, S1, S2 normal, no murmur, click, rub or gallop Abdomen: Soft, non-tender, non-distended. Normoactive bowel sounds. No organomegaly, masses, or hernias are appreciated. Extremities: extremities normal, atraumatic, no cyanosis or edema Skin: Skin color, texture, turgor normal. No rashes or lesions Neurologic: Grossly normal  Lab Results: Recent Labs    08/25/18 0649  WBC 15.5*  HGB 13.2  HCT 42.0  PLT 228   BMET Recent Labs    08/25/18 0649  CREATININE 0.47    Studies/Results: No results found.  Medications: I have reviewed the patient's current medications.  Assessment/Plan: No problems updated.  Acute on chronic hypoxic respiratory failure: Oxygen requirements at baseline, but patient has not gotten out of bed yet. Consult PT/OT and determine patient's oxygen requirements with activity and the correct disposition for patient. Morphine available for dyspnea.   COPD exacerbation: The patient is receiving nebulizer treatments,  Brovana, pulmicort, mucinex, and singulair. She is also on IV solumedrol. Will taper steroids.  Nasal congestion: Nasal saline and humidified O2.  Anxiety: I have increased the dose and frequency of buspar.  Debility: Patient states that she thinks that she is too weak to stand up now. PT/OT has evaluated the patient. They have recommended home health PT/OT with 24/7 supervision.   Hypertension: Fair control on prn hydralazine.  I have seen and examined this patient myself. I have spent 30 minutes in her evaluation and care.  DVT prophylaxis: Lovenox subQ Code Status: No CPR, no Cardioversion Family Communication: Pt in room, family not at bedside Disposition Plan: Uncertain at this time    LOS: 16 days   Courtney Ayers 08/27/2018, 12:29 PM

## 2018-08-27 NOTE — Progress Notes (Signed)
Pt placed on overnight pulse OX for study.  Pt currently on 1.5L [place] and tolerating well at this time.  RT to monitor and assess as needed.

## 2018-08-27 NOTE — Progress Notes (Signed)
Pt requesting Venti mask for comfort.  Pt placed on 31% VM and tolerating well at this time, RT to monitor and assess as needed.

## 2018-08-27 NOTE — Progress Notes (Signed)
Occupational Therapy Treatment Patient Details Name: Courtney Ayers MRN: [id number] DOB: [birth year] Today's Date: 08/27/2018    History of present illness pt was admitted for acute COPD exacerbation.  PMH:  OA and depression   OT comments  Pt did not want to get OOB until after her lunch. She has been performing AROM exercises; issued level one theraband and reviewed with her, emphasizing taking rest breaks. Pt used to work out at a gym and was really interested in this.  Follow Up Recommendations  SNF    Equipment Recommendations  3 in 1 bedside commode    Recommendations for Other Services      Precautions / Restrictions Precautions Precautions: Fall Precaution Comments: watch 02 Restrictions Weight Bearing Restrictions: No       Mobility Bed Mobility                  Transfers                      Balance                                           ADL either performed or assessed with clinical judgement   ADL                                         General ADL Comments: pt did not want to get OOB; agreeable later after lunch. Pt has been performing AROM exercises to U[facility];  provided level one theraband and handout     Vision       Perception     Praxis      Cognition Arousal/Alertness: Awake/alert Behavior During Therapy: WFL for tasks assessed/performed Overall Cognitive Status: Within Functional Limits for tasks assessed                                          Exercises Exercises: Other exercises Other Exercises Other Exercises: issued level one theraband for biceps, anterior delts, and pects, one arm at a time with rest breaks.  Pt performed one set of 10-12 for each exercise.  Handout provided   Shoulder Instructions       General Comments  sats remained in 90s    Pertinent Vitals/ Pain       Pain Assessment: No/denies pain  Home Living                                          Prior Functioning/Environment              Frequency  Min 2X/week        Progress Toward Goals  OT Goals(current goals can now be found in the care plan section)  Progress towards OT goals: Progressing toward goals     Plan Discharge plan needs to be updated    Co-evaluation                 AM-PAC OT "6 Clicks" Daily Activity     Outcome Measure   Help from another person eating meals?: None Help  from another person taking care of personal grooming?: A Little Help from another person toileting, which includes using toliet, bedpan, or urinal?: A Lot Help from another person bathing (including washing, rinsing, drying)?: A Little Help from another person to put on and taking off regular upper body clothing?: A Little Help from another person to put on and taking off regular lower body clothing?: A Lot 6 Click Score: 17    End of Session        Activity Tolerance Patient tolerated treatment well   Patient Left in chair;with call bell/phone within reach;with chair alarm set   Nurse Communication          Time: 2500-3704 OT Time Calculation (min): 23 min  Charges: OT General Charges $OT Visit: 1 Visit OT Treatments $Therapeutic Exercise: 8-22 mins  Lesle Chris, OTR/L Acute Rehabilitation Services [phone] [facility] pager [phone] office 08/27/2018   [place] 08/27/2018, 3:31 PM

## 2018-08-28 LAB — CBC WITH DIFFERENTIAL/PLATELET
Abs Immature Granulocytes: 0.27 10*3/uL — ABNORMAL HIGH (ref 0.00–0.07)
Basophils Absolute: 0 10*3/uL (ref 0.0–0.1)
Basophils Relative: 0 %
Eosinophils Absolute: 0 10*3/uL (ref 0.0–0.5)
Eosinophils Relative: 0 %
HCT: 43.9 % (ref 36.0–46.0)
Hemoglobin: 13.4 g/dL (ref 12.0–15.0)
Immature Granulocytes: 2 %
Lymphocytes Relative: 2 %
Lymphs Abs: 0.4 10*3/uL — ABNORMAL LOW (ref 0.7–4.0)
MCH: 30.7 pg (ref 26.0–34.0)
MCHC: 30.5 g/dL (ref 30.0–36.0)
MCV: 100.5 fL — ABNORMAL HIGH (ref 80.0–100.0)
Monocytes Absolute: 0.3 10*3/uL (ref 0.1–1.0)
Monocytes Relative: 2 %
Neutro Abs: 14.6 10*3/uL — ABNORMAL HIGH (ref 1.7–7.7)
Neutrophils Relative %: 94 %
PLATELETS: 172 10*3/uL (ref 150–400)
RBC: 4.37 MIL/uL (ref 3.87–5.11)
RDW: 13 % (ref 11.5–15.5)
WBC: 15.5 10*3/uL — ABNORMAL HIGH (ref 4.0–10.5)
nRBC: 0 % (ref 0.0–0.2)

## 2018-08-28 LAB — BASIC METABOLIC PANEL
Anion gap: 7 (ref 5–15)
BUN: 24 mg/dL — ABNORMAL HIGH (ref 8–23)
CO2: 37 mmol/L — ABNORMAL HIGH (ref 22–32)
Calcium: 8.2 mg/dL — ABNORMAL LOW (ref 8.9–10.3)
Chloride: 95 mmol/L — ABNORMAL LOW (ref 98–111)
Creatinine, Ser: 0.49 mg/dL (ref 0.44–1.00)
GFR calc Af Amer: >60 mL/min (ref >60)
Glucose, Bld: 186 mg/dL — ABNORMAL HIGH (ref 70–99)
POTASSIUM: 4.2 mmol/L (ref 3.5–5.1)
Sodium: 139 mmol/L (ref 135–145)

## 2018-08-28 LAB — BLOOD GAS, ARTERIAL
Acid-Base Excess: 9 mmol/L — ABNORMAL HIGH (ref 0.0–2.0)
Bicarbonate: 36.2 mmol/L — ABNORMAL HIGH (ref 20.0–28.0)
Drawn by: 422461
O2 Content: 2 L/min
O2 Saturation: 94.7 %
Patient temperature: 98.6
pCO2 arterial: 64 mmHg — ABNORMAL HIGH (ref 32.0–48.0)
pH, Arterial: 7.371 (ref 7.350–7.450)
pO2, Arterial: 77.9 mmHg — ABNORMAL LOW (ref 83.0–108.0)

## 2018-08-28 MED ORDER — PREDNISONE 20 MG PO TABS
60.0000 mg | ORAL_TABLET | Freq: Every day | ORAL | Status: DC
  Administered 2018-08-29 – 2018-08-31 (×3): 60 mg via ORAL
  Filled 2018-08-28 (×3): qty 3

## 2018-08-28 MED ORDER — METHYLPREDNISOLONE SODIUM SUCC 125 MG IJ SOLR
60.0000 mg | Freq: Every day | INTRAMUSCULAR | Status: DC
  Administered 2018-08-28: 60 mg via INTRAVENOUS
  Filled 2018-08-28: qty 2

## 2018-08-28 MED ORDER — ORAL CARE MOUTH RINSE
15.0000 mL | Freq: Two times a day (BID) | OROMUCOSAL | Status: DC
  Administered 2018-08-28 – 2018-09-03 (×8): 15 mL via OROMUCOSAL

## 2018-08-28 NOTE — Progress Notes (Signed)
Physical Therapy Treatment Patient Details Name: Courtney Ayers MRN: [id number] DOB: June 08, 1946 Today's Date: 08/28/2018    History of Present Illness pt was admitted for acute COPD exacerbation.  PMH:  OA and depression    PT Comments    Assisted pt OOB to [facility] then to recliner with + 2 assist for safety and increased, increased time to allow rest breaks between to control dyspnea, decrease anxiety and control RR.  Very limited activity tolerance.  General transfer comment: + 2 assist for safety multiple lines/leads and balance instability transfering from elevated bed to [place] then to recliner.    Follow Up Recommendations  Home health PT;Supervision/Assistance - 24 hour     Equipment Recommendations       Recommendations for Other Services       Precautions / Restrictions Precautions Precautions: Fall Precaution Comments: monitor O2, high anxiety,  Restrictions Weight Bearing Restrictions: No    Mobility  Bed Mobility Overal bed mobility: Needs Assistance Bed Mobility: Supine to Sit     Supine to sit: Min assist;HOB elevated     General bed mobility comments: required increased, increased segmantal time to allow brief breaks between to minimize anxiety, dysnpea and control RR.   Transfers Overall transfer level: Needs assistance Equipment used: 1 person hand held assist Transfers: Sit to/from Stand Sit to Stand: Min assist;Mod assist;+2 physical assistance;+2 safety/equipment         General transfer comment: + 2 assist for safety multiple lines/leads and balance instability transfering from elevated bed to [facility] then to recliner.    Ambulation/Gait             General Gait Details: transfers only due to weakness, dyspnea and fatigue   Stairs             Wheelchair Mobility    Modified Rankin (Stroke Patients Only)       Balance                                            Cognition Arousal/Alertness:  Awake/alert Behavior During Therapy: WFL for tasks assessed/performed Overall Cognitive Status: Within Functional Limits for tasks assessed                                 General Comments: pleasant      Exercises      General Comments        Pertinent Vitals/Pain Pain Assessment: No/denies pain    Home Living                      Prior Function            PT Goals (current goals can now be found in the care plan section) Progress towards PT goals: Progressing toward goals    Frequency    Min 3X/week      PT Plan Current plan remains appropriate    Co-evaluation              AM-PAC PT "6 Clicks" Mobility   Outcome Measure  Help needed turning from your back to your side while in a flat bed without using bedrails?: A Little Help needed moving from lying on your back to sitting on the side of a flat bed without using bedrails?: A Little Help needed moving to  and from a bed to a chair (including a wheelchair)?: A Lot Help needed standing up from a chair using your arms (e.g., wheelchair or bedside chair)?: A Lot Help needed to walk in hospital room?: A Lot Help needed climbing 3-5 steps with a railing? : A Lot 6 Click Score: 14    End of Session Equipment Utilized During Treatment: Gait belt Activity Tolerance: Treatment limited secondary to medical complications (Comment) Patient left: in chair;with call bell/phone within reach;with chair alarm set Nurse Communication: Mobility status PT Visit Diagnosis: Unsteadiness on feet (R26.81)     Time: 1135-1200 PT Time Calculation (min) (ACUTE ONLY): 25 min  Charges:  $Therapeutic Activity: 23-37 mins                     Rica Koyanagi  PTA Acute  Rehabilitation Services Pager      [phone] Office      [phone]

## 2018-08-28 NOTE — Progress Notes (Signed)
Subjective: The patient is resting comfortably in bed. Overnight pulse oximetry remained in the 90's. The patient asked for a venti mask part-way through the night she states because she felt more comfortable. She did not require BiPAP. It is doubtful, even to the patient, that venti mask was necessary either.   Objective: Vital signs in last 24 hours: Temp:  [97 F (36.1 C)-98.2 F (36.8 C)] 97.8 F (36.6 C) (03/20 0800) Pulse Rate:  [68-103] 101 (03/20 1114) Resp:  [14-31] 21 (03/20 1114) BP: (130-151)/(61-84) 150/79 (03/20 1114) SpO2:  [91 %-100 %] 91 % (03/20 1114) FiO2 (%):  [31 %] 31 % (03/20 0500) Weight change:  Last BM Date: 08/27/18  Intake/Output from previous day: 03/19 0701 - 03/20 0700 In: 320 [P.O.:320] Out: 300 [Urine:300] Intake/Output this shift: No intake/output data recorded.  General appearance: alert, cooperative, mild distress and anxious Lungs: No increased work of breathing. Mild wheezes throughout. No rales or rhonchi. Diminished breath sounds bilaterally. Heart: regular rate and rhythm, S1, S2 normal, no murmur, click, rub or gallop Abdomen: Soft, non-tender, non-distended. Normoactive bowel sounds. No organomegaly, masses, or hernias are appreciated. Extremities: extremities normal, atraumatic, no cyanosis or edema Skin: Skin color, texture, turgor normal. No rashes or lesions Neurologic: Grossly normal  Lab Results: Recent Labs    08/28/18 0405  WBC 15.5*  HGB 13.4  HCT 43.9  PLT 172   BMET Recent Labs    08/28/18 0405  NA 139  K 4.2  CL 95*  CO2 37*  GLUCOSE 186*  BUN 24*  CREATININE 0.49  CALCIUM 8.2*    Studies/Results: No results found.  Medications: I have reviewed the patient's current medications.  Assessment/Plan: No problems updated.  Acute on chronic hypoxic respiratory failure: Oxygen requirements at baseline, but patient has not gotten out of bed yet. Morphine available for dyspnea. Will dc to home tomorrow after  her O2 requirements will be for ambulation at home.  COPD exacerbation: The patient is receiving nebulizer treatments, Brovana, pulmicort, mucinex, and singulair. Steroids are being tapered.  Nasal congestion: Nasal saline and humidified O2.  Anxiety: I have increased the dose and frequency of buspar.  Debility: Patient states that she thinks that she is too weak to stand up now. PT/OT has evaluated the patient. They have recommended home health PT/OT with 24/7 supervision.   Hypertension: Fair control on prn hydralazine.  I have seen and examined this patient myself. I have spent 32 minutes in her evaluation and care.  DVT prophylaxis: Lovenox subQ Code Status: No CPR, no Cardioversion Family Communication: Pt in room, family not at bedside Disposition Plan: Uncertain at this time    LOS: 17 days   Indiya Izquierdo 08/28/2018, 11:41 AM

## 2018-08-28 NOTE — Progress Notes (Signed)
This RN agrees with the previous assessment.  

## 2018-08-29 MED ORDER — PANTOPRAZOLE SODIUM 40 MG PO TBEC
40.0000 mg | DELAYED_RELEASE_TABLET | Freq: Every day | ORAL | Status: DC
  Administered 2018-08-29 – 2018-09-03 (×6): 40 mg via ORAL
  Filled 2018-08-29 (×6): qty 1

## 2018-08-29 MED ORDER — MAGIC MOUTHWASH W/LIDOCAINE
15.0000 mL | Freq: Four times a day (QID) | ORAL | Status: DC
  Administered 2018-08-29 – 2018-08-31 (×9): 15 mL via ORAL
  Filled 2018-08-29 (×13): qty 15

## 2018-08-29 MED ORDER — NYSTATIN 100000 UNIT/ML MT SUSP
5.0000 mL | Freq: Four times a day (QID) | OROMUCOSAL | Status: DC
  Administered 2018-08-29 – 2018-09-03 (×21): 500000 [IU] via ORAL
  Filled 2018-08-29 (×21): qty 5

## 2018-08-29 NOTE — Progress Notes (Signed)
PROGRESS NOTE    Courtney Ayers  ZOX:[id number] DOB: [birth year] DOA: 08/11/2018 PCP: [name], MD   Brief Narrative:  HPI per Dr. Ciro Backer is a 73 y.o. female with medical history significant of COPD, depression, allergic rhinitis, hypertension, who presented with significant shortness of breath and cough.  Symptoms started about a week ago and progressively getting worse.Patient follows up with pulmonology.  She has been on multiple pulmonary medications that seem to have worked for her until now.  She was feeling weak.  She took doxycycline also but get some stomach upset.  She felt congested with her sinuses and try to treat this symptomatically.  She continues to have shortness of breath.  She decided to come to the emergency room where she was having significant tachypnea with a temperature of 101.  No pneumonia on x-ray.  She was however having bilateral wheezing consistent with acute exacerbation of COPD.  Viral screen so far negative.  Patient has been admitted with acute exacerbation of COPD.[name]  ED Course: Temperature 101.1 blood pressure 189/82, pulse of 116, respirate 39 oxygen sat 92% on 2 L.  Patient CBC and chemistry appeared to be within normal except for troponin 0 0.05 glucose 123.  Chest x-ray showed no acute findings.  Influenza AMB negative.  Patient being admitted with acute exacerbation COPD.  Assessment & Plan:   Principal Problem:   Acute on chronic respiratory failure with hypoxia (HCC) Active Problems:   Anxiety   Shortness of breath   COPD exacerbation (HCC)   COPD with acute exacerbation (HCC)   HTN (hypertension)   Tachypnea   Debility   Palliative care by specialist   DNR (do not resuscitate)  #1 acute on chronic hypoxic respiratory failure secondary to acute COPD exacerbation Patient improving clinically since admission and likely close to baseline.  O2 requirements at baseline.  Patient states she is significantly weak with  inability to stand up on her own.  Patient status post full course of antibiotic treatment.  Continue Pulmicort, Brovana, Mucinex, duo nebs, Atrovent nasal spray, steroid taper, Protonix, Singulair.  Will need outpatient follow-up with pulmonary.  2.  Depression/anxiety Continue Celexa.  Continue BuSpar at 7.5 mg 3 times daily.  Xanax as needed.  Outpatient follow-up.  3.  Hypertension Stable.  Hydralazine as needed.  4.  Oral thrush Patient with complaints of mouth pain.  Patient noted to have some whitish exudate on the tip of the tongue.  Patient on prednisone slow taper.  Placed on Magic mouthwash with lidocaine and nystatin swish and swallow.  5.  Debility Patient states she is significantly weak with inability to stand up on her own.  PT/OT.  Patient does not feel she will be able to manage at home.  Social work consult for skilled nursing facility placement.  Follow.   DVT prophylaxis: Lovenox Code Status: Partial Family Communication: Updated patient.  No family at bedside. Disposition Plan: Skilled nursing facility.   Consultants:   Palliative care: Dr. Domingo Cocking 08/19/2018  Pulmonary: Dr. Lake Bells 08/12/2018  Procedures:   Chest x-ray 08/11/2018, 08/14/2018  Antimicrobials:   Azithromycin 08/11/2018 1 dose.  IV Rocephin 08/11/2018--08/15/2018  Azithromycin 08/14/2018>>>> 08/18/2018   Subjective: Patient with complaints of mouth pain.  Denies any chest pain.  Some improvement with shortness of breath.  Complaining of significant weakness stating that she could barely stand up on her own.  Objective: Vitals:   08/29/18 0820 08/29/18 0827 08/29/18 0833 08/29/18 1100  BP:  Pulse:      Resp:      Temp:      TempSrc:      SpO2: 97% 97% 97% 93%  Weight:      Height:        Intake/Output Summary (Last 24 hours) at 08/29/2018 1220 Last data filed at 08/29/2018 0526 Gross per 24 hour  Intake 180 ml  Output 350 ml  Net -170 ml   Filed Weights   08/11/18 1711 08/14/18  0918  Weight: 62.6 kg 63.6 kg    Examination:  General exam: Appears calm and comfortable Oropharynx: Some patches of thrush noted on the tip of the tongue. Respiratory system: Poor to fair air movement.  No wheezing noted.  No crackles.  No rhonchi.  Speaking in full sentences.  No use of accessory muscles of respiration.  Cardiovascular system: S1 & S2 heard, RRR. No JVD, murmurs, rubs, gallops or clicks. No pedal edema. Gastrointestinal system: Abdomen is nondistended, soft and nontender. No organomegaly or masses felt. Normal bowel sounds heard. Central nervous system: Alert and oriented. No focal neurological deficits. Extremities: Symmetric 5 x 5 power. Skin: No rashes, lesions or ulcers Psychiatry: Judgement and insight appear normal. Mood & affect appropriate.     Data Reviewed: I have personally reviewed following labs and imaging studies  CBC: Recent Labs  Lab 08/25/18 0649 08/28/18 0405  WBC 15.5* 15.5*  NEUTROABS  --  14.6*  HGB 13.2 13.4  HCT 42.0 43.9  MCV 99.1 100.5*  PLT 228 062   Basic Metabolic Panel: Recent Labs  Lab 08/25/18 0649 08/28/18 0405  NA  --  139  K  --  4.2  CL  --  95*  CO2  --  37*  GLUCOSE  --  186*  BUN  --  24*  CREATININE 0.47 0.49  CALCIUM  --  8.2*   GFR: Estimated Creatinine Clearance: 60.9 mL/min (by C-G formula based on SCr of 0.49 mg/dL). Liver Function Tests: No results for input(s): AST, ALT, ALKPHOS, BILITOT, PROT, ALBUMIN in the last 168 hours. No results for input(s): LIPASE, AMYLASE in the last 168 hours. No results for input(s): AMMONIA in the last 168 hours. Coagulation Profile: No results for input(s): INR, PROTIME in the last 168 hours. Cardiac Enzymes: No results for input(s): CKTOTAL, CKMB, CKMBINDEX, TROPONINI in the last 168 hours. BNP (last 3 results) No results for input(s): PROBNP in the last 8760 hours. HbA1C: No results for input(s): HGBA1C in the last 72 hours. CBG: No results for input(s):  GLUCAP in the last 168 hours. Lipid Profile: No results for input(s): CHOL, HDL, LDLCALC, TRIG, CHOLHDL, LDLDIRECT in the last 72 hours. Thyroid Function Tests: No results for input(s): TSH, T4TOTAL, FREET4, T3FREE, THYROIDAB in the last 72 hours. Anemia Panel: No results for input(s): VITAMINB12, FOLATE, FERRITIN, TIBC, IRON, RETICCTPCT in the last 72 hours. Sepsis Labs: No results for input(s): PROCALCITON, LATICACIDVEN in the last 168 hours.  No results found for this or any previous visit (from the past 240 hour(s)).       Radiology Studies: No results found.      Scheduled Meds: . arformoterol  15 mcg Nebulization BID  . budesonide (PULMICORT) nebulizer solution  0.5 mg Nebulization BID  . busPIRone  7.5 mg Oral TID  . calcium-vitamin D  1 tablet Oral Daily  . Chlorhexidine Gluconate Cloth  6 each Topical Daily  . citalopram  10 mg Oral QHS  . enoxaparin (LOVENOX) injection  40 mg  Subcutaneous QHS  . guaiFENesin  1,200 mg Oral BID  . ipratropium  2 spray Each Nare QID  . ipratropium-albuterol  3 mL Nebulization TID  . mouth rinse  15 mL Mouth Rinse BID  . montelukast  10 mg Oral QHS  . pantoprazole  40 mg Oral Q0600  . predniSONE  60 mg Oral Q breakfast   Continuous Infusions: . sodium chloride Stopped (08/23/18 1758)     LOS: 18 days    Time spent: 40 minutes    Irine Seal, MD [facility]  If 7PM-7AM, please contact night-coverage [email or web address] 08/29/2018, 12:20 PM

## 2018-08-29 NOTE — Progress Notes (Signed)
Patient was unable to stand with 2 person assist. Sat on the edge of the bed for 90 minutes at suppertime. Will pass on to night shift to try again tonight

## 2018-08-30 LAB — BASIC METABOLIC PANEL
Anion gap: 8 (ref 5–15)
BUN: 23 mg/dL (ref 8–23)
CO2: 39 mmol/L — ABNORMAL HIGH (ref 22–32)
Calcium: 8 mg/dL — ABNORMAL LOW (ref 8.9–10.3)
Chloride: 93 mmol/L — ABNORMAL LOW (ref 98–111)
Creatinine, Ser: 0.51 mg/dL (ref 0.44–1.00)
GFR calc Af Amer: >60 mL/min (ref >60)
Glucose, Bld: 92 mg/dL (ref 70–99)
Potassium: 3.2 mmol/L — ABNORMAL LOW (ref 3.5–5.1)
Sodium: 140 mmol/L (ref 135–145)

## 2018-08-30 MED ORDER — BISACODYL 10 MG RE SUPP
10.0000 mg | Freq: Once | RECTAL | Status: AC
  Administered 2018-08-30: 10 mg via RECTAL
  Filled 2018-08-30: qty 1

## 2018-08-30 MED ORDER — POTASSIUM CHLORIDE CRYS ER 20 MEQ PO TBCR
40.0000 meq | EXTENDED_RELEASE_TABLET | Freq: Once | ORAL | Status: AC
  Administered 2018-08-30: 40 meq via ORAL
  Filled 2018-08-30: qty 2

## 2018-08-30 NOTE — Progress Notes (Signed)
Patient refused to get off bedpan earlier after being on bedpan for an hour. Patient was encouraged to get off, but anxiously reported that she needed to stay on to have a BM.  MD was notified for orders for constipation. Suppository was given to patient.Roderick Pee

## 2018-08-30 NOTE — Progress Notes (Signed)
Patient reported this shift that she will be unable to stand with assistance in order to perform ambulatory HR/ 02 check.  Will continue to encourage patient to ambulate in order to obtain.Roderick Pee

## 2018-08-30 NOTE — Progress Notes (Addendum)
Patient was taken off bedpan and given suppository.  Pt. Again was adamant that she wanted to get on bedpan and sit for bm, despite bottom being red from pan.  Patient will be given less than 10 more minutes to try to have BM on bedpan.Courtney Ayers

## 2018-08-30 NOTE — Progress Notes (Addendum)
PROGRESS NOTE    Courtney Ayers  WEX:[id number] DOB: 08-Jun-1946 DOA: 08/11/2018 PCP: [name], MD   Brief Narrative:  HPI per Dr. Ciro Backer is a 73 y.o. female with medical history significant of COPD, depression, allergic rhinitis, hypertension, who presented with significant shortness of breath and cough.  Symptoms started about a week ago and progressively getting worse.Patient follows up with pulmonology.  She has been on multiple pulmonary medications that seem to have worked for her until now.  She was feeling weak.  She took doxycycline also but get some stomach upset.  She felt congested with her sinuses and try to treat this symptomatically.  She continues to have shortness of breath.  She decided to come to the emergency room where she was having significant tachypnea with a temperature of 101.  No pneumonia on x-ray.  She was however having bilateral wheezing consistent with acute exacerbation of COPD.  Viral screen so far negative.  Patient has been admitted with acute exacerbation of COPD.[name]  ED Course: Temperature 101.1 blood pressure 189/82, pulse of 116, respirate 39 oxygen sat 92% on 2 L.  Patient CBC and chemistry appeared to be within normal except for troponin 0 0.05 glucose 123.  Chest x-ray showed no acute findings.  Influenza AMB negative.  Patient being admitted with acute exacerbation COPD.  Assessment & Plan:   Principal Problem:   Acute on chronic respiratory failure with hypoxia and hypercapnia (HCC) Active Problems:   Anxiety   Shortness of breath   COPD exacerbation (HCC)   COPD with acute exacerbation (HCC)   HTN (hypertension)   Tachypnea   Debility   Palliative care by specialist   DNR (do not resuscitate)  #1 acute on chronic hypoxic respiratory failure secondary to acute COPD exacerbation Patient improving clinically since admission and likely close to baseline.  O2 requirements at baseline.  Patient states she is significantly  weak with inability to stand up on her own.  Patient status post full course of antibiotic treatment.  Continue Pulmicort, Brovana, Mucinex, duo nebs, Atrovent nasal spray, steroid taper, Protonix, Singulair.  Will need outpatient follow-up with pulmonary.  2.  Depression/anxiety Continue Celexa.  Continue BuSpar at 7.5 mg 3 times daily.  Xanax as needed.  Outpatient follow-up.  3.  Hypertension Stable.  Hydralazine as needed.  4.  Oral thrush Patient with complaints of mouth pain yesterday and noted to have some whitish exudates on the tip of the tongue.  Patient on a slow prednisone taper.  Some clinical improvement on Magic mouthwash with lidocaine and nystatin swish and swallow.  5.  Debility Patient states she is significantly weak with inability to stand up on her own.  PT/OT.  Patient does not feel she will be able to manage at home.  Social work consult for skilled nursing facility placement.  Follow.  6.  Hypokalemia Replete.   DVT prophylaxis: Lovenox Code Status: Partial Family Communication: Updated patient.  No family at bedside. Disposition Plan: Skilled nursing facility.   Consultants:   Palliative care: Dr. Domingo Cocking 08/19/2018  Pulmonary: Dr. Lake Bells 08/12/2018  Procedures:   Chest x-ray 08/11/2018, 08/14/2018  Antimicrobials:   Azithromycin 08/11/2018 1 dose.  IV Rocephin 08/11/2018--08/15/2018  Azithromycin 08/14/2018>>>> 08/18/2018   Subjective: Patient states some improvement with mouth pain.  Denies any chest pain.  Shortness of breath close to baseline.  Still with significant weakness.  Objective: Vitals:   08/30/18 0541 08/30/18 0734 08/30/18 0741 08/30/18 0746  BP:  113/67     Pulse: 79     Resp: 17     Temp: 97.7 F (36.5 C)     TempSrc: Oral     SpO2: 97% 97% 97% 97%  Weight:      Height:        Intake/Output Summary (Last 24 hours) at 08/30/2018 1130 Last data filed at 08/30/2018 0800 Gross per 24 hour  Intake 240 ml  Output 500 ml  Net -260  ml   Filed Weights   08/11/18 1711 08/14/18 0918  Weight: 62.6 kg 63.6 kg    Examination:  General exam: NAD Oropharynx: Some patches of thrush noted on the tip of the tongue. Respiratory system: Poor to fair air movement.  No wheezing noted.  No rhonchi.  Speaking in full sentences.  No use of accessory muscles of respiration.  Cardiovascular system: Regular rate rhythm no murmurs rubs or gallops.  No JVD.  No lower extremity edema.  Gastrointestinal system: Abdomen is soft, nontender, nondistended, positive bowel sounds.  No rebound.  No guarding.  Central nervous system: Alert and oriented. No focal neurological deficits. Extremities: Symmetric 5 x 5 power. Skin: No rashes, lesions or ulcers Psychiatry: Judgement and insight appear normal. Mood & affect appropriate.     Data Reviewed: I have personally reviewed following labs and imaging studies  CBC: Recent Labs  Lab 08/25/18 0649 08/28/18 0405  WBC 15.5* 15.5*  NEUTROABS  --  14.6*  HGB 13.2 13.4  HCT 42.0 43.9  MCV 99.1 100.5*  PLT 228 643   Basic Metabolic Panel: Recent Labs  Lab 08/25/18 0649 08/28/18 0405 08/30/18 0528  NA  --  139 140  K  --  4.2 3.2*  CL  --  95* 93*  CO2  --  37* 39*  GLUCOSE  --  186* 92  BUN  --  24* 23  CREATININE 0.47 0.49 0.51  CALCIUM  --  8.2* 8.0*   GFR: Estimated Creatinine Clearance: 60.9 mL/min (by C-G formula based on SCr of 0.51 mg/dL). Liver Function Tests: No results for input(s): AST, ALT, ALKPHOS, BILITOT, PROT, ALBUMIN in the last 168 hours. No results for input(s): LIPASE, AMYLASE in the last 168 hours. No results for input(s): AMMONIA in the last 168 hours. Coagulation Profile: No results for input(s): INR, PROTIME in the last 168 hours. Cardiac Enzymes: No results for input(s): CKTOTAL, CKMB, CKMBINDEX, TROPONINI in the last 168 hours. BNP (last 3 results) No results for input(s): PROBNP in the last 8760 hours. HbA1C: No results for input(s): HGBA1C in  the last 72 hours. CBG: No results for input(s): GLUCAP in the last 168 hours. Lipid Profile: No results for input(s): CHOL, HDL, LDLCALC, TRIG, CHOLHDL, LDLDIRECT in the last 72 hours. Thyroid Function Tests: No results for input(s): TSH, T4TOTAL, FREET4, T3FREE, THYROIDAB in the last 72 hours. Anemia Panel: No results for input(s): VITAMINB12, FOLATE, FERRITIN, TIBC, IRON, RETICCTPCT in the last 72 hours. Sepsis Labs: No results for input(s): PROCALCITON, LATICACIDVEN in the last 168 hours.  No results found for this or any previous visit (from the past 240 hour(s)).       Radiology Studies: No results found.      Scheduled Meds: . arformoterol  15 mcg Nebulization BID  . budesonide (PULMICORT) nebulizer solution  0.5 mg Nebulization BID  . busPIRone  7.5 mg Oral TID  . calcium-vitamin D  1 tablet Oral Daily  . Chlorhexidine Gluconate Cloth  6 each Topical Daily  .  citalopram  10 mg Oral QHS  . enoxaparin (LOVENOX) injection  40 mg Subcutaneous QHS  . guaiFENesin  1,200 mg Oral BID  . ipratropium  2 spray Each Nare QID  . ipratropium-albuterol  3 mL Nebulization TID  . magic mouthwash w/lidocaine  15 mL Oral QID  . mouth rinse  15 mL Mouth Rinse BID  . montelukast  10 mg Oral QHS  . nystatin  5 mL Oral QID  . pantoprazole  40 mg Oral Q0600  . predniSONE  60 mg Oral Q breakfast   Continuous Infusions: . sodium chloride Stopped (08/23/18 1758)     LOS: 19 days    Time spent: 40 minutes    Irine Seal, MD [facility]  If 7PM-7AM, please contact night-coverage [email or web address] 08/30/2018, 11:30 AM

## 2018-08-31 DIAGNOSIS — R5381 Other malaise: Secondary | ICD-10-CM

## 2018-08-31 DIAGNOSIS — K5900 Constipation, unspecified: Secondary | ICD-10-CM | POA: Clinically undetermined

## 2018-08-31 LAB — BASIC METABOLIC PANEL
Anion gap: 8 (ref 5–15)
BUN: 19 mg/dL (ref 8–23)
CO2: 36 mmol/L — AB (ref 22–32)
Calcium: 7.9 mg/dL — ABNORMAL LOW (ref 8.9–10.3)
Chloride: 94 mmol/L — ABNORMAL LOW (ref 98–111)
Creatinine, Ser: 0.47 mg/dL (ref 0.44–1.00)
GFR calc Af Amer: >60 mL/min (ref >60)
GFR calc non Af Amer: >60 mL/min (ref >60)
Glucose, Bld: 172 mg/dL — ABNORMAL HIGH (ref 70–99)
Potassium: 3.6 mmol/L (ref 3.5–5.1)
Sodium: 138 mmol/L (ref 135–145)

## 2018-08-31 MED ORDER — SENNOSIDES-DOCUSATE SODIUM 8.6-50 MG PO TABS
1.0000 | ORAL_TABLET | Freq: Two times a day (BID) | ORAL | Status: DC
  Administered 2018-08-31: 1 via ORAL
  Filled 2018-08-31 (×6): qty 1

## 2018-08-31 MED ORDER — POLYETHYLENE GLYCOL 3350 17 G PO PACK
17.0000 g | PACK | Freq: Two times a day (BID) | ORAL | Status: DC
  Filled 2018-08-31 (×6): qty 1

## 2018-08-31 MED ORDER — VALACYCLOVIR HCL 500 MG PO TABS
1000.0000 mg | ORAL_TABLET | Freq: Three times a day (TID) | ORAL | Status: DC
  Administered 2018-08-31 – 2018-09-03 (×10): 1000 mg via ORAL
  Filled 2018-08-31 (×13): qty 2

## 2018-08-31 NOTE — Progress Notes (Signed)
PROGRESS NOTE    Courtney Ayers  MBT:[id number] DOB: 1945-10-29 DOA: 08/11/2018 PCP: [name], MD   Brief Narrative:  HPI per Dr. Ciro Backer is a 73 y.o. female with medical history significant of COPD, depression, allergic rhinitis, hypertension, who presented with significant shortness of breath and cough.  Symptoms started about a week ago and progressively getting worse.Patient follows up with pulmonology.  She has been on multiple pulmonary medications that seem to have worked for her until now.  She was feeling weak.  She took doxycycline also but get some stomach upset.  She felt congested with her sinuses and try to treat this symptomatically.  She continues to have shortness of breath.  She decided to come to the emergency room where she was having significant tachypnea with a temperature of 101.  No pneumonia on x-ray.  She was however having bilateral wheezing consistent with acute exacerbation of COPD.  Viral screen so far negative.  Patient has been admitted with acute exacerbation of COPD.[name]  ED Course: Temperature 101.1 blood pressure 189/82, pulse of 116, respirate 39 oxygen sat 92% on 2 L.  Patient CBC and chemistry appeared to be within normal except for troponin 0 0.05 glucose 123.  Chest x-ray showed no acute findings.  Influenza AMB negative.  Patient being admitted with acute exacerbation COPD.  Assessment & Plan:   Principal Problem:   Acute on chronic respiratory failure with hypoxia and hypercapnia (HCC) Active Problems:   Anxiety   Shortness of breath   COPD exacerbation (HCC)   COPD with acute exacerbation (HCC)   HTN (hypertension)   Tachypnea   Debility   Palliative care by specialist   DNR (do not resuscitate)   Constipation  #1 acute on chronic hypoxic respiratory failure secondary to acute COPD exacerbation Patient improving clinically since admission and likely close to baseline.  O2 requirements at baseline.  Patient states she is  significantly weak with inability to stand up on her own.  Patient status post full course of antibiotic treatment.  Continue Pulmicort, Brovana, Mucinex, duo nebs, Atrovent nasal spray, steroid taper, Protonix, Singulair.  Will need outpatient follow-up with pulmonary.  2.  Depression/anxiety Continue Celexa.  Continue BuSpar at 7.5 mg 3 times daily.  Xanax as needed.  Outpatient follow-up.  3.  Hypertension Stable.  Hydralazine as needed.  4.  Oral thrush/cold sores Patient with complaints of mouth pain and noted to have some whitish exudates on the tip of the tongue.  Patient on a slow prednisone taper.  Some clinical improvement on Magic mouthwash with lidocaine and nystatin swish and swallow.  Patient with cold sores on the lip.  Place on Valtrex 1000 mg 3 times daily x7 days.  5.  Debility Patient states she is significantly weak with inability to stand up on her own.  PT/OT.  Patient does not feel she will be able to manage at home.  Social work consult for skilled nursing facility placement.  Follow.  6.  Hypokalemia Repleted.   DVT prophylaxis: Lovenox Code Status: Partial Family Communication: Updated patient.  No family at bedside. Disposition Plan: Skilled nursing facility.   Consultants:   Palliative care: Dr. Domingo Cocking 08/19/2018  Pulmonary: Dr. Lake Bells 08/12/2018  Procedures:   Chest x-ray 08/11/2018, 08/14/2018  Antimicrobials:   Azithromycin 08/11/2018 1 dose.  IV Rocephin 08/11/2018--08/15/2018  Azithromycin 08/14/2018>>>> 08/18/2018   Subjective: Patient sitting up in chair sleeping.  Easily arousable.  Shortness of breath close to baseline.  Patient with complaints of weakness.  States mouth pain has improved.   Objective: Vitals:   08/30/18 2306 08/31/18 0045 08/31/18 0541 08/31/18 0933  BP:   (!) 104/59   Pulse:  99 93   Resp:  20 18   Temp:   97.8 F (36.6 C)   TempSrc:   Oral   SpO2: 97% 97% 98% 97%  Weight:      Height:        Intake/Output Summary  (Last 24 hours) at 08/31/2018 1141 Last data filed at 08/31/2018 1015 Gross per 24 hour  Intake 480 ml  Output 200 ml  Net 280 ml   Filed Weights   08/11/18 1711 08/14/18 0918  Weight: 62.6 kg 63.6 kg    Examination:  General exam: NAD Oropharynx: Some patches of thrush noted on the tip of the tongue.  Cold sores noted on the lip. Respiratory system: Poor to fair air movement.  No wheezing.  No crackles.  No rhonchi.  Speaking in full sentences.  No use of accessory muscles of respiration.  Cardiovascular system: RRR no murmurs rubs or gallops.  No JVD.  No lower extremity edema.  Gastrointestinal system: Abdomen is soft, nontender, nondistended, positive bowel sounds.  No rebound.  No guarding.   Central nervous system: Alert and oriented. No focal neurological deficits. Extremities: Symmetric 5 x 5 power. Skin: No rashes, lesions or ulcers Psychiatry: Judgement and insight appear normal. Mood & affect appropriate.     Data Reviewed: I have personally reviewed following labs and imaging studies  CBC: Recent Labs  Lab 08/25/18 0649 08/28/18 0405  WBC 15.5* 15.5*  NEUTROABS  --  14.6*  HGB 13.2 13.4  HCT 42.0 43.9  MCV 99.1 100.5*  PLT 228 741   Basic Metabolic Panel: Recent Labs  Lab 08/25/18 0649 08/28/18 0405 08/30/18 0528 08/31/18 0436  NA  --  139 140 138  K  --  4.2 3.2* 3.6  CL  --  95* 93* 94*  CO2  --  37* 39* 36*  GLUCOSE  --  186* 92 172*  BUN  --  24* 23 19  CREATININE 0.47 0.49 0.51 0.47  CALCIUM  --  8.2* 8.0* 7.9*   GFR: Estimated Creatinine Clearance: 60.9 mL/min (by C-G formula based on SCr of 0.47 mg/dL). Liver Function Tests: No results for input(s): AST, ALT, ALKPHOS, BILITOT, PROT, ALBUMIN in the last 168 hours. No results for input(s): LIPASE, AMYLASE in the last 168 hours. No results for input(s): AMMONIA in the last 168 hours. Coagulation Profile: No results for input(s): INR, PROTIME in the last 168 hours. Cardiac Enzymes: No  results for input(s): CKTOTAL, CKMB, CKMBINDEX, TROPONINI in the last 168 hours. BNP (last 3 results) No results for input(s): PROBNP in the last 8760 hours. HbA1C: No results for input(s): HGBA1C in the last 72 hours. CBG: No results for input(s): GLUCAP in the last 168 hours. Lipid Profile: No results for input(s): CHOL, HDL, LDLCALC, TRIG, CHOLHDL, LDLDIRECT in the last 72 hours. Thyroid Function Tests: No results for input(s): TSH, T4TOTAL, FREET4, T3FREE, THYROIDAB in the last 72 hours. Anemia Panel: No results for input(s): VITAMINB12, FOLATE, FERRITIN, TIBC, IRON, RETICCTPCT in the last 72 hours. Sepsis Labs: No results for input(s): PROCALCITON, LATICACIDVEN in the last 168 hours.  No results found for this or any previous visit (from the past 240 hour(s)).       Radiology Studies: No results found.      Scheduled Meds:  arformoterol  15 mcg Nebulization BID   budesonide (PULMICORT) nebulizer solution  0.5 mg Nebulization BID   busPIRone  7.5 mg Oral TID   calcium-vitamin D  1 tablet Oral Daily   Chlorhexidine Gluconate Cloth  6 each Topical Daily   citalopram  10 mg Oral QHS   enoxaparin (LOVENOX) injection  40 mg Subcutaneous QHS   guaiFENesin  1,200 mg Oral BID   ipratropium  2 spray Each Nare QID   ipratropium-albuterol  3 mL Nebulization TID   magic mouthwash w/lidocaine  15 mL Oral QID   mouth rinse  15 mL Mouth Rinse BID   montelukast  10 mg Oral QHS   nystatin  5 mL Oral QID   pantoprazole  40 mg Oral Q0600   polyethylene glycol  17 g Oral BID   predniSONE  60 mg Oral Q breakfast   senna-docusate  1 tablet Oral BID   Continuous Infusions:  sodium chloride Stopped (08/23/18 1758)     LOS: 20 days    Time spent: 40 minutes    Irine Seal, MD [facility]  If 7PM-7AM, please contact night-coverage [email or web address] 08/31/2018, 11:41 AM

## 2018-08-31 NOTE — Progress Notes (Signed)
Patient ID: SANAI FRICK, female   DOB: Nov 09, 1945, 73 y.o.   MRN: [id number]  This NP visited patient at the bedside as a follow up for palliative medicine needs and emotional support.    Patient is alert and oriented.  She verbalizes  her frustration with her current medical situation.  He wishes that staff would help her work on her independence more aggressively. "I just want to get on a bedside commode for a BM"      She speaks to not having any rehabilitation since last week.  She tells me she has been doing active range of motion exercises on her upper body.  Again we spoke to the seriousness of her current medical situation, she understands and remains hopeful for improvement.     She hopes to stabilize and access short-term rehabilitation with the ultimate goal of returning home.  Recommend community based palliative services  Discussed the significance  /hope of decreasing need for BiPAP at night.  Discussed with Dr Grandville Silos.    Discussed with patient the importance of continued conversation with her family and the medical providers regarding overall plan of care and treatment options,  ensuring decisions are within the context of the patients values and GOCs.  Questions and concerns addressed.    Discussed with  bedside RN  Total time spent on the unit was 35 minutes  Greater than 50% of the time was spent in counseling and coordination of care  Wadie Lessen NP  Palliative Medicine Team Team Phone # [phone] Pager [phone]

## 2018-08-31 NOTE — NC FL2 (Signed)
[place] LEVEL OF CARE SCREENING TOOL     IDENTIFICATION  Patient Name: Courtney Ayers Birthdate: 1945-10-03 Sex: female Admission Date (Current Location): 08/11/2018  [facility] and [place] Number:  Herbalist and Address:  [facility],  [place], [place]      Provider Number: [id number]  Attending Physician Name and Address:  Eugenie Filler, MD  Relative Name and Phone Number:       Current Level of Care: Hospital Recommended Level of Care: [place] Prior Approval Number:    Date Approved/Denied:   PASRR Number: pending  Discharge Plan: SNF    Current Diagnoses: Patient Active Problem List   Diagnosis Date Noted  . Constipation 08/31/2018  . Palliative care by specialist   . DNR (do not resuscitate)   . Debility 08/26/2018  . COPD with acute exacerbation ([place]) 08/11/2018  . Sepsis ([place]) 08/11/2018  . HTN (hypertension) 08/11/2018  . Tachypnea 08/11/2018  . COPD (chronic obstructive pulmonary disease) ([place]) 06/04/2016  . Prolonged Q-T interval on ECG 06/04/2016  . Hyperglycemia 06/04/2016  . Chronic respiratory failure with hypoxia ([place]) 07/20/2015  . Multiple pulmonary nodules 08/26/2014  . COPD exacerbation ([place])   . Acute on chronic respiratory failure with hypoxia and hypercapnia ([place]) 08/05/2014  . Shortness of breath 08/05/2014  . History of colonic polyps 08/29/2011  . Diverticulosis of colon (without mention of hemorrhage) 08/29/2011  . Osteopenia 01/30/2011  . Anxiety 01/30/2011  . Dysthymia 01/30/2011  . Allergic rhinitis 01/30/2011    Orientation RESPIRATION BLADDER Height & Weight     Self, Time, Situation, Place  Other (Comment), O2(2L o2, bipap at night)  bipap settings- adult full face mask large, respiratory rate 22, IPAP 12, EPAP 6, O2 % 28, flow rate 2 Incontinent(intermittent) Weight: 140 lb 3.4 oz (63.6 kg) Height:  5\' 7"  (170.2 cm)  BEHAVIORAL SYMPTOMS/MOOD  NEUROLOGICAL BOWEL NUTRITION STATUS      Continent Diet(regular)  AMBULATORY STATUS COMMUNICATION OF NEEDS Skin   Extensive Assist Verbally                         Personal Care Assistance Level of Assistance  Bathing, Feeding, Dressing Bathing Assistance: Maximum assistance Feeding assistance: Independent Dressing Assistance: Limited assistance     Functional Limitations Info  Sight, Hearing, Speech Sight Info: Adequate Hearing Info: Adequate Speech Info: Adequate    SPECIAL CARE FACTORS FREQUENCY  PT (By licensed PT), OT (By licensed OT)     PT Frequency: 5x OT Frequency: 5x            Contractures Contractures Info: Not present    Additional Factors Info  Code Status, Allergies Code Status Info: partial Allergies Info: nka           Current Medications (08/31/2018):  This is the current hospital active medication list Current Facility-Administered Medications  Medication Dose Route Frequency Provider Last Rate Last Dose  . 0.9 %  sodium chloride infusion   Intravenous PRN Swayze, Ava, DO   Stopped at 08/23/18 1758  . albuterol (PROVENTIL) (2.5 MG/3ML) 0.083% nebulizer solution 2.5 mg  2.5 mg Nebulization Q2H PRN Swayze, Ava, DO   2.5 mg at 08/13/18 0330  . ALPRAZolam (XANAX) tablet 0.5 mg  0.5 mg Oral TID PRN Swayze, Ava, DO   0.5 mg at 08/31/18 1004  . arformoterol (BROVANA) nebulizer solution 15 mcg  15 mcg Nebulization BID Swayze, Ava, DO  15 mcg at 08/31/18 0941  . budesonide (PULMICORT) nebulizer solution 0.5 mg  0.5 mg Nebulization BID Swayze, Ava, DO   0.5 mg at 08/31/18 0947  . busPIRone (BUSPAR) tablet 7.5 mg  7.5 mg Oral TID Swayze, Ava, DO   7.5 mg at 08/31/18 1004  . calcium-vitamin D (OSCAL WITH D) 500-200 MG-UNIT per tablet 1 tablet  1 tablet Oral Daily Swayze, Ava, DO   1 tablet at 08/31/18 1004  . Chlorhexidine Gluconate Cloth 2 % PADS 6 each  6 each Topical Daily Swayze, Ava, DO   6 each at 08/28/18 0926  . citalopram (CELEXA) tablet 10 mg   10 mg Oral QHS Swayze, Ava, DO   10 mg at 08/30/18 2224  . enoxaparin (LOVENOX) injection 40 mg  40 mg Subcutaneous QHS Swayze, Ava, DO   40 mg at 08/30/18 2219  . guaiFENesin (MUCINEX) 12 hr tablet 1,200 mg  1,200 mg Oral BID Swayze, Ava, DO   1,200 mg at 08/31/18 1004  . ipratropium (ATROVENT) 0.06 % nasal spray 2 spray  2 spray Each Nare QID Swayze, Ava, DO   2 spray at 08/31/18 1005  . ipratropium-albuterol (DUONEB) 0.5-2.5 (3) MG/3ML nebulizer solution 3 mL  3 mL Nebulization TID Swayze, Ava, DO   3 mL at 08/31/18 1445  . lip balm (CARMEX) ointment   Topical PRN Swayze, Ava, DO      . magic mouthwash w/lidocaine  15 mL Oral QID Eugenie Filler, MD   15 mL at 08/31/18 1335  . MEDLINE mouth rinse  15 mL Mouth Rinse BID Swayze, Ava, DO   15 mL at 08/31/18 1015  . montelukast (SINGULAIR) tablet 10 mg  10 mg Oral QHS Swayze, Ava, DO   10 mg at 08/30/18 2215  . morphine 2 MG/ML injection 1 mg  1 mg Intravenous Q4H PRN Swayze, Ava, DO   1 mg at 08/27/18 2153  . nystatin (MYCOSTATIN) 100000 UNIT/ML suspension 500,000 Units  5 mL Oral QID Eugenie Filler, MD   500,000 Units at 08/31/18 1335  . oxymetazoline (AFRIN) 0.05 % nasal spray 1 spray  1 spray Each Nare BID PRN Swayze, Ava, DO   1 spray at 08/30/18 2217  . pantoprazole (PROTONIX) EC tablet 40 mg  40 mg Oral Q0600 Eugenie Filler, MD   40 mg at 08/31/18 0541  . polyethylene glycol (MIRALAX / GLYCOLAX) packet 17 g  17 g Oral BID Eugenie Filler, MD      . predniSONE (DELTASONE) tablet 60 mg  60 mg Oral Q breakfast Swayze, Ava, DO   60 mg at 08/31/18 0732  . senna-docusate (Senokot-S) tablet 1 tablet  1 tablet Oral BID Eugenie Filler, MD      . sodium chloride (OCEAN) 0.65 % nasal spray 1 spray  1 spray Each Nare PRN Swayze, Ava, DO   1 spray at 08/23/18 2135  . valACYclovir (VALTREX) tablet 1,000 mg  1,000 mg Oral TID Eugenie Filler, MD   1,000 mg at 08/31/18 1156     Discharge Medications: Please see discharge summary for  a list of discharge medications.  Relevant Imaging Results:  Relevant Lab Results:   Additional Information SS# [id number]  Nila Nephew, LCSW

## 2018-08-31 NOTE — Care Management Important Message (Signed)
Important Message  Patient Details  Name: ADEN YOUNGMAN MRN: [id number] Date of Birth: 07-14-1945   Medicare Important Message Given:  Yes    Kerin Salen 08/31/2018, 10:23 AMImportant Message  Patient Details  Name: LETISHA YERA MRN: [id number] Date of Birth: 21-Mar-1946   Medicare Important Message Given:  Yes    Kerin Salen 08/31/2018, 10:23 AM

## 2018-08-31 NOTE — TOC Initial Note (Signed)
Transition of Care [facility]) - Initial/Assessment Note    Patient Details  Name: Courtney Ayers MRN: [id number] Date of Birth: [birth year]  Transition of [facility]) CM/SW Contact:    Nila Nephew, LCSW Phone Number: [phone] 08/31/2018, 2:54 PM  Clinical Narrative:       Pt admitted from home where she resides with 2 adult daughter. Admitted with COPD exacerbation.  States at baseline she is independent with ambulating and ADLs and feels deconditioned during hospital stay. Agrees to pursuing SNF for rehabilitation. CSW explained SNF referral/placement process.  Submitted for PASRR- went to manual review. CSW will provide additional information to [place] must when requested.  Will follow up with pt and her sister for bed offers. .             Expected Discharge Plan: Skilled Nursing Facility Barriers to Discharge: [place] [facility]), SNF Pending bed offer   Patient Goals and CMS Choice Patient states their goals for this hospitalization and ongoing recovery are:: "I was getting around fine before" CMS Medicare.gov Compare Post Acute Care list provided to:: Patient Choice offered to / list presented to : (will provide to pt and sister once list available)  Expected Discharge Plan and Services Expected Discharge Plan: [place] Acute Care Choice: [place] Living arrangements for the past 2 months: Single Family Home Expected Discharge Date: (unknown)                        Prior Living Arrangements/Services Living arrangements for the past 2 months: Single Family Home Lives with:: Adult Children Patient language and need for interpreter reviewed:: No Do you feel safe going back to the place where you live?: Yes      Need for Family Participation in Patient Care: Yes (Comment)(pt asks that sister be involved) Care giver support system in place?: Yes (comment)(daughters, sister)   Criminal Activity/Legal Involvement  Pertinent to Current Situation/Hospitalization: No - Comment as needed  Activities of Daily Living Home Assistive Devices/Equipment: Oxygen, Nebulizer, Eyeglasses, Other (Comment)(transport chair) ADL Screening (condition at time of admission) Patient's cognitive ability adequate to safely complete daily activities?: Yes Is the patient deaf or have difficulty hearing?: No Does the patient have difficulty seeing, even when wearing glasses/contacts?: No Does the patient have difficulty concentrating, remembering, or making decisions?: No Patient able to express need for assistance with ADLs?: Yes Does the patient have difficulty dressing or bathing?: No Independently performs ADLs?: Yes (appropriate for developmental age) Does the patient have difficulty walking or climbing stairs?: Yes(gets short of breath) Weakness of Legs: Both Weakness of Arms/Hands: Both  Permission Sought/Granted Permission sought to share information with : Family Supports Permission granted to share information with : Yes, Release of Information Signed  Share Information with NAME: sister Arbie Cookey [phone]           Emotional Assessment   Attitude/Demeanor/Rapport: Engaged Affect (typically observed): Accepting, Hopeful Orientation: : Oriented to Self, Oriented to Place, Oriented to  Time, Oriented to Situation Alcohol / Substance Use: Not Applicable Psych Involvement: No (comment)  Admission diagnosis:  Shortness of breath [R06.02] COPD exacerbation ([place]) [J44.1] Patient Active Problem List   Diagnosis Date Noted  . Constipation 08/31/2018  . Palliative care by specialist   . DNR (do not resuscitate)   . Debility 08/26/2018  . COPD with acute exacerbation ([place]) 08/11/2018  . Sepsis ([place]) 08/11/2018  . HTN (hypertension) 08/11/2018  . Tachypnea 08/11/2018  .  COPD (chronic obstructive pulmonary disease) ([place]) 06/04/2016  . Prolonged Q-T interval on ECG 06/04/2016  . Hyperglycemia 06/04/2016  .  Chronic respiratory failure with hypoxia ([place]) 07/20/2015  . Multiple pulmonary nodules 08/26/2014  . COPD exacerbation ([place])   . Acute on chronic respiratory failure with hypoxia and hypercapnia ([place]) 08/05/2014  . Shortness of breath 08/05/2014  . History of colonic polyps 08/29/2011  . Diverticulosis of colon (without mention of hemorrhage) 08/29/2011  . Osteopenia 01/30/2011  . Anxiety 01/30/2011  . Dysthymia 01/30/2011  . Allergic rhinitis 01/30/2011   PCP:  [name], MD Pharmacy:   [id number] - [place], [place]. AT [address] [place] [address] Phone: [phone] Fax: [place], [address] [place] [address] Phone: [phone] Fax: [phone]     Social Determinants of Health (SDOH) Interventions    Readmission Risk Interventions No flowsheet data found.

## 2018-08-31 NOTE — Progress Notes (Signed)
Occupational Therapy Treatment Patient Details Name: Courtney Ayers MRN: [id number] DOB: [birth year] Today's Date: 08/31/2018    History of present illness pt was admitted for acute COPD exacerbation.  PMH:  OA and depression   OT comments  Pt OOB and agreeable to grooming as well as BUE exercise  Follow Up Recommendations  SNF    Equipment Recommendations  3 in 1 bedside commode       Precautions / Restrictions Precautions Precautions: Fall Restrictions Weight Bearing Restrictions: No       Mobility Bed Mobility               General bed mobility comments: Pt OOB  Transfers                 General transfer comment: did not perform        ADL either performed or assessed with clinical judgement   ADL Overall ADL's : Needs assistance/impaired     Grooming: Set up;Sitting;Brushing hair;Wash/dry face;Oral care                                 General ADL Comments: Pt OOB and did agreed to BUE as well as grooming activity     Vision Patient Visual Report: No change from baseline            Cognition Arousal/Alertness: Awake/alert Behavior During Therapy: WFL for tasks assessed/performed Overall Cognitive Status: Within Functional Limits for tasks assessed                                          Exercises Other Exercises Other Exercises: used  level one theraband for biceps, anterior delts, and pects, one arm at a time with rest breaks.  Pt performed one set of 10-12 for each exercise.  Handout provided        Frequency  Min 2X/week        Progress Toward Goals  OT Goals(current goals can now be found in the care plan section)        Plan Discharge plan needs to be updated;Discharge plan remains appropriate       AM-PAC OT "6 Clicks" Daily Activity     Outcome Measure   Help from another person eating meals?: None Help from another person taking care of personal grooming?: A Little Help  from another person toileting, which includes using toliet, bedpan, or urinal?: A Lot Help from another person bathing (including washing, rinsing, drying)?: A Little Help from another person to put on and taking off regular upper body clothing?: A Little Help from another person to put on and taking off regular lower body clothing?: A Lot 6 Click Score: 17    End of Session        Activity Tolerance Patient tolerated treatment well   Patient Left in chair;with call bell/phone within reach;with chair alarm set   Nurse Communication          Time: 1217-1227 OT Time Calculation (min): 10 min  Charges: OT General Charges $OT Visit: 1 Visit OT Treatments $Self Care/Home Management : 8-22 mins  Kari Baars, OT Acute Rehabilitation Services [phone] Office- [place], Edwena Felty D 08/31/2018, 1:56 PM

## 2018-08-31 NOTE — Progress Notes (Signed)
Physical Therapy Treatment Patient Details Name: CASEE KNEPP MRN: [id number] DOB: July 17, 1945 Today's Date: 08/31/2018    History of Present Illness pt was admitted for acute COPD exacerbation.  PMH:  OA and depression    PT Comments    Pt remains on 2 lts nasal.  Avg sats 90% and avg HR 111.  Assisted OOB to [facility]. General bed mobility comments: required assist for upper body and assist using bed pad to complete scooting to EOB.  Once EOB upright, pt required x 4 min rest break.  General bed mobility comments: required assist for upper body and assist using bed pad to complete scooting to EOB.  Once EOB upright, pt required x 4 min rest break.  General Gait Details: transfers only.  Attempted pre gait marching and B knees buckled/unable to support own weight.  Follow Up Recommendations  SNF(pt not progressing as well as she hoped.  Plans are for ST Rehab at [facility])     Equipment Recommendations       Recommendations for Other Services       Precautions / Restrictions Precautions Precautions: Fall Precaution Comments: monitor O2, high anxiety,  Restrictions Weight Bearing Restrictions: No    Mobility  Bed Mobility Overal bed mobility: Needs Assistance Bed Mobility: Supine to Sit     Supine to sit: Mod assist     General bed mobility comments: required assist for upper body and assist using bed pad to complete scooting to EOB.  Once EOB upright, pt required x 4 min rest break.    Transfers Overall transfer level: Needs assistance Equipment used: 1 person hand held assist Transfers: Sit to/from [facility] Sit to Stand: Mod assist Stand pivot transfers: Mod assist;Max assist       General transfer comment: assisted from elevated bed to [facility] 1/4 turn with 75% VC's on proper hand placement and increased assist to complete 1/4 pivot.  Very weak B LE's.  B knees buckles.  Brief stance time due to K knees buckle  Pt required an extended rest break between each  activity due to anxiety and dyspnea.  Ambulation/Gait             General Gait Details: transfers only.  Attempted pre gait marching and B knees buckled/unable to support own weight.   Stairs             Wheelchair Mobility    Modified Rankin (Stroke Patients Only)       Balance                                            Cognition Arousal/Alertness: Awake/alert Behavior During Therapy: WFL for tasks assessed/performed Overall Cognitive Status: Within Functional Limits for tasks assessed                                 General Comments: pleasant and motivated but very limited activity tolerance and requires freq rest breaks      Exercises Other Exercises Other Exercises: used  level one theraband for biceps, anterior delts, and pects, one arm at a time with rest breaks.  Pt performed one set of 10-12 for each exercise.  Handout provided    General Comments        Pertinent Vitals/Pain Pain Assessment: No/denies pain    Home Living  Prior Function            PT Goals (current goals can now be found in the care plan section)      Frequency    Min 3X/week      PT Plan Current plan remains appropriate    Co-evaluation              AM-PAC PT "6 Clicks" Mobility   Outcome Measure  Help needed turning from your back to your side while in a flat bed without using bedrails?: A Lot Help needed moving from lying on your back to sitting on the side of a flat bed without using bedrails?: A Lot Help needed moving to and from a bed to a chair (including a wheelchair)?: A Lot Help needed standing up from a chair using your arms (e.g., wheelchair or bedside chair)?: Total Help needed to walk in hospital room?: Total Help needed climbing 3-5 steps with a railing? : Total 6 Click Score: 9    End of Session Equipment Utilized During Treatment: Gait belt Activity Tolerance: Patient  limited by fatigue Patient left: in chair;with call bell/phone within reach;with chair alarm set Nurse Communication: Mobility status PT Visit Diagnosis: Unsteadiness on feet (R26.81)     Time: 8264-1583 PT Time Calculation (min) (ACUTE ONLY): 40 min  Charges:  $Therapeutic Activity: 38-52 mins                     Rica Koyanagi  PTA Acute  Rehabilitation Services Pager      [phone] Office      [phone]

## 2018-08-31 NOTE — Progress Notes (Signed)
Pt refused enema

## 2018-09-01 LAB — CBC WITH DIFFERENTIAL/PLATELET
Abs Immature Granulocytes: 0.09 10*3/uL — ABNORMAL HIGH (ref 0.00–0.07)
Basophils Absolute: 0 10*3/uL (ref 0.0–0.1)
Basophils Relative: 0 %
EOS PCT: 0 %
Eosinophils Absolute: 0 10*3/uL (ref 0.0–0.5)
HCT: 38.4 % (ref 36.0–46.0)
Hemoglobin: 11.9 g/dL — ABNORMAL LOW (ref 12.0–15.0)
Immature Granulocytes: 1 %
Lymphocytes Relative: 5 %
Lymphs Abs: 0.7 10*3/uL (ref 0.7–4.0)
MCH: 30.7 pg (ref 26.0–34.0)
MCHC: 31 g/dL (ref 30.0–36.0)
MCV: 99.2 fL (ref 80.0–100.0)
Monocytes Absolute: 0.6 10*3/uL (ref 0.1–1.0)
Monocytes Relative: 4 %
Neutro Abs: 14.3 10*3/uL — ABNORMAL HIGH (ref 1.7–7.7)
Neutrophils Relative %: 90 %
Platelets: 147 10*3/uL — ABNORMAL LOW (ref 150–400)
RBC: 3.87 MIL/uL (ref 3.87–5.11)
RDW: 13.3 % (ref 11.5–15.5)
WBC: 15.7 10*3/uL — ABNORMAL HIGH (ref 4.0–10.5)
nRBC: 0 % (ref 0.0–0.2)

## 2018-09-01 LAB — BASIC METABOLIC PANEL
Anion gap: 8 (ref 5–15)
BUN: 20 mg/dL (ref 8–23)
CO2: 35 mmol/L — ABNORMAL HIGH (ref 22–32)
Calcium: 8 mg/dL — ABNORMAL LOW (ref 8.9–10.3)
Chloride: 93 mmol/L — ABNORMAL LOW (ref 98–111)
Creatinine, Ser: 0.43 mg/dL — ABNORMAL LOW (ref 0.44–1.00)
GFR calc Af Amer: >60 mL/min (ref >60)
GFR calc non Af Amer: >60 mL/min (ref >60)
Glucose, Bld: 146 mg/dL — ABNORMAL HIGH (ref 70–99)
Potassium: 3 mmol/L — ABNORMAL LOW (ref 3.5–5.1)
Sodium: 136 mmol/L (ref 135–145)

## 2018-09-01 LAB — MAGNESIUM: Magnesium: 2.2 mg/dL (ref 1.7–2.4)

## 2018-09-01 MED ORDER — MAGIC MOUTHWASH W/LIDOCAINE
15.0000 mL | Freq: Three times a day (TID) | ORAL | Status: DC | PRN
  Filled 2018-09-01: qty 15

## 2018-09-01 MED ORDER — PREDNISONE 50 MG PO TABS
50.0000 mg | ORAL_TABLET | Freq: Every day | ORAL | Status: DC
  Administered 2018-09-01 – 2018-09-03 (×3): 50 mg via ORAL
  Filled 2018-09-01 (×3): qty 1

## 2018-09-01 MED ORDER — POTASSIUM CHLORIDE CRYS ER 20 MEQ PO TBCR
40.0000 meq | EXTENDED_RELEASE_TABLET | ORAL | Status: AC
  Administered 2018-09-01 (×2): 40 meq via ORAL
  Filled 2018-09-01 (×2): qty 2

## 2018-09-01 NOTE — Progress Notes (Signed)
PROGRESS NOTE    Courtney Ayers  TML:[id number] DOB: 04-09-1946 DOA: 08/11/2018 PCP: [name], MD   Brief Narrative:  HPI per Dr. Ciro Backer is a 73 y.o. female with medical history significant of COPD, depression, allergic rhinitis, hypertension, who presented with significant shortness of breath and cough.  Symptoms started about a week ago and progressively getting worse.Patient follows up with pulmonology.  She has been on multiple pulmonary medications that seem to have worked for her until now.  She was feeling weak.  She took doxycycline also but get some stomach upset.  She felt congested with her sinuses and try to treat this symptomatically.  She continues to have shortness of breath.  She decided to come to the emergency room where she was having significant tachypnea with a temperature of 101.  No pneumonia on x-ray.  She was however having bilateral wheezing consistent with acute exacerbation of COPD.  Viral screen so far negative.  Patient has been admitted with acute exacerbation of COPD.[name]  ED Course: Temperature 101.1 blood pressure 189/82, pulse of 116, respirate 39 oxygen sat 92% on 2 L.  Patient CBC and chemistry appeared to be within normal except for troponin 0 0.05 glucose 123.  Chest x-ray showed no acute findings.  Influenza AMB negative.  Patient being admitted with acute exacerbation COPD.  Assessment & Plan:   Principal Problem:   Acute on chronic respiratory failure with hypoxia and hypercapnia (HCC) Active Problems:   Anxiety   Shortness of breath   COPD exacerbation (HCC)   COPD with acute exacerbation (HCC)   HTN (hypertension)   Tachypnea   Debility   Palliative care by specialist   DNR (do not resuscitate)   Constipation  #1 acute on chronic hypoxic respiratory failure secondary to acute COPD exacerbation Patient improving clinically since admission and likely close to baseline.  O2 requirements at baseline.  Patient states she is  significantly weak with inability to stand up on her own.  Patient status post full course of antibiotic treatment.  Continue Pulmicort, Brovana, Mucinex, duo nebs, Atrovent nasal spray, slow steroid taper, Protonix, Singulair.  Will need outpatient follow-up with pulmonary.  2.  Depression/anxiety Stable.  Continue current regimen of Celexa, BuSpar, Xanax as needed.  Outpatient follow-up.   3.  Hypertension Stable.  Hydralazine as needed.  4.  Oral thrush/cold sores Patient with complaints of mouth pain and noted to have some whitish exudates on the tip of the tongue.  Patient on a slow prednisone taper.  Some clinical improvement on Magic mouthwash with lidocaine and nystatin swish and swallow.  Patient with cold sores on the lip.  Continue Valtrex 1000 mg 3 times daily to complete 7 day course.  5.  Debility Patient states she is significantly weak with inability to stand up on her own.  PT/OT.  Patient does not feel she will be able to manage at home.  Social work consult for skilled nursing facility placement.  Follow.  6.  Hypokalemia Replete.   DVT prophylaxis: Lovenox Code Status: Partial Family Communication: Updated patient.  No family at bedside. Disposition Plan: Skilled nursing facility when bed available.   Consultants:   Palliative care: Dr. Domingo Cocking 08/19/2018  Pulmonary: Dr. Lake Bells 08/12/2018  Procedures:   Chest x-ray 08/11/2018, 08/14/2018  Antimicrobials:   Azithromycin 08/11/2018 1 dose.  IV Rocephin 08/11/2018--08/15/2018  Azithromycin 08/14/2018>>>> 08/18/2018   Subjective: Patient in chair sleeping. Easily arousable. No CP. No SOB. Mouth pain improving.  Objective: Vitals:   08/31/18 2035 08/31/18 2117 09/01/18 0610 09/01/18 0915  BP: 121/62  99/61   Pulse: 95  88   Resp: 19  20   Temp: 98.7 F (37.1 C)  98 F (36.7 C)   TempSrc: Oral  Oral   SpO2: 98% 96% 95% 94%  Weight:      Height:        Intake/Output Summary (Last 24 hours) at 09/01/2018  1301 Last data filed at 09/01/2018 1100 Gross per 24 hour  Intake 240 ml  Output 200 ml  Net 40 ml   Filed Weights   08/11/18 1711 08/14/18 0918  Weight: 62.6 kg 63.6 kg    Examination:  General exam: NAD Oropharynx: Some patches of thrush noted on the tip of the tongue.  Cold sores noted on the lip. Respiratory system: Poor to fair air movement.  No wheezing, no crackles, no rhonchi.  No use of accessory muscles of respiration.  Cardiovascular system: Regular rate rhythm no murmurs rubs or gallops.  No JVD.  No LE edema.  Gastrointestinal system: Abdomen is nontender, nondistended, soft, positive bowel sounds.  No rebound.  No guarding.  Central nervous system: Alert and oriented. No focal neurological deficits. Extremities: Symmetric 5 x 5 power. Skin: No rashes, lesions or ulcers Psychiatry: Judgement and insight appear normal. Mood & affect appropriate.     Data Reviewed: I have personally reviewed following labs and imaging studies  CBC: Recent Labs  Lab 08/28/18 0405 09/01/18 0911  WBC 15.5* 15.7*  NEUTROABS 14.6* 14.3*  HGB 13.4 11.9*  HCT 43.9 38.4  MCV 100.5* 99.2  PLT 172 673*   Basic Metabolic Panel: Recent Labs  Lab 08/28/18 0405 08/30/18 0528 08/31/18 0436 09/01/18 0911  NA 139 140 138 136  K 4.2 3.2* 3.6 3.0*  CL 95* 93* 94* 93*  CO2 37* 39* 36* 35*  GLUCOSE 186* 92 172* 146*  BUN 24* 23 19 20   CREATININE 0.49 0.51 0.47 0.43*  CALCIUM 8.2* 8.0* 7.9* 8.0*  MG  --   --   --  2.2   GFR: Estimated Creatinine Clearance: 60.9 mL/min (A) (by C-G formula based on SCr of 0.43 mg/dL (L)). Liver Function Tests: No results for input(s): AST, ALT, ALKPHOS, BILITOT, PROT, ALBUMIN in the last 168 hours. No results for input(s): LIPASE, AMYLASE in the last 168 hours. No results for input(s): AMMONIA in the last 168 hours. Coagulation Profile: No results for input(s): INR, PROTIME in the last 168 hours. Cardiac Enzymes: No results for input(s): CKTOTAL,  CKMB, CKMBINDEX, TROPONINI in the last 168 hours. BNP (last 3 results) No results for input(s): PROBNP in the last 8760 hours. HbA1C: No results for input(s): HGBA1C in the last 72 hours. CBG: No results for input(s): GLUCAP in the last 168 hours. Lipid Profile: No results for input(s): CHOL, HDL, LDLCALC, TRIG, CHOLHDL, LDLDIRECT in the last 72 hours. Thyroid Function Tests: No results for input(s): TSH, T4TOTAL, FREET4, T3FREE, THYROIDAB in the last 72 hours. Anemia Panel: No results for input(s): VITAMINB12, FOLATE, FERRITIN, TIBC, IRON, RETICCTPCT in the last 72 hours. Sepsis Labs: No results for input(s): PROCALCITON, LATICACIDVEN in the last 168 hours.  No results found for this or any previous visit (from the past 240 hour(s)).       Radiology Studies: No results found.      Scheduled Meds:  arformoterol  15 mcg Nebulization BID   budesonide (PULMICORT) nebulizer solution  0.5 mg Nebulization BID   busPIRone  7.5 mg Oral TID   calcium-vitamin D  1 tablet Oral Daily   Chlorhexidine Gluconate Cloth  6 each Topical Daily   citalopram  10 mg Oral QHS   enoxaparin (LOVENOX) injection  40 mg Subcutaneous QHS   guaiFENesin  1,200 mg Oral BID   ipratropium  2 spray Each Nare QID   ipratropium-albuterol  3 mL Nebulization TID   mouth rinse  15 mL Mouth Rinse BID   montelukast  10 mg Oral QHS   nystatin  5 mL Oral QID   pantoprazole  40 mg Oral Q0600   polyethylene glycol  17 g Oral BID   potassium chloride  40 mEq Oral Q4H   predniSONE  50 mg Oral Q breakfast   senna-docusate  1 tablet Oral BID   valACYclovir  1,000 mg Oral TID   Continuous Infusions:  sodium chloride Stopped (08/23/18 1758)     LOS: 21 days    Time spent: 35 minutes    Irine Seal, MD [facility]  If 7PM-7AM, please contact night-coverage [email or web address] 09/01/2018, 1:01 PM

## 2018-09-01 NOTE — Progress Notes (Signed)
Physical Therapy Treatment Patient Details Name: Courtney Ayers MRN: [id number] DOB: Feb 14, 1946 Today's Date: 09/01/2018    History of Present Illness pt was admitted for acute COPD exacerbation.  PMH:  OA and depression    PT Comments    Pt OOB on BSC with NT.  Assisted with standing, peri care and pt was able to amb 3 feet.  General transfer comment: 25% VC's on proper hand placement and transfer from [facility] to walker.  Static standing for hygiene and assist to control stand to sit as B knees buckled with little control.  General Gait Details: pt was able to weight shift and take a few steps forward before B knees buckled due to weakness.   Pt was home alone and Indep prior to hospitalization.  Pt will need ST Rehab at SNF.    Follow Up Recommendations  SNF     Equipment Recommendations       Recommendations for Other Services       Precautions / Restrictions Precautions Precautions: Fall Precaution Comments: monitor O2, high anxiety(but improvong) Restrictions Weight Bearing Restrictions: No    Mobility  Bed Mobility               General bed mobility comments: OOB on BSC with NT  Transfers Overall transfer level: Needs assistance Equipment used: Rolling walker (2 wheeled) Transfers: Sit to/from Stand Sit to Stand: Mod assist;Min assist         General transfer comment: 25% VC's on proper hand placement and transfer from [facility] to walker.  Static standing for hygiene and assist to control stand to sit as B knees buckled with little control.    Ambulation/Gait Ambulation/Gait assistance: Mod assist Gait Distance (Feet): 3 Feet Assistive device: Rolling walker (2 wheeled) Gait Pattern/deviations: Step-to pattern;Decreased step length - right;Decreased step length - left Gait velocity: decreased    General Gait Details: pt was able to weight shift and take a few steps forward before B knees buckled due to weakness.     Stairs             Wheelchair  Mobility    Modified Rankin (Stroke Patients Only)       Balance                                            Cognition Arousal/Alertness: Awake/alert Behavior During Therapy: WFL for tasks assessed/performed                                   General Comments: pleasant and motivated       Exercises      General Comments        Pertinent Vitals/Pain Pain Assessment: No/denies pain    Home Living                      Prior Function            PT Goals (current goals can now be found in the care plan section) Progress towards PT goals: Progressing toward goals    Frequency    Min 3X/week      PT Plan Current plan remains appropriate    Co-evaluation              AM-PAC PT "6 Clicks" Mobility  Outcome Measure  Help needed turning from your back to your side while in a flat bed without using bedrails?: A Lot Help needed moving from lying on your back to sitting on the side of a flat bed without using bedrails?: A Lot Help needed moving to and from a bed to a chair (including a wheelchair)?: A Lot Help needed standing up from a chair using your arms (e.g., wheelchair or bedside chair)?: A Lot Help needed to walk in hospital room?: Total Help needed climbing 3-5 steps with a railing? : Total 6 Click Score: 10    End of Session Equipment Utilized During Treatment: Gait belt Activity Tolerance: Patient limited by fatigue Patient left: in chair;with call bell/phone within reach;with chair alarm set Nurse Communication: Mobility status PT Visit Diagnosis: Unsteadiness on feet (R26.81)     Time: 1100-1116 PT Time Calculation (min) (ACUTE ONLY): 16 min  Charges:  $Gait Training: 8-22 mins                     {Ad Guttman  PTA Acute  Rehabilitation [employer]      [phone] Office      [phone]

## 2018-09-01 NOTE — Progress Notes (Signed)
Occupational Therapy Treatment Patient Details Name: Courtney Ayers MRN: [id number] DOB: 03-09-1946 Today's Date: 09/01/2018    History of present illness pt was admitted for acute COPD exacerbation.  PMH:  OA and depression   OT comments  Pt presents seated in recliner pleasant and willing to participate in therapy session. Pt engaged in seated UB exercises using level 1 theraband for continued strengthening/endurance as precursor to performing functional tasks. Pt return demonstrating exercises completed in previous sessions with additional exercises provided, min cues for technique and pt taking rest breaks PRN throughout. Feel POC remains appropriate at this time. Will continue to follow acutely.   Follow Up Recommendations  SNF    Equipment Recommendations  3 in 1 bedside commode          Precautions / Restrictions Precautions Precautions: Fall Precaution Comments: monitor O2, high anxiety(but improvong) Restrictions Weight Bearing Restrictions: No       Mobility Bed Mobility               General bed mobility comments: OOB in recliner upon arrival          ADL either performed or assessed with clinical judgement   ADL Overall ADL's : Needs assistance/impaired Eating/Feeding: Independent Eating/Feeding Details (indicate cue type and reason): taking medicine during session with applesauce                                   General ADL Comments: focus of session on UB strengthening using level 1 theraband                       Cognition Arousal/Alertness: Awake/alert Behavior During Therapy: WFL for tasks assessed/performed Overall Cognitive Status: Within Functional Limits for tasks assessed                                 General Comments: pleasant and motivated         Exercises Other Exercises Other Exercises: continued focus on level 1 theraband exercise for UB strengthening; pt demonstrating recall of UB  exercise from previous sessions; provided additional exercise to include shoulder flexion, abduction, horizontal abd/adduction - performing each 10-20 reps throughout alternating between L/R UE   Shoulder Instructions       General Comments      Pertinent Vitals/ Pain       Pain Assessment: No/denies pain  Home Living                                          Prior Functioning/Environment              Frequency  Min 2X/week        Progress Toward Goals  OT Goals(current goals can now be found in the care plan section)  Progress towards OT goals: Progressing toward goals  Acute Rehab OT Goals Patient Stated Goal: get strength back OT Goal Formulation: With patient Time For Goal Achievement: 09/09/18 Potential to Achieve Goals: Good ADL Goals Pt Will Transfer to Toilet: with supervision;bedside commode;stand pivot transfer Pt Will Perform Toileting - Clothing Manipulation and hygiene: with supervision;sit to/from stand;sitting/lateral leans Additional ADL Goal #1: pt will complete adl with set up/extra time Additional ADL Goal #2: pt will complete bed mobility  at mod I level from regular bed  Plan Discharge plan remains appropriate    Co-evaluation                 AM-PAC OT "6 Clicks" Daily Activity     Outcome Measure   Help from another person eating meals?: None Help from another person taking care of personal grooming?: A Little Help from another person toileting, which includes using toliet, bedpan, or urinal?: A Lot Help from another person bathing (including washing, rinsing, drying)?: A Little Help from another person to put on and taking off regular upper body clothing?: A Little Help from another person to put on and taking off regular lower body clothing?: A Lot 6 Click Score: 17    End of Session    OT Visit Diagnosis: Muscle weakness (generalized) (M62.81);Unsteadiness on feet (R26.81)   Activity Tolerance Patient  tolerated treatment well   Patient Left in chair;with call bell/phone within reach   Nurse Communication Mobility status        Time: 2902-1115 OT Time Calculation (min): 23 min  Charges: OT General Charges $OT Visit: 1 Visit OT Treatments $Therapeutic Activity: 8-22 mins  Lou Cal, OT Supplemental Rehabilitation Services Pager [phone] Office [phone]   Raymondo Band 09/01/2018, 1:53 PM

## 2018-09-02 ENCOUNTER — Inpatient Hospital Stay (HOSPITAL_COMMUNITY): Payer: Medicare Other

## 2018-09-02 DIAGNOSIS — R609 Edema, unspecified: Secondary | ICD-10-CM

## 2018-09-02 LAB — CBC WITH DIFFERENTIAL/PLATELET
Abs Immature Granulocytes: 0.05 10*3/uL (ref 0.00–0.07)
Basophils Absolute: 0 10*3/uL (ref 0.0–0.1)
Basophils Relative: 0 %
Eosinophils Absolute: 0 10*3/uL (ref 0.0–0.5)
Eosinophils Relative: 0 %
HCT: 33.2 % — ABNORMAL LOW (ref 36.0–46.0)
Hemoglobin: 10.3 g/dL — ABNORMAL LOW (ref 12.0–15.0)
Immature Granulocytes: 1 %
Lymphocytes Relative: 12 %
Lymphs Abs: 1.2 10*3/uL (ref 0.7–4.0)
MCH: 30.7 pg (ref 26.0–34.0)
MCHC: 31 g/dL (ref 30.0–36.0)
MCV: 98.8 fL (ref 80.0–100.0)
MONOS PCT: 5 %
Monocytes Absolute: 0.6 10*3/uL (ref 0.1–1.0)
Neutro Abs: 8.8 10*3/uL — ABNORMAL HIGH (ref 1.7–7.7)
Neutrophils Relative %: 82 %
Platelets: 125 10*3/uL — ABNORMAL LOW (ref 150–400)
RBC: 3.36 MIL/uL — ABNORMAL LOW (ref 3.87–5.11)
RDW: 13.2 % (ref 11.5–15.5)
WBC: 10.6 10*3/uL — ABNORMAL HIGH (ref 4.0–10.5)
nRBC: 0 % (ref 0.0–0.2)

## 2018-09-02 LAB — BASIC METABOLIC PANEL
ANION GAP: 6 (ref 5–15)
BUN: 15 mg/dL (ref 8–23)
CO2: 33 mmol/L — ABNORMAL HIGH (ref 22–32)
Calcium: 7.7 mg/dL — ABNORMAL LOW (ref 8.9–10.3)
Chloride: 100 mmol/L (ref 98–111)
Creatinine, Ser: 0.34 mg/dL — ABNORMAL LOW (ref 0.44–1.00)
GFR calc Af Amer: >60 mL/min (ref >60)
GLUCOSE: 78 mg/dL (ref 70–99)
Potassium: 3.8 mmol/L (ref 3.5–5.1)
Sodium: 139 mmol/L (ref 135–145)

## 2018-09-02 NOTE — Progress Notes (Signed)
Physical Therapy Treatment Patient Details Name: Courtney Ayers MRN: [id number] DOB: 07-17-1945 Today's Date: 09/02/2018    History of Present Illness pt was admitted for acute COPD exacerbation.  PMH:  OA and depression    PT Comments    Pt progressing slowly.  Assisted OOB to [facility] and amb a very limited distance.  Pt required 3 lts vs 2 this session.  Dyspnea 3/4.  Pt will need ST Rehab at SNF.  Follow Up Recommendations  SNF     Equipment Recommendations       Recommendations for Other Services       Precautions / Restrictions Precautions Precautions: Fall Precaution Comments: monitor O2, high anxiety(but improvong) Restrictions Weight Bearing Restrictions: No    Mobility  Bed Mobility Overal bed mobility: Needs Assistance Bed Mobility: Supine to Sit     Supine to sit: Mod assist     General bed mobility comments: Mod Assist and increased time with rest breaks between each activity  Transfers Overall transfer level: Needs assistance Equipment used: Rolling walker (2 wheeled) Transfers: Sit to/from [facility] Sit to Stand: Mod assist;Min assist Stand pivot transfers: Mod assist;Max assist       General transfer comment: 25% VC's on proper hand placement and transfer from [facility] to walker.  Static standing for hygiene and assist to control stand to sit as B knees buckled with little control.    Ambulation/Gait Ambulation/Gait assistance: Mod assist;+2 physical assistance;+2 safety/equipment Gait Distance (Feet): 2 Feet Assistive device: Rolling walker (2 wheeled) Gait Pattern/deviations: Step-to pattern;Decreased step length - right;Decreased step length - left     General Gait Details: very limited distance due to 3/4 dyspnea.  Required 3 vs 2 lts this session with avg HR 119 during activity.     Stairs             Wheelchair Mobility    Modified Rankin (Stroke Patients Only)       Balance                                            Cognition Arousal/Alertness: Awake/alert Behavior During Therapy: WFL for tasks assessed/performed Overall Cognitive Status: Within Functional Limits for tasks assessed                                 General Comments: pleasant and motivated but very limited activity tolerance       Exercises      General Comments        Pertinent Vitals/Pain Pain Assessment: No/denies pain    Home Living                      Prior Function            PT Goals (current goals can now be found in the care plan section) Progress towards PT goals: Progressing toward goals    Frequency    Min 3X/week      PT Plan Current plan remains appropriate    Co-evaluation              AM-PAC PT "6 Clicks" Mobility   Outcome Measure  Help needed turning from your back to your side while in a flat bed without using bedrails?: A Lot Help needed moving from lying on your back  to sitting on the side of a flat bed without using bedrails?: A Lot Help needed moving to and from a bed to a chair (including a wheelchair)?: A Lot Help needed standing up from a chair using your arms (e.g., wheelchair or bedside chair)?: A Lot Help needed to walk in hospital room?: Total Help needed climbing 3-5 steps with a railing? : Total 6 Click Score: 10    End of Session Equipment Utilized During Treatment: Gait belt Activity Tolerance: Patient limited by fatigue Patient left: in chair;with call bell/phone within reach;with chair alarm set Nurse Communication: Mobility status PT Visit Diagnosis: Unsteadiness on feet (R26.81)     Time: 8563-1497 PT Time Calculation (min) (ACUTE ONLY): 32 min  Charges:  $Gait Training: 8-22 mins $Therapeutic Activity: 8-22 mins                     Rica Koyanagi  PTA Acute  Rehabilitation Services Pager      [phone] Office      [phone]

## 2018-09-02 NOTE — Progress Notes (Addendum)
PROGRESS NOTE    Courtney Ayers  ZYS:[id number] DOB: Nov 20, 1945 DOA: 08/11/2018 PCP: [name], MD  Brief Narrative:  HPI per Dr. Charolotte Eke Lissy Courtney Ayers 73 y.o.femalewith medical history significant ofCOPD, depression, allergic rhinitis, hypertension, who presented with significant shortness of breath and cough. Symptoms started about Courtney Ayers week ago and progressively getting worse.Patient follows up with pulmonology.She has been on multiple pulmonary medications that seem to have worked for her until now. She was feeling weak. She took doxycycline also but get some stomach upset. She felt congested with her sinuses and try to treat this symptomatically. She continues to have shortness of breath. She decided to come to the emergency room where she was having significant tachypnea with [name] temperature of 101. No pneumonia on x-ray. She was however having bilateral wheezing consistent with acute exacerbation of COPD. Viral screen so far negative. Patient has been admitted with acute exacerbation of COPD.[name]  ED Course:Temperature 101.1 blood pressure 189/82, pulse of 116, respirate 39 oxygen sat 92% on 2 L. Patient CBC and chemistry appeared to be within normal except for troponin 0 0.05 glucose 123. Chest x-ray showed no acute findings. Influenza AMB negative. Patient being admitted with acute exacerbation COPD.  Assessment & Plan:   Principal Problem:   Acute on chronic respiratory failure with hypoxia and hypercapnia (HCC) Active Problems:   Anxiety   Shortness of breath   COPD exacerbation (HCC)   COPD with acute exacerbation (HCC)   HTN (hypertension)   Tachypnea   Debility   Palliative care by specialist   DNR (do not resuscitate)   Constipation   #1 acute on chronic hypoxic respiratory failure secondary to acute COPD exacerbation Improved, but still with significant SOB at baseline O2 currently at baseline on 2 L  Continue prn and scheduled  nebs Brovana, pulmicort Currently on oral prednisone, will need taper Not currently on any abx, s/p completion of course (4 days ceftriaxone and 5 days azithro) Singulair  Will need outpatient pulm follow up Seen by pulm on 3/10 who recommended QHS bipap as well as palliative care consultation if pt not improving.  Palliative following.  2.  Depression/anxiety Stable.  Continue current regimen of Celexa, BuSpar, Xanax as needed.  Outpatient follow-up.   3.  Hypertension Stable.   4.  Oral thrush/cold sores Patient with complaints of mouth pain and noted to have some whitish exudates on the tip of the tongue.  Patient on Cadence Minton slow prednisone taper.  Some clinical improvement on Magic mouthwash with lidocaine and nystatin swish and swallow.  Patient with cold sores on the lip.  Continue Valtrex 1000 mg 3 times daily to complete 7 day course.  5.  Debility Patient states she is significantly weak with inability to stand up on her own.  PT/OT.  Patient does not feel she will be able to manage at home.  Social work consult for skilled nursing facility placement.  Follow.  6.  Hypokalemia Replete.  # Left > Right Lower extremity edema: follow LLE [place]  # Thrombocytopenia: continue to monitor, since 3/24.  Pt currently on lovenox.  ? Degree of dilution as all counts down today?  DVT prophylaxis: lovenox Code Status: partial, no CPR or intubation, ok with bipap and meds Family Communication: none at bedside Disposition Plan: pending   Consultants:   Palliative care  Pulm  Procedures:   none  Antimicrobials: Anti-infectives (From admission, onward)   Start     Dose/Rate Route Frequency Ordered Stop  08/31/18 1145  valACYclovir (VALTREX) tablet 1,000 mg     1,000 mg Oral 3 times daily 08/31/18 1142 09/07/18 0959   08/14/18 1200  azithromycin (ZITHROMAX) 250 mg in dextrose 5 % 125 mL IVPB     250 mg 125 mL/hr over 60 Minutes Intravenous Every 24 hours 08/14/18 1146 08/18/18  1314   08/13/18 2200  cefTRIAXone (ROCEPHIN) 1 g in sodium chloride 0.9 % 100 mL IVPB     1 g 200 mL/hr over 30 Minutes Intravenous Every 24 hours 08/13/18 1743 08/14/18 2138   08/12/18 2200  cefTRIAXone (ROCEPHIN) 1 g in sodium chloride 0.9 % 100 mL IVPB  Status:  Discontinued     1 g 200 mL/hr over 30 Minutes Intravenous Every 24 hours 08/11/18 2243 08/12/18 0833   08/12/18 2200  cefTRIAXone (ROCEPHIN) 1 g in sodium chloride 0.9 % 100 mL IVPB  Status:  Discontinued     1 g 200 mL/hr over 30 Minutes Intravenous Every 24 hours 08/12/18 1507 08/13/18 1743   08/11/18 2045  cefTRIAXone (ROCEPHIN) 1 g in sodium chloride 0.9 % 100 mL IVPB     1 g 200 mL/hr over 30 Minutes Intravenous  Once 08/11/18 2044 08/11/18 2155   08/11/18 2045  azithromycin (ZITHROMAX) tablet 500 mg     500 mg Oral  Once 08/11/18 2044 08/11/18 2153     Subjective: SOB better this morning when she woke up, but still significant when she gets up to move about (SOB with getting to bedside commode)  Objective: Vitals:   09/01/18 2121 09/02/18 0524 09/02/18 0755 09/02/18 0803  BP:  103/61    Pulse:  75    Resp:  16    Temp:  97.8 F (36.6 C)    TempSrc:  Oral    SpO2: 100% 98% 93% 93%  Weight:      Height:        Intake/Output Summary (Last 24 hours) at 09/02/2018 1103 Last data filed at 09/02/2018 0937 Gross per 24 hour  Intake 480 ml  Output 501 ml  Net -21 ml   Filed Weights   08/11/18 1711 08/14/18 0918  Weight: 62.6 kg 63.6 kg    Examination:  General exam: Appears calm and comfortable  Respiratory system: Clear to auscultation. Respiratory effort normal. Cardiovascular system: S1 & S2 heard, RRR.  Gastrointestinal system: Abdomen is nondistended, soft and nontender.  Central nervous system: Alert and oriented. No focal neurological deficits. Extremities: L>R LEE Skin: No rashes, lesions or ulcers Psychiatry: Judgement and insight appear normal. Mood & affect appropriate.     Data Reviewed: I  have personally reviewed following labs and imaging studies  CBC: Recent Labs  Lab 08/28/18 0405 09/01/18 0911 09/02/18 0508  WBC 15.5* 15.7* 10.6*  NEUTROABS 14.6* 14.3* 8.8*  HGB 13.4 11.9* 10.3*  HCT 43.9 38.4 33.2*  MCV 100.5* 99.2 98.8  PLT 172 147* 433*   Basic Metabolic Panel: Recent Labs  Lab 08/28/18 0405 08/30/18 0528 08/31/18 0436 09/01/18 0911 09/02/18 0508  NA 139 140 138 136 139  K 4.2 3.2* 3.6 3.0* 3.8  CL 95* 93* 94* 93* 100  CO2 37* 39* 36* 35* 33*  GLUCOSE 186* 92 172* 146* 78  BUN 24* 23 19 20 15   CREATININE 0.49 0.51 0.47 0.43* 0.34*  CALCIUM 8.2* 8.0* 7.9* 8.0* 7.7*  MG  --   --   --  2.2  --    GFR: Estimated Creatinine Clearance: 60.9 mL/min (Rayden Scheper) (by C-G formula  based on SCr of 0.34 mg/dL (L)). Liver Function Tests: No results for input(s): AST, ALT, ALKPHOS, BILITOT, PROT, ALBUMIN in the last 168 hours. No results for input(s): LIPASE, AMYLASE in the last 168 hours. No results for input(s): AMMONIA in the last 168 hours. Coagulation Profile: No results for input(s): INR, PROTIME in the last 168 hours. Cardiac Enzymes: No results for input(s): CKTOTAL, CKMB, CKMBINDEX, TROPONINI in the last 168 hours. BNP (last 3 results) No results for input(s): PROBNP in the last 8760 hours. HbA1C: No results for input(s): HGBA1C in the last 72 hours. CBG: No results for input(s): GLUCAP in the last 168 hours. Lipid Profile: No results for input(s): CHOL, HDL, LDLCALC, TRIG, CHOLHDL, LDLDIRECT in the last 72 hours. Thyroid Function Tests: No results for input(s): TSH, T4TOTAL, FREET4, T3FREE, THYROIDAB in the last 72 hours. Anemia Panel: No results for input(s): VITAMINB12, FOLATE, FERRITIN, TIBC, IRON, RETICCTPCT in the last 72 hours. Sepsis Labs: No results for input(s): PROCALCITON, LATICACIDVEN in the last 168 hours.  No results found for this or any previous visit (from the past 240 hour(s)).       Radiology Studies: Vas [place] Lower Extremity  Venous (dvt)  Result Date: 09/02/2018  Lower Venous Study Indications: Edema, and SOB.  Limitations: Patient sitting up, could not lay back due to SOB. Performing Technologist: June Leap RDMS, RVT  Examination Guidelines: Nyari Olsson complete evaluation includes B-mode imaging, spectral Doppler, color Doppler, and power Doppler as needed of all accessible portions of each vessel. Bilateral testing is considered an integral part of Kyn[place]leigh [place]f complete examination. Limited examinations for reoccurring indications may be performed as noted.  Right Venous Findings: +---+---------------+---------+-----------+----------+-------+      Compressibility Phasicity Spontaneity Properties Summary  +---+---------------+---------+-----------+----------+-------+  CFV Full            Yes       Yes                             +---+---------------+---------+-----------+----------+-------+  Left Venous Findings: +---------+---------------+---------+-----------+----------+-------+            Compressibility Phasicity Spontaneity Properties Summary  +---------+---------------+---------+-----------+----------+-------+  CFV       Full            Yes       Yes                             +---------+---------------+---------+-----------+----------+-------+  SFJ       Full                                                      +---------+---------------+---------+-----------+----------+-------+  FV Prox   Full                                                      +---------+---------------+---------+-----------+----------+-------+  FV Mid    Full                                                      +---------+---------------+---------+-----------+----------+-------+  FV Distal Full                                                      +---------+---------------+---------+-----------+----------+-------+  PFV       Full                                                      +---------+---------------+---------+-----------+----------+-------+  POP       Full             Yes       Yes                             +---------+---------------+---------+-----------+----------+-------+  PTV       Full                                                      +---------+---------------+---------+-----------+----------+-------+  PERO      Full                                                      +---------+---------------+---------+-----------+----------+-------+    Summary: Right: No evidence of common femoral vein obstruction. Left: There is no evidence of deep vein thrombosis in the lower extremity. No cystic structure found in the popliteal fossa.  *See table(s) above for measurements and observations. Electronically signed by Curt Jews MD on 09/02/2018 at 10:55:33 AM.    Final         Scheduled Meds:  arformoterol  15 mcg Nebulization BID   budesonide (PULMICORT) nebulizer solution  0.5 mg Nebulization BID   busPIRone  7.5 mg Oral TID   calcium-vitamin D  1 tablet Oral Daily   Chlorhexidine Gluconate Cloth  6 each Topical Daily   citalopram  10 mg Oral QHS   enoxaparin (LOVENOX) injection  40 mg Subcutaneous QHS   guaiFENesin  1,200 mg Oral BID   ipratropium  2 spray Each Nare QID   ipratropium-albuterol  3 mL Nebulization TID   mouth rinse  15 mL Mouth Rinse BID   montelukast  10 mg Oral QHS   nystatin  5 mL Oral QID   pantoprazole  40 mg Oral Q0600   polyethylene glycol  17 g Oral BID   predniSONE  50 mg Oral Q breakfast   senna-docusate  1 tablet Oral BID   valACYclovir  1,000 mg Oral TID   Continuous Infusions:  sodium chloride Stopped (08/23/18 1758)     LOS: 22 days    Time spent: over 30 min    Fayrene Helper, MD [facility] Pager AMION  If 7PM-7AM, please contact night-coverage [email or web address] Password [facility] 09/02/2018, 11:03 AM

## 2018-09-02 NOTE — Progress Notes (Signed)
Provided SNF bed offers to pt and sister, they are reviewing. Pasrr still in manual review.  Sharren Bridge, MSW, LCSW Clinical Social Work 09/02/2018 [phone]

## 2018-09-02 NOTE — Progress Notes (Signed)
LE venous duplex       has been completed. Preliminary results can be found under CV proc through chart review. Moishe Schellenberg, BS, RDMS, RVT   

## 2018-09-03 DIAGNOSIS — J9622 Acute and chronic respiratory failure with hypercapnia: Secondary | ICD-10-CM | POA: Diagnosis not present

## 2018-09-03 DIAGNOSIS — R2689 Other abnormalities of gait and mobility: Secondary | ICD-10-CM | POA: Diagnosis not present

## 2018-09-03 DIAGNOSIS — R5381 Other malaise: Secondary | ICD-10-CM | POA: Diagnosis not present

## 2018-09-03 DIAGNOSIS — E876 Hypokalemia: Secondary | ICD-10-CM | POA: Diagnosis not present

## 2018-09-03 DIAGNOSIS — J9621 Acute and chronic respiratory failure with hypoxia: Secondary | ICD-10-CM | POA: Diagnosis not present

## 2018-09-03 DIAGNOSIS — F39 Unspecified mood [affective] disorder: Secondary | ICD-10-CM | POA: Diagnosis not present

## 2018-09-03 DIAGNOSIS — J9611 Chronic respiratory failure with hypoxia: Secondary | ICD-10-CM | POA: Diagnosis not present

## 2018-09-03 DIAGNOSIS — D6949 Other primary thrombocytopenia: Secondary | ICD-10-CM | POA: Diagnosis not present

## 2018-09-03 DIAGNOSIS — Z7401 Bed confinement status: Secondary | ICD-10-CM | POA: Diagnosis not present

## 2018-09-03 DIAGNOSIS — R1312 Dysphagia, oropharyngeal phase: Secondary | ICD-10-CM | POA: Diagnosis not present

## 2018-09-03 DIAGNOSIS — R2681 Unsteadiness on feet: Secondary | ICD-10-CM | POA: Diagnosis not present

## 2018-09-03 DIAGNOSIS — I10 Essential (primary) hypertension: Secondary | ICD-10-CM | POA: Diagnosis not present

## 2018-09-03 DIAGNOSIS — K0510 Chronic gingivitis, plaque induced: Secondary | ICD-10-CM | POA: Diagnosis not present

## 2018-09-03 DIAGNOSIS — J984 Other disorders of lung: Secondary | ICD-10-CM | POA: Diagnosis not present

## 2018-09-03 DIAGNOSIS — J441 Chronic obstructive pulmonary disease with (acute) exacerbation: Secondary | ICD-10-CM | POA: Diagnosis not present

## 2018-09-03 DIAGNOSIS — J9600 Acute respiratory failure, unspecified whether with hypoxia or hypercapnia: Secondary | ICD-10-CM | POA: Diagnosis not present

## 2018-09-03 DIAGNOSIS — R0602 Shortness of breath: Secondary | ICD-10-CM | POA: Diagnosis not present

## 2018-09-03 DIAGNOSIS — R278 Other lack of coordination: Secondary | ICD-10-CM | POA: Diagnosis not present

## 2018-09-03 DIAGNOSIS — M2550 Pain in unspecified joint: Secondary | ICD-10-CM | POA: Diagnosis not present

## 2018-09-03 DIAGNOSIS — R498 Other voice and resonance disorders: Secondary | ICD-10-CM | POA: Diagnosis not present

## 2018-09-03 DIAGNOSIS — R0902 Hypoxemia: Secondary | ICD-10-CM | POA: Diagnosis not present

## 2018-09-03 DIAGNOSIS — R41841 Cognitive communication deficit: Secondary | ICD-10-CM | POA: Diagnosis not present

## 2018-09-03 DIAGNOSIS — M6281 Muscle weakness (generalized): Secondary | ICD-10-CM | POA: Diagnosis not present

## 2018-09-03 DIAGNOSIS — D696 Thrombocytopenia, unspecified: Secondary | ICD-10-CM | POA: Diagnosis not present

## 2018-09-03 LAB — CBC
HCT: 35.3 % — ABNORMAL LOW (ref 36.0–46.0)
Hemoglobin: 10.5 g/dL — ABNORMAL LOW (ref 12.0–15.0)
MCH: 30.3 pg (ref 26.0–34.0)
MCHC: 29.7 g/dL — ABNORMAL LOW (ref 30.0–36.0)
MCV: 102 fL — ABNORMAL HIGH (ref 80.0–100.0)
Platelets: 143 10*3/uL — ABNORMAL LOW (ref 150–400)
RBC: 3.46 MIL/uL — ABNORMAL LOW (ref 3.87–5.11)
RDW: 13.2 % (ref 11.5–15.5)
WBC: 9.3 10*3/uL (ref 4.0–10.5)
nRBC: 0 % (ref 0.0–0.2)

## 2018-09-03 LAB — BASIC METABOLIC PANEL
Anion gap: 8 (ref 5–15)
BUN: 18 mg/dL (ref 8–23)
CHLORIDE: 98 mmol/L (ref 98–111)
CO2: 32 mmol/L (ref 22–32)
CREATININE: 0.46 mg/dL (ref 0.44–1.00)
Calcium: 7.7 mg/dL — ABNORMAL LOW (ref 8.9–10.3)
GFR calc Af Amer: >60 mL/min (ref >60)
GFR calc non Af Amer: >60 mL/min (ref >60)
Glucose, Bld: 134 mg/dL — ABNORMAL HIGH (ref 70–99)
Potassium: 3.2 mmol/L — ABNORMAL LOW (ref 3.5–5.1)
SODIUM: 138 mmol/L (ref 135–145)

## 2018-09-03 LAB — MAGNESIUM: Magnesium: 2.2 mg/dL (ref 1.7–2.4)

## 2018-09-03 MED ORDER — POLYETHYLENE GLYCOL 3350 17 G PO PACK: 17.0000 g | PACK | Freq: Two times a day (BID) | ORAL | 0 refills | Status: DC

## 2018-09-03 MED ORDER — POTASSIUM CHLORIDE CRYS ER 20 MEQ PO TBCR
40.0000 meq | EXTENDED_RELEASE_TABLET | Freq: Once | ORAL | Status: AC
  Administered 2018-09-03: 40 meq via ORAL
  Filled 2018-09-03: qty 2

## 2018-09-03 MED ORDER — VALACYCLOVIR HCL 1 G PO TABS: 1000.0000 mg | ORAL_TABLET | Freq: Three times a day (TID) | ORAL | 0 refills | Status: AC

## 2018-09-03 MED ORDER — PREDNISONE 10 MG PO TABS: ORAL_TABLET | ORAL | 0 refills | Status: AC

## 2018-09-03 MED ORDER — PANTOPRAZOLE SODIUM 40 MG PO TBEC: 40.0000 mg | DELAYED_RELEASE_TABLET | Freq: Every day | ORAL | 0 refills | Status: DC

## 2018-09-03 MED ORDER — BUSPIRONE HCL 7.5 MG PO TABS: 7.5000 mg | ORAL_TABLET | Freq: Three times a day (TID) | ORAL | 0 refills | Status: AC

## 2018-09-03 MED ORDER — ALPRAZOLAM 0.5 MG PO TABS: 0.5000 mg | ORAL_TABLET | Freq: Three times a day (TID) | ORAL | 0 refills | Status: AC | PRN

## 2018-09-03 MED ORDER — SENNOSIDES-DOCUSATE SODIUM 8.6-50 MG PO TABS: 1.0000 | ORAL_TABLET | Freq: Two times a day (BID) | ORAL | 0 refills | Status: AC

## 2018-09-03 MED ORDER — ALPRAZOLAM 0.5 MG PO TABS: 0.5000 mg | ORAL_TABLET | Freq: Three times a day (TID) | ORAL | 0 refills | Status: DC | PRN

## 2018-09-03 NOTE — Progress Notes (Signed)
Occupational Therapy Treatment Patient Details Name: Courtney Ayers MRN: [id number] DOB: Oct 25, 1945 Today's Date: 09/03/2018    History of present illness pt was admitted for acute COPD exacerbation.  PMH:  OA and depression   OT comments  Pt with good participation this day. Anxious about rehab  Follow Up Recommendations  SNF    Equipment Recommendations  3 in 1 bedside commode    Recommendations for Other Services      Precautions / Restrictions Precautions Precautions: Fall Precaution Comments: monitor O2, high anxiety(but improvong) Restrictions Weight Bearing Restrictions: No       Mobility Bed Mobility Overal bed mobility: Needs Assistance Bed Mobility: Supine to Sit     Supine to sit: Mod assist;Min assist        Transfers Overall transfer level: Needs assistance Equipment used: Rolling walker (2 wheeled) Transfers: Sit to/from [facility] Sit to Stand: Mod assist;Min assist;+2 safety/equipment Stand pivot transfers: Mod assist;Max assist;+2 safety/equipment                ADL either performed or assessed with clinical judgement   ADL Overall ADL's : Needs assistance/impaired     Grooming: Set up;Sitting;Brushing hair;Wash/dry face;Oral care                   Toilet Transfer: Min guard;Stand-pivot;+2 for safety/equipment   Toileting- Clothing Manipulation and Hygiene: Moderate assistance;Sit to/from stand;+2 for safety/equipment         General ADL Comments: pt agreeable to OOB.  Pt with many questions about rehab and what she will need. OT provided info and reassurance     Vision Patient Visual Report: No change from baseline            Cognition Arousal/Alertness: Awake/alert Behavior During Therapy: WFL for tasks assessed/performed Overall Cognitive Status: Within Functional Limits for tasks assessed                                                     Pertinent Vitals/ Pain        Pain Assessment: No/denies pain         Frequency  Min 2X/week        Progress Toward Goals  OT Goals(current goals can now be found in the care plan section)  Progress towards OT goals: Progressing toward goals     Plan Discharge plan remains appropriate       AM-PAC OT "6 Clicks" Daily Activity     Outcome Measure   Help from another person eating meals?: None Help from another person taking care of personal grooming?: A Little Help from another person toileting, which includes using toliet, bedpan, or urinal?: A Lot Help from another person bathing (including washing, rinsing, drying)?: A Little Help from another person to put on and taking off regular upper body clothing?: A Little Help from another person to put on and taking off regular lower body clothing?: A Lot 6 Click Score: 17    End of Session    OT Visit Diagnosis: Muscle weakness (generalized) (M62.81);Unsteadiness on feet (R26.81)   Activity Tolerance Patient tolerated treatment well   Patient Left in chair;with call bell/phone within reach;with nursing/sitter in room   Nurse Communication Mobility status        Time: 9833-8250 OT Time Calculation (min): 15 min  Charges: OT General Charges $  OT Visit: 1 Visit OT Treatments $Self Care/Home Management : 8-22 mins  Kari Baars, [place] [facility] [phone] Office- [phone], Edwena Felty D 09/03/2018, 11:26 AM

## 2018-09-03 NOTE — Progress Notes (Signed)
Attempted to call report to [facility] twice, no answer. Gave report to [place] from [place].

## 2018-09-03 NOTE — Discharge Summary (Signed)
Physician Discharge Summary  Courtney Ayers:[id number] DOB: [birth year] DOA: 08/11/2018  PCP: [name], MD  Admit date: 08/11/2018 Discharge date: 09/03/2018  Time spent: 40 minutes  Recommendations for Outpatient Follow-up:  1. Follow outpatient CBC/CMP 2. Follow up with pulmonology as outpatient 3. Follow up outpatient sleep study 4. Was on nightly BIPAP here, but has not required past few nights, would recommend outpatient sleep study  5. Follow steroid taper    Discharge Diagnoses:  Principal Problem:   Acute on chronic respiratory failure with hypoxia and hypercapnia (HCC) Active Problems:   Anxiety   Shortness of breath   COPD exacerbation (HCC)   COPD with acute exacerbation (HCC)   HTN (hypertension)   Tachypnea   Debility   Palliative care by specialist   DNR (do not resuscitate)   Constipation   Discharge Condition: stable  Diet recommendation: heart healthy  Filed Weights   08/11/18 1711 08/14/18 0918  Weight: 62.6 kg 63.6 kg    History of present illness:  HPI per Dr. Charolotte Eke Courtney Ayers 73 y.o.femalewith medical history significant ofCOPD, depression, allergic rhinitis, hypertension, who presented with significant shortness of breath and cough. Symptoms started about Courtney Ayers week ago and progressively getting worse.Patient follows up with pulmonology.She has been on multiple pulmonary medications that seem to have worked for her until now. She was feeling weak. She took doxycycline also but get some stomach upset. She felt congested with her sinuses and try to treat this symptomatically. She continues to have shortness of breath. She decided to come to the emergency room where she was having significant tachypnea with Krishav Mamone temperature of 101. No pneumonia on x-ray. She was however having bilateral wheezing consistent with acute exacerbation of COPD. Viral screen so far negative. Patient has been admitted with acute exacerbation of  COPD.[name]  ED Course:Temperature 101.1 blood pressure 189/82, pulse of 116, respirate 39 oxygen sat 92% on 2 L. Patient CBC and chemistry appeared to be within normal except for troponin 0 0.05 glucose 123. Chest x-ray showed no acute findings. Influenza AMB negative. Patient being admitted with acute exacerbation COPD.  She was admitted for Courtney Ayers COPD exacerbation.  She's had Courtney Ayers prolonged course requiring bipap, abx, steroids, nebs.  She's improved and now close to her baseline.  Plan for discharge today to SNF for rehab.  See below for additional details  Hospital Course:  #1 acute on chronic hypoxic respiratory failure secondary to acute COPDexacerbation Improved, but still with significant SOB at baseline O2 currently close to baseline O2 (on 2-3) Will taper steroids at discharge Continue home inhalers (incruse ellipta, advair) Not currently on any abx, s/p completion of course (4 days ceftriaxone and 5 days azithro) Singulair  Will need outpatient pulm follow up Seen by pulm on 3/10 who recommended QHS bipap as well as palliative care consultation if pt not improving.  Palliative following.  Last palliative note discussed possibly decreasing need for bipap at night.  Pt has been off bipap last few nights.  Would follow up outpatient sleep study to look at continued need.  2. Depression/anxiety Stable.Continue current regimen of Celexa, BuSpar, Xanax as needed. Outpatient follow-up.   3. Hypertension Stable.   4. Oral thrush/cold sores Patient with complaints of mouth pain and noted to have some whitish exudates on the tip of the tongue. Patient on Kieli Golladay slow prednisone taper. Some clinical improvement on Magic mouthwash with lidocaine and nystatin swish and swallow. Patient with cold sores on the lip. ContinueValtrex 1000  mg 3 times daily to complete 7 day course.  5. Debility Patient states she is significantly weak with inability to stand up on her own. PT/OT. Patient  does not feel she will be able to manage at home. Social work consult for skilled nursing facility placement. Follow.  6. Hypokalemia Replete.  # Left > Right Lower extremity edema: follow LLE [place] (negative for DVT)  # Thrombocytopenia: improved today, continue to follow outpatient   Previously partial code (no CPR/intubation, but ok with bipap/meds).  Discussed and clarified today and she is DNR.  Procedures: none  Consultations:  Palliative care  pulm  Discharge Exam: Vitals:   09/03/18 0808 09/03/18 0812  BP:    Pulse:    Resp:    Temp:    SpO2: 98% 98%   Feels Khambrel Amsden bit anxious.   Otherwise, feels around baseline.  General: No acute distress. Cardiovascular: Heart sounds show Jawanza Zambito regular rate, and rhythm. Lungs: Clear to auscultation bilaterally  Abdomen: Soft, nontender, nondistended  Neurological: Alert and oriented 3. Moves all extremities 4. Cranial nerves II through XII grossly intact. Skin: Warm and dry. No rashes or lesions. Extremities: No clubbing or cyanosis. No edema.  Psychiatric: Mood and affect are normal. Insight and judgment are appropriate.  Discharge Instructions   Discharge Instructions    Call MD for:  difficulty breathing, headache or visual disturbances   Complete by:  As directed    Call MD for:  extreme fatigue   Complete by:  As directed    Call MD for:  persistant dizziness or light-headedness   Complete by:  As directed    Call MD for:  persistant nausea and vomiting   Complete by:  As directed    Call MD for:  redness, tenderness, or signs of infection (pain, swelling, redness, odor or green/yellow discharge around incision site)   Complete by:  As directed    Call MD for:  severe uncontrolled pain   Complete by:  As directed    Call MD for:  temperature >100.4   Complete by:  As directed    Diet - low sodium heart healthy   Complete by:  As directed    Discharge instructions   Complete by:  As directed    You were seen  for Courtney Ayers COPD exacerbation.  You gradually improved with antibiotics, steroids, bipap, and inhalers.    We're sending you home on Courtney Ayers steroid taper.    You should have Courtney Ayers sleep study as an outpatient.  Please follow up with your PCP regarding this.  You should follow up with the lung doctors as an outpatient.  Return for new, recurrent, or worsening symptoms.  Please ask your PCP to request records from this hospitalization so they know what was done and what the next steps will be.   Increase activity slowly   Complete by:  As directed      Allergies as of 09/03/2018   No Known Allergies     Medication List    STOP taking these medications   doxycycline 100 MG tablet Commonly known as:  VIBRA-TABS     TAKE these medications   albuterol (2.5 MG/3ML) 0.083% nebulizer solution Commonly known as:  PROVENTIL USE 1 VIAL VIA NEBULIZER EVERY 2 HOURS AS NEEDED FOR SHORTNESS OF BREATH What changed:  See the new instructions.   albuterol 108 (90 Base) MCG/ACT inhaler Commonly known as:  PROVENTIL HFA;VENTOLIN HFA Inhale 2 puffs into the lungs every 6 (six) hours as  needed for wheezing or shortness of breath. What changed:  Another medication with the same name was changed. Make sure you understand how and when to take each.   ALPRAZolam 0.5 MG tablet Commonly known as:  XANAX Take 1 tablet (0.5 mg total) by mouth 3 (three) times daily as needed for up to 3 days for anxiety.   busPIRone 7.5 MG tablet Commonly known as:  BUSPAR Take 1 tablet (7.5 mg total) by mouth 3 (three) times daily for 30 days.   calcium-vitamin D 500-200 MG-UNIT tablet Take 1 tablet by mouth daily.   citalopram 10 MG tablet Commonly known as:  CELEXA TAKE 1 TABLET EVERY DAY What changed:    how much to take  how to take this  when to take this  additional instructions   fluticasone 50 MCG/ACT nasal spray Commonly known as:  Flonase Place 2 sprays into both nostrils daily.   Fluticasone-Salmeterol  250-50 MCG/DOSE Aepb Commonly known as:  Advair Diskus INHALE 1 PUFF INTO THE LUNGS EVERY 12 HOURS   guaiFENesin 600 MG 12 hr tablet Commonly known as:  MUCINEX Take 1,200 mg by mouth 2 (two) times daily.   montelukast 10 MG tablet Commonly known as:  SINGULAIR TAKE 1 TABLET BY MOUTH EVERYDAY AT BEDTIME What changed:  See the new instructions.   OXYGEN Inhale 2 L into the lungs.   oxymetazoline 0.05 % nasal spray Commonly known as:  AFRIN Place 1 spray into both nostrils 2 (two) times daily as needed for congestion.   pantoprazole 40 MG tablet Commonly known as:  PROTONIX Take 1 tablet (40 mg total) by mouth daily at 6 (six) AM for 30 days. Start taking on:  September 04, 2018   polyethylene glycol packet Commonly known as:  MIRALAX / GLYCOLAX Take 17 g by mouth 2 (two) times daily.   predniSONE 10 MG tablet Commonly known as:  DELTASONE Take 4 tablets (40 mg total) by mouth daily with breakfast for 5 days, THEN 3 tablets (30 mg total) daily with breakfast for 5 days, THEN 2 tablets (20 mg total) daily with breakfast for 5 days, THEN 1 tablet (10 mg total) daily with breakfast for 5 days. Start taking on:  September 03, 2018 What changed:    medication strength  See the new instructions.   senna-docusate 8.6-50 MG tablet Commonly known as:  Senokot-S Take 1 tablet by mouth 2 (two) times daily for 30 days.   sodium chloride HYPERTONIC 3 % nebulizer solution Take by nebulization 2 (two) times daily as needed for other.   umeclidinium bromide 62.5 MCG/INH Aepb Commonly known as:  Incruse Ellipta Inhale 1 puff into the lungs daily.   valACYclovir 1000 MG tablet Commonly known as:  VALTREX Take 1 tablet (1,000 mg total) by mouth 3 (three) times daily for 4 days.      No Known Allergies    The results of significant diagnostics from this hospitalization (including imaging, microbiology, ancillary and laboratory) are listed below for reference.    Significant Diagnostic  Studies: Dg Chest 2 View  Result Date: 08/11/2018 CLINICAL DATA:  Worsening shortness of breath over the past 48 hours. COPD. Ex-smoker. Cough. EXAM: CHEST - 2 VIEW COMPARISON:  Chest CT dated 02/03/2018 and chest radiographs dated 06/04/2016. FINDINGS: Normal sized heart. Stable marked hyperexpansion of the lungs with bilateral bullous changes and linear scarring. No radiographically visible lung nodules, better seen on the previous CT. Unremarkable bones. One of the previously demonstrated breast implants is visible  on the lateral view. IMPRESSION: No acute disease. Stable marked changes of COPD. Electronically Signed   By: Claudie Revering M.D.   On: 08/11/2018 18:51   Dg Chest Port 1 View  Result Date: 08/14/2018 CLINICAL DATA:  Acute on chronic respiratory failure with hypoxia and hypercapnia. EXAM: PORTABLE CHEST 1 VIEW COMPARISON:  08/11/2018. FINDINGS: Cardiac silhouette is normal in size. No mediastinal or hilar masses or evidence of adenopathy. Lungs are hyperexpanded. Stable scarring most evident in the right upper lobe. No evidence of pneumonia or pulmonary edema. No pleural effusion or pneumothorax. Skeletal structures are grossly intact. IMPRESSION: 1. No acute cardiopulmonary disease. 2. Advanced COPD with chronic lung scarring. Electronically Signed   By: Lajean Manes M.D.   On: 08/14/2018 10:27   Vas [place] Lower Extremity Venous (dvt)  Result Date: 09/02/2018  Lower Venous Study Indications: Edema, and SOB.  Limitations: Patient sitting up, could not lay back due to SOB. Performing Technologist: June Leap RDMS, RVT  Examination Guidelines: Teneisha Gignac complete evaluation includes B-mode imaging, spectral Doppler, color Doppler, and power Doppler as needed of all accessible portions of each vessel. Bilateral testing is considered an integral part of Trinitey Roache complete examination. Limited examinations for reoccurring indications may be performed as noted.  Right Venous Findings:  +---+---------------+---------+-----------+----------+-------+    CompressibilityPhasicitySpontaneityPropertiesSummary +---+---------------+---------+-----------+----------+-------+ CFVFull           Yes      Yes                          +---+---------------+---------+-----------+----------+-------+  Left Venous Findings: +---------+---------------+---------+-----------+----------+-------+          CompressibilityPhasicitySpontaneityPropertiesSummary +---------+---------------+---------+-----------+----------+-------+ CFV      Full           Yes      Yes                          +---------+---------------+---------+-----------+----------+-------+ SFJ      Full                                                 +---------+---------------+---------+-----------+----------+-------+ FV Prox  Full                                                 +---------+---------------+---------+-----------+----------+-------+ FV Mid   Full                                                 +---------+---------------+---------+-----------+----------+-------+ FV DistalFull                                                 +---------+---------------+---------+-----------+----------+-------+ PFV      Full                                                 +---------+---------------+---------+-----------+----------+-------+  POP      Full           Yes      Yes                          +---------+---------------+---------+-----------+----------+-------+ PTV      Full                                                 +---------+---------------+---------+-----------+----------+-------+ PERO     Full                                                 +---------+---------------+---------+-----------+----------+-------+    Summary: Right: No evidence of common femoral vein obstruction. Left: There is no evidence of deep vein thrombosis in the lower extremity. No cystic structure  found in the popliteal fossa.  *See table(s) above for measurements and observations. Electronically signed by Curt Jews MD on 09/02/2018 at 10:55:33 AM.    Final     Microbiology: No results found for this or any previous visit (from the past 240 hour(s)).   Labs: Basic Metabolic Panel: Recent Labs  Lab 08/30/18 0528 08/31/18 0436 09/01/18 0911 09/02/18 0508 09/03/18 0440  NA 140 138 136 139 138  K 3.2* 3.6 3.0* 3.8 3.2*  CL 93* 94* 93* 100 98  CO2 39* 36* 35* 33* 32  GLUCOSE 92 172* 146* 78 134*  BUN 23 19 20 15 18   CREATININE 0.51 0.47 0.43* 0.34* 0.46  CALCIUM 8.0* 7.9* 8.0* 7.7* 7.7*  MG  --   --  2.2  --  2.2   Liver Function Tests: No results for input(s): AST, ALT, ALKPHOS, BILITOT, PROT, ALBUMIN in the last 168 hours. No results for input(s): LIPASE, AMYLASE in the last 168 hours. No results for input(s): AMMONIA in the last 168 hours. CBC: Recent Labs  Lab 08/28/18 0405 09/01/18 0911 09/02/18 0508 09/03/18 0440  WBC 15.5* 15.7* 10.6* 9.3  NEUTROABS 14.6* 14.3* 8.8*  --   HGB 13.4 11.9* 10.3* 10.5*  HCT 43.9 38.4 33.2* 35.3*  MCV 100.5* 99.2 98.8 102.0*  PLT 172 147* 125* 143*   Cardiac Enzymes: No results for input(s): CKTOTAL, CKMB, CKMBINDEX, TROPONINI in the last 168 hours. BNP: BNP (last 3 results) Recent Labs    08/11/18 1729  BNP 111.7*    ProBNP (last 3 results) No results for input(s): PROBNP in the last 8760 hours.  CBG: No results for input(s): GLUCAP in the last 168 hours.     Signed:  Fayrene Helper MD.  [facility] 09/03/2018, 12:11 PM

## 2018-09-03 NOTE — TOC Transition Note (Signed)
Transition of Care [facility]) - CM/SW Discharge Note   Patient Details  Name: ELENE DOWNUM MRN: [id number] Date of Birth: [birth year]  [facility] CM/SW Contact:  Nila Nephew, [place] Phone Number: [phone] 09/03/2018, 9:37 AM   Clinical Narrative:   Pt admitting to [facility] room 207-P, report [phone]. Pt informed her sister.  [facility] transporting.    Final next level of care: Skilled Nursing Facility Barriers to Discharge: No Barriers Identified   Patient Goals and CMS Choice Patient states their goals for this hospitalization and ongoing recovery are:: "I was getting around fine before" CMS Medicare.gov Compare Post Acute Care list provided to:: Patient Choice offered to / list presented to : (will provide to pt and sister once list available)  Discharge Placement PASRR number recieved: (PASRR waived due to pandemic)            Patient chooses bed at: [facility] Patient to be transferred to facility by: [facility] Name of family member notified: sister Arbie Cookey Patient and family notified of of transfer: 09/03/18  Discharge Plan and Services     Post Acute Care Choice: [place]                    Social Determinants of Health (SDOH) Interventions     Readmission Risk Interventions No flowsheet data found.

## 2018-09-04 DIAGNOSIS — E876 Hypokalemia: Secondary | ICD-10-CM | POA: Diagnosis not present

## 2018-09-04 DIAGNOSIS — K0510 Chronic gingivitis, plaque induced: Secondary | ICD-10-CM | POA: Diagnosis not present

## 2018-09-04 DIAGNOSIS — F39 Unspecified mood [affective] disorder: Secondary | ICD-10-CM | POA: Diagnosis not present

## 2018-09-04 DIAGNOSIS — D6949 Other primary thrombocytopenia: Secondary | ICD-10-CM | POA: Diagnosis not present

## 2018-09-04 DIAGNOSIS — I10 Essential (primary) hypertension: Secondary | ICD-10-CM | POA: Diagnosis not present

## 2018-09-04 DIAGNOSIS — J441 Chronic obstructive pulmonary disease with (acute) exacerbation: Secondary | ICD-10-CM | POA: Diagnosis not present

## 2018-09-04 DIAGNOSIS — R5381 Other malaise: Secondary | ICD-10-CM | POA: Diagnosis not present

## 2018-09-08 ENCOUNTER — Telehealth: Payer: Self-pay | Admitting: Pulmonary Disease

## 2018-09-08 NOTE — Telephone Encounter (Signed)
Patient was at [facility] for about 3.5 weeks for COPD and she is now at [facility] and her sister want you to know patient was giving Xanax and Buspar to help with her anxiety while she was at [facility]. The physician at [facility] has discontinue her Buspar which has the patient is concerned because she needs something to control her anxiety and Buspar was helping a lot. She is hoping to get an order to continue Buspar and increase Citalopram because 10 mg was not helping her.   Please advise

## 2018-09-08 NOTE — Telephone Encounter (Signed)
Pt was advised Courtney Ayers 

## 2018-09-08 NOTE — Telephone Encounter (Signed)
Returned call to [facility] emergency contact. She states patient's Buspar was d/c by facility provider at [facility]. She (pt) wanted to know if her Celexa could be increased as she gets more anxious her breathing is labored. Advised to contact pcp Dr. Redmond School who is the prescriber of Celexa. She verbalized understanding. Nothing further needed.

## 2018-09-08 NOTE — Telephone Encounter (Signed)
The physician at [facility] place will need to help with that.

## 2018-09-15 DIAGNOSIS — J984 Other disorders of lung: Secondary | ICD-10-CM | POA: Diagnosis not present

## 2018-09-15 DIAGNOSIS — K0510 Chronic gingivitis, plaque induced: Secondary | ICD-10-CM | POA: Diagnosis not present

## 2018-09-16 ENCOUNTER — Telehealth: Payer: Self-pay | Admitting: Pulmonary Disease

## 2018-09-16 ENCOUNTER — Other Ambulatory Visit: Payer: Self-pay | Admitting: *Deleted

## 2018-09-16 NOTE — Patient Outreach (Signed)
[facility] Care Management  09/16/2018  DARNELLA ZEITER 04/30/1946 [id number]   Member currently at [facility] for rehab. Writer following for potential [facility] Care Management needs. Per chart review, member has a history of COPD, HTN, depression, and anxiety.   Will continue to follow along and collaborate with [facility] UM and facility regarding disposition plans and progression.  Will make necessary [place] referrals if needed.    Marthenia Rolling, MSN-Ed, RN,BSN [place] Acute Care Coordinator [phone]

## 2018-09-16 NOTE — Telephone Encounter (Signed)
Called and spokes to pt's sister, Arbie Cookey. Arbie Cookey states the pt may not be discharged til next week or after but may need a wheelchair, walker, and O2 tank cady. I advised Arbie Cookey that [facility] should assess pt's needs at the time of discharge and help obtain the items needed for home. I also informed Arbie Cookey that if that is an issue at the time of discharge to then call us back. Arbie Cookey verbalized understanding and denied any further questions or concerns at this time.

## 2018-09-17 ENCOUNTER — Other Ambulatory Visit: Payer: Self-pay | Admitting: *Deleted

## 2018-09-17 DIAGNOSIS — K0510 Chronic gingivitis, plaque induced: Secondary | ICD-10-CM | POA: Diagnosis not present

## 2018-09-17 DIAGNOSIS — J984 Other disorders of lung: Secondary | ICD-10-CM | POA: Diagnosis not present

## 2018-09-17 DIAGNOSIS — I10 Essential (primary) hypertension: Secondary | ICD-10-CM | POA: Diagnosis not present

## 2018-09-17 DIAGNOSIS — F39 Unspecified mood [affective] disorder: Secondary | ICD-10-CM | POA: Diagnosis not present

## 2018-09-17 DIAGNOSIS — D696 Thrombocytopenia, unspecified: Secondary | ICD-10-CM | POA: Diagnosis not present

## 2018-09-17 NOTE — Patient Outreach (Signed)
[place] [facility]) Care Management  09/17/2018  SHARNAY CASHION July 15, 1945 [id number]  [facility] Post Acute Coordinator follow up for potential [place] Management services.  Member remains at [facility] for therapy.  Attempted to reach member via cell at [phone] to discuss [place] Management follow up. No answer. HIPPA compliant voicemail left to request call back.  Telephone call made to member's sister Arbie Cookey at [phone] as she was listed in chart as primary contact. Verified member's identifiers.  Arbie Cookey confirms Ms. Rudie remains at SNF. States member is supposed to discharge home tomorrow.  Arbie Cookey reports member lives with her two 86 year old twin daughters. States one daughter is at a part-time Presenter, broadcasting and also works at [employer]. States the other twin daughter works for [employer] but is at home now due to the pandemic effects on business. States member is hopeful that her daughters will be of more help since they will be home more.  Arbie Cookey lives about 1 mile away and provides support to her member.  Arbie Cookey states she thinks [place] [facility] will be a good program for member. Asked for writer to provide the website information for [facility] as well.   Discussed that writer will continue to try to reach Ms. Vanderhoff to discuss [facility] CM follow up post SNF discharge.   Marthenia Rolling, MSN-Ed, RN,BSN [place] Acute Care Coordinator [phone]

## 2018-09-21 ENCOUNTER — Other Ambulatory Visit: Payer: Self-pay | Admitting: *Deleted

## 2018-09-21 DIAGNOSIS — J441 Chronic obstructive pulmonary disease with (acute) exacerbation: Secondary | ICD-10-CM

## 2018-09-21 NOTE — Patient Outreach (Signed)
[place] [facility]) Care Management  09/21/2018  ELISHEBA MCDONNELL 28-May-1946 [id number]    [facility] Post Acute Care Coordinator follow up.  Ms. Mayden likely to discharge from [facility] very soon. Per [facility] UM report, Ms. Raupp did not win her appeal for discharge from SNF.  Due to visitor restrictions, writer unable to meet member at facility to discuss [place] Management services.  Telephonic  unsuccessful attempt was made to reach member to discuss [place] Management. However, Probation officer did speak with member's sister on 09/17/2018. Please see encounter notes for further details.   Will make referral to [facility] for follow up for COPD and complex case management.  Marthenia Rolling, MSN-Ed, RN,BSN [place] Acute Care Cooordinator [phone]

## 2018-09-22 ENCOUNTER — Telehealth: Payer: Self-pay

## 2018-09-22 DIAGNOSIS — J9611 Chronic respiratory failure with hypoxia: Secondary | ICD-10-CM | POA: Diagnosis not present

## 2018-09-22 DIAGNOSIS — Z9981 Dependence on supplemental oxygen: Secondary | ICD-10-CM | POA: Diagnosis not present

## 2018-09-22 DIAGNOSIS — J441 Chronic obstructive pulmonary disease with (acute) exacerbation: Secondary | ICD-10-CM | POA: Diagnosis not present

## 2018-09-22 DIAGNOSIS — Z87891 Personal history of nicotine dependence: Secondary | ICD-10-CM | POA: Diagnosis not present

## 2018-09-22 NOTE — Telephone Encounter (Signed)
[facility] is requesting verbal for PT, OT, speech, skilled and home nursing q week for 9 weeks. Please advise [facility]

## 2018-09-23 ENCOUNTER — Telehealth: Payer: Self-pay | Admitting: Family Medicine

## 2018-09-23 DIAGNOSIS — Z9981 Dependence on supplemental oxygen: Secondary | ICD-10-CM | POA: Diagnosis not present

## 2018-09-23 DIAGNOSIS — J9611 Chronic respiratory failure with hypoxia: Secondary | ICD-10-CM | POA: Diagnosis not present

## 2018-09-23 DIAGNOSIS — J441 Chronic obstructive pulmonary disease with (acute) exacerbation: Secondary | ICD-10-CM | POA: Diagnosis not present

## 2018-09-23 DIAGNOSIS — Z87891 Personal history of nicotine dependence: Secondary | ICD-10-CM | POA: Diagnosis not present

## 2018-09-23 NOTE — Telephone Encounter (Signed)
Received a call from [facility] with [employer]. He is calling for verbal orders for occupational therapy. He is requesting   One visit a week for one week Two visits a week for two weeks One visit a week for one week  Erlene Quan can be reached at [phone].

## 2018-09-23 NOTE — Telephone Encounter (Signed)
ok 

## 2018-09-23 NOTE — Telephone Encounter (Signed)
Ok

## 2018-09-24 ENCOUNTER — Other Ambulatory Visit: Payer: Self-pay | Admitting: *Deleted

## 2018-09-24 ENCOUNTER — Encounter: Payer: Self-pay | Admitting: *Deleted

## 2018-09-24 NOTE — Telephone Encounter (Signed)
lvm to advise ok. [place]

## 2018-09-24 NOTE — Patient Outreach (Signed)
Courtney Courtney  09/24/2018  Courtney Courtney 1945/09/06 [id number]  Transition of care post d/c SNF on 4/11  Spoke with RN and reintroduced [facility] and the purpose for today's call. Pt available to discuss services at this time. RN inquired further on pt's Courtney Courtney. Pt report all issues and how she is managing her needs. States she has a supportive family with her spouse and daughter in the home to assist if needed. States she is limited with her ADL. Therefore her daughter is assisting in this area. Along with meal preparations. Pt declines any needs related to her ongoing medication administration and no needed refills at this time. Pt indicates she would contact her provided directly to arrange the post-op visit if needed and declined the offer for assistance in this area. Pt has DME with a pending deliver of a rollator. States [facility] with [facility] is involved for speech, PT and RN. No acute needs however pt has agreed to participate with [facility] for ongoing transition of care call and services. Program discussed and plan of care generated with discussed goals and interventions that pt feels she is able to adhere to at this time. Will discuss in detail and offer any needed resources or assistance at this time. Will follow up next week and continue to inquire on pt's progress with the interventions discussed today.  THN CM Care Plan Problem One     Most Recent Value  Care Plan Problem One  Recent SNF Courtney   Role Documenting the Problem One  Care Courtney [place] for Problem One  Active  THN Long Term Goal   Pt will not re-enter the SNF within the next 90 days.  THN Long Term Goal Start Date  09/24/18  Interventions for Problem One Long Term Goal  Will discuss prevention measures and use of DME to lower the risk of re-admission. Will strongy encouraged pt to participate with the prescribed therapist with  [facility] involve to work toward this goal.   [facility] CM Short Term Goal #1   Adherence to all medical appointments over the next 30 days.  THN CM Short Term Goal #1 Start Date  09/24/18  Interventions for Short Term Goal #1  Will strongly encouraged pt to contact her provider to make arrangements for her post-op follow visit with her primary provider (pt has agreed to call her provider).  THN CM Short Term Goal #2   Adherence to all Courtney medications within the next 30 days.  THN CM Short Term Goal #2 Start Date  09/24/18  Interventions for Short Term Goal #2  Discussed pt's medications and offered to review however declined. Strongly encouraged pt to comply with all her prescribed medications and follow up once again with her provider on to verify all medications and make any needed changes for her ongoing recovery. Will introduce pharmacy services is ability to assist further with any barriers or education on medications (verbalized an understanding)      [name], RN Care Courtney Courtney [place] Office [phone]

## 2018-09-24 NOTE — Patient Outreach (Signed)
[place] [facility]) Care Management  09/24/2018  Courtney Ayers 16-Jul-1945 [id number]    RN spoke with pt today and verified identifiers and the purpose for today's call. Pt receptive however requested a call back later today at a designated time.  Plan: RN will follow up later today and further engage on pt's recent discharge from the SNF.   [name], RN Care Management Coordinator [place] Office [phone]

## 2018-09-24 NOTE — Telephone Encounter (Signed)
lvm [place]

## 2018-09-25 ENCOUNTER — Telehealth: Payer: Self-pay | Admitting: Internal Medicine

## 2018-09-25 DIAGNOSIS — J441 Chronic obstructive pulmonary disease with (acute) exacerbation: Secondary | ICD-10-CM | POA: Diagnosis not present

## 2018-09-25 DIAGNOSIS — Z9981 Dependence on supplemental oxygen: Secondary | ICD-10-CM | POA: Diagnosis not present

## 2018-09-25 DIAGNOSIS — J9611 Chronic respiratory failure with hypoxia: Secondary | ICD-10-CM | POA: Diagnosis not present

## 2018-09-25 DIAGNOSIS — Z87891 Personal history of nicotine dependence: Secondary | ICD-10-CM | POA: Diagnosis not present

## 2018-09-25 NOTE — Telephone Encounter (Signed)
Amber from [facility] called and wants Verbal PT orders for 2 times a week for 4 weeks and then 1 time a week for 4 weeks.   Please call amber back for verbal ok [phone]

## 2018-09-26 NOTE — Telephone Encounter (Signed)
ok 

## 2018-09-28 ENCOUNTER — Other Ambulatory Visit: Payer: Self-pay

## 2018-09-28 ENCOUNTER — Encounter: Payer: Self-pay | Admitting: Family Medicine

## 2018-09-28 ENCOUNTER — Telehealth: Payer: Self-pay

## 2018-09-28 ENCOUNTER — Ambulatory Visit (INDEPENDENT_AMBULATORY_CARE_PROVIDER_SITE_OTHER): Payer: Medicare Other | Admitting: Family Medicine

## 2018-09-28 VITALS — Wt 133.0 lb

## 2018-09-28 DIAGNOSIS — J9611 Chronic respiratory failure with hypoxia: Secondary | ICD-10-CM | POA: Diagnosis not present

## 2018-09-28 DIAGNOSIS — J441 Chronic obstructive pulmonary disease with (acute) exacerbation: Secondary | ICD-10-CM | POA: Diagnosis not present

## 2018-09-28 DIAGNOSIS — F341 Dysthymic disorder: Secondary | ICD-10-CM | POA: Diagnosis not present

## 2018-09-28 DIAGNOSIS — J301 Allergic rhinitis due to pollen: Secondary | ICD-10-CM | POA: Diagnosis not present

## 2018-09-28 DIAGNOSIS — Z87891 Personal history of nicotine dependence: Secondary | ICD-10-CM | POA: Diagnosis not present

## 2018-09-28 DIAGNOSIS — Z9981 Dependence on supplemental oxygen: Secondary | ICD-10-CM | POA: Diagnosis not present

## 2018-09-28 NOTE — Telephone Encounter (Signed)
Amber advised [employer]

## 2018-09-28 NOTE — Progress Notes (Signed)
   Subjective:    Patient ID: Lenon Oms, female    DOB: 1946/02/10, 73 y.o.   MRN: [id number]  HPI Documentation for virtual telephone encounter.  Interactive audio and video telecommunications were attempted between this provider and patient, however she did not have access to video capability.  We continued and completed visit with audio only.  The patient was located at home. The provider was located in the office. The patient did consent to this visit and is aware of possible charges through their insurance for this visit.  The other persons participating in this telemedicine service were none. Time spent on call was 10 minutes and in review of previous records >30 minutes total.  This virtual service is not related to other E/M service within previous 7 days. She was admitted March 3 for evaluation of treatment of acute exacerbation of underlying COPD as well as pneumonia.  She was discharged on March 26 and then go to rehab.  She has been home for approximately 1 week.  She is slowly resuming her normal activities.  Her sister as well as daughters are there to help with her care.  Her medications were reviewed.  She is taking BuSpar, citalopram and occasionally using Xanax.  She continues on Singulair as well as Advair.  She was given Combivent in rehab which does seem to be helping.  She also has Incruse Ellipta and is also using that.  She continues on her oxygen.  A walker has been ordered for her.   Review of Systems     Objective:   Physical Exam Alert and in no distress.  No tachypnea is noted.       Assessment & Plan:  Chronic respiratory failure with hypoxia (HCC)  Non-seasonal allergic rhinitis due to pollen  Dysthymia  COPD with acute exacerbation ([place]) I reviewed all the medications with her.  She will stop the Incruse and switch to taking Combivent as well as continue on Advair.  She will also be using Singulair as well as Flonase.  Continue on her  citalopram and BuSpar and use Xanax on an as-needed basis.  She also continue on her oxygen therapy. She does seem to be getting enough help at home and at this point does not need any extra. Recommend she follow-up with pulmonary to see if they want to readjust her medications.  She was comfortable with that.

## 2018-09-28 NOTE — Telephone Encounter (Signed)
Alicen would like a verbal to ok Speech visit one more time. Please advise if ok. [place]

## 2018-09-28 NOTE — Telephone Encounter (Signed)
ok 

## 2018-09-28 NOTE — Telephone Encounter (Signed)
LVM to advise of OK. [place]

## 2018-09-29 DIAGNOSIS — J441 Chronic obstructive pulmonary disease with (acute) exacerbation: Secondary | ICD-10-CM | POA: Diagnosis not present

## 2018-09-29 DIAGNOSIS — J9611 Chronic respiratory failure with hypoxia: Secondary | ICD-10-CM | POA: Diagnosis not present

## 2018-09-29 DIAGNOSIS — Z87891 Personal history of nicotine dependence: Secondary | ICD-10-CM | POA: Diagnosis not present

## 2018-09-29 DIAGNOSIS — Z9981 Dependence on supplemental oxygen: Secondary | ICD-10-CM | POA: Diagnosis not present

## 2018-09-30 DIAGNOSIS — Z87891 Personal history of nicotine dependence: Secondary | ICD-10-CM | POA: Diagnosis not present

## 2018-09-30 DIAGNOSIS — J441 Chronic obstructive pulmonary disease with (acute) exacerbation: Secondary | ICD-10-CM | POA: Diagnosis not present

## 2018-09-30 DIAGNOSIS — Z9981 Dependence on supplemental oxygen: Secondary | ICD-10-CM | POA: Diagnosis not present

## 2018-09-30 DIAGNOSIS — J9611 Chronic respiratory failure with hypoxia: Secondary | ICD-10-CM | POA: Diagnosis not present

## 2018-10-01 ENCOUNTER — Other Ambulatory Visit: Payer: Self-pay | Admitting: *Deleted

## 2018-10-01 ENCOUNTER — Ambulatory Visit: Payer: Medicare Other | Admitting: *Deleted

## 2018-10-01 NOTE — Patient Outreach (Signed)
[facility]) Care Management  10/01/2018  Courtney Ayers 07/29/1945 [id number]   Transition of care (Unsuccessful)  RN attempted outreach call today however pt not available. Rn able to leave a HIPAA approved voice message requesting a call back.  Will follow up over the next week with another outreach call and continue transition of care services.  [name], RN Care Management Coordinator [place] Office [phone]

## 2018-10-01 NOTE — Patient Outreach (Signed)
[place] [facility]) Care Management  10/01/2018  Courtney Ayers 04/07/1946 [id number]    Transition of Care (Successful)  RN received a call back from pt wit an update on her ongoing management of care. Pt reports she has had a virtual call from her provider as her office visit and it with well. States no major issues as she continues to recovery from her recent discharged via SNF. Denies any issues at this time indicating she has enough supplies on all her medication. RN discussed possible mail orders on her medications. Pt is aware to request this is needed from her local pharmacy. Denies any issues with her breathing as pt's COPD managed well with her ongoing medications. RN  discussed the current plan of care in place along with all goals and adjusted interventions accordingly based upon pt's progress. Will continue to follow up with ongoing transition of care call over the next few weeks and continue to offer community and [facility] resources as needed as pt continues to manage her care. No request or inquires at this time as pt expressed her gratitude for the ongoing follow up calls and services.  THN CM Care Plan Problem One     Most Recent Value  Care Plan Problem One  Recent SNF discharge   Role Documenting the Problem One  Care Management [place] for Problem One  Active  THN Long Term Goal   Pt will not re-enter the SNF within the next 90 days.  THN Long Term Goal Start Date  09/24/18  Interventions for Problem One Long Term Goal  Will verify ongoing [facility]Health involvement and encouraged pt to continue to work w/ PT/OT on strengthening and endurance exercises to prevent acute events and/or incidents. Will continue to offer supportive resources and encouraged pt to adhere to this goal discussed today.   THN CM Short Term Goal #1   Adherence to all medical appointments over the next 30 days.  THN CM Short Term Goal #1 Start Date  09/24/18  Interventions for Short  Term Goal #1  Will continue to monitor pt's adherence with virtual call with her providers and/or office visits. Will continue to encouraged pt to follow up accordingly with the scheduled appointments. Will continue to re-evaluate pt's compliance with this goal over the next week.   THN CM Short Term Goal #2   Adherence to all discharge medications within the next 30 days.  THN CM Short Term Goal #2 Start Date  09/24/18  [facility] CM Short Term Goal #2 Met Date  10/01/18      [name], RN Care Management Coordinator [place] Office [phone]

## 2018-10-02 DIAGNOSIS — J441 Chronic obstructive pulmonary disease with (acute) exacerbation: Secondary | ICD-10-CM | POA: Diagnosis not present

## 2018-10-02 DIAGNOSIS — Z9981 Dependence on supplemental oxygen: Secondary | ICD-10-CM | POA: Diagnosis not present

## 2018-10-02 DIAGNOSIS — Z87891 Personal history of nicotine dependence: Secondary | ICD-10-CM | POA: Diagnosis not present

## 2018-10-02 DIAGNOSIS — J9611 Chronic respiratory failure with hypoxia: Secondary | ICD-10-CM | POA: Diagnosis not present

## 2018-10-05 DIAGNOSIS — Z9981 Dependence on supplemental oxygen: Secondary | ICD-10-CM | POA: Diagnosis not present

## 2018-10-05 DIAGNOSIS — J9611 Chronic respiratory failure with hypoxia: Secondary | ICD-10-CM | POA: Diagnosis not present

## 2018-10-05 DIAGNOSIS — J441 Chronic obstructive pulmonary disease with (acute) exacerbation: Secondary | ICD-10-CM | POA: Diagnosis not present

## 2018-10-05 DIAGNOSIS — Z87891 Personal history of nicotine dependence: Secondary | ICD-10-CM | POA: Diagnosis not present

## 2018-10-06 DIAGNOSIS — J441 Chronic obstructive pulmonary disease with (acute) exacerbation: Secondary | ICD-10-CM | POA: Diagnosis not present

## 2018-10-06 DIAGNOSIS — Z87891 Personal history of nicotine dependence: Secondary | ICD-10-CM | POA: Diagnosis not present

## 2018-10-06 DIAGNOSIS — Z9981 Dependence on supplemental oxygen: Secondary | ICD-10-CM | POA: Diagnosis not present

## 2018-10-06 DIAGNOSIS — J9611 Chronic respiratory failure with hypoxia: Secondary | ICD-10-CM | POA: Diagnosis not present

## 2018-10-07 ENCOUNTER — Other Ambulatory Visit: Payer: Self-pay | Admitting: *Deleted

## 2018-10-07 NOTE — Patient Outreach (Signed)
[place] [facility]) Care Management  10/07/2018  Courtney Ayers December 23, 1945 [id number]    Unsuccessful outreach for ongoing transition of care. RN able to leave a HIPAA approved voice message requesting a call back. Will further engage at that time.  [name], RN Care Management Coordinator [place] Office [phone]

## 2018-10-09 DIAGNOSIS — Z9981 Dependence on supplemental oxygen: Secondary | ICD-10-CM | POA: Diagnosis not present

## 2018-10-09 DIAGNOSIS — J441 Chronic obstructive pulmonary disease with (acute) exacerbation: Secondary | ICD-10-CM | POA: Diagnosis not present

## 2018-10-09 DIAGNOSIS — Z87891 Personal history of nicotine dependence: Secondary | ICD-10-CM | POA: Diagnosis not present

## 2018-10-09 DIAGNOSIS — J9611 Chronic respiratory failure with hypoxia: Secondary | ICD-10-CM | POA: Diagnosis not present

## 2018-10-12 ENCOUNTER — Other Ambulatory Visit: Payer: Self-pay | Admitting: *Deleted

## 2018-10-12 DIAGNOSIS — Z9981 Dependence on supplemental oxygen: Secondary | ICD-10-CM | POA: Diagnosis not present

## 2018-10-12 DIAGNOSIS — J9611 Chronic respiratory failure with hypoxia: Secondary | ICD-10-CM | POA: Diagnosis not present

## 2018-10-12 DIAGNOSIS — J441 Chronic obstructive pulmonary disease with (acute) exacerbation: Secondary | ICD-10-CM | POA: Diagnosis not present

## 2018-10-12 DIAGNOSIS — Z87891 Personal history of nicotine dependence: Secondary | ICD-10-CM | POA: Diagnosis not present

## 2018-10-12 NOTE — Patient Outreach (Signed)
[place] [facility]) Care Management  10/12/2018  Courtney Ayers 1946-03-02 [id number]   Telephone Assessment  RN has attempted several unsuccessful outreach calls. RN able to leave a HIPAA approved voice massage requesting a call back.  Outreach letter sent if no response over the allowed time will completed a case closure and notify the provider accordingly.  [name], RN Care Management Coordinator [place] Office [phone]

## 2018-10-14 ENCOUNTER — Other Ambulatory Visit: Payer: Self-pay | Admitting: *Deleted

## 2018-10-14 DIAGNOSIS — J441 Chronic obstructive pulmonary disease with (acute) exacerbation: Secondary | ICD-10-CM | POA: Diagnosis not present

## 2018-10-14 DIAGNOSIS — Z9981 Dependence on supplemental oxygen: Secondary | ICD-10-CM | POA: Diagnosis not present

## 2018-10-14 DIAGNOSIS — J9611 Chronic respiratory failure with hypoxia: Secondary | ICD-10-CM | POA: Diagnosis not present

## 2018-10-14 DIAGNOSIS — Z87891 Personal history of nicotine dependence: Secondary | ICD-10-CM | POA: Diagnosis not present

## 2018-10-14 NOTE — Patient Outreach (Addendum)
[facility] [facility]) Care Management  10/14/2018  MARVELYN BOUCHILLON [birth year] [id number]    RN received a request from the pt via voice message to call today after 3:30pm. RN followed up as requested however remains unsuccessful with this outreach. RN able to leave a HIPAA approved voice message requesting a call back. Outreach letter was sent with several unsuccessful call attempts.   Plan to close case if no response over the next week.  [name], RN Care Management Coordinator [place] Office [phone]

## 2018-10-15 DIAGNOSIS — J9611 Chronic respiratory failure with hypoxia: Secondary | ICD-10-CM | POA: Diagnosis not present

## 2018-10-15 DIAGNOSIS — Z9981 Dependence on supplemental oxygen: Secondary | ICD-10-CM | POA: Diagnosis not present

## 2018-10-15 DIAGNOSIS — J441 Chronic obstructive pulmonary disease with (acute) exacerbation: Secondary | ICD-10-CM | POA: Diagnosis not present

## 2018-10-15 DIAGNOSIS — Z87891 Personal history of nicotine dependence: Secondary | ICD-10-CM | POA: Diagnosis not present

## 2018-10-16 DIAGNOSIS — Z87891 Personal history of nicotine dependence: Secondary | ICD-10-CM | POA: Diagnosis not present

## 2018-10-16 DIAGNOSIS — J441 Chronic obstructive pulmonary disease with (acute) exacerbation: Secondary | ICD-10-CM | POA: Diagnosis not present

## 2018-10-16 DIAGNOSIS — J9611 Chronic respiratory failure with hypoxia: Secondary | ICD-10-CM | POA: Diagnosis not present

## 2018-10-16 DIAGNOSIS — Z9981 Dependence on supplemental oxygen: Secondary | ICD-10-CM | POA: Diagnosis not present

## 2018-10-20 DIAGNOSIS — Z9981 Dependence on supplemental oxygen: Secondary | ICD-10-CM | POA: Diagnosis not present

## 2018-10-20 DIAGNOSIS — J9611 Chronic respiratory failure with hypoxia: Secondary | ICD-10-CM | POA: Diagnosis not present

## 2018-10-20 DIAGNOSIS — Z87891 Personal history of nicotine dependence: Secondary | ICD-10-CM | POA: Diagnosis not present

## 2018-10-20 DIAGNOSIS — J441 Chronic obstructive pulmonary disease with (acute) exacerbation: Secondary | ICD-10-CM | POA: Diagnosis not present

## 2018-10-21 ENCOUNTER — Other Ambulatory Visit: Payer: Self-pay | Admitting: *Deleted

## 2018-10-21 DIAGNOSIS — J9611 Chronic respiratory failure with hypoxia: Secondary | ICD-10-CM | POA: Diagnosis not present

## 2018-10-21 DIAGNOSIS — J441 Chronic obstructive pulmonary disease with (acute) exacerbation: Secondary | ICD-10-CM | POA: Diagnosis not present

## 2018-10-21 DIAGNOSIS — Z87891 Personal history of nicotine dependence: Secondary | ICD-10-CM | POA: Diagnosis not present

## 2018-10-21 DIAGNOSIS — Z9981 Dependence on supplemental oxygen: Secondary | ICD-10-CM | POA: Diagnosis not present

## 2018-10-21 NOTE — Patient Outreach (Signed)
[place] [facility]) Care Management  10/21/2018  Courtney Ayers 1946-05-26 [id number]    Case Closure  Several unsuccessful calls and no response to the outreach letter over the last few weeks. Will contact the pt's provider concerning pt's disposition with [facility] services at this time.  Case will be closed.  [name], RN Care Management Coordinator [place] Office [phone]

## 2018-10-22 DIAGNOSIS — Z9981 Dependence on supplemental oxygen: Secondary | ICD-10-CM | POA: Diagnosis not present

## 2018-10-22 DIAGNOSIS — Z87891 Personal history of nicotine dependence: Secondary | ICD-10-CM | POA: Diagnosis not present

## 2018-10-22 DIAGNOSIS — J441 Chronic obstructive pulmonary disease with (acute) exacerbation: Secondary | ICD-10-CM | POA: Diagnosis not present

## 2018-10-22 DIAGNOSIS — J9611 Chronic respiratory failure with hypoxia: Secondary | ICD-10-CM | POA: Diagnosis not present

## 2018-10-28 DIAGNOSIS — J441 Chronic obstructive pulmonary disease with (acute) exacerbation: Secondary | ICD-10-CM | POA: Diagnosis not present

## 2018-10-28 DIAGNOSIS — Z87891 Personal history of nicotine dependence: Secondary | ICD-10-CM | POA: Diagnosis not present

## 2018-10-28 DIAGNOSIS — Z9981 Dependence on supplemental oxygen: Secondary | ICD-10-CM | POA: Diagnosis not present

## 2018-10-28 DIAGNOSIS — J9611 Chronic respiratory failure with hypoxia: Secondary | ICD-10-CM | POA: Diagnosis not present

## 2018-11-04 DIAGNOSIS — J9611 Chronic respiratory failure with hypoxia: Secondary | ICD-10-CM | POA: Diagnosis not present

## 2018-11-04 DIAGNOSIS — Z87891 Personal history of nicotine dependence: Secondary | ICD-10-CM | POA: Diagnosis not present

## 2018-11-04 DIAGNOSIS — J441 Chronic obstructive pulmonary disease with (acute) exacerbation: Secondary | ICD-10-CM | POA: Diagnosis not present

## 2018-11-04 DIAGNOSIS — Z9981 Dependence on supplemental oxygen: Secondary | ICD-10-CM | POA: Diagnosis not present

## 2018-11-11 ENCOUNTER — Other Ambulatory Visit: Payer: Self-pay | Admitting: Pulmonary Disease

## 2018-11-11 DIAGNOSIS — J9611 Chronic respiratory failure with hypoxia: Secondary | ICD-10-CM | POA: Diagnosis not present

## 2018-11-11 DIAGNOSIS — Z87891 Personal history of nicotine dependence: Secondary | ICD-10-CM | POA: Diagnosis not present

## 2018-11-11 DIAGNOSIS — J441 Chronic obstructive pulmonary disease with (acute) exacerbation: Secondary | ICD-10-CM | POA: Diagnosis not present

## 2018-11-11 DIAGNOSIS — Z9981 Dependence on supplemental oxygen: Secondary | ICD-10-CM | POA: Diagnosis not present

## 2018-11-17 DIAGNOSIS — Z87891 Personal history of nicotine dependence: Secondary | ICD-10-CM | POA: Diagnosis not present

## 2018-11-17 DIAGNOSIS — J441 Chronic obstructive pulmonary disease with (acute) exacerbation: Secondary | ICD-10-CM | POA: Diagnosis not present

## 2018-11-17 DIAGNOSIS — J9611 Chronic respiratory failure with hypoxia: Secondary | ICD-10-CM | POA: Diagnosis not present

## 2018-11-17 DIAGNOSIS — Z9981 Dependence on supplemental oxygen: Secondary | ICD-10-CM | POA: Diagnosis not present

## 2018-11-18 ENCOUNTER — Telehealth: Payer: Self-pay | Admitting: Family Medicine

## 2018-11-18 NOTE — Telephone Encounter (Signed)
[facility]ilynn from [facility] is calling requesting verbal orders for home health pt for 1 time a week for 4 weeks she can be reached at [phone]

## 2018-11-18 NOTE — Telephone Encounter (Signed)
Courtney Ayers was notified and given verbal orders

## 2018-11-18 NOTE — Telephone Encounter (Signed)
ok 

## 2018-11-21 DIAGNOSIS — J441 Chronic obstructive pulmonary disease with (acute) exacerbation: Secondary | ICD-10-CM | POA: Diagnosis not present

## 2018-11-21 DIAGNOSIS — Z9981 Dependence on supplemental oxygen: Secondary | ICD-10-CM | POA: Diagnosis not present

## 2018-11-21 DIAGNOSIS — J9611 Chronic respiratory failure with hypoxia: Secondary | ICD-10-CM | POA: Diagnosis not present

## 2018-11-21 DIAGNOSIS — Z87891 Personal history of nicotine dependence: Secondary | ICD-10-CM | POA: Diagnosis not present

## 2018-11-27 DIAGNOSIS — Z87891 Personal history of nicotine dependence: Secondary | ICD-10-CM | POA: Diagnosis not present

## 2018-11-27 DIAGNOSIS — J441 Chronic obstructive pulmonary disease with (acute) exacerbation: Secondary | ICD-10-CM | POA: Diagnosis not present

## 2018-11-27 DIAGNOSIS — J9611 Chronic respiratory failure with hypoxia: Secondary | ICD-10-CM | POA: Diagnosis not present

## 2018-11-27 DIAGNOSIS — Z9981 Dependence on supplemental oxygen: Secondary | ICD-10-CM | POA: Diagnosis not present

## 2018-12-02 DIAGNOSIS — Z9981 Dependence on supplemental oxygen: Secondary | ICD-10-CM | POA: Diagnosis not present

## 2018-12-02 DIAGNOSIS — J9611 Chronic respiratory failure with hypoxia: Secondary | ICD-10-CM | POA: Diagnosis not present

## 2018-12-02 DIAGNOSIS — J441 Chronic obstructive pulmonary disease with (acute) exacerbation: Secondary | ICD-10-CM | POA: Diagnosis not present

## 2018-12-02 DIAGNOSIS — Z87891 Personal history of nicotine dependence: Secondary | ICD-10-CM | POA: Diagnosis not present

## 2018-12-03 ENCOUNTER — Telehealth: Payer: Self-pay | Admitting: Pulmonary Disease

## 2018-12-03 NOTE — Telephone Encounter (Signed)
Error

## 2018-12-03 NOTE — Telephone Encounter (Signed)
Received a RX request from [facility] for a refill on patient's Ventolin inhaler. Reviewed patient's chart, she has never requested a refill to be sent to [place].   Left message for patient to call back to make sure that this refill request is legit. Will keep the faxed form in my folder for follow up.

## 2018-12-09 DIAGNOSIS — J441 Chronic obstructive pulmonary disease with (acute) exacerbation: Secondary | ICD-10-CM | POA: Diagnosis not present

## 2018-12-09 DIAGNOSIS — J9611 Chronic respiratory failure with hypoxia: Secondary | ICD-10-CM | POA: Diagnosis not present

## 2018-12-09 DIAGNOSIS — Z87891 Personal history of nicotine dependence: Secondary | ICD-10-CM | POA: Diagnosis not present

## 2018-12-09 DIAGNOSIS — Z9981 Dependence on supplemental oxygen: Secondary | ICD-10-CM | POA: Diagnosis not present

## 2018-12-16 ENCOUNTER — Telehealth: Payer: Self-pay

## 2018-12-16 DIAGNOSIS — Z87891 Personal history of nicotine dependence: Secondary | ICD-10-CM | POA: Diagnosis not present

## 2018-12-16 DIAGNOSIS — Z9981 Dependence on supplemental oxygen: Secondary | ICD-10-CM | POA: Diagnosis not present

## 2018-12-16 DIAGNOSIS — J441 Chronic obstructive pulmonary disease with (acute) exacerbation: Secondary | ICD-10-CM | POA: Diagnosis not present

## 2018-12-16 DIAGNOSIS — J9611 Chronic respiratory failure with hypoxia: Secondary | ICD-10-CM | POA: Diagnosis not present

## 2018-12-16 MED ORDER — AMOXICILLIN-POT CLAVULANATE 875-125 MG PO TABS: 1.0000 | ORAL_TABLET | Freq: Two times a day (BID) | ORAL | 0 refills | Status: DC

## 2018-12-16 NOTE — Telephone Encounter (Signed)
Pt called and stated she has a sinus infection that started yesterday but is worse today. She stated she has COPD and she don't want it to get worse. If a medication is sent she wants it to got to [facility].   [phone] patient would like a call back.

## 2018-12-16 NOTE — Telephone Encounter (Signed)
Pt was notified.  

## 2018-12-16 NOTE — Telephone Encounter (Signed)
Let her know that I called an antibiotic in and make sure that they know that it is a home delivery

## 2018-12-22 ENCOUNTER — Other Ambulatory Visit: Payer: Self-pay | Admitting: Pulmonary Disease

## 2018-12-29 ENCOUNTER — Telehealth: Payer: Self-pay | Admitting: Pulmonary Disease

## 2018-12-29 NOTE — Telephone Encounter (Signed)
Called & spoke w/ pt. Pt states she has plenty of her montelukast medication, however, she received a notification from her pharmacy that she needed to contact her provider for further refills. According to last montelukast order placed 11/11/2018, BQ requested "no further refills needs ov". I let pt know of this and offered an appointment since her last visit was almost one year ago. Pt verbalized understanding and agreed to setting up a telephonic visit with Courtney Ayers 12/30/2018 at 10:00 AM.   Appointment has been scheduled. Nothing further needed at this time.

## 2018-12-30 ENCOUNTER — Encounter: Payer: Self-pay | Admitting: Pulmonary Disease

## 2018-12-30 ENCOUNTER — Other Ambulatory Visit: Payer: Self-pay

## 2018-12-30 ENCOUNTER — Ambulatory Visit (INDEPENDENT_AMBULATORY_CARE_PROVIDER_SITE_OTHER): Payer: Medicare Other | Admitting: Pulmonary Disease

## 2018-12-30 DIAGNOSIS — J209 Acute bronchitis, unspecified: Secondary | ICD-10-CM

## 2018-12-30 DIAGNOSIS — J9611 Chronic respiratory failure with hypoxia: Secondary | ICD-10-CM

## 2018-12-30 DIAGNOSIS — J449 Chronic obstructive pulmonary disease, unspecified: Secondary | ICD-10-CM

## 2018-12-30 DIAGNOSIS — R918 Other nonspecific abnormal finding of lung field: Secondary | ICD-10-CM | POA: Diagnosis not present

## 2018-12-30 DIAGNOSIS — J301 Allergic rhinitis due to pollen: Secondary | ICD-10-CM

## 2018-12-30 MED ORDER — INCRUSE ELLIPTA 62.5 MCG/INH IN AEPB: 1.0000 | INHALATION_SPRAY | Freq: Every day | RESPIRATORY_TRACT | 5 refills | Status: DC

## 2018-12-30 MED ORDER — MONTELUKAST SODIUM 10 MG PO TABS: ORAL_TABLET | ORAL | 1 refills | Status: DC

## 2018-12-30 NOTE — Assessment & Plan Note (Signed)
Plan: Continue Advair 250 Continue Incruse Ellipta Continue to increase daily physical activity Continue oxygen therapy as prescribed Resume hypertonic saline nebs 1-2 times daily Continue flutter valve, breather, incentive spirometer use Follow-up with our office in 3 months with a chest x-ray

## 2018-12-30 NOTE — Assessment & Plan Note (Signed)
Plan: We discussed multiple follow-up options for her history of pulmonary nodules especially based off of August/2019 CT chest.  Patient declined future CT serial imaging at this time.  Patient does not want to proceed forward with any sort of invasive diagnostic testing, palliative radiation, or chemotherapy if that was offered.  Patient explains that with her quality of life, comorbidities as well as her thought process right now she is okay with a conservative management of pulmonary nodules.  Patient has agreed to outpatient chest x-ray at next office visit to follow pulmonary nodules.

## 2018-12-30 NOTE — Assessment & Plan Note (Signed)
Plan: Continue daily antihistamine Continue hypertonic saline nebs Continue Singulair Continue nasal sprays

## 2018-12-30 NOTE — Assessment & Plan Note (Signed)
Plan: Continue oxygen therapy as prescribed Notify our office if you have worsened oxygen levels are maintaining oxygen levels below 88%

## 2018-12-30 NOTE — Progress Notes (Signed)
Virtual Visit via Telephone Note  I connected with Courtney Ayers on 12/30/18 at 10:00 AM EDT by telephone and verified that I am speaking with the correct person using two identifiers.  Location: Patient: Home Provider: Office [facility] - [address], [place], [place], [place] [address]   I discussed the limitations, risks, security and privacy concerns of performing an evaluation and management service by telephone and the availability of in person appointments. I also discussed with the patient that there may be a patient responsible charge related to this service. The patient expressed understanding and agreed to proceed.  Patient consented to consult via telephone: Yes People present and their role in pt care: Pt    History of Present Illness: 73 year old female former smoker followed in our office for COPD and chronic respiratory failure Past medical history: Anxiety, hypertension, constipation, osteopenia Smoking history: Former smoker.  Quit 2016.  45-pack-year smoking history. Maintenance: Advair 250, Incruse Ellipta, Hypertonic Saline 1-2x daily  Patient of Dr. Lake Bells  Chief complaint: COPD Follow up    73 year old female former smoker followed in our office for COPD as well as chronic respiratory failure.  Patient is completing a telephonic visit today due to needing refills of her medications.  Patient is requesting refills of Incruse Ellipta as well as montelukast.  Patient is maintained on Advair 250, Incruse Ellipta, hypertonic saline nebs 1-2 times daily, Singulair.  Patient was last evaluated by our office in August/2019.  Patient was last hospitalized in March/2020.  Patient feels that she is finally getting back to baseline.  She has been slowly increasing her physical activity.  She is able to ambulate around her home.  She has not had worsening shortness of breath, wheezing or cough.  Patient continues to use her flutter valve 3 times daily as well as her  breather 3 times daily.  She has not been using her hypertonic saline every day but she reports she uses it when she feels that she is struggling to produce mucus.  Patient continues to be maintained on oxygen.  Patient notes she recently had completed her August/2019 CT chest which showed pulmonary nodules.  Patient does not want to complete a repeat CT at this time to follow those nodules.  Patient does not feel that she would proceed forward with any sort of invasive testing, biopsy, chemotherapy or radiation if in fact nodules were malignant.  Patient is agreeable to conservative management as well as following with chest x-rays.  Observations/Objective:  Imaging: February 2016 CT angiogram of the chest images> there is moderate to severe emphysema bilaterally throughout both lungs, there is a 1 cm, smooth bordered, solid nodule in the right lower lobe 08/2014 PET CT 35mm RLL nodule is not hypermetabolic, no mediastinal lymph nodes 03/2015 CT chest > no change in nodule previously seen but there are multiple new nodules that the radiologist feels may be inflammatory, rec repeat in 6-8 weeks November 2016 CT chest: No change in previously seen nodules, in fact most are smaller, but there is a new 1.4 cm sub-solid nodule seen, recommend follow-up 3 months 07/2015 CT chest > signifnicant emphysema many of the nodules previously seen have decreased in size but there are now new ones which are approximately 1.47 m in size 12/2015 CT chest > improved prior nodules, new 1.5x1.7cm LUL posterior nodule 05/2016 CT chest > emphysema and left lower lobe consolidation January 2018 CT chest images  showing severe centrilobular emphysema most prominent in the upper  lobes there is a 2.7 x 7.2 mm lesion in the left upper lobe other scattered nodules noted including a patchy area of consolidation versus nodule which is pleural-based in the left lower lobe 2.7 x 1.5 cm July 2018 CT chest images : new spiculated 2.2 cm  RUL nodule, other nodules resolved  02/03/2018-CT chest without contrast- index lesion within left upper lobe identified on exam from 02/18/2017 has decreased in size from previous exam suggestive of inflammatory or infectious etiology, several new nodular densities are identified in both lungs which are indeterminate, largest is right upper lobe and has maximum dimension of 3.3 cm, advanced changes of emphysema >>> Recommending follow-up noncontrast chest CT in 3 to 6 months, if nodules are stable at that time then future CT at 18 to 24 months  10/07/2014-pulmonary function test- spirometry-consistent with very severe airflow obstruction, DLCO severely reduced, likely due to emphysema  08/08/2014-echocardiogram-LV ejection fraction 60 to 67%, grade 1 diastolic dysfunction, PA peak pressure 38  Assessment and Plan:  Chronic respiratory failure with hypoxia ([place]) Plan: Continue oxygen therapy as prescribed Notify our office if you have worsened oxygen levels are maintaining oxygen levels below 88%  COPD (chronic obstructive pulmonary disease) ([place]) Plan: Continue Advair 250 Continue Incruse Ellipta Continue to increase daily physical activity Continue oxygen therapy as prescribed Resume hypertonic saline nebs 1-2 times daily Continue flutter valve, breather, incentive spirometer use Follow-up with our office in 3 months with a chest x-ray  Multiple pulmonary nodules Plan: We discussed multiple follow-up options for her history of pulmonary nodules especially based off of August/2019 CT chest.  Patient declined future CT serial imaging at this time.  Patient does not want to proceed forward with any sort of invasive diagnostic testing, palliative radiation, or chemotherapy if that was offered.  Patient explains that with her quality of life, comorbidities as well as her thought process right now she is okay with a conservative management of pulmonary nodules.  Patient has agreed to outpatient  chest x-ray at next office visit to follow pulmonary nodules.  Allergic rhinitis Plan: Continue daily antihistamine Continue hypertonic saline nebs Continue Singulair Continue nasal sprays   Follow Up Instructions:  Return in about 3 months (around 04/01/2019), or if symptoms worsen or fail to improve, for Follow up with Dr. Lake Bells.   I discussed the assessment and treatment plan with the patient. The patient was provided an opportunity to ask questions and all were answered. The patient agreed with the plan and demonstrated an understanding of the instructions.   The patient was advised to call back or seek an in-person evaluation if the symptoms worsen or if the condition fails to improve as anticipated.  I provided 32 minutes of non-face-to-face time during this encounter.   Lauraine Rinne, NP

## 2018-12-30 NOTE — Patient Instructions (Addendum)
History of Pulmonary nodules: Repeat Chest Xray at next follow up visit in 3 months   COPD: Continue Advair and Incruse Continue albuterol as needed Use hypertonic saline 1-2 times a day to help clear mucus out Continue Mucinex  Continue Flutter Valve and Breather use   Refills sent to pharmacy   Note your daily symptoms > remember "red flags" for COPD:   >>>Increase in cough >>>increase in sputum production >>>increase in shortness of breath or activity  intolerance.   If you notice these symptoms, please call the office to be seen.    Chronic respiratory failure with hypoxemia: Continue oxygen therapy as prescribed  >>>maintain oxygen saturations greater than 88 percent  >>>if unable to maintain oxygen saturations please contact the office  >>>do not smoke with oxygen  >>>can use nasal saline gel or nasal saline rinses to moisturize nose if oxygen causes dryness   Return in about 3 months (around 04/01/2019), or if symptoms worsen or fail to improve, for Follow up with Dr. Lake Bells.   Coronavirus (COVID-19) Are you at risk?  Are you at risk for the Coronavirus (COVID-19)?  To be considered HIGH RISK for Coronavirus (COVID-19), you have to meet the following criteria:  . Traveled to [place], [place], [place], [place] or [place]; or in the [place] to [place], [place], [place], or [place]; and have fever, cough, and shortness of breath within the last 2 weeks of travel OR . Been in close contact with a person diagnosed with COVID-19 within the last 2 weeks and have fever, cough, and shortness of breath . IF YOU DO NOT MEET THESE CRITERIA, YOU ARE CONSIDERED LOW RISK FOR COVID-19.  What to do if you are HIGH RISK for COVID-19?  [name] If you are having a medical emergency, call 911. . Seek medical care right away. Before you go to a doctor's office, urgent care or emergency department, call ahead and tell them about your recent travel, contact with someone diagnosed  with COVID-19, and your symptoms. You should receive instructions from your physician's office regarding next steps of care.  . When you arrive at healthcare provider, tell the healthcare staff immediately you have returned from visiting [place], [place], [place], [place] or [place]; or traveled in the [place] to [place], [place], [place], or [place]; in the last two weeks or you have been in close contact with a person diagnosed with COVID-19 in the last 2 weeks.   . Tell the health care staff about your symptoms: fever, cough and shortness of breath. . After you have been seen by a medical provider, you will be either: o Tested for (COVID-19) and discharged home on quarantine except to seek medical care if symptoms worsen, and asked to  - Stay home and avoid contact with others until you get your results (4-5 days)  - Avoid travel on public transportation if possible (such as bus, train, or airplane) or o Sent to the Emergency Department by EMS for evaluation, COVID-19 testing, and possible admission depending on your condition and test results.  What to do if you are LOW RISK for COVID-19?  Reduce your risk of any infection by using the same precautions used for avoiding the common cold or flu:  [name] Wash your hands often with soap and warm water for at least 20 seconds.  If soap and water are not readily available, use an alcohol-based hand sanitizer with at least 60% alcohol.  . If coughing or sneezing,  cover your mouth and nose by coughing or sneezing into the elbow areas of your shirt or coat, into a tissue or into your sleeve (not your hands). . Avoid shaking hands with others and consider head nods or verbal greetings only. . Avoid touching your eyes, nose, or mouth with unwashed hands.  . Avoid close contact with people who are sick. . Avoid places or events with large numbers of people in one location, like concerts or sporting events. . Carefully consider travel plans you have  or are making. . If you are planning any travel outside or inside the [place], visit the CDC's Travelers' Health webpage for the latest health notices. . If you have some symptoms but not all symptoms, continue to monitor at home and seek medical attention if your symptoms worsen. . If you are having a medical emergency, call 911.   [place] / e-Visit: [email or web address]         [facility]: [facility]: [phone]                   [facility]: [phone]           It is flu season:   >>> Best ways to protect herself from the flu: Receive the yearly flu vaccine, practice good hand hygiene washing with soap and also using hand sanitizer when available, eat a nutritious meals, get adequate rest, hydrate appropriately   Please contact the office if your symptoms worsen or you have concerns that you are not improving.   Thank you for choosing [facility] for your healthcare, and for allowing [place] to partner with you on your healthcare journey. I am thankful to be able to provide care to you today.   Wyn Quaker FNP-C      COPD and Physical Activity Chronic obstructive pulmonary disease (COPD) is a long-term (chronic) condition that affects the lungs. COPD is a general term that can be used to describe many different lung problems that cause lung swelling (inflammation) and limit airflow, including chronic bronchitis and emphysema. The main symptom of COPD is shortness of breath, which makes it harder to do even simple tasks. This can also make it harder to exercise and be active. Talk with your health care provider about treatments to help you breathe better and actions you can take to prevent breathing problems during physical activity. What are the benefits of exercising with COPD? Exercising regularly is an important  part of a healthy lifestyle. You can still exercise and do physical activities even though you have COPD. Exercise and physical activity improve your shortness of breath by increasing blood flow (circulation). This causes your heart to pump more oxygen through your body. Moderate exercise can improve your:  Oxygen use.  Energy level.  Shortness of breath.  Strength in your breathing muscles.  Heart health.  Sleep.  Self-esteem and feelings of self-worth.  Depression, stress, and anxiety levels. Exercise can benefit everyone with COPD. The severity of your disease may affect how hard you can exercise, especially at first, but everyone can benefit. Talk with your health care provider about how much exercise is safe for you, and which activities and exercises are safe for you. What actions can I take to prevent breathing problems during physical activity?  Sign up for a pulmonary rehabilitation program. This type of program may include: ? Education about lung diseases. ? Exercise classes that  teach you how to exercise and be more active while improving your breathing. This usually involves:  Exercise using your lower extremities, such as a stationary bicycle.  About 30 minutes of exercise, 2 to 5 times per week, for 6 to 12 weeks  Strength training, such as push ups or leg lifts. ? Nutrition education. ? Group classes in which you can talk with others who also have COPD and learn ways to manage stress.  If you use an oxygen tank, you should use it while you exercise. Work with your health care provider to adjust your oxygen for your physical activity. Your resting flow rate is different from your flow rate during physical activity.  While you are exercising: ? Take slow breaths. ? Pace yourself and do not try to go too fast. ? Purse your lips while breathing out. Pursing your lips is similar to a kissing or whistling position. ? If doing exercise that uses a quick burst of effort,  such as weight lifting:  Breathe in before starting the exercise.  Breathe out during the hardest part of the exercise (such as raising the weights). Where to find support You can find support for exercising with COPD from:  Your health care provider.  A pulmonary rehabilitation program.  Your local health department or community health programs.  Support groups, online or in-person. Your health care provider may be able to recommend support groups. Where to find more information You can find more information about exercising with COPD from:  American Lung Association: ClassInsider.se.  COPD Foundation: https://www.rivera.net/. Contact a health care provider if:  Your symptoms get worse.  You have chest pain.  You have nausea.  You have a fever.  You have trouble talking or catching your breath.  You want to start a new exercise program or a new activity. Summary  COPD is a general term that can be used to describe many different lung problems that cause lung swelling (inflammation) and limit airflow. This includes chronic bronchitis and emphysema.  Exercise and physical activity improve your shortness of breath by increasing blood flow (circulation). This causes your heart to provide more oxygen to your body.  Contact your health care provider before starting any exercise program or new activity. Ask your health care provider what exercises and activities are safe for you. This information is not intended to replace advice given to you by your health care provider. Make sure you discuss any questions you have with your health care provider. Document Released: 06/19/2017 Document Revised: 09/16/2018 Document Reviewed: 06/19/2017 Elsevier Patient Education  [place].     Exercises To Do While Sitting  Exercises that you do while sitting (chair exercises) can give you many of the same benefits as full exercise. Benefits include strengthening your heart, burning calories,  and keeping muscles and joints healthy. Exercise can also improve your mood and help with depression and anxiety. You may benefit from chair exercises if you are unable to do standing exercises because of:  Diabetic foot pain.  Obesity.  Illness.  Arthritis.  Recovery from surgery or injury.  Breathing problems.  Balance problems.  Another type of disability. Before starting chair exercises, check with your health care provider or a physical therapist to find out how much exercise you can tolerate and which exercises are safe for you. If your health care provider approves:  Start out slowly and build up over time. Aim to work up to about 10-20 minutes for each exercise session.  Make  exercise part of your daily routine.  Drink water when you exercise. Do not wait until you are thirsty. Drink every 10-15 minutes.  Stop exercising right away if you have pain, nausea, shortness of breath, or dizziness.  If you are exercising in a wheelchair, make sure to lock the wheels.  Ask your health care provider whether you can do tai chi or yoga. Many positions in these mind-body exercises can be modified to do while seated. Warm-up Before starting other exercises: 1. Sit up as straight as you can. Have your knees bent at 90 degrees, which is the shape of the capital letter "L." Keep your feet flat on the floor. 2. Sit at the front edge of your chair, if you can. 3. Pull in (tighten) the muscles in your abdomen and stretch your spine and neck as straight as you can. Hold this position for a few minutes. 4. Breathe in and out evenly. Try to concentrate on your breathing, and relax your mind. Stretching Exercise A: Arm stretch 1. Hold your arms out straight in front of your body. 2. Bend your hands at the wrist with your fingers pointing up, as if signaling someone to stop. Notice the slight tension in your forearms as you hold the position. 3. Keeping your arms out and your hands bent,  rotate your hands outward as far as you can and hold this stretch. Aim to have your thumbs pointing up and your pinkie fingers pointing down. Slowly repeat arm stretches for one minute as tolerated. Exercise B: Leg stretch 1. If you can move your legs, try to "draw" letters on the floor with the toes of your foot. Write your name with one foot. 2. Write your name with the toes of your other foot. Slowly repeat the movements for one minute as tolerated. Exercise C: Reach for the sky 1. Reach your hands as far over your head as you can to stretch your spine. 2. Move your hands and arms as if you are climbing a rope. Slowly repeat the movements for one minute as tolerated. Range of motion exercises Exercise A: Shoulder roll 1. Let your arms hang loosely at your sides. 2. Lift just your shoulders up toward your ears, then let them relax back down. 3. When your shoulders feel loose, rotate your shoulders in backward and forward circles. Do shoulder rolls slowly for one minute as tolerated. Exercise B: March in place 1. As if you are marching, pump your arms and lift your legs up and down. Lift your knees as high as you can. ? If you are unable to lift your knees, just pump your arms and move your ankles and feet up and down. March in place for one minute as tolerated. Exercise C: Seated jumping jacks 1. Let your arms hang down straight. 2. Keeping your arms straight, lift them up over your head. Aim to point your fingers to the ceiling. 3. While you lift your arms, straighten your legs and slide your heels along the floor to your sides, as wide as you can. 4. As you bring your arms back down to your sides, slide your legs back together. ? If you are unable to use your legs, just move your arms. Slowly repeat seated jumping jacks for one minute as tolerated. Strengthening exercises Exercise A: Shoulder squeeze 1. Hold your arms straight out from your body to your sides, with your elbows bent  and your fists pointed at the ceiling. 2. Keeping your arms in the bent  position, move them forward so your elbows and forearms meet in front of your face. 3. Open your arms back out as wide as you can with your elbows still bent, until you feel your shoulder blades squeezing together. Hold for 5 seconds. Slowly repeat the movements forward and backward for one minute as tolerated. Contact a health care provider if you:  Had to stop exercising due to any of the following: ? Pain. ? Nausea. ? Shortness of breath. ? Dizziness. ? Fatigue.  Have significant pain or soreness after exercising. Get help right away if you have:  Chest pain.  Difficulty breathing. These symptoms may represent a serious problem that is an emergency. Do not wait to see if the symptoms will go away. Get medical help right away. Call your local emergency services (911 in the [place] Do not drive yourself to the hospital. This information is not intended to replace advice given to you by your health care provider. Make sure you discuss any questions you have with your health care provider. Document Released: 04/09/2017 Document Revised: 09/17/2018 Document Reviewed: 04/09/2017 Elsevier Patient Education  [place] Oxygen Use, Adult When a medical condition keeps you from getting enough oxygen, your health care provider may instruct you to take extra oxygen at home. Your health care provider will let you know:  When to take oxygen.  For how long to take oxygen.  How quickly oxygen should be delivered (flow rate), in liters per minute (LPM or L/M). Home oxygen can be given through:  A mask.  A nasal cannula. This is a device or tube that goes in the nostrils.  A transtracheal catheter. This is a small, flexible tube placed in the trachea.  A tracheostomy. This is a surgically made opening in the trachea. These devices are connected with tubing to an oxygen source, such as:  A tank.  Tanks hold oxygen in gas form. They must be replaced when the oxygen is used up.  A liquid oxygen device. This holds oxygen in liquid form. It must be replaced when the oxygen is used up.  An oxygen concentrator machine. This filters oxygen in the room. It uses electricity, so you must have a backup cylinder of oxygen in case the power goes out. Supplies needed: To use oxygen, you will need:  A mask, nasal cannula, transtracheal catheter, or tracheostomy.  An oxygen tank, a liquid oxygen device, or an oxygen concentrator.  The tape that your health care provider recommends (optional). If you use a transtracheal catheter and your prescribed flow rate is 1 LPM or greater, you will also need a humidifier. Risks and complications  Fire. This can happen if the oxygen is exposed to a heat source, flame, or spark.  Injury to skin. This can happen if liquid oxygen touches your skin.  Organ damage. This can happen if you get too little oxygen. How to use oxygen Your health care provider or a representative from your [place] will show you how to use your oxygen device. Follow her or his instructions. The instructions may look something like this: 1. Wash your hands. 2. If you use an oxygen concentrator, make sure it is plugged in. 3. Place one end of the tube into the port on the tank, device, or machine. 4. Place the mask over your nose and mouth. Or, place the nasal cannula and secure it with tape if instructed. If you use a tracheostomy or transtracheal catheter, connect it  to the oxygen source as directed. 5. Make sure the liter-flow setting on the machine is at the level prescribed by your health care provider. 6. Turn on the machine or adjust the knob on the tank or device to the correct liter-flow setting. 7. When you are done, turn off and unplug the machine, or turn the knob to OFF. How to clean and care for the oxygen supplies Nasal cannula  Clean it with a warm, wet  cloth daily or as needed.  Wash it with a liquid soap once a week.  Rinse it thoroughly once or twice a week.  Replace it every 2-4 weeks.  If you have an infection, such as a cold or pneumonia, change the cannula when you get better. Mask  Replace it every 2-4 weeks.  If you have an infection, such as a cold or pneumonia, change the mask when you get better. Humidifier bottle  Wash the bottle between each refill: ? Wash it with soap and warm water. ? Rinse it thoroughly. ? Disinfect it and its top. ? Air-dry it.  Make sure it is dry before you refill it. Oxygen concentrator  Clean the air filter at least twice a week according to directions from your home medical equipment and service company.  Wipe down the cabinet every day. To do this: ? Unplug the unit. ? Wipe down the cabinet with a damp cloth. ? Dry the cabinet. Other equipment  Change any extra tubing every 1-3 months.  Follow instructions from your health care provider about taking care of any other equipment. Safety tips Fire safety tips   Keep your oxygen and oxygen supplies at least 5 ft away from sources of heat, flames, and sparks at all times.  Do not allow smoking near your oxygen. Put up "no smoking" signs in your home. Avoid smoking areas when in public.  Do not use materials that can burn (are flammable) while you use oxygen.  When you go to a restaurant with portable oxygen, ask to be seated in the nonsmoking section.  Keep a Data processing manager close by. Let your fire department know that you have oxygen in your home.  Test your home smoke detectors regularly. Traveling  Secure your oxygen tank in the vehicle so that it does not move around. Follow instructions from your medical device company about how to safely secure your tank.  Make sure you have enough oxygen for the amount of time you will be away from home.  If you are planning air travel, contact the airline to find out if they allow  the use of an approved portable oxygen concentrator. You may also need documents from your health care provider and medical device company before you travel. General safety tips  If you use an oxygen cylinder, make sure it is in a stand or secured to an object that will not move (fixed object).  If you use liquid oxygen, make sure its container is kept upright.  If you use an oxygen concentrator: ? Dance movement psychotherapist company. Make sure you are given priority service in the event that your power goes out. ? Avoid using extension cords, if possible. Follow these instructions at home:  Use oxygen only as told by your health care provider.  Do not use alcohol or other drugs that make you relax (sedating drugs) unless instructed. They can slow down your breathing rate and make it hard to get in enough oxygen.  Know how and when to order a refill  of oxygen.  Always keep a spare tank of oxygen. Plan ahead for holidays when you may not be able to get a prescription filled.  Use water-based lubricants on your lips or nostrils. Do not use oil-based products like petroleum jelly.  To prevent skin irritation on your cheeks or behind your ears, tuck some gauze under the tubing. Contact a health care provider if:  You get headaches often.  You have shortness of breath.  You have a lasting cough.  You have anxiety.  You are sleepy all the time.  You develop an illness that affects your breathing.  You cannot exercise at your regular level.  You are restless.  You have difficult or irregular breathing, and it is getting worse.  You have a fever.  You have persistent redness under your nose. Get help right away if:  You are confused.  You have blue lips or fingernails.  You are struggling to breathe. Summary  Your health care provider or a representative from your [place] will show you how to use your oxygen device. Follow her or his instructions.  If you use an  oxygen concentrator, make sure it is plugged in.  Make sure the liter-flow setting on the machine is at the level prescribed by your health care provider.  Keep your oxygen and oxygen supplies at least 5 ft away from sources of heat, flames, and sparks at all times. This information is not intended to replace advice given to you by your health care provider. Make sure you discuss any questions you have with your health care provider. Document Released: 08/17/2003 Document Revised: 11/13/2017 Document Reviewed: 12/19/2015 Elsevier Patient Education  2020 [employer].

## 2018-12-31 NOTE — Progress Notes (Signed)
Reviewed, agree 

## 2019-01-04 ENCOUNTER — Other Ambulatory Visit: Payer: Self-pay | Admitting: Pulmonary Disease

## 2019-02-05 ENCOUNTER — Other Ambulatory Visit: Payer: Self-pay | Admitting: Pulmonary Disease

## 2019-02-05 ENCOUNTER — Telehealth: Payer: Self-pay | Admitting: Pulmonary Disease

## 2019-02-05 MED ORDER — ALBUTEROL SULFATE (2.5 MG/3ML) 0.083% IN NEBU: INHALATION_SOLUTION | RESPIRATORY_TRACT | 5 refills | Status: DC

## 2019-02-05 NOTE — Telephone Encounter (Signed)
Spoke with the pharmacist  She states needing rx for albuterol neb sol sent with dx code  Rx sent  Nothing further needed

## 2019-02-25 ENCOUNTER — Other Ambulatory Visit: Payer: Self-pay

## 2019-02-25 ENCOUNTER — Ambulatory Visit (INDEPENDENT_AMBULATORY_CARE_PROVIDER_SITE_OTHER): Payer: Medicare Other | Admitting: Family Medicine

## 2019-02-25 ENCOUNTER — Encounter: Payer: Self-pay | Admitting: Family Medicine

## 2019-02-25 VITALS — BP 102/68 | HR 87 | Temp 96.9°F | Ht 66.75 in | Wt 146.8 lb

## 2019-02-25 DIAGNOSIS — J449 Chronic obstructive pulmonary disease, unspecified: Secondary | ICD-10-CM | POA: Diagnosis not present

## 2019-02-25 DIAGNOSIS — Z66 Do not resuscitate: Secondary | ICD-10-CM

## 2019-02-25 DIAGNOSIS — F419 Anxiety disorder, unspecified: Secondary | ICD-10-CM

## 2019-02-25 DIAGNOSIS — I10 Essential (primary) hypertension: Secondary | ICD-10-CM

## 2019-02-25 DIAGNOSIS — Z23 Encounter for immunization: Secondary | ICD-10-CM

## 2019-02-25 DIAGNOSIS — J9611 Chronic respiratory failure with hypoxia: Secondary | ICD-10-CM | POA: Diagnosis not present

## 2019-02-25 DIAGNOSIS — J441 Chronic obstructive pulmonary disease with (acute) exacerbation: Secondary | ICD-10-CM

## 2019-02-25 DIAGNOSIS — R5381 Other malaise: Secondary | ICD-10-CM | POA: Diagnosis not present

## 2019-02-25 DIAGNOSIS — F341 Dysthymic disorder: Secondary | ICD-10-CM

## 2019-02-25 DIAGNOSIS — I700 Atherosclerosis of aorta: Secondary | ICD-10-CM | POA: Diagnosis not present

## 2019-02-25 DIAGNOSIS — Z86010 Personal history of colonic polyps: Secondary | ICD-10-CM

## 2019-02-25 DIAGNOSIS — J301 Allergic rhinitis due to pollen: Secondary | ICD-10-CM

## 2019-02-25 DIAGNOSIS — Z85820 Personal history of malignant melanoma of skin: Secondary | ICD-10-CM

## 2019-02-25 MED ORDER — ATORVASTATIN CALCIUM 10 MG PO TABS: 10.0000 mg | ORAL_TABLET | Freq: Every day | ORAL | 3 refills | Status: DC

## 2019-02-25 NOTE — Progress Notes (Signed)
Courtney Ayers is a 73 y.o. female who presents for annual wellness visit and follow-up on chronic medical conditions.  She continues to be followed by pulmonary for her underlying COPD, allergies and seems to be doing well.  She continues on chronic oxygen therapy.  Her pulse ox is in the 95 range.  She presently is not taking Protonix and is not on any medications for her high blood pressure.  She has a previous history of melanoma and does see dermatology regularly.  She rarely uses Xanax but this is the first time she has been out of her house since her hospitalization in March.  She does have a history of colonic polyp because of her lung disease, follow-up colonoscopy has been postponed.  She does have a history of atherosclerosis and also needs a follow-up mammogram.  She will set up for mammogram.  Immunizations and Health Maintenance Immunization History  Administered Date(s) Administered  . Fluad Quad(high Dose 65+) 02/25/2019  . Influenza Split 07/03/2011, 02/14/2012  . Influenza Whole 05/16/2004  . Influenza, High Dose Seasonal PF 03/18/2013, 03/24/2014, 03/08/2015  . Influenza,inj,Quad PF,6+ Mos 03/21/2016, 05/07/2017  . Influenza-Unspecified 03/27/2018  . PPD Test 05/30/1999  . Pneumococcal Conjugate-13 03/24/2014  . Pneumococcal Polysaccharide-23 08/06/2000, 01/30/2011  . Td 09/23/2001  . Tdap 01/30/2011  . Zoster 03/05/2011  . Zoster Recombinat (Shingrix) 12/20/2016, 03/27/2018   Health Maintenance Due  Topic Date Due  . COLONOSCOPY  08/28/2016  . MAMMOGRAM  08/29/2018  . INFLUENZA VACCINE  01/09/2019    Last Pap smear: 08-08-15 Last mammogram: 08-28-16 Last colonoscopy: 08-29-11 Last DEXA: 07-05-15 Dentist: one year  Ophtho: one year ago Exercise: rehab ot ad pt house work walking  Other doctors caring for patient include: Dr. Olin Ayers, Dr. Carlean Ayers GI  Advanced directives: Pt has on file    Depression screen:  See questionnaire below.  Depression screen  [facility] 2/9 02/25/2019 09/24/2018 02/23/2018 05/15/2016 11/23/2015  Decreased Interest 0 0 0 0 0  Down, Depressed, Hopeless 0 0 0 0 0  PHQ - 2 Score 0 0 0 0 0    Fall Risk Screen: see questionnaire below. Fall Risk  02/25/2019 09/24/2018 02/23/2018 05/15/2016 11/23/2015  Falls in the past year? 1 1 No No No  Number falls in past yr: 0 0 - - -  Injury with Fall? - 0 - - -  Comment - Bruise - - -  Risk for fall due to : Impaired balance/gait Impaired balance/gait - - -  Follow up - Education provided;Falls prevention discussed - - -    ADL screen:  See questionnaire below Functional Status Survey: Is the patient deaf or have difficulty hearing?: No Does the patient have difficulty seeing, even when wearing glasses/contacts?: No Does the patient have difficulty concentrating, remembering, or making decisions?: No(at times) Does the patient have difficulty walking or climbing stairs?: No Does the patient have difficulty dressing or bathing?: No Does the patient have difficulty doing errands alone such as visiting a doctor's office or shopping?: Yes   Review of Systems Constitutional: -, -unexpected weight change, -anorexia, -fatigue Allergy: -sneezing, -itching, -congestion Dermatology: denies changing moles, rash, lumps ENT: -runny nose, -ear pain, -sore throat,  Cardiology:  -chest pain, -palpitations, -orthopnea, Respiratory: -cough,  Gastroenterology: -abdominal pain, -nausea, -vomiting, -diarrhea, -constipation, -dysphagia Hematology: -bleeding or bruising problems Musculoskeletal: -arthralgias, -myalgias, -joint swelling, -back pain, - Ophthalmology: -vision changes,  Urology: -dysuria, -difficulty urinating,  -urinary frequency, -urgency, incontinence Neurology: -, -numbness, , -memory loss, -falls, -dizziness  PHYSICAL EXAM:  BP 102/68 (BP Location: Left Arm, Patient Position: Sitting)   Pulse 87   Temp (!) 96.9 F (36.1 C)   Ht 5' 6.75" (1.695 m)   Wt 146 lb 12.8 oz (66.6  kg)   LMP 06/10/1992   SpO2 99%   BMI 23.16 kg/m   General Appearance: Alert, cooperative, no distress, appears stated age Head: Normocephalic, without obvious abnormality, atraumatic Eyes: PERRL, conjunctiva/corneas clear, EOM's intact, fundi benign Ears: Normal TM's and external ear canals Nose: Nares normal, mucosa normal, no drainage or sinus tenderness Throat: Lips, mucosa, and tongue normal; teeth and gums normal Neck: Supple, no lymphadenopathy;  thyroid:  no enlargement/tenderness/nodules; no carotid bruit or JVD Lungs: Show distant sounds.   Heart: Regular rate and rhythm, S1 and S2 normal, no murmur, rubor gallop   Extremities: No clubbing, cyanosis is noted in lower extremities  Skin:  Skin color, texture, turgor normal, no rashes or lesions Lymph nodes: Cervical, supraclavicular, and axillary nodes normal Neurologic:  CNII-XII intact, normal strength, sensation and gait; reflexes 2+ and symmetric throughout Psych: Normal mood, affect, hygiene and grooming.  ASSESSMENT/PLAN: Chronic respiratory failure with hypoxia (HCC)  Non-seasonal allergic rhinitis due to pollen  Dysthymia  COPD with acute exacerbation (HCC) - Plan: CBC with Differential/Platelet, Comprehensive metabolic panel  Debility  Need for influenza vaccination - Plan: Flu Vaccine QUAD High Dose(Fluad)  DNR (do not resuscitate)  Atherosclerosis of aorta ([place]) - Plan: Lipid panel, atorvastatin (LIPITOR) 10 MG tablet  Chronic obstructive pulmonary disease, unspecified COPD type ([place])  History of colonic polyps  Anxiety  Essential hypertension  History of melanoma She is stable on her present medications as well as O2.  She will follow-up with a mammogram and also with dermatology.  At this point colonoscopy is not an option because of her underlying lung disease.  Discussed the need to start on a statin to help reduce her risk of cardiovascular disease.  She was comfortable with that.      Immunization recommendations discussed.  Colonoscopy recommendations reviewed and follow-up colonoscopy is not recommended at this time.   Medicare Attestation I have personally reviewed: The patient's medical and social history Their use of alcohol, tobacco or illicit drugs Their current medications and supplements The patient's functional ability including ADLs,fall risks, home safety risks, cognitive, and hearing and visual impairment Diet and physical activities Evidence for depression or mood disorders  The patient's weight, height, and BMI have been recorded in the chart.  I have made referrals, counseling, and provided education to the patient based on review of the above and I have provided the patient with a written personalized care plan for preventive services.     Jill Alexanders, MD   02/25/2019

## 2019-02-26 LAB — CBC WITH DIFFERENTIAL/PLATELET
Basophils Absolute: 0 10*3/uL (ref 0.0–0.2)
Basos: 0 %
EOS (ABSOLUTE): 0 10*3/uL (ref 0.0–0.4)
Eos: 0 %
Hematocrit: 39.5 % (ref 34.0–46.6)
Hemoglobin: 13.1 g/dL (ref 11.1–15.9)
Immature Grans (Abs): 0 10*3/uL (ref 0.0–0.1)
Immature Granulocytes: 0 %
Lymphocytes Absolute: 1.3 10*3/uL (ref 0.7–3.1)
Lymphs: 20 %
MCH: 30.5 pg (ref 26.6–33.0)
MCHC: 33.2 g/dL (ref 31.5–35.7)
MCV: 92 fL (ref 79–97)
Monocytes Absolute: 0.5 10*3/uL (ref 0.1–0.9)
Monocytes: 8 %
Neutrophils Absolute: 4.6 10*3/uL (ref 1.4–7.0)
Neutrophils: 72 %
Platelets: 206 10*3/uL (ref 150–450)
RBC: 4.3 x10E6/uL (ref 3.77–5.28)
RDW: 13.2 % (ref 11.7–15.4)
WBC: 6.5 10*3/uL (ref 3.4–10.8)

## 2019-02-26 LAB — COMPREHENSIVE METABOLIC PANEL
ALT: 19 IU/L (ref 0–32)
AST: 22 IU/L (ref 0–40)
Albumin/Globulin Ratio: 2.4 — ABNORMAL HIGH (ref 1.2–2.2)
Albumin: 4.3 g/dL (ref 3.7–4.7)
Alkaline Phosphatase: 87 IU/L (ref 39–117)
BUN/Creatinine Ratio: 20 (ref 12–28)
BUN: 15 mg/dL (ref 8–27)
Bilirubin Total: 0.3 mg/dL (ref 0.0–1.2)
CO2: 28 mmol/L (ref 20–29)
Calcium: 9.6 mg/dL (ref 8.7–10.3)
Chloride: 106 mmol/L (ref 96–106)
Creatinine, Ser: 0.75 mg/dL (ref 0.57–1.00)
GFR calc Af Amer: 91 mL/min/{1.73_m2} (ref >59)
GFR calc non Af Amer: 79 mL/min/{1.73_m2} (ref >59)
Globulin, Total: 1.8 g/dL (ref 1.5–4.5)
Glucose: 99 mg/dL (ref 65–99)
Potassium: 5.1 mmol/L (ref 3.5–5.2)
Sodium: 144 mmol/L (ref 134–144)
Total Protein: 6.1 g/dL (ref 6.0–8.5)

## 2019-02-26 LAB — LIPID PANEL
Chol/HDL Ratio: 1.8 ratio (ref 0.0–4.4)
Cholesterol, Total: 160 mg/dL (ref 100–199)
HDL: 90 mg/dL (ref >39)
LDL Chol Calc (NIH): 49 mg/dL (ref 0–99)
Triglycerides: 125 mg/dL (ref 0–149)
VLDL Cholesterol Cal: 21 mg/dL (ref 5–40)

## 2019-03-01 ENCOUNTER — Telehealth: Payer: Self-pay | Admitting: Pulmonary Disease

## 2019-03-01 NOTE — Telephone Encounter (Signed)
LMTCB

## 2019-03-01 NOTE — Telephone Encounter (Signed)
03/01/2019 1606  That is fine if the patient would like to purchase a POC out-of-pocket.  She needs to confirm that the POC that she is going to purchase out-of-pocket can maintain 2 L continuous.  I records indicate that is what she was last needing when she was walked:  SIX MIN WALK 05/07/2017 05/07/2017 07/11/2016 07/20/2015 10/07/2014 08/26/2014  Supplimental Oxygen during Test? (L/min) Yes No No No No No  O2 Flow Rate 2 - - - - -  Type Continuous - - - - -  Tech Comments: - pt's O2 dropped so she was placed on 2lpm O2 Patient was able to complete 2 laps without oxygen. Patient did have complaints of SOB and hip pain during walk.  pt walked a moderate pace, tolerated walk well.  pt did do some pursed-lip breathing during 3rd lap of walk test.  Pt's lowest ambulatory stat was 91 on RA. Pt walked at a moderate pace with no breaks. pt walked a slow to moderate pace, tolerated walk well.     None further record these last records are from 2018.  I would highly recommend that we get the patient back into our office to establish with a different provider as Dr. Lake Bells will be working full-time at [employer] and will not be working in our clinic.  This will need to be a 30-minute office visit.  At that office visit we also could walk the patient to further evaluate if her oxygen needs have changed.  My concern is is that if she spends $2500 out of pocket and then is requiring 4 L continuous actually to maintain oxygen saturations in the POC likely would not maintain the patient's oxygen saturations to where she would need them.  I would recommend that we walk her in office prior to her making this purchase out-of-pocket.  Please get patient set up with a different pulmonary provider in our office here.  That will need to be a 30-minute office visit.  Wyn Quaker, FNP

## 2019-03-01 NOTE — Telephone Encounter (Signed)
Called the patient and confirmed she checks her O2 three times daily after her breathing exercises #'s yesterday was 95%, 97%, 96% and this morning was 96%. Patient stated that she does not use a walker or rollator for support. She is either pulling or pushing the O2 tank at home.  Advised the patient she will need to be walked in clinic  to make sure the device she wants will work for her. She confirmed the POC provided 0.5 - 2L continuous and 1 - 6 L pulse.  Patient agreed to OV. Set up appt with Dr. Carlis Abbott on 03/10/19 at 9:15. Patient aware she will need to be walked on a POC to determine what setting works for her.   Advised patient I will call back [employer] to let them know they are not to ship or charge the patient until she has been seen by our office and the form has been signed.  Called [employer] and advised them the form will not be returned to them until next week when the patient is seen and walked in clinic to confirm O2. Advised they patient specifically stated she was not to be charged and nothing shipped until we send the form to them to process.

## 2019-03-01 NOTE — Telephone Encounter (Signed)
Pt would like to speak w/ someone about POC.  Pt can be reached at [phone].
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
## 2019-03-01 NOTE — Telephone Encounter (Signed)
Josh w/ [employer] O2.  Would like to verify pt has been ordered O2.  Can be reached at [phone], ask for [facility] Fax# [phone]

## 2019-03-01 NOTE — Telephone Encounter (Signed)
Form placed up front to be held until patient seen by Dr. Carlis Abbott on 03/10/19.

## 2019-03-10 ENCOUNTER — Ambulatory Visit: Payer: Medicare Other | Admitting: Critical Care Medicine

## 2019-03-17 ENCOUNTER — Telehealth: Payer: Self-pay | Admitting: Critical Care Medicine

## 2019-03-17 NOTE — Telephone Encounter (Signed)
Call returned to patient, made aware of PC recommendations. Voiced understanding. Aware she will need to have someone else provide transportation. Nothing further needed at this time.

## 2019-03-17 NOTE — Telephone Encounter (Signed)
Call returned to patient, she states she has not been out of her home in 2 months. Her daughter was exposed to someone at work and had to be tested but she is not having symptoms. Her sister just wanted to be tested just because. She is wanting to have the in office visit because she wants her someone to listen to her lungs and she wants to be evaluated for a POC. I made her aware I would get the message to [facility] and get back with her.   PC please advise. Patient has an in office visit tomorrow, requesting to bring her sister who has pending covid test. The patient nor the sister is having symptoms, the patient has not been around the sister, only contact for 3 weeks will be for transportation tomorrow. I made her aware anyone with a pending covid test is not allowed in building however she requests that I ask anyway. Thanks.

## 2019-03-18 ENCOUNTER — Encounter: Payer: Self-pay | Admitting: Critical Care Medicine

## 2019-03-18 ENCOUNTER — Other Ambulatory Visit: Payer: Self-pay

## 2019-03-18 ENCOUNTER — Ambulatory Visit (INDEPENDENT_AMBULATORY_CARE_PROVIDER_SITE_OTHER): Payer: Medicare Other | Admitting: Critical Care Medicine

## 2019-03-18 ENCOUNTER — Ambulatory Visit (INDEPENDENT_AMBULATORY_CARE_PROVIDER_SITE_OTHER): Payer: Medicare Other

## 2019-03-18 ENCOUNTER — Telehealth: Payer: Self-pay | Admitting: Critical Care Medicine

## 2019-03-18 VITALS — BP 128/72 | HR 78 | Temp 97.1°F | Ht 66.75 in | Wt 148.2 lb

## 2019-03-18 DIAGNOSIS — J440 Chronic obstructive pulmonary disease with acute lower respiratory infection: Secondary | ICD-10-CM | POA: Diagnosis not present

## 2019-03-18 DIAGNOSIS — J9611 Chronic respiratory failure with hypoxia: Secondary | ICD-10-CM

## 2019-03-18 DIAGNOSIS — J449 Chronic obstructive pulmonary disease, unspecified: Secondary | ICD-10-CM

## 2019-03-18 DIAGNOSIS — J209 Acute bronchitis, unspecified: Secondary | ICD-10-CM

## 2019-03-18 DIAGNOSIS — J301 Allergic rhinitis due to pollen: Secondary | ICD-10-CM | POA: Diagnosis not present

## 2019-03-18 MED ORDER — ALBUTEROL SULFATE HFA 108 (90 BASE) MCG/ACT IN AERS: 2.0000 | INHALATION_SPRAY | Freq: Four times a day (QID) | RESPIRATORY_TRACT | 11 refills | Status: DC | PRN

## 2019-03-18 MED ORDER — FLUTTER DEVI: 1.0000 | Freq: Two times a day (BID) | 0 refills | Status: DC

## 2019-03-18 MED ORDER — FLUTICASONE-SALMETEROL 250-50 MCG/DOSE IN AEPB: INHALATION_SPRAY | RESPIRATORY_TRACT | 2 refills | Status: DC

## 2019-03-18 MED ORDER — SODIUM CHLORIDE 3 % IN NEBU: INHALATION_SOLUTION | Freq: Two times a day (BID) | RESPIRATORY_TRACT | 11 refills | Status: DC | PRN

## 2019-03-18 MED ORDER — INCRUSE ELLIPTA 62.5 MCG/INH IN AEPB: 1.0000 | INHALATION_SPRAY | Freq: Every day | RESPIRATORY_TRACT | 3 refills | Status: DC

## 2019-03-18 NOTE — Telephone Encounter (Signed)
Spoke with Levada Dy at [employer] and she states they do not carry the [place] device. FYI Dr. Carlis Abbott. Please advise.   Referral Notes Number of Notes: 3 . Type Date User Summary Attachment  General 03/18/2019 3:52 PM Ilona Sorrel - -  Note   Message sent to Melissa/Jenny/Angela at [employer].        . Type Date User Summary Attachment  General 03/18/2019 2:36 PM Ilona Sorrel - -  Note   Holding for signature.        . Type Date User Summary Attachment  Provider Comments 03/18/2019 2:21 PM Valerie Salts, CMA Provider Comments -  Note   Dr. Carlis Abbott would like for the patient to receive an [employer] device. [employer] has confirmed that they can order this device

## 2019-03-18 NOTE — Progress Notes (Signed)
Synopsis: Referred in March 2018 for COPD by [name], MD. Previously a patient of Dr. Lake Bells.  Subjective:   PATIENT ID: Courtney Ayers GENDER: female DOB: [birth year], MRN: [id number]  Chief Complaint  Patient presents with  . Cough    When she cough's the congestion is very thick and hard to get up, even when taking mucinex and drinking water. Does have a flutter valve. Using her medications as directed.    Courtney Ayers is a 73 y/o woman being seen to follow up for COPD and chronic hypoxic respiratory failure.  She has previously a patient of Dr. Anastasia Pall since 2016.  Her most recent hospitalization was in March 2020.  She quit smoking in 2016 after 45 pack years.  She is on triple inhaled therapy with Advair and Incruse. She uses her rescue inhaler 1-2 times daily most days. She continues to have significant thick sputum, and she has been taking mucinex regularly but not using hypertonic saline nebs. She infrequently uses her flutter valve. Instead she has been using a "breather" device she was given at rehab to strengthen her respiratory muscles, which she uses diligently.  Since discharge from rehab about 3 weeks after her hospitalization this year, hse has been able to get back to her IADLs and ADLs, although she rests frequently due to dyspnea. At home when she checks her saturations on her home 2L, her saturations are in the upper 90s. Although it has been discussed, she has never done pulmonary rehab in the past.  She has been on 2 L of home oxygen since her hospitalization in February 2016.  In 2017 showed overnight oximetry demonstrating desaturation to 84%.  She wants to purchase a portable oxygen concentrator, and she needs to try walking on this today to verify that her oxygen concentration is stable.    She has chronic allergic rhinosinusitis for which she takes Singulair, but she has not recently been using Flonase or an antihistamine. She occasionally uses afrin nasal  spray.  She has received her seasonal flu shot this year.     Past Medical History:  Diagnosis Date  . Adenomatous colon polyp   . Allergic rhinitis, cause unspecified    seasonal  . COPD (chronic obstructive pulmonary disease) ([place])    no home O2  . Depressive disorder, not elsewhere classified 2006   some anxiety recurs when she has stopped med in past  . OA (osteoarthritis)    B THUMB, RIGHT > LEFT HIP, BACK  . Osteopenia      Family History  Problem Relation Age of Onset  . Cancer Mother        bladder, metastatic  . Alcohol abuse Father   . Heart disease Father        CHF  . Hyperlipidemia Father   . Hypertension Father   . Aortic aneurysm Sister   . Crohn's disease Sister   . Hyperlipidemia Sister   . Cancer Sister        brain cancer in her 56's  . Diabetes Maternal Uncle   . Cancer Maternal Grandmother 98       pancreatic  . Cancer Maternal Grandfather        bladder cancer in 27's  . Stroke Paternal Grandmother   . Heart disease Paternal Grandfather        MI later 54's, early 33's     Past Surgical History:  Procedure Laterality Date  . BREAST ENHANCEMENT SURGERY  1978   implants  placed '78, removed and replaced  approx '95 (Dr. Wendy Poet)  . COLONOSCOPY  08/2011  . COSMETIC SURGERY     LIPOSUCTION; other facial cosmetic surgery  . GANGLION CYST EXCISION    . [place] HERNIA REPAIR  2004   right, and ganglion cyst removal  . LIPOMA EXCISION    . MELANOMA EXCISION  2013   R upper arm  . NASAL SEPTUM SURGERY  1987   for deviated septum (Dr. Wendy Poet)  . TONSILLECTOMY AND ADENOIDECTOMY  1951  . VIDEO BRONCHOSCOPY Bilateral 08/08/2015   Procedure: VIDEO BRONCHOSCOPY WITHOUT FLUORO;  Surgeon: Juanito Doom, MD;  Location: [facility] END[facility]COPY;  Service: Cardiopulmonary;  Laterality: Bilateral;    Social History   Socioeconomic History  . Marital status: Single    Spouse name: Not on file  . Number of children: Not on file  . Years of education: Not  on file  . Highest education level: Not on file  Occupational History  . Not on file  Social Needs  . Financial resource strain: Not on file  . Food insecurity    Worry: Not on file    Inability: Not on file  . Transportation needs    Medical: Not on file    Non-medical: Not on file  Tobacco Use  . Smoking status: Former Smoker    Packs/day: 1.00    Years: 45.00    Pack years: 45.00    Types: Cigarettes    Quit date: 08/05/2014    Years since quitting: 4.6  . Smokeless tobacco: Never Used  Substance and Sexual Activity  . Alcohol use: Yes    Alcohol/week: 0.0 standard drinks    Comment: 2 drinks per night  . Drug use: No  . Sexual activity: Not Currently    Partners: Female  Lifestyle  . Physical activity    Days per week: Not on file    Minutes per session: Not on file  . Stress: Not on file  Relationships  . Social Herbalist on phone: Not on file    Gets together: Not on file    Attends religious service: Not on file    Active member of club or organization: Not on file    Attends meetings of clubs or organizations: Not on file    Relationship status: Not on file  . Intimate partner violence    Fear of current or ex partner: Not on file    Emotionally abused: Not on file    Physically abused: Not on file    Forced sexual activity: Not on file  Other Topics Concern  . Not on file  Social History Narrative   Lives with other members of the family, 5 cat in the home, 2 dog outside     No Known Allergies   Immunization History  Administered Date(s) Administered  . Fluad Quad(high Dose 65+) 02/25/2019  . Influenza Split 07/03/2011, 02/14/2012  . Influenza Whole 05/16/2004  . Influenza, High Dose Seasonal PF 03/18/2013, 03/24/2014, 03/08/2015  . Influenza,inj,Quad PF,6+ Mos 03/21/2016, 05/07/2017  . Influenza-Unspecified 03/27/2018  . PPD Test 05/30/1999  . Pneumococcal Conjugate-13 03/24/2014  . Pneumococcal Polysaccharide-23 08/06/2000,  01/30/2011  . Td 09/23/2001  . Tdap 01/30/2011  . Zoster 03/05/2011  . Zoster Recombinat (Shingrix) 12/20/2016, 03/27/2018    Outpatient Medications Prior to Visit  Medication Sig Dispense Refill  . albuterol (PROVENTIL) (2.5 MG/3ML) 0.083% nebulizer solution USE 1 VIAL VIA NEBULIZER EVERY 2 HOURS AS NEEDED FOR SHORTNESS OF BREATH  375 mL 5  . ALPRAZolam (XANAX) 0.5 MG tablet Take 0.5 mg by mouth at bedtime as needed for anxiety.    [name] atorvastatin (LIPITOR) 10 MG tablet Take 1 tablet (10 mg total) by mouth daily. 90 tablet 3  . Calcium Carbonate-Vitamin D (CALCIUM-VITAMIN D) 500-200 MG-UNIT tablet Take 1 tablet by mouth daily.    . citalopram (CELEXA) 10 MG tablet TAKE 1 TABLET EVERY DAY (Patient taking differently: Take 10 mg by mouth at bedtime. ) 90 tablet 3  . fluticasone (FLONASE) 50 MCG/ACT nasal spray Place 2 sprays into both nostrils daily. 16 g 5  . guaiFENesin (MUCINEX) 600 MG 12 hr tablet Take 1,200 mg by mouth 2 (two) times daily.     . montelukast (SINGULAIR) 10 MG tablet TAKE 1 TABLET BY MOUTH EVERYDAY AT BEDTIME 90 tablet 1  . Multiple Vitamin (MULTI VITAMIN DAILY PO) Take by mouth.    . OXYGEN Inhale 2 L into the lungs.    [name] oxymetazoline (AFRIN) 0.05 % nasal spray Place 1 spray into both nostrils 2 (two) times daily as needed for congestion.    [name] albuterol (PROVENTIL HFA;VENTOLIN HFA) 108 (90 Base) MCG/ACT inhaler Inhale 2 puffs into the lungs every 6 (six) hours as needed for wheezing or shortness of breath. 3 Inhaler 1  . Fluticasone-Salmeterol (ADVAIR DISKUS) 250-50 MCG/DOSE AEPB INHALE 1 PUFF INTO THE LUNGS EVERY 12 HOURS. 180 each 2  . sodium chloride HYPERTONIC 3 % nebulizer solution Take by nebulization 2 (two) times daily as needed for other. 360 mL 11  . umeclidinium bromide (INCRUSE ELLIPTA) 62.5 MCG/INH AEPB Inhale 1 puff into the lungs daily. 30 each 5  . busPIRone (BUSPAR) 7.5 MG tablet Take 7.5 mg by mouth 3 (three) times daily.    . pantoprazole (PROTONIX) 40  MG tablet Take 1 tablet (40 mg total) by mouth daily at 6 (six) AM for 30 days. (Patient not taking: Reported on 09/28/2018) 30 tablet 0   No facility-administered medications prior to visit.     Review of Systems  Constitutional: Negative for chills, fever and weight loss.  HENT: Negative.   Eyes: Negative for blurred vision and double vision.  Respiratory: Positive for cough, sputum production and shortness of breath. Negative for hemoptysis.   Cardiovascular: Negative for chest pain and leg swelling.  Gastrointestinal: Negative for diarrhea, nausea and vomiting.  Genitourinary: Negative.   Musculoskeletal: Negative for joint pain and myalgias.  Skin: Negative for rash.  Neurological: Negative.   Endo/Heme/Allergies: Negative.      Objective:   Vitals:   03/18/19 1333  BP: 128/72  Pulse: 78  Temp: (!) 97.1 F (36.2 C)  SpO2: 98%  Weight: 148 lb 3.2 oz (67.2 kg)  Height: 5' 6.75" (1.695 m)   98% on 2 LPM  BMI Readings from Last 3 Encounters:  03/18/19 23.39 kg/m  02/25/19 23.16 kg/m  09/28/18 20.83 kg/m   Wt Readings from Last 3 Encounters:  03/18/19 148 lb 3.2 oz (67.2 kg)  02/25/19 146 lb 12.8 oz (66.6 kg)  09/28/18 133 lb (60.3 kg)    Physical Exam Vitals signs reviewed.  Constitutional:      General: She is not in acute distress.    Appearance: Normal appearance. She is not ill-appearing.  HENT:     Head: Normocephalic and atraumatic.     Nose:     Comments: Deferred due to masking requirement.    Mouth/Throat:     Comments: Deferred due to masking requirement. Eyes:  General: No scleral icterus. Neck:     Musculoskeletal: Neck supple.  Cardiovascular:     Rate and Rhythm: Normal rate and regular rhythm.     Heart sounds: No murmur.  Pulmonary:     Comments: Breathing comfortably on 2L [place]. Prolonged expiration, but no wheezing. Abdominal:     General: There is no distension.     Palpations: Abdomen is soft.     Tenderness: There is no  abdominal tenderness.  Musculoskeletal:        General: No swelling or deformity.  Lymphadenopathy:     Cervical: No cervical adenopathy.  Skin:    General: Skin is warm and dry.     Findings: No rash.  Neurological:     General: No focal deficit present.     Mental Status: She is alert.     Coordination: Coordination normal.     Gait: Gait normal.  Psychiatric:        Mood and Affect: Mood normal.        Behavior: Behavior normal.      CBC    Component Value Date/Time   WBC 6.5 02/25/2019 1521   WBC 9.3 09/03/2018 0440   RBC 4.30 02/25/2019 1521   RBC 3.46 (L) 09/03/2018 0440   HGB 13.1 02/25/2019 1521   HCT 39.5 02/25/2019 1521   PLT 206 02/25/2019 1521   MCV 92 02/25/2019 1521   MCH 30.5 02/25/2019 1521   MCH 30.3 09/03/2018 0440   MCHC 33.2 02/25/2019 1521   MCHC 29.7 (L) 09/03/2018 0440   RDW 13.2 02/25/2019 1521   LYMPHSABS 1.3 02/25/2019 1521   MONOABS 0.6 09/02/2018 0508   EOSABS 0.0 02/25/2019 1521   BASOSABS 0.0 02/25/2019 1521     Chest Imaging- films reviewed: CXR 08/14/2018- Increased basilar lung markings. Hyperinflated with flattened hemidiaphragms.  Pulmonary Functions Testing Results: PFT Results Latest Ref Rng & Units 10/07/2014  FVC-Pre L 2.36  FVC-Predicted Pre % 67  FVC-Post L 2.51  FVC-Predicted Post % 72  Pre FEV1/FVC % % 33  Post FEV1/FCV % % 33  FEV1-Pre L 0.77  FEV1-Predicted Pre % 29  FEV1-Post L 0.82  DLCO UNC% % 25  DLCO COR %Predicted % 26  TLC L 7.07  TLC % Predicted % 125  RV % Predicted % 180  Very severe obstruction with severe diffusion impairment.  Pathology:  BAL left lower lobe-no malignancy  Micro: Fungus culture 11/28/2015-Growth of Penicillium species, Growth of An arthrospore-producing mold, not Coccidioides immitis  08/08/2015 BAL- Aspergillus ochraceus  08/08/2015 BAL AFB- negative 08/08/2015 BAL- normal flora 06/06/2016 sputum- normal flora    Echocardiogram 08/08/2014: LV ejection fraction 60 to 65%,  grade 1 diastolic dysfunction, PA peak pressure 38     Assessment & Plan:     ICD-10-CM   1. COPD (chronic obstructive pulmonary disease) with acute bronchitis (HCC)  J44.0 6 minute walk   J20.9 sodium chloride HYPERTONIC 3 % nebulizer solution    Respiratory Therapy Supplies (FLUTTER) DEVI    Fluticasone-Salmeterol (ADVAIR DISKUS) 250-50 MCG/DOSE AEPB    umeclidinium bromide (INCRUSE ELLIPTA) 62.5 MCG/INH AEPB    albuterol (VENTOLIN HFA) 108 (90 Base) MCG/ACT inhaler  2. Chronic respiratory failure with hypoxia (HCC)  J96.11 6 minute walk    Fluticasone-Salmeterol (ADVAIR DISKUS) 250-50 MCG/DOSE AEPB  3. Non-seasonal allergic rhinitis due to pollen  J30.1    COPD- GOLD 4D -Up-to-date on seasonal flu and pneumococcal vaccinations -Continue Advair and Incruse; reinforced correct inhaler technique. -Continue Mucinex. Need  to restart hypertonic saline nebs BID. Prescribed an aerobika flutter valve to use in-line with nebulizers twice daily for airway clearance. -Continue albuterol as needed -Discussed pulmonary rehab, which she has reservations about due to her risk of being exposed to [place] functional status with frequent mobility -Continue social distancing, handwashing, mask wearing per COVID precautions  Chronic hypoxic respiratory failure due to COPD -6-minute walk -Home oxygen prescribed for portable concentrator at 2 L/min   Allergic rhinosinusitis -Continue Singulair -Can restart antihistamine and Flonase as needed - Recommended against the use of Afrin due to the risk of tachyphylaxis with rebound congestion  Pulmonary nodules -I had a discussion with her regarding the risks and benefits of ongoing surveillance for lung cancer.  She feels very strongly that she would be high risk for diagnostic interventions and would be unlikely to be a candidate for treatment, and she does not wish to pursue additional surveillance imaging.  She understands the risk that she  could develop advanced lung cancer.  She is correct that she would be a high risk procedural candidate.   Current Outpatient Medications:  .  albuterol (PROVENTIL) (2.5 MG/3ML) 0.083% nebulizer solution, USE 1 VIAL VIA NEBULIZER EVERY 2 HOURS AS NEEDED FOR SHORTNESS OF BREATH, Disp: 375 mL, Rfl: 5 .  albuterol (VENTOLIN HFA) 108 (90 Base) MCG/ACT inhaler, Inhale 2 puffs into the lungs every 6 (six) hours as needed for wheezing or shortness of breath., Disp: 6.7 g, Rfl: 11 .  ALPRAZolam (XANAX) 0.5 MG tablet, Take 0.5 mg by mouth at bedtime as needed for anxiety., Disp: , Rfl:  .  atorvastatin (LIPITOR) 10 MG tablet, Take 1 tablet (10 mg total) by mouth daily., Disp: 90 tablet, Rfl: 3 .  Calcium Carbonate-Vitamin D (CALCIUM-VITAMIN D) 500-200 MG-UNIT tablet, Take 1 tablet by mouth daily., Disp: , Rfl:  .  citalopram (CELEXA) 10 MG tablet, TAKE 1 TABLET EVERY DAY (Patient taking differently: Take 10 mg by mouth at bedtime. ), Disp: 90 tablet, Rfl: 3 .  fluticasone (FLONASE) 50 MCG/ACT nasal spray, Place 2 sprays into both nostrils daily., Disp: 16 g, Rfl: 5 .  Fluticasone-Salmeterol (ADVAIR DISKUS) 250-50 MCG/DOSE AEPB, INHALE 1 PUFF INTO THE LUNGS EVERY 12 HOURS., Disp: 180 each, Rfl: 2 .  guaiFENesin (MUCINEX) 600 MG 12 hr tablet, Take 1,200 mg by mouth 2 (two) times daily. , Disp: , Rfl:  .  montelukast (SINGULAIR) 10 MG tablet, TAKE 1 TABLET BY MOUTH EVERYDAY AT BEDTIME, Disp: 90 tablet, Rfl: 1 .  Multiple Vitamin (MULTI VITAMIN DAILY PO), Take by mouth., Disp: , Rfl:  .  OXYGEN, Inhale 2 L into the lungs., Disp: , Rfl:  .  oxymetazoline (AFRIN) 0.05 % nasal spray, Place 1 spray into both nostrils 2 (two) times daily as needed for congestion., Disp: , Rfl:  .  sodium chloride HYPERTONIC 3 % nebulizer solution, Take by nebulization 2 (two) times daily as needed for other., Disp: 360 mL, Rfl: 11 .  umeclidinium bromide (INCRUSE ELLIPTA) 62.5 MCG/INH AEPB, Inhale 1 puff into the lungs daily., Disp:  90 each, Rfl: 3 .  busPIRone (BUSPAR) 7.5 MG tablet, Take 7.5 mg by mouth 3 (three) times daily., Disp: , Rfl:  .  pantoprazole (PROTONIX) 40 MG tablet, Take 1 tablet (40 mg total) by mouth daily at 6 (six) AM for 30 days. (Patient not taking: Reported on 09/28/2018), Disp: 30 tablet, Rfl: 0 .  Respiratory Therapy Supplies (FLUTTER) DEVI, 1 each by Does not apply route 2 (two)  times daily., Disp: 1 each, Rfl: 0   Julian Hy, DO [facility] 03/18/2019 2:16 PM

## 2019-03-18 NOTE — Patient Instructions (Addendum)
Thank you for visiting Dr. Carlis Abbott at [facility]. We recommend the following: Orders Placed This Encounter  Procedures  . 6 minute walk   Orders Placed This Encounter  Procedures  . 6 minute walk    Standing Status:   Future    Standing Expiration Date:   03/17/2020    Order Specific Question:   Where should this test be performed?    Answer:   [facility]    Meds ordered this encounter  Medications  . sodium chloride HYPERTONIC 3 % nebulizer solution    Sig: Take by nebulization 2 (two) times daily as needed for other.    Dispense:  360 mL    Refill:  11  . Respiratory Therapy Supplies (FLUTTER) DEVI    Sig: 1 each by Does not apply route 2 (two) times daily.    Dispense:  1 each    Refill:  0    aerobika  . Fluticasone-Salmeterol (ADVAIR DISKUS) 250-50 MCG/DOSE AEPB    Sig: INHALE 1 PUFF INTO THE LUNGS EVERY 12 HOURS.    Dispense:  180 each    Refill:  2  . umeclidinium bromide (INCRUSE ELLIPTA) 62.5 MCG/INH AEPB    Sig: Inhale 1 puff into the lungs daily.    Dispense:  90 each    Refill:  3  . albuterol (VENTOLIN HFA) 108 (90 Base) MCG/ACT inhaler    Sig: Inhale 2 puffs into the lungs every 6 (six) hours as needed for wheezing or shortness of breath.    Dispense:  6.7 g    Refill:  11    Use your saline nebulizers with one of your flutter valves two times daily.  You can still use your breather to strengthen your muscles in addition to this.  Return in about 3 months (around 06/18/2019).    Please do your part to reduce the spread of COVID-19.

## 2019-03-18 NOTE — Progress Notes (Signed)
SIX MIN WALK 03/18/2019 05/07/2017 05/07/2017 07/11/2016 07/20/2015 10/07/2014 08/26/2014  Medications Xanax 0.5mg  - 1pm. Buspar 7.5mg , Celexa 10mg , Advair 250/50, Singulair 10mg  - taken at 10:00 - - - - - -  Supplimental Oxygen during Test? (L/min) Yes Yes No No No No No  O2 Flow Rate 2 2 - - - - -  Type Continuous Continuous - - - - -  Laps 5 - - - - - -  Partial Lap (in Meters) 0 - - - - - -  Baseline BP (sitting) 118/64 - - - - - -  Baseline Heartrate 74 - - - - - -  Baseline Dyspnea (Borg Scale) 2 - - - - - -  Baseline Fatigue (Borg Scale) 0 - - - - - -  Baseline SPO2 100 - - - - - -  BP (sitting) 138/70 - - - - - -  Heartrate 98 - - - - - -  Dyspnea (Borg Scale) 3 - - - - - -  Fatigue (Borg Scale) 3 - - - - - -  SPO2 88 - - - - - -  BP (sitting) 120/68 - - - - - -  Heartrate 82 - - - - - -  SPO2 98 - - - - - -  Stopped or Paused before Six Minutes Yes - - - - - -  Other Symptoms at end of Exercise Leg weakness, SOB - - - - - -  Distance Completed 170 - - - - - -  Tech Comments: There was 1:54 left of the walk. - pt's O2 dropped so she was placed on 2lpm O2 Patient was able to complete 2 laps without oxygen. Patient did have complaints of SOB and hip pain during walk.  pt walked a moderate pace, tolerated walk well.  pt did do some pursed-lip breathing during 3rd lap of walk test.  Pt's lowest ambulatory stat was 91 on RA. Pt walked at a moderate pace with no breaks. pt walked a slow to moderate pace, tolerated walk well.

## 2019-03-18 NOTE — Telephone Encounter (Signed)
Message received from [facility] at [facility] -       We will be special ordering this, however, it can take up to 4 weeks to get it. We are trying to see if we can get it quicker than that. Sorry there has been so much back and forth on this. I was advised we didn't carry them and we were not aware until Melissa mentioned special ordering them, that was an option.

## 2019-03-19 NOTE — Telephone Encounter (Signed)
Spoke to patient.  Let her aware of update on device Patient voiced understanding

## 2019-03-30 ENCOUNTER — Encounter: Payer: Self-pay | Admitting: Critical Care Medicine

## 2019-03-30 NOTE — Progress Notes (Signed)
I completed the requested [employer] physician verification form, which will be mailed out tomorrow.  Julian Hy, DO 03/30/19 5:36 PM [facility]

## 2019-03-31 ENCOUNTER — Telehealth: Payer: Self-pay | Admitting: Critical Care Medicine

## 2019-03-31 DIAGNOSIS — J209 Acute bronchitis, unspecified: Secondary | ICD-10-CM

## 2019-03-31 MED ORDER — ALBUTEROL SULFATE HFA 108 (90 BASE) MCG/ACT IN AERS: 2.0000 | INHALATION_SPRAY | Freq: Four times a day (QID) | RESPIRATORY_TRACT | 3 refills | Status: DC | PRN

## 2019-03-31 NOTE — Telephone Encounter (Signed)
Called and spoke with Patient.  Patient stated she had previously received her medications from a [facility].  Patient requested Albuterol inhaler 90 days supply be faxed to [phone]. Patient stated she has placed the order and [facility] is waiting for request. Albuterol prescription printed, signed by Dr. Carlis Abbott, and faxed to [facility] per Patient request. Nothing further at this time.

## 2019-04-16 ENCOUNTER — Other Ambulatory Visit: Payer: Self-pay | Admitting: Pulmonary Disease

## 2019-04-16 ENCOUNTER — Other Ambulatory Visit: Payer: Self-pay | Admitting: Family Medicine

## 2019-04-16 DIAGNOSIS — F411 Generalized anxiety disorder: Secondary | ICD-10-CM

## 2019-07-12 ENCOUNTER — Other Ambulatory Visit: Payer: Self-pay | Admitting: Family Medicine

## 2019-07-12 NOTE — Telephone Encounter (Signed)
Pt called requesting to fill buspirone . Pt says she has been out for three days. [place]

## 2019-07-12 NOTE — Telephone Encounter (Signed)
[facility] is [facility] to [facility] buspar. Please advised. [place]

## 2019-07-12 NOTE — Telephone Encounter (Signed)
Pt called and states she needs this filled ASAP. She has been out for 3 days. [facility] to [facility].

## 2019-08-03 ENCOUNTER — Other Ambulatory Visit: Payer: Self-pay | Admitting: Family Medicine

## 2019-08-03 NOTE — Telephone Encounter (Signed)
[facility] pt buspar. Please advise [facility]

## 2019-09-13 ENCOUNTER — Telehealth: Payer: Self-pay | Admitting: Critical Care Medicine

## 2019-09-13 NOTE — Telephone Encounter (Signed)
Spoke with pt. She is following up on her handicap placard from. States that her sister dropped it off at the front desk last week.  Mandi - do you have this form? Thanks.

## 2019-09-13 NOTE — Telephone Encounter (Signed)
The form has been located and is in folder for Dr. Carlis Abbott to sign. She will be in the office starting tomorrow for the rest of the week. Will get her to sign this tomorrow.

## 2019-09-14 ENCOUNTER — Encounter: Payer: Self-pay | Admitting: Critical Care Medicine

## 2019-09-14 NOTE — Telephone Encounter (Signed)
Dr. Carlis Abbott has signed form, I called patient and left voicemail to let patient know that it is ready to be picked up. Placed in envelope and is up front. Nothing further needed at this time.

## 2019-09-14 NOTE — Progress Notes (Signed)
Handicapped parking form completed.  Julian Hy, DO 09/14/19 9:05 AM [facility]

## 2019-10-12 ENCOUNTER — Other Ambulatory Visit: Payer: Medicare Other

## 2019-10-12 ENCOUNTER — Encounter: Payer: Self-pay | Admitting: Critical Care Medicine

## 2019-10-12 ENCOUNTER — Ambulatory Visit (INDEPENDENT_AMBULATORY_CARE_PROVIDER_SITE_OTHER): Payer: Medicare Other | Admitting: Critical Care Medicine

## 2019-10-12 ENCOUNTER — Ambulatory Visit (INDEPENDENT_AMBULATORY_CARE_PROVIDER_SITE_OTHER): Payer: Medicare Other

## 2019-10-12 ENCOUNTER — Other Ambulatory Visit: Payer: Self-pay

## 2019-10-12 VITALS — BP 126/74 | HR 112 | Temp 97.0°F | Ht 67.0 in | Wt 153.8 lb

## 2019-10-12 DIAGNOSIS — J189 Pneumonia, unspecified organism: Secondary | ICD-10-CM | POA: Diagnosis not present

## 2019-10-12 DIAGNOSIS — J449 Chronic obstructive pulmonary disease, unspecified: Secondary | ICD-10-CM | POA: Diagnosis not present

## 2019-10-12 DIAGNOSIS — R918 Other nonspecific abnormal finding of lung field: Secondary | ICD-10-CM | POA: Diagnosis not present

## 2019-10-12 MED ORDER — LEVOFLOXACIN 750 MG PO TABS: 750.0000 mg | ORAL_TABLET | Freq: Every day | ORAL | 0 refills | Status: AC

## 2019-10-12 NOTE — Patient Instructions (Addendum)
Thank you for visiting Dr. Carlis Abbott at [facility]. We recommend the following: Orders Placed This Encounter  Procedures  . Respiratory or Resp and Sputum Culture   Orders Placed This Encounter  Procedures  . Respiratory or Resp and Sputum Culture    Standing Status:   Future    Standing Expiration Date:   10/11/2020    Meds ordered this encounter  Medications  . levofloxacin (LEVAQUIN) 750 MG tablet    Sig: Take 1 tablet (750 mg total) by mouth daily for 7 days.    Dispense:  7 tablet    Refill:  0   Follow up as previously scheduled on 5/10.    Please do your part to reduce the spread of COVID-19.

## 2019-10-12 NOTE — Progress Notes (Signed)
Synopsis: Referred in March 2018 for COPD by [name], MD. Previously a patient of Dr. Lake Bells.  Subjective:   PATIENT ID: Courtney Ayers GENDER: female DOB: January 19, 1946, MRN: [id number]  Chief Complaint  Patient presents with  . Follow-up    pt is using the rescuye inhaler daily has continous sob. pt states when breathingin on left side hurts    Courtney Ayers presents for an acute visit to evaluate 2 days of worsening shortness of breath, increased sputum production, discolored sputum, right-sided chest and back pain with coughing, headache, and lower saturations when she wakes up in the morning on her 2L home oxygen.  No wheezing.  She remains on Advair twice daily and increase daily.  She has been using her nebulizer more frequently to help clear sputum.  She continues to use hypertonic saline twice daily.  She has not had a fever but had a temperature of 99.8 this morning at home.  She has continued using her home 2 L of oxygen, but her daughter said her sats were in the high 41s and low 80s when she woke up this morning, which is unusual for her.  When she has taken prednisone in the past she has not tolerated it well due to vomiting.  No antibiotic allergies.    OV 03/18/2019: Courtney Ayers is a 74 y/o woman being seen to follow up for COPD and chronic hypoxic respiratory failure.  She has previously a patient of Dr. Anastasia Pall since 2016.  Her most recent hospitalization was in March 2020.  She quit smoking in 2016 after 45 pack years.  She is on triple inhaled therapy with Advair and Incruse. She uses her rescue inhaler 1-2 times daily most days. She continues to have significant thick sputum, and she has been taking mucinex regularly but not using hypertonic saline nebs. She infrequently uses her flutter valve. Instead she has been using a "breather" device she was given at rehab to strengthen her respiratory muscles, which she uses diligently.  Since discharge from rehab about 3 weeks  after her hospitalization this year, hse has been able to get back to her IADLs and ADLs, although she rests frequently due to dyspnea. At home when she checks her saturations on her home 2L, her saturations are in the upper 90s. Although it has been discussed, she has never done pulmonary rehab in the past.  She has been on 2 L of home oxygen since her hospitalization in February 2016.  In 2017 showed overnight oximetry demonstrating desaturation to 84%.  She wants to purchase a portable oxygen concentrator, and she needs to try walking on this today to verify that her oxygen concentration is stable.    She has chronic allergic rhinosinusitis for which she takes Singulair, but she has not recently been using Flonase or an antihistamine. She occasionally uses afrin nasal spray.  She has received her seasonal flu shot this year.   Past Medical History:  Diagnosis Date  . Adenomatous colon polyp   . Allergic rhinitis, cause unspecified    seasonal  . COPD (chronic obstructive pulmonary disease) ([place])    no home O2  . Depressive disorder, not elsewhere classified 2006   some anxiety recurs when she has stopped med in past  . OA (osteoarthritis)    B THUMB, RIGHT > LEFT HIP, BACK  . Osteopenia      Family History  Problem Relation Age of Onset  . Cancer Mother  bladder, metastatic  . Alcohol abuse Father   . Heart disease Father        CHF  . Hyperlipidemia Father   . Hypertension Father   . Aortic aneurysm Sister   . Crohn's disease Sister   . Hyperlipidemia Sister   . Cancer Sister        brain cancer in her 61's  . Diabetes Maternal Uncle   . Cancer Maternal Grandmother 98       pancreatic  . Cancer Maternal Grandfather        bladder cancer in 35's  . Stroke Paternal Grandmother   . Heart disease Paternal Grandfather        MI later 58's, early 42's     Past Surgical History:  Procedure Laterality Date  . BREAST ENHANCEMENT SURGERY  1978   implants placed '78,  removed and replaced  approx '95 (Dr. Wendy Poet)  . COLONOSCOPY  08/2011  . COSMETIC SURGERY     LIPOSUCTION; other facial cosmetic surgery  . GANGLION CYST EXCISION    . [place] HERNIA REPAIR  2004   right, and ganglion cyst removal  . LIPOMA EXCISION    . MELANOMA EXCISION  2013   R upper arm  . NASAL SEPTUM SURGERY  1987   for deviated septum (Dr. Wendy Poet)  . TONSILLECTOMY AND ADENOIDECTOMY  1951  . VIDEO BRONCHOSCOPY Bilateral 08/08/2015   Procedure: VIDEO BRONCHOSCOPY WITHOUT FLUORO;  Surgeon: Juanito Doom, MD;  Location: [facility] ENDOSCOPY;  Service: Cardiopulmonary;  Laterality: Bilateral;    Social History   Socioeconomic History  . Marital status: Single    Spouse name: Not on file  . Number of children: Not on file  . Years of education: Not on file  . Highest education level: Not on file  Occupational History  . Not on file  Tobacco Use  . Smoking status: Former Smoker    Packs/day: 1.00    Years: 45.00    Pack years: 45.00    Types: Cigarettes    Quit date: 08/05/2014    Years since quitting: 5.1  . Smokeless tobacco: Never Used  Substance and Sexual Activity  . Alcohol use: Yes    Alcohol/week: 0.0 standard drinks    Comment: 2 drinks per night  . Drug use: No  . Sexual activity: Not Currently    Partners: Female  Other Topics Concern  . Not on file  Social History Narrative   Lives with other members of the family, 5 cat in the home, 2 dog outside   Social Determinants of Health   Financial Resource Strain:   . Difficulty of Paying Living Expenses:   Food Insecurity:   . Worried About Charity fundraiser in the Last Year:   . Arboriculturist in the Last Year:   Transportation Needs:   . Film/video editor (Medical):   [name] Lack of Transportation (Non-Medical):   Physical Activity:   . Days of Exercise per Week:   . Minutes of Exercise per Session:   Stress:   . Feeling of Stress :   Social Connections:   . Frequency of Communication with  Friends and Family:   . Frequency of Social Gatherings with Friends and Family:   . Attends Religious Services:   . Active Member of Clubs or Organizations:   . Attends Archivist Meetings:   [name] Marital Status:   Intimate Partner Violence:   . Fear of Current or Ex-Partner:   .  Emotionally Abused:   [name] Physically Abused:   . Sexually Abused:      No Known Allergies   Immunization History  Administered Date(s) Administered  . Fluad Quad(high Dose 65+) 02/25/2019  . Influenza Split 07/03/2011, 02/14/2012  . Influenza Whole 05/16/2004  . Influenza, High Dose Seasonal PF 03/18/2013, 03/24/2014, 03/08/2015  . Influenza,inj,Quad PF,6+ Mos 03/21/2016, 05/07/2017  . Influenza-Unspecified 03/27/2018  . PFIZER SARS-COV-2 Vaccination 08/03/2019, 08/24/2019  . PPD Test 05/30/1999  . Pneumococcal Conjugate-13 03/24/2014, 02/12/2019  . Pneumococcal Polysaccharide-23 08/06/2000, 01/30/2011  . Td 09/23/2001  . Tdap 01/30/2011  . Zoster 03/05/2011  . Zoster Recombinat (Shingrix) 12/20/2016, 03/27/2018    Outpatient Medications Prior to Visit  Medication Sig Dispense Refill  . albuterol (PROVENTIL) (2.5 MG/3ML) 0.083% nebulizer solution USE 1 VIAL VIA NEBULIZER EVERY 2 HOURS AS NEEDED FOR SHORTNESS OF BREATH 375 mL 5  . albuterol (VENTOLIN HFA) 108 (90 Base) MCG/ACT inhaler Inhale 2 puffs into the lungs every 6 (six) hours as needed for wheezing or shortness of breath. 20.1 g 3  . ALPRAZolam (XANAX) 0.5 MG tablet Take 0.5 mg by mouth at bedtime as needed for anxiety.    [name] atorvastatin (LIPITOR) 10 MG tablet Take 1 tablet (10 mg total) by mouth daily. 90 tablet 3  . busPIRone (BUSPAR) 7.5 MG tablet TAKE 1 TABLET BY MOUTH THREE TIMES A DAY 270 tablet 2  . Calcium Carbonate-Vitamin D (CALCIUM-VITAMIN D) 500-200 MG-UNIT tablet Take 1 tablet by mouth daily.    . citalopram (CELEXA) 10 MG tablet TAKE 1 TABLET BY MOUTH EVERY DAY 90 tablet 3  . fluticasone (FLONASE) 50 MCG/ACT nasal spray  Place 2 sprays into both nostrils daily. 16 g 5  . Fluticasone-Salmeterol (ADVAIR DISKUS) 250-50 MCG/DOSE AEPB INHALE 1 PUFF INTO THE LUNGS EVERY 12 HOURS. 180 each 2  . guaiFENesin (MUCINEX) 600 MG 12 hr tablet Take 1,200 mg by mouth 2 (two) times daily.     . montelukast (SINGULAIR) 10 MG tablet TAKE 1 TABLET BY MOUTH EVERYDAY AT BEDTIME 90 tablet 1  . Multiple Vitamin (MULTI VITAMIN DAILY PO) Take by mouth.    . OXYGEN Inhale 2 L into the lungs.    . sodium chloride HYPERTONIC 3 % nebulizer solution Take by nebulization 2 (two) times daily as needed for other. 360 mL 11  . umeclidinium bromide (INCRUSE ELLIPTA) 62.5 MCG/INH AEPB Inhale 1 puff into the lungs daily. 90 each 3  . oxymetazoline (AFRIN) 0.05 % nasal spray Place 1 spray into both nostrils 2 (two) times daily as needed for congestion.    . pantoprazole (PROTONIX) 40 MG tablet Take 1 tablet (40 mg total) by mouth daily at 6 (six) AM for 30 days. (Patient not taking: Reported on 09/28/2018) 30 tablet 0   No facility-administered medications prior to visit.    Review of Systems  Constitutional: Negative for chills, fever and weight loss.  HENT: Negative.   Eyes: Negative for blurred vision and double vision.  Respiratory: Positive for cough, sputum production and shortness of breath. Negative for hemoptysis.   Cardiovascular: Negative for chest pain and leg swelling.  Gastrointestinal: Negative for diarrhea, nausea and vomiting.  Genitourinary: Negative.   Musculoskeletal: Negative for joint pain and myalgias.  Skin: Negative for rash.  Neurological: Negative.   Endo/Heme/Allergies: Negative.      Objective:   Vitals:   10/12/19 1204  BP: 126/74  Pulse: (!) 112  Temp: (!) 97 F (36.1 C)  TempSrc: Temporal  SpO2: 98%  Weight:  153 lb 12.8 oz (69.8 kg)  Height: 5\' 7"  (1.702 m)   98% on 2 LPM  BMI Readings from Last 3 Encounters:  10/12/19 24.09 kg/m  03/18/19 23.39 kg/m  02/25/19 23.16 kg/m   Wt Readings from  Last 3 Encounters:  10/12/19 153 lb 12.8 oz (69.8 kg)  03/18/19 148 lb 3.2 oz (67.2 kg)  02/25/19 146 lb 12.8 oz (66.6 kg)    Physical Exam Vitals reviewed.  Constitutional:      General: She is not in acute distress. HENT:     Head: Normocephalic and atraumatic.  Eyes:     General: No scleral icterus. Cardiovascular:     Rate and Rhythm: Regular rhythm. Tachycardia present.  Pulmonary:     Comments: Purse lipped breathing, tachypnea. No accessory muscle use. Distant breath sounds, no wheezing or rhales.  Abdominal:     General: There is no distension.  Musculoskeletal:        General: No swelling or deformity.     Cervical back: Neck supple.  Lymphadenopathy:     Cervical: No cervical adenopathy.  Skin:    General: Skin is warm and dry.     Findings: No rash.  Neurological:     General: No focal deficit present.     Mental Status: She is alert.     Coordination: Coordination normal.  Psychiatric:        Mood and Affect: Mood normal.        Behavior: Behavior normal.      CBC    Component Value Date/Time   WBC 6.5 02/25/2019 1521   WBC 9.3 09/03/2018 0440   RBC 4.30 02/25/2019 1521   RBC 3.46 (L) 09/03/2018 0440   HGB 13.1 02/25/2019 1521   HCT 39.5 02/25/2019 1521   PLT 206 02/25/2019 1521   MCV 92 02/25/2019 1521   MCH 30.5 02/25/2019 1521   MCH 30.3 09/03/2018 0440   MCHC 33.2 02/25/2019 1521   MCHC 29.7 (L) 09/03/2018 0440   RDW 13.2 02/25/2019 1521   LYMPHSABS 1.3 02/25/2019 1521   MONOABS 0.6 09/02/2018 0508   EOSABS 0.0 02/25/2019 1521   BASOSABS 0.0 02/25/2019 1521     Chest Imaging- films reviewed: CXR 08/14/2018- Increased basilar lung markings. Hyperinflated with flattened hemidiaphragms.  CXR 10/12/2019-mild right upper lobe opacity in the apex and lower right upper lobe.  Chronic hyperinflation.  Pulmonary Functions Testing Results: PFT Results Latest Ref Rng & Units 10/07/2014  FVC-Pre L 2.36  FVC-Predicted Pre % 67  FVC-Post L 2.51   FVC-Predicted Post % 72  Pre FEV1/FVC % % 33  Post FEV1/FCV % % 33  FEV1-Pre L 0.77  FEV1-Predicted Pre % 29  FEV1-Post L 0.82  DLCO UNC% % 25  DLCO COR %Predicted % 26  TLC L 7.07  TLC % Predicted % 125  RV % Predicted % 180  Very severe obstruction with severe diffusion impairment.  Pathology:  BAL left lower lobe-no malignancy  Micro: Fungus culture 11/28/2015-Growth of Penicillium species, Growth of An arthrospore-producing mold, not Coccidioides immitis  08/08/2015 BAL- Aspergillus ochraceus  08/08/2015 BAL AFB- negative 08/08/2015 BAL- normal flora 06/06/2016 sputum- normal flora    Echocardiogram 08/08/2014: LV ejection fraction 60 to [id number], grade 1 diastolic dysfunction, PA peak pressure 38     Assessment & Plan:     ICD-10-CM   1. Pneumonia of right upper lobe due to infectious organism  J18.9 Respiratory or Resp and Sputum Culture    Right upper lobe community-acquired  pneumonia -Levofloxacin 750 mg x 7 days -Sputum culture obtained in clinic today -Discussed ER precautions if she fails to improve or worsens-discussed with her daughter who is a Marine scientist and her sister who are present with her at the visit today. -Turn home oxygen concentrator to 4 L while sleeping for the next few days until this resolves. -Keep follow-up appointment on 5/10 -Continue all inhalers  COPD- GOLD 4D -Up-to-date on seasonal flu, Covid, and pneumococcal vaccinations -Continue Advair and Incruse -Continue Mucinex.  Continue hypertonic saline nebs BID with aerobika flutter valve to use in-line with nebulizers twice daily for airway clearance. -Continue albuterol as needed -Previously have discussed pulmonary rehab; can revisit at future appointments  Chronic hypoxic respiratory failure due to COPD -Needs to requalify per her insurance at her next visit. -4 L of oxygen to sleep during acute episode of pneumonia, subsequently should go back to her prescribed 2 L.   Not discussed  today:  Allergic rhinosinusitis -Continue Singulair -Can restart antihistamine and Flonase as needed - Recommended against the use of Afrin due to the risk of tachyphylaxis with rebound congestion  Pulmonary nodules -I had a discussion with her regarding the risks and benefits of ongoing surveillance for lung cancer.  She feels very strongly that she would be high risk for diagnostic interventions and would be unlikely to be a candidate for treatment, and she does not wish to pursue additional surveillance imaging.  She understands the risk that she could develop advanced lung cancer.  She is correct that she would be a high risk procedural candidate.  RTC as scheduled on 5/10.    Current Outpatient Medications:  .  albuterol (PROVENTIL) (2.5 MG/3ML) 0.083% nebulizer solution, USE 1 VIAL VIA NEBULIZER EVERY 2 HOURS AS NEEDED FOR SHORTNESS OF BREATH, Disp: 375 mL, Rfl: 5 .  albuterol (VENTOLIN HFA) 108 (90 Base) MCG/ACT inhaler, Inhale 2 puffs into the lungs every 6 (six) hours as needed for wheezing or shortness of breath., Disp: 20.1 g, Rfl: 3 .  ALPRAZolam (XANAX) 0.5 MG tablet, Take 0.5 mg by mouth at bedtime as needed for anxiety., Disp: , Rfl:  .  atorvastatin (LIPITOR) 10 MG tablet, Take 1 tablet (10 mg total) by mouth daily., Disp: 90 tablet, Rfl: 3 .  busPIRone (BUSPAR) 7.5 MG tablet, TAKE 1 TABLET BY MOUTH THREE TIMES A DAY, Disp: 270 tablet, Rfl: 2 .  Calcium Carbonate-Vitamin D (CALCIUM-VITAMIN D) 500-200 MG-UNIT tablet, Take 1 tablet by mouth daily., Disp: , Rfl:  .  citalopram (CELEXA) 10 MG tablet, TAKE 1 TABLET BY MOUTH EVERY DAY, Disp: 90 tablet, Rfl: 3 .  fluticasone (FLONASE) 50 MCG/ACT nasal spray, Place 2 sprays into both nostrils daily., Disp: 16 g, Rfl: 5 .  Fluticasone-Salmeterol (ADVAIR DISKUS) 250-50 MCG/DOSE AEPB, INHALE 1 PUFF INTO THE LUNGS EVERY 12 HOURS., Disp: 180 each, Rfl: 2 .  guaiFENesin (MUCINEX) 600 MG 12 hr tablet, Take 1,200 mg by mouth 2 (two) times  daily. , Disp: , Rfl:  .  montelukast (SINGULAIR) 10 MG tablet, TAKE 1 TABLET BY MOUTH EVERYDAY AT BEDTIME, Disp: 90 tablet, Rfl: 1 .  Multiple Vitamin (MULTI VITAMIN DAILY PO), Take by mouth., Disp: , Rfl:  .  OXYGEN, Inhale 2 L into the lungs., Disp: , Rfl:  .  sodium chloride HYPERTONIC 3 % nebulizer solution, Take by nebulization 2 (two) times daily as needed for other., Disp: 360 mL, Rfl: 11 .  umeclidinium bromide (INCRUSE ELLIPTA) 62.5 MCG/INH AEPB, Inhale 1 puff into the  lungs daily., Disp: 90 each, Rfl: 3 .  levofloxacin (LEVAQUIN) 750 MG tablet, Take 1 tablet (750 mg total) by mouth daily for 7 days., Disp: 7 tablet, Rfl: 0   Julian Hy, DO [facility] 10/12/2019 12:46 PM

## 2019-10-13 NOTE — Progress Notes (Signed)
Patient called and identification verified. Message from [place] reviewed.  Chest x-ray showing potential pneumonia.  Patient was seen in our office yesterday on 10/12/2019 and treated with Levaquin as well as a sputum culture was performed.  She has close follow-up with our office.  Do not have any new recommendations based off of this.  I would like to emphasize as Dr. Carlis Abbott reviewed with the patient that if symptoms worsen or if the patient fails to improve despite outpatient antibiotic therapy she needs to present to the emergency room for further evaluation.  Patient verbalized understanding of results and ongoing plan of care.

## 2019-10-13 NOTE — Progress Notes (Signed)
Thank you Aaron Edelman. Yes, I reviewed the scan with the patient during her visit and ER precautions were explained to the patient and her family per my note.

## 2019-10-15 LAB — RESPIRATORY CULTURE OR RESPIRATORY AND SPUTUM CULTURE
MICRO NUMBER:: 10437105
RESULT:: NORMAL
SPECIMEN QUALITY:: ADEQUATE

## 2019-10-15 NOTE — Progress Notes (Signed)
Please let [name] know that her sputum culture didn't grow anything, so we are not going to change her antibiotics. She should complete the course she was prescribed this week. Thanks!

## 2019-10-18 ENCOUNTER — Encounter: Payer: Self-pay | Admitting: Critical Care Medicine

## 2019-10-18 ENCOUNTER — Ambulatory Visit (INDEPENDENT_AMBULATORY_CARE_PROVIDER_SITE_OTHER): Payer: Medicare Other | Admitting: Critical Care Medicine

## 2019-10-18 ENCOUNTER — Other Ambulatory Visit: Payer: Self-pay

## 2019-10-18 VITALS — BP 142/64 | HR 94 | Temp 97.4°F | Ht 67.5 in | Wt 154.4 lb

## 2019-10-18 DIAGNOSIS — J9611 Chronic respiratory failure with hypoxia: Secondary | ICD-10-CM | POA: Diagnosis not present

## 2019-10-18 NOTE — Patient Instructions (Addendum)
Thank you for visiting Dr. Carlis Abbott at [facility]. We recommend the following: Orders Placed This Encounter  Procedures  . Pulse oximetry, overnight   Orders Placed This Encounter  Procedures  . Pulse oximetry, overnight    On 2L oxygen    Standing Status:   Future    Standing Expiration Date:   10/17/2020      Return in about 2 months (around 12/18/2019).    Please do your part to reduce the spread of COVID-19.

## 2019-10-18 NOTE — Progress Notes (Signed)
Synopsis: Referred in March 2018 for COPD by [name], MD. Previously a patient of Dr. Lake Bells.  Subjective:   PATIENT ID: Courtney Ayers GENDER: female DOB: 07-20-1945, MRN: [id number]  Chief Complaint  Patient presents with  . Follow-up    SOB has stayed the same since last visit, lung pain decreaed    Courtney Ayers is a 74 y/o woman with a history of severe COPD and chronic hypoxic respiratory failure presents for follow-up.  She is accompanied today by her sister.  She has completed her levofloxacin this morning.  She still feels fatigued and has been sleeping a lot.  She remains short of breath and not back to her previous baseline.  She is now wheezing.  Her cough is improved but not back to baseline.  She continues to have sputum, but is having less and it is getting harder to clear.  No fevers.  Her right-sided chest pain is improved.  She has been thirsty and drinking a lot of water but had a poor appetite.  She has been urinating frequently.  She continues to use her flutter valve and spirometer, and using respirometer as needed to help with her symptoms.  She is using her albuterol 2-3 times per day in addition to present Advair.    OV 10/12/19: Ms. Courtney Ayers presents for an acute visit to evaluate 2 days of worsening shortness of breath, increased sputum production, discolored sputum, right-sided chest and back pain with coughing, headache, and lower saturations when she wakes up in the morning on her 2L home oxygen.  No wheezing.  She remains on Advair twice daily and increase daily.  She has been using her nebulizer more frequently to help clear sputum.  She continues to use hypertonic saline twice daily.  She has not had a fever but had a temperature of 99.8 this morning at home.  She has continued using her home 2 L of oxygen, but her daughter said her sats were in the high 60s and low 80s when she woke up this morning, which is unusual for her.  When she has taken prednisone in  the past she has not tolerated it well due to vomiting.  No antibiotic allergies.   OV 03/18/2019: Ms. [name] is a 74 y/o woman being seen to follow up for COPD and chronic hypoxic respiratory failure.  She has previously a patient of Dr. Anastasia Pall since 2016.  Her most recent hospitalization was in March 2020.  She quit smoking in 2016 after 45 pack years.  She is on triple inhaled therapy with Advair and Incruse. She uses her rescue inhaler 1-2 times daily most days. She continues to have significant thick sputum, and she has been taking mucinex regularly but not using hypertonic saline nebs. She infrequently uses her flutter valve. Instead she has been using a "breather" device she was given at rehab to strengthen her respiratory muscles, which she uses diligently.  Since discharge from rehab about 3 weeks after her hospitalization this year, hse has been able to get back to her IADLs and ADLs, although she rests frequently due to dyspnea. At home when she checks her saturations on her home 2L, her saturations are in the upper 90s. Although it has been discussed, she has never done pulmonary rehab in the past.  She has been on 2 L of home oxygen since her hospitalization in February 2016.  In 2017 showed overnight oximetry demonstrating desaturation to 84%.  She wants to purchase a portable  oxygen concentrator, and she needs to try walking on this today to verify that her oxygen concentration is stable.    She has chronic allergic rhinosinusitis for which she takes Singulair, but she has not recently been using Flonase or an antihistamine. She occasionally uses afrin nasal spray.  She has received her seasonal flu shot this year.   Past Medical History:  Diagnosis Date  . Adenomatous colon polyp   . Allergic rhinitis, cause unspecified    seasonal  . COPD (chronic obstructive pulmonary disease) ([place])    no home O2  . Depressive disorder, not elsewhere classified 2006   some anxiety recurs when  she has stopped med in past  . OA (osteoarthritis)    B THUMB, RIGHT > LEFT HIP, BACK  . Osteopenia      Family History  Problem Relation Age of Onset  . Cancer Mother        bladder, metastatic  . Alcohol abuse Father   . Heart disease Father        CHF  . Hyperlipidemia Father   . Hypertension Father   . Aortic aneurysm Sister   . Crohn's disease Sister   . Hyperlipidemia Sister   . Cancer Sister        brain cancer in her 26's  . Diabetes Maternal Uncle   . Cancer Maternal Grandmother 98       pancreatic  . Cancer Maternal Grandfather        bladder cancer in 62's  . Stroke Paternal Grandmother   . Heart disease Paternal Grandfather        MI later 76's, early 64's     Past Surgical History:  Procedure Laterality Date  . BREAST ENHANCEMENT SURGERY  1978   implants placed '78, removed and replaced  approx '95 (Dr. Wendy Poet)  . COLONOSCOPY  08/2011  . COSMETIC SURGERY     LIPOSUCTION; other facial cosmetic surgery  . GANGLION CYST EXCISION    . [place] HERNIA REPAIR  2004   right, and ganglion cyst removal  . LIPOMA EXCISION    . MELANOMA EXCISION  2013   R upper arm  . NASAL SEPTUM SURGERY  1987   for deviated septum (Dr. Wendy Poet)  . TONSILLECTOMY AND ADENOIDECTOMY  1951  . VIDEO BRONCHOSCOPY Bilateral 08/08/2015   Procedure: VIDEO BRONCHOSCOPY WITHOUT FLUORO;  Surgeon: Juanito Doom, MD;  Location: [facility] ENDOSCOPY;  Service: Cardiopulmonary;  Laterality: Bilateral;    Social History   Socioeconomic History  . Marital status: Single    Spouse name: Not on file  . Number of children: Not on file  . Years of education: Not on file  . Highest education level: Not on file  Occupational History  . Not on file  Tobacco Use  . Smoking status: Former Smoker    Packs/day: 1.00    Years: 45.00    Pack years: 45.00    Types: Cigarettes    Quit date: 08/05/2014    Years since quitting: 5.2  . Smokeless tobacco: Never Used  Substance and Sexual Activity  .  Alcohol use: Yes    Alcohol/week: 0.0 standard drinks    Comment: 2 drinks per night  . Drug use: No  . Sexual activity: Not Currently    Partners: Female  Other Topics Concern  . Not on file  Social History Narrative   Lives with other members of the family, 5 cat in the home, 2 dog outside   Social Determinants of Health  Financial Resource Strain:   . Difficulty of Paying Living Expenses:   Food Insecurity:   . Worried About Charity fundraiser in the Last Year:   . Arboriculturist in the Last Year:   Transportation Needs:   . Film/video editor (Medical):   [name] Lack of Transportation (Non-Medical):   Physical Activity:   . Days of Exercise per Week:   . Minutes of Exercise per Session:   Stress:   . Feeling of Stress :   Social Connections:   . Frequency of Communication with Friends and Family:   . Frequency of Social Gatherings with Friends and Family:   . Attends Religious Services:   . Active Member of Clubs or Organizations:   . Attends Archivist Meetings:   [name] Marital Status:   Intimate Partner Violence:   . Fear of Current or Ex-Partner:   . Emotionally Abused:   [name] Physically Abused:   . Sexually Abused:      No Known Allergies   Immunization History  Administered Date(s) Administered  . Fluad Quad(high Dose 65+) 02/25/2019  . Influenza Split 07/03/2011, 02/14/2012  . Influenza Whole 05/16/2004  . Influenza, High Dose Seasonal PF 03/18/2013, 03/24/2014, 03/08/2015  . Influenza,inj,Quad PF,6+ Mos 03/21/2016, 05/07/2017  . Influenza-Unspecified 03/27/2018  . PFIZER SARS-COV-2 Vaccination 08/03/2019, 08/24/2019  . PPD Test 05/30/1999  . Pneumococcal Conjugate-13 03/24/2014, 02/12/2019  . Pneumococcal Polysaccharide-23 08/06/2000, 01/30/2011  . Td 09/23/2001  . Tdap 01/30/2011  . Zoster 03/05/2011  . Zoster Recombinat (Shingrix) 12/20/2016, 03/27/2018    Outpatient Medications Prior to Visit  Medication Sig Dispense Refill  . albuterol  (VENTOLIN HFA) 108 (90 Base) MCG/ACT inhaler Inhale 2 puffs into the lungs every 6 (six) hours as needed for wheezing or shortness of breath. 20.1 g 3  . ALPRAZolam (XANAX) 0.5 MG tablet Take 0.5 mg by mouth at bedtime as needed for anxiety.    [name] atorvastatin (LIPITOR) 10 MG tablet Take 1 tablet (10 mg total) by mouth daily. 90 tablet 3  . busPIRone (BUSPAR) 7.5 MG tablet TAKE 1 TABLET BY MOUTH THREE TIMES A DAY 270 tablet 2  . citalopram (CELEXA) 10 MG tablet TAKE 1 TABLET BY MOUTH EVERY DAY 90 tablet 3  . fluticasone (FLONASE) 50 MCG/ACT nasal spray Place 2 sprays into both nostrils daily. 16 g 5  . Fluticasone-Salmeterol (ADVAIR DISKUS) 250-50 MCG/DOSE AEPB INHALE 1 PUFF INTO THE LUNGS EVERY 12 HOURS. 180 each 2  . guaiFENesin (MUCINEX) 600 MG 12 hr tablet Take 1,200 mg by mouth 2 (two) times daily.     [name] levofloxacin (LEVAQUIN) 750 MG tablet Take 1 tablet (750 mg total) by mouth daily for 7 days. 7 tablet 0  . montelukast (SINGULAIR) 10 MG tablet TAKE 1 TABLET BY MOUTH EVERYDAY AT BEDTIME 90 tablet 1  . Multiple Vitamin (MULTI VITAMIN DAILY PO) Take by mouth.    . OXYGEN Inhale 2 L into the lungs.    . sodium chloride HYPERTONIC 3 % nebulizer solution Take by nebulization 2 (two) times daily as needed for other. 360 mL 11  . umeclidinium bromide (INCRUSE ELLIPTA) 62.5 MCG/INH AEPB Inhale 1 puff into the lungs daily. 90 each 3  . albuterol (PROVENTIL) (2.5 MG/3ML) 0.083% nebulizer solution USE 1 VIAL VIA NEBULIZER EVERY 2 HOURS AS NEEDED FOR SHORTNESS OF BREATH (Patient not taking: Reported on 10/18/2019) 375 mL 5  . Calcium Carbonate-Vitamin D (CALCIUM-VITAMIN D) 500-200 MG-UNIT tablet Take 1 tablet by mouth daily.  No facility-administered medications prior to visit.    Review of Systems  Constitutional: Negative for chills, fever and weight loss.  HENT: Negative.   Eyes: Negative for blurred vision and double vision.  Respiratory: Positive for cough, sputum production and shortness of  breath. Negative for hemoptysis.   Cardiovascular: Negative for chest pain and leg swelling.  Gastrointestinal: Negative for diarrhea, nausea and vomiting.  Genitourinary: Negative.   Musculoskeletal: Negative for joint pain and myalgias.  Skin: Negative for rash.  Neurological: Negative.   Endo/Heme/Allergies: Negative.      Objective:   Vitals:   10/18/19 1422  BP: (!) 142/64  Pulse: 94  Temp: (!) 97.4 F (36.3 C)  TempSrc: Temporal  SpO2: 99%  Weight: 154 lb 6.4 oz (70 kg)  Height: 5' 7.5" (1.715 m)   99% on 2 LPM  BMI Readings from Last 3 Encounters:  10/18/19 23.83 kg/m  10/12/19 24.09 kg/m  03/18/19 23.39 kg/m   Wt Readings from Last 3 Encounters:  10/18/19 154 lb 6.4 oz (70 kg)  10/12/19 153 lb 12.8 oz (69.8 kg)  03/18/19 148 lb 3.2 oz (67.2 kg)    Physical Exam Vitals reviewed.  Constitutional:      Comments: Chronically ill-appearing, sitting in wheelchair  HENT:     Head: Normocephalic and atraumatic.  Eyes:     General: No scleral icterus. Cardiovascular:     Rate and Rhythm: Normal rate and regular rhythm.  Pulmonary:     Comments: CTAB, pursed- lip breathing, on 2L [place]. Mild tachypnea. Abdominal:     General: There is no distension.     Palpations: Abdomen is soft.     Tenderness: There is no abdominal tenderness.  Musculoskeletal:        General: No swelling or deformity.     Cervical back: Neck supple.  Lymphadenopathy:     Cervical: No cervical adenopathy.  Skin:    General: Skin is warm and dry.     Findings: No rash.     Comments: Venous stasis dermatitis  Neurological:     General: No focal deficit present.     Mental Status: She is alert.     Coordination: Coordination normal.  Psychiatric:        Mood and Affect: Mood normal.        Behavior: Behavior normal.      CBC    Component Value Date/Time   WBC 6.5 02/25/2019 1521   WBC 9.3 09/03/2018 0440   RBC 4.30 02/25/2019 1521   RBC 3.46 (L) 09/03/2018 0440   HGB 13.1  02/25/2019 1521   HCT 39.5 02/25/2019 1521   PLT 206 02/25/2019 1521   MCV 92 02/25/2019 1521   MCH 30.5 02/25/2019 1521   MCH 30.3 09/03/2018 0440   MCHC 33.2 02/25/2019 1521   MCHC 29.7 (L) 09/03/2018 0440   RDW 13.2 02/25/2019 1521   LYMPHSABS 1.3 02/25/2019 1521   MONOABS 0.6 09/02/2018 0508   EOSABS 0.0 02/25/2019 1521   BASOSABS 0.0 02/25/2019 1521     Chest Imaging- films reviewed: CXR 08/14/2018- Increased basilar lung markings. Hyperinflated with flattened hemidiaphragms.  CXR 10/12/2019-mild right upper lobe opacity in the apex and lower right upper lobe.  Chronic hyperinflation.  Pulmonary Functions Testing Results: PFT Results Latest Ref Rng & Units 10/07/2014  FVC-Pre L 2.36  FVC-Predicted Pre % 67  FVC-Post L 2.51  FVC-Predicted Post % 72  Pre FEV1/FVC % % 33  Post FEV1/FCV % % 33  FEV1-Pre L  0.77  FEV1-Predicted Pre % 29  FEV1-Post L 0.82  DLCO UNC% % 25  DLCO COR %Predicted % 26  TLC L 7.07  TLC % Predicted % 125  RV % Predicted % 180  Very severe obstruction with severe diffusion impairment.  Pathology:  BAL left lower lobe-no malignancy  Micro: Fungus culture 11/28/2015-Growth of Penicillium species, Growth of An arthrospore-producing mold, not Coccidioides immitis  08/08/2015 BAL- Aspergillus ochraceus  08/08/2015 BAL AFB- negative 08/08/2015 BAL- normal flora 06/06/2016 sputum- normal flora    Echocardiogram 08/08/2014: LV ejection fraction 60 to [id number], grade 1 diastolic dysfunction, PA peak pressure 38     Assessment & Plan:     ICD-10-CM   1. Chronic respiratory failure with hypoxia (HCC)  J96.11 Pulse oximetry, overnight    COPD- GOLD 4D.  Recent community-acquired pneumonia last week. -Up-to-date on seasonal flu, Covid, and pneumococcal vaccinations -Continue Advair and Incruse as prescribed.  Rinse her mouth after using Advair.  If she has no prior episodes of pneumonia, will have to investigate if stopping ICS is necessary.  Her symptoms are  not back to baseline yet and would prefer to defer de-escalation of her reigmen if possible. -Continue Mucinex.  Continue hypertonic saline nebs BID with aerobika flutter valve to use in-line with nebulizers twice daily for airway clearance.  Can add albuterol nebs to help with airway clearance.   -Continue albuterol as needed -Previously have discussed pulmonary rehab; can revisit in the future.  At this point I do not think she has enough endurance to be able to get to and complete pulmonary rehab. -Walk in the office today to requalify for supplemental oxygen-2 L at rest and with exercise.  Very limited functional capacity.  Chronic hypoxic respiratory failure due to COPD -Requalified for oxygen during her visit. -Overnight oximetry on 2 L of supplemental oxygen.  Okay to keep using 4L [place]  until this test.  Allergic rhinosinusitis -Continue Singulair -Can restart antihistamine and Flonase as needed - Recommended against the use of Afrin due to the risk of tachyphylaxis with rebound congestion  Not discussed today: Pulmonary nodules -I had a discussion with her regarding the risks and benefits of ongoing surveillance for lung cancer.  She feels very strongly that she would be high risk for diagnostic interventions and would be unlikely to be a candidate for treatment, and she does not wish to pursue additional surveillance imaging.  She understands the risk that she could develop advanced lung cancer.  She is correct that she would be a high risk procedural candidate.  RTC in 2 months.    Current Outpatient Medications:  .  albuterol (VENTOLIN HFA) 108 (90 Base) MCG/ACT inhaler, Inhale 2 puffs into the lungs every 6 (six) hours as needed for wheezing or shortness of breath., Disp: 20.1 g, Rfl: 3 .  ALPRAZolam (XANAX) 0.5 MG tablet, Take 0.5 mg by mouth at bedtime as needed for anxiety., Disp: , Rfl:  .  atorvastatin (LIPITOR) 10 MG tablet, Take 1 tablet (10 mg total) by mouth daily.,  Disp: 90 tablet, Rfl: 3 .  busPIRone (BUSPAR) 7.5 MG tablet, TAKE 1 TABLET BY MOUTH THREE TIMES A DAY, Disp: 270 tablet, Rfl: 2 .  citalopram (CELEXA) 10 MG tablet, TAKE 1 TABLET BY MOUTH EVERY DAY, Disp: 90 tablet, Rfl: 3 .  fluticasone (FLONASE) 50 MCG/ACT nasal spray, Place 2 sprays into both nostrils daily., Disp: 16 g, Rfl: 5 .  Fluticasone-Salmeterol (ADVAIR DISKUS) 250-50 MCG/DOSE AEPB, INHALE 1 PUFF INTO THE  LUNGS EVERY 12 HOURS., Disp: 180 each, Rfl: 2 .  guaiFENesin (MUCINEX) 600 MG 12 hr tablet, Take 1,200 mg by mouth 2 (two) times daily. , Disp: , Rfl:  .  levofloxacin (LEVAQUIN) 750 MG tablet, Take 1 tablet (750 mg total) by mouth daily for 7 days., Disp: 7 tablet, Rfl: 0 .  montelukast (SINGULAIR) 10 MG tablet, TAKE 1 TABLET BY MOUTH EVERYDAY AT BEDTIME, Disp: 90 tablet, Rfl: 1 .  Multiple Vitamin (MULTI VITAMIN DAILY PO), Take by mouth., Disp: , Rfl:  .  OXYGEN, Inhale 2 L into the lungs., Disp: , Rfl:  .  sodium chloride HYPERTONIC 3 % nebulizer solution, Take by nebulization 2 (two) times daily as needed for other., Disp: 360 mL, Rfl: 11 .  umeclidinium bromide (INCRUSE ELLIPTA) 62.5 MCG/INH AEPB, Inhale 1 puff into the lungs daily., Disp: 90 each, Rfl: 3 .  albuterol (PROVENTIL) (2.5 MG/3ML) 0.083% nebulizer solution, USE 1 VIAL VIA NEBULIZER EVERY 2 HOURS AS NEEDED FOR SHORTNESS OF BREATH (Patient not taking: Reported on 10/18/2019), Disp: 375 mL, Rfl: 5 .  Calcium Carbonate-Vitamin D (CALCIUM-VITAMIN D) 500-200 MG-UNIT tablet, Take 1 tablet by mouth daily., Disp: , Rfl:    Julian Hy, DO [facility] 10/18/2019 2:40 PM

## 2019-10-20 ENCOUNTER — Telehealth: Payer: Self-pay | Admitting: Critical Care Medicine

## 2019-10-20 NOTE — Telephone Encounter (Signed)
Error

## 2019-10-21 ENCOUNTER — Telehealth: Payer: Self-pay | Admitting: Critical Care Medicine

## 2019-10-21 DIAGNOSIS — J449 Chronic obstructive pulmonary disease, unspecified: Secondary | ICD-10-CM | POA: Diagnosis not present

## 2019-10-21 DIAGNOSIS — R0902 Hypoxemia: Secondary | ICD-10-CM | POA: Diagnosis not present

## 2019-10-21 NOTE — Telephone Encounter (Signed)
Called and spoke with patient.  Let her know her results per [facility] Patient voiced understanding.  Nothing further needed at this time.

## 2019-10-21 NOTE — Telephone Encounter (Signed)
Called and spoke with Melissa from [employer], she was going to look into why they were calling. Patient has recent order for ONO on 2L oxygen and that is what PC put in her notes  Lenna Sciara will call back with update

## 2019-10-28 NOTE — Telephone Encounter (Signed)
I have sent a community message to Clemmie Krill and Melissa with [facility] to follow up on this.

## 2019-10-28 NOTE — Telephone Encounter (Signed)
Pt following up making sure everything is ok regarding oxygen and hasn't heard about having overnight oxygen study.  Please advise.  [phone].

## 2019-10-29 ENCOUNTER — Telehealth: Payer: Self-pay | Admitting: Critical Care Medicine

## 2019-10-29 NOTE — Telephone Encounter (Signed)
Spoke to Courtney Ayers at [facility] they show that they have talked to the patient and have scheduled her I have also called the patient and left message to be sure they have contacted her

## 2019-10-29 NOTE — Telephone Encounter (Signed)
Patient results in Dr. Ainsley Spinner folder. Made pt aware that we would call her when the results are reviewed. Please advise.   She stated that the test was done with 2L of oxygen on during the test. I advised her that Dr. Carlis Abbott wanted her on her oxygen according to the order in her chart.   [facility] needs to know if she needs oxygen during the day and according to the last OV, she did qualify for oxygen. When Dr. Carlis Abbott reviews ONO, we can send an order to [facility] letting them know she needs supplemental oxygen and oxygen at night.

## 2019-11-01 NOTE — Telephone Encounter (Signed)
Pt states [facility] needs 5/10 reading for overnight oxygen study.  Need documentation and haven't received it.  [phone] -[facility].  Pt's number is [phone].  Pt requesting call back to confirm w/her.  Please see previous messages.

## 2019-11-01 NOTE — Telephone Encounter (Signed)
I placed ONO result on Aaron Edelman desk to review for Dr. Carlis Abbott.

## 2019-11-01 NOTE — Telephone Encounter (Signed)
I spoke with the Courtney Ayers and notified of results Will forward to Dr Carlis Abbott as Juluis Rainier

## 2019-11-01 NOTE — Telephone Encounter (Signed)
Routing this encounter to Aaron Edelman so he can document in.

## 2019-11-01 NOTE — Telephone Encounter (Signed)
11/01/2019  I have reviewed patient's overnight oximetry results are listed below:  10/21/2019-overnight oximetry on 2 L-duration of sleep 5 hours and 16 minutes, time spent below 88% 44 seconds.  Patient does not require additional oxygen.  Patient needs to remain on 2 L continuous when sleeping.  We will route results back to triage as well as Dr. Carlis Abbott.  Wyn Quaker FNP

## 2019-11-01 NOTE — Telephone Encounter (Signed)
Sorry I can't see the ONO results. If this is urgent can someone who's in clinic review it and prescribe oxygen? I won't be back in the office until next week.  LPC

## 2019-11-03 NOTE — Telephone Encounter (Signed)
Agree with Aaron Edelman, thanks Magda Paganini!  LPC

## 2019-11-09 ENCOUNTER — Telehealth: Payer: Self-pay | Admitting: Critical Care Medicine

## 2019-11-09 ENCOUNTER — Other Ambulatory Visit: Payer: Self-pay | Admitting: Critical Care Medicine

## 2019-11-09 DIAGNOSIS — J209 Acute bronchitis, unspecified: Secondary | ICD-10-CM

## 2019-11-09 NOTE — Telephone Encounter (Signed)
Patient contacted by phone to clarify need. [facility] contacted and order placed for oxygen to be continued, qualifying walk completed on 10/18/2019

## 2019-11-09 NOTE — Addendum Note (Signed)
Addended by: Tery Sanfilippo R on: 11/09/2019 02:02 PM   Modules accepted: Orders

## 2019-11-09 NOTE — Progress Notes (Signed)
dme

## 2019-11-11 ENCOUNTER — Telehealth: Payer: Self-pay | Admitting: Critical Care Medicine

## 2019-11-11 NOTE — Telephone Encounter (Signed)
I will re-fax. I called Barnabas Lister and asked her to provide her fax number [phone]. I advised her that I faxed the documents. Nothing further is needed.

## 2019-12-07 ENCOUNTER — Other Ambulatory Visit: Payer: Self-pay | Admitting: Critical Care Medicine

## 2019-12-17 ENCOUNTER — Telehealth: Payer: Self-pay | Admitting: Critical Care Medicine

## 2019-12-17 NOTE — Telephone Encounter (Signed)
I don't see where we have recently sent anything to [facility] so I don't know what she is referring to.  I called Leah & left her a vm telling her what pt has said and asked her to call me back.

## 2019-12-21 ENCOUNTER — Other Ambulatory Visit: Payer: Self-pay

## 2019-12-21 ENCOUNTER — Encounter: Payer: Self-pay | Admitting: Critical Care Medicine

## 2019-12-21 ENCOUNTER — Ambulatory Visit (INDEPENDENT_AMBULATORY_CARE_PROVIDER_SITE_OTHER): Payer: Medicare Other | Admitting: Critical Care Medicine

## 2019-12-21 VITALS — BP 122/66 | HR 97 | Temp 98.0°F | Ht 67.0 in | Wt 154.4 lb

## 2019-12-21 DIAGNOSIS — J449 Chronic obstructive pulmonary disease, unspecified: Secondary | ICD-10-CM | POA: Diagnosis not present

## 2019-12-21 DIAGNOSIS — J309 Allergic rhinitis, unspecified: Secondary | ICD-10-CM | POA: Diagnosis not present

## 2019-12-21 DIAGNOSIS — J9611 Chronic respiratory failure with hypoxia: Secondary | ICD-10-CM | POA: Diagnosis not present

## 2019-12-21 NOTE — Telephone Encounter (Signed)
Denny Peon has not called me back.  I called Melissa.  She is going to check on this and call me back.

## 2019-12-21 NOTE — Patient Instructions (Addendum)
Thank you for visiting Dr. Carlis Abbott at [facility]. We recommend the following:  Keep using all inhalers. Keep avoiding tobacco.   Return in about 3 months (around 03/22/2020).    Please do your part to reduce the spread of COVID-19.

## 2019-12-21 NOTE — Telephone Encounter (Signed)
Any update on this?

## 2019-12-21 NOTE — Progress Notes (Signed)
Synopsis: Referred in March 2018 for COPD by [name], MD. Previously a patient of Dr. Lake Bells.  Subjective:   PATIENT ID: Courtney Ayers GENDER: female DOB: 1946/06/04, MRN: [id number]  Chief Complaint  Patient presents with   Follow-up    Patient feels better since last visit but feels like she is not getting enough air but has deviated septum. Patient is on 2 liters oxygen all the time.     [name] is a 74 y/o woman with severe COPD who presents for follow up of COPD. She is accompanied by her sister. She continiues on 2L O2 but reports that her insurance company has been trying to deny her claims for her home oxygen since her last order was placed when she had an acute exacerbation. She has significantly improved since her last visit after completing her steroid and antibiotics. She continues on daily Incruse and BID Advair. She has gained about 10# recently and has a good appetite. She is able to care for herself at home and does chores around her house with breaks and pacing herself. Chemical smells worsen her breathing, but she has someone help with those tasks.      OV 10/18/19: [name] is a 74 y/o woman with a history of severe COPD and chronic hypoxic respiratory failure presents for follow-up.  She is accompanied today by her sister.  She has completed her levofloxacin this morning.  She still feels fatigued and has been sleeping a lot.  She remains short of breath and not back to her previous baseline.  She is now wheezing.  Her cough is improved but not back to baseline.  She continues to have sputum, but is having less and it is getting harder to clear.  No fevers.  Her right-sided chest pain is improved.  She has been thirsty and drinking a lot of water but had a poor appetite.  She has been urinating frequently.  She continues to use her flutter valve and spirometer, and using respirometer as needed to help with her symptoms.  She is using her albuterol 2-3 times  per day in addition to present Advair.   OV 10/12/19: [name] presents for an acute visit to evaluate 2 days of worsening shortness of breath, increased sputum production, discolored sputum, right-sided chest and back pain with coughing, headache, and lower saturations when she wakes up in the morning on her 2L home oxygen.  No wheezing.  She remains on Advair twice daily and increase daily.  She has been using her nebulizer more frequently to help clear sputum.  She continues to use hypertonic saline twice daily.  She has not had a fever but had a temperature of 99.8 this morning at home.  She has continued using her home 2 L of oxygen, but her daughter said her sats were in the high 43s and low 80s when she woke up this morning, which is unusual for her.  When she has taken prednisone in the past she has not tolerated it well due to vomiting.  No antibiotic allergies.   OV 03/18/2019: [name] is a 74 y/o woman being seen to follow up for COPD and chronic hypoxic respiratory failure.  She has previously a patient of Dr. Anastasia Pall since 2016.  Her most recent hospitalization was in March 2020.  She quit smoking in 2016 after 45 pack years.  She is on triple inhaled therapy with Advair and Incruse. She uses her rescue inhaler 1-2 times daily  most days. She continues to have significant thick sputum, and she has been taking mucinex regularly but not using hypertonic saline nebs. She infrequently uses her flutter valve. Instead she has been using a "breather" device she was given at rehab to strengthen her respiratory muscles, which she uses diligently.  Since discharge from rehab about 3 weeks after her hospitalization this year, hse has been able to get back to her IADLs and ADLs, although she rests frequently due to dyspnea. At home when she checks her saturations on her home 2L, her saturations are in the upper 90s. Although it has been discussed, she has never done pulmonary rehab in the past.  She  has been on 2 L of home oxygen since her hospitalization in February 2016.  In 2017 showed overnight oximetry demonstrating desaturation to 84%.  She wants to purchase a portable oxygen concentrator, and she needs to try walking on this today to verify that her oxygen concentration is stable.    She has chronic allergic rhinosinusitis for which she takes Singulair, but she has not recently been using Flonase or an antihistamine. She occasionally uses afrin nasal spray.  She has received her seasonal flu shot this year.   Past Medical History:  Diagnosis Date   Adenomatous colon polyp    Allergic rhinitis, cause unspecified    seasonal   COPD (chronic obstructive pulmonary disease) (HCC)    no home O2   Depressive disorder, not elsewhere classified 2006   some anxiety recurs when she has stopped med in past   OA (osteoarthritis)    B THUMB, RIGHT > LEFT HIP, BACK   Osteopenia      Family History  Problem Relation Age of Onset   Cancer Mother        bladder, metastatic   Alcohol abuse Father    Heart disease Father        CHF   Hyperlipidemia Father    Hypertension Father    Aortic aneurysm Sister    Crohn's disease Sister    Hyperlipidemia Sister    Cancer Sister        brain cancer in her 65's   Diabetes Maternal Uncle    Cancer Maternal Grandmother 98       pancreatic   Cancer Maternal Grandfather        bladder cancer in 59's   Stroke Paternal Grandmother    Heart disease Paternal Grandfather        MI later 99's, early 49's     Past Surgical History:  Procedure Laterality Date   BREAST ENHANCEMENT SURGERY  1978   implants placed '78, removed and replaced  approx '95 (Dr. Wendy Poet)   COLONOSCOPY  08/2011   COSMETIC SURGERY     LIPOSUCTION; other facial cosmetic surgery   GANGLION CYST EXCISION     INGUINIAL HERNIA REPAIR  2004   right, and ganglion cyst removal   LIPOMA EXCISION     MELANOMA EXCISION  2013   R upper arm   NASAL  SEPTUM SURGERY  1987   for deviated septum (Dr. Wendy Poet)   [place] Bilateral 08/08/2015   Procedure: VIDEO BRONCHOSCOPY WITHOUT FLUORO;  Surgeon: Juanito Doom, MD;  Location: [facility];  Service: Cardiopulmonary;  Laterality: Bilateral;    Social History   Socioeconomic History   Marital status: Single    Spouse name: Not on file   Number of children: Not on file   Years  of education: Not on file   Highest education level: Not on file  Occupational History   Not on file  Tobacco Use   Smoking status: Former Smoker    Packs/day: 1.00    Years: 45.00    Pack years: 45.00    Types: Cigarettes    Quit date: 08/05/2014    Years since quitting: 5.3   Smokeless tobacco: Never Used  Vaping Use   Vaping Use: Never used  Substance and Sexual Activity   Alcohol use: Yes    Alcohol/week: 0.0 standard drinks    Comment: 2 drinks per night   Drug use: No   Sexual activity: Not Currently    Partners: Female  Other Topics Concern   Not on file  Social History Narrative   Lives with other members of the family, 5 cat in the home, 2 dog outside   Social Determinants of Health   Financial Resource Strain:    Difficulty of Paying Living Expenses:   Food Insecurity:    Worried About Charity fundraiser in the Last Year:    Arboriculturist in the Last Year:   Transportation Needs:    Film/video editor (Medical):    Lack of Transportation (Non-Medical):   Physical Activity:    Days of Exercise per Week:    Minutes of Exercise per Session:   Stress:    Feeling of Stress :   Social Connections:    Frequency of Communication with Friends and Family:    Frequency of Social Gatherings with Friends and Family:    Attends Religious Services:    Active Member of Clubs or Organizations:    Attends Archivist Meetings:    Marital Status:   Intimate Partner Violence:    Fear of Current  or Ex-Partner:    Emotionally Abused:    Physically Abused:    Sexually Abused:      No Known Allergies   Immunization History  Administered Date(s) Administered   Fluad Quad(high Dose 65+) 02/25/2019   Influenza Split 07/03/2011, 02/14/2012   Influenza Whole 05/16/2004   Influenza, High Dose Seasonal PF 03/18/2013, 03/24/2014, 03/08/2015, 03/27/2018   Influenza,inj,Quad PF,6+ Mos 03/21/2016, 05/07/2017   Influenza-Unspecified 03/27/2018   PFIZER SARS-COV-2 Vaccination 08/03/2019, 08/24/2019   PPD Test 05/30/1999   Pneumococcal Conjugate-13 03/24/2014, 02/12/2019   Pneumococcal Polysaccharide-23 08/06/2000, 01/30/2011   Td 09/23/2001   Tdap 01/30/2011   Zoster 03/05/2011   Zoster Recombinat (Shingrix) 12/20/2016, 03/27/2018    Outpatient Medications Prior to Visit  Medication Sig Dispense Refill   albuterol (PROVENTIL) (2.5 MG/3ML) 0.083% nebulizer solution USE 1 VIAL VIA NEBULIZER EVERY 2 HOURS AS NEEDED FOR SHORTNESS OF BREATH 375 mL 5   albuterol (VENTOLIN HFA) 108 (90 Base) MCG/ACT inhaler Inhale 2 puffs into the lungs every 6 (six) hours as needed for wheezing or shortness of breath. 20.1 g 3   ALPRAZolam (XANAX) 0.5 MG tablet Take 0.5 mg by mouth at bedtime as needed for anxiety.     atorvastatin (LIPITOR) 10 MG tablet Take 1 tablet (10 mg total) by mouth daily. 90 tablet 3   busPIRone (BUSPAR) 7.5 MG tablet TAKE 1 TABLET BY MOUTH THREE TIMES A DAY 270 tablet 2   Calcium Carbonate-Vitamin D (CALCIUM-VITAMIN D) 500-200 MG-UNIT tablet Take 1 tablet by mouth daily.     citalopram (CELEXA) 10 MG tablet TAKE 1 TABLET BY MOUTH EVERY DAY 90 tablet 3   fluticasone (FLONASE) 50 MCG/ACT nasal spray Place 2 sprays  into both nostrils daily. 16 g 5   Fluticasone-Salmeterol (ADVAIR DISKUS) 250-50 MCG/DOSE AEPB INHALE 1 PUFF INTO THE LUNGS EVERY 12 HOURS. 180 each 2   guaiFENesin (MUCINEX) 600 MG 12 hr tablet Take 1,200 mg by mouth 2 (two) times daily.       montelukast (SINGULAIR) 10 MG tablet TAKE 1 TABLET BY MOUTH EVERYDAY AT BEDTIME 90 tablet 1   Multiple Vitamin (MULTI VITAMIN DAILY PO) Take by mouth.     OXYGEN Inhale 2 L into the lungs.     sodium chloride HYPERTONIC 3 % nebulizer solution Take by nebulization 2 (two) times daily as needed for other. 360 mL 11   umeclidinium bromide (INCRUSE ELLIPTA) 62.5 MCG/INH AEPB Inhale 1 puff into the lungs daily. 90 each 3   No facility-administered medications prior to visit.    Review of Systems  Constitutional: Negative for chills, fever and weight loss.  HENT: Negative.   Eyes: Negative for blurred vision and double vision.  Respiratory: Positive for cough, sputum production and shortness of breath. Negative for hemoptysis.   Cardiovascular: Negative for chest pain and leg swelling.  Gastrointestinal: Negative for diarrhea, nausea and vomiting.  Genitourinary: Negative.   Musculoskeletal: Negative for joint pain and myalgias.  Skin: Negative for rash.  Neurological: Negative.   Endo/Heme/Allergies: Negative.      Objective:   Vitals:   12/21/19 1411  BP: 122/66  Pulse: 97  Temp: 98 F (36.7 C)  TempSrc: Oral  SpO2: 99%  Weight: 154 lb 6.4 oz (70 kg)  Height: 5\' 7"  (1.702 m)   99% on 2 LPM  BMI Readings from Last 3 Encounters:  12/21/19 24.18 kg/m  10/18/19 23.83 kg/m  10/12/19 24.09 kg/m   Wt Readings from Last 3 Encounters:  12/21/19 154 lb 6.4 oz (70 kg)  10/18/19 154 lb 6.4 oz (70 kg)  10/12/19 153 lb 12.8 oz (69.8 kg)    Physical Exam Vitals reviewed.  Constitutional:      General: She is not in acute distress.    Appearance: She is not ill-appearing.     Comments: Chronically ill appearing elderly woman sitting in a WC. Improved since last 2 visits.  HENT:     Head: Normocephalic and atraumatic.  Eyes:     General: No scleral icterus. Cardiovascular:     Rate and Rhythm: Normal rate and regular rhythm.     Heart sounds: No murmur heard.    Pulmonary:     Comments: Breathing comfortably on [place] but more tachypneic and labored when oxygen turned off for requalification for O2. Abdominal:     General: There is no distension.     Palpations: Abdomen is soft.     Tenderness: There is no abdominal tenderness.  Musculoskeletal:        General: No deformity.     Cervical back: Neck supple.  Lymphadenopathy:     Cervical: No cervical adenopathy.  Skin:    General: Skin is warm and dry.     Findings: No rash.     Comments: Feet with mild erythema- chronic  Neurological:     General: No focal deficit present.     Mental Status: She is alert.     Coordination: Coordination normal.     Comments: Globally weak.  Psychiatric:        Mood and Affect: Mood normal.        Behavior: Behavior normal.      CBC    Component Value Date/Time  WBC 6.5 02/25/2019 1521   WBC 9.3 09/03/2018 0440   RBC 4.30 02/25/2019 1521   RBC 3.46 (L) 09/03/2018 0440   HGB 13.1 02/25/2019 1521   HCT 39.5 02/25/2019 1521   PLT 206 02/25/2019 1521   MCV 92 02/25/2019 1521   MCH 30.5 02/25/2019 1521   MCH 30.3 09/03/2018 0440   MCHC 33.2 02/25/2019 1521   MCHC 29.7 (L) 09/03/2018 0440   RDW 13.2 02/25/2019 1521   LYMPHSABS 1.3 02/25/2019 1521   MONOABS 0.6 09/02/2018 0508   EOSABS 0.0 02/25/2019 1521   BASOSABS 0.0 02/25/2019 1521     Chest Imaging- films reviewed: CXR 08/14/2018- Increased basilar lung markings. Hyperinflated with flattened hemidiaphragms.  CXR 10/12/2019-mild right upper lobe opacity in the apex and lower right upper lobe.  Chronic hyperinflation.  Pulmonary Functions Testing Results: PFT Results Latest Ref Rng & Units 10/07/2014  FVC-Pre L 2.36  FVC-Predicted Pre % 67  FVC-Post L 2.51  FVC-Predicted Post % 72  Pre FEV1/FVC % % 33  Post FEV1/FCV % % 33  FEV1-Pre L 0.77  FEV1-Predicted Pre % 29  FEV1-Post L 0.82  DLCO UNC% % 25  DLCO COR %Predicted % 26  TLC L 7.07  TLC % Predicted % 125  RV % Predicted % 180   Very severe obstruction with severe diffusion impairment.   Overnight oximetry 10/21/2019- performed on 2 L oxygen; 44 sec spent <88%. Ok to continue on 2L O2.   Pathology:  BAL left lower lobe-no malignancy  Micro: Fungus culture 11/28/2015-Growth of Penicillium species, Growth of An arthrospore-producing mold, not Coccidioides immitis  08/08/2015 BAL- Aspergillus ochraceus  08/08/2015 BAL AFB- negative 08/08/2015 BAL- normal flora 06/06/2016 sputum- normal flora    Echocardiogram 08/08/2014: LV ejection fraction 60 to 59%, grade 1 diastolic dysfunction, PA peak pressure 38     Assessment & Plan:   No diagnosis found.  COPD- GOLD 4D.  Recent community-acquired pneumonia in spring 2021 treated as OP. -Up-to-date on seasonal flu, Covid, and pneumococcal vaccinations -Continue Advair BID and Incruse daily.  Rinse after using Advair.  If she has additional episodes of pneumonia, need to consider if dropping ICS would be beneficial. -Continue Mucinex PRN.  Continue hypertonic saline nebs BID with aerobika flutter valve to use in-line with nebulizers twice daily for airway clearance.  Can add albuterol nebs to help with airway clearance.   -Continue albuterol as needed -Walked in the office today to requalify for supplemental oxygen today-2 L with exercise and sleep prescribed.    Chronic hypoxic respiratory failure due to COPD -Requalified for oxygen during her visit today. -Continue 2 L of supplemental oxygen at night per ONO results.   Allergic rhinosinusitis -Continue Singulair -Can use antihistamine and Flonase as needed  Not discussed today: Pulmonary nodules -I had a discussion with her regarding the risks and benefits of ongoing surveillance for lung cancer.  She feels very strongly that she would be high risk for diagnostic interventions and would be unlikely to be a candidate for treatment, and she does not wish to pursue additional surveillance imaging.  She understands the  risk that she could develop advanced lung cancer.  She is correct that she would be a high risk procedural candidate.  RTC in 3 months.    Current Outpatient Medications:    albuterol (PROVENTIL) (2.5 MG/3ML) 0.083% nebulizer solution, USE 1 VIAL VIA NEBULIZER EVERY 2 HOURS AS NEEDED FOR SHORTNESS OF BREATH, Disp: 375 mL, Rfl: 5   albuterol (VENTOLIN HFA) 108 (  90 Base) MCG/ACT inhaler, Inhale 2 puffs into the lungs every 6 (six) hours as needed for wheezing or shortness of breath., Disp: 20.1 g, Rfl: 3   ALPRAZolam (XANAX) 0.5 MG tablet, Take 0.5 mg by mouth at bedtime as needed for anxiety., Disp: , Rfl:    atorvastatin (LIPITOR) 10 MG tablet, Take 1 tablet (10 mg total) by mouth daily., Disp: 90 tablet, Rfl: 3   busPIRone (BUSPAR) 7.5 MG tablet, TAKE 1 TABLET BY MOUTH THREE TIMES A DAY, Disp: 270 tablet, Rfl: 2   Calcium Carbonate-Vitamin D (CALCIUM-VITAMIN D) 500-200 MG-UNIT tablet, Take 1 tablet by mouth daily., Disp: , Rfl:    citalopram (CELEXA) 10 MG tablet, TAKE 1 TABLET BY MOUTH EVERY DAY, Disp: 90 tablet, Rfl: 3   fluticasone (FLONASE) 50 MCG/ACT nasal spray, Place 2 sprays into both nostrils daily., Disp: 16 g, Rfl: 5   Fluticasone-Salmeterol (ADVAIR DISKUS) 250-50 MCG/DOSE AEPB, INHALE 1 PUFF INTO THE LUNGS EVERY 12 HOURS., Disp: 180 each, Rfl: 2   guaiFENesin (MUCINEX) 600 MG 12 hr tablet, Take 1,200 mg by mouth 2 (two) times daily. , Disp: , Rfl:    montelukast (SINGULAIR) 10 MG tablet, TAKE 1 TABLET BY MOUTH EVERYDAY AT BEDTIME, Disp: 90 tablet, Rfl: 1   Multiple Vitamin (MULTI VITAMIN DAILY PO), Take by mouth., Disp: , Rfl:    OXYGEN, Inhale 2 L into the lungs., Disp: , Rfl:    sodium chloride HYPERTONIC 3 % nebulizer solution, Take by nebulization 2 (two) times daily as needed for other., Disp: 360 mL, Rfl: 11   umeclidinium bromide (INCRUSE ELLIPTA) 62.5 MCG/INH AEPB, Inhale 1 puff into the lungs daily., Disp: 90 each, Rfl: 3   Julian Hy, DO [place]  [facility] 12/21/2019 2:15 PM

## 2019-12-22 NOTE — Telephone Encounter (Signed)
Courtney Ayers called me back.  She sent a second email yesterday to get status of this and states she sent a 3rd email today to management and hasn't received a response yet.

## 2019-12-22 NOTE — Telephone Encounter (Signed)
Left vm for Melissa to check status. 

## 2019-12-23 NOTE — Telephone Encounter (Signed)
Got a message from [place] at [facility].  She found out they do have all they need for pt's O2 except for cmn and they will be sending that to [place] for signature.  Nothing else needed from [place].  I called pt & made her aware all that is needed is a form they will be sending to [place] for signature.  Message closed.

## 2020-01-11 ENCOUNTER — Telehealth: Payer: Self-pay | Admitting: Critical Care Medicine

## 2020-01-11 NOTE — Telephone Encounter (Signed)
I called and spoke to [facility] she is going to call the office about this since they can view the records

## 2020-01-11 NOTE — Telephone Encounter (Signed)
Message to [facility], please advise if triage can assist.

## 2020-02-21 DIAGNOSIS — Z23 Encounter for immunization: Secondary | ICD-10-CM | POA: Diagnosis not present

## 2020-02-24 ENCOUNTER — Other Ambulatory Visit: Payer: Self-pay | Admitting: Family Medicine

## 2020-02-24 DIAGNOSIS — I700 Atherosclerosis of aorta: Secondary | ICD-10-CM

## 2020-02-28 ENCOUNTER — Other Ambulatory Visit: Payer: Self-pay

## 2020-02-28 ENCOUNTER — Ambulatory Visit (INDEPENDENT_AMBULATORY_CARE_PROVIDER_SITE_OTHER): Payer: Medicare Other | Admitting: Family Medicine

## 2020-02-28 ENCOUNTER — Encounter: Payer: Self-pay | Admitting: Family Medicine

## 2020-02-28 VITALS — BP 104/68 | HR 92 | Temp 98.1°F | Ht 67.0 in | Wt 154.2 lb

## 2020-02-28 DIAGNOSIS — J449 Chronic obstructive pulmonary disease, unspecified: Secondary | ICD-10-CM

## 2020-02-28 DIAGNOSIS — J301 Allergic rhinitis due to pollen: Secondary | ICD-10-CM | POA: Diagnosis not present

## 2020-02-28 DIAGNOSIS — Z860100 Personal history of colon polyps, unspecified: Secondary | ICD-10-CM

## 2020-02-28 DIAGNOSIS — J441 Chronic obstructive pulmonary disease with (acute) exacerbation: Secondary | ICD-10-CM | POA: Diagnosis not present

## 2020-02-28 DIAGNOSIS — Z23 Encounter for immunization: Secondary | ICD-10-CM | POA: Diagnosis not present

## 2020-02-28 DIAGNOSIS — J9611 Chronic respiratory failure with hypoxia: Secondary | ICD-10-CM | POA: Diagnosis not present

## 2020-02-28 DIAGNOSIS — Z0000 Encounter for general adult medical examination without abnormal findings: Secondary | ICD-10-CM | POA: Diagnosis not present

## 2020-02-28 DIAGNOSIS — F419 Anxiety disorder, unspecified: Secondary | ICD-10-CM

## 2020-02-28 DIAGNOSIS — I700 Atherosclerosis of aorta: Secondary | ICD-10-CM | POA: Diagnosis not present

## 2020-02-28 DIAGNOSIS — R7309 Other abnormal glucose: Secondary | ICD-10-CM | POA: Diagnosis not present

## 2020-02-28 NOTE — Progress Notes (Signed)
Courtney Ayers is a 74 y.o. female who presents for annual wellness visit and follow-up on chronic medical conditions.  She has a history of chronic respiratory failure with hypoxia and is presently on O2 therapy.  She is taking Advair as well as Incruse Ellipta and does use albuterol on a as needed basis.  She does have difficulty with anxiety dealing with this but seems to be fairly stable 100 present Celexa and BuSpar dosing with occasional use of Xanax.  She does have underlying allergies and is using Flonase and Singulair with good results.  She does have a history of atherosclerosis and is on atorvastatin.  She also has a history of colonic polyps but at this time is not interested in pursuing any further especially with the underlying history of COPD.  Immunizations and Health Maintenance Immunization History  Administered Date(s) Administered   Fluad Quad(high Dose 65+) 02/25/2019   Influenza Split 07/03/2011, 02/14/2012   Influenza Whole 05/16/2004   Influenza, High Dose Seasonal PF 03/18/2013, 03/24/2014, 03/08/2015, 03/27/2018   Influenza,inj,Quad PF,6+ Mos 03/21/2016, 05/07/2017   Influenza-Unspecified 03/27/2018   PFIZER SARS-COV-2 Vaccination 08/03/2019, 08/24/2019, 02/21/2020   PPD Test 05/30/1999   Pneumococcal Conjugate-13 03/24/2014, 02/12/2019   Pneumococcal Polysaccharide-23 08/06/2000, 01/30/2011   Td 09/23/2001   Tdap 01/30/2011   Zoster 03/05/2011   Zoster Recombinat (Shingrix) 12/20/2016, 03/27/2018, 03/27/2018   Health Maintenance Due  Topic Date Due   COLONOSCOPY  08/28/2016   MAMMOGRAM  08/29/2018   INFLUENZA VACCINE  01/09/2020    Last Pap smear: aged out  Last mammogram: 08/28/16 Last colonoscopy: 08/29/11 Last DEXA: 07/05/15 Dentist: Q six months  Ophtho: every two years Exercise: N/A  Other doctors caring for patient include: [facility]   Advanced directives: Does Patient Have a Medical Advance Directive?: Yes Type of Advance Directive:  Living will  Depression screen:  See questionnaire below.  Depression screen [facility] 2/9 02/28/2020 02/25/2019 09/24/2018 02/23/2018 05/15/2016  Decreased Interest 0 0 0 0 0  Down, Depressed, Hopeless 0 0 0 0 0  PHQ - 2 Score 0 0 0 0 0    Fall Risk Screen: see questionnaire below. Fall Risk  02/28/2020 02/25/2019 09/24/2018 02/23/2018 05/15/2016  Falls in the past year? 0 1 1 No No  Number falls in past yr: - 0 0 - -  Injury with Fall? - - 0 - -  Comment - - Bruise - -  Risk for fall due to : - Impaired balance/gait Impaired balance/gait - -  Follow up - - Education provided;Falls prevention discussed - -    ADL screen:  See questionnaire below Functional Status Survey: Is the patient deaf or have difficulty hearing?: No Does the patient have difficulty seeing, even when wearing glasses/contacts?: No Does the patient have difficulty concentrating, remembering, or making decisions?: No Does the patient have difficulty walking or climbing stairs?: Yes Does the patient have difficulty dressing or bathing?: No Does the patient have difficulty doing errands alone such as visiting a doctor's office or shopping?: No   Review of Systems Constitutional: -, -unexpected weight change, -anorexia, -fatigue Allergy: -sneezing, -itching, -congestion Dermatology: denies changing moles, rash, lumps ENT: -runny nose, -ear pain, -sore throat,  Cardiology:  -chest pain, -palpitations, -orthopnea, Respiratory: -cough, -shortness of breath, -dyspnea on exertion, -wheezing,  Gastroenterology: -abdominal pain, -nausea, -vomiting, -diarrhea, -constipation, -dysphagia Hematology: -bleeding or bruising problems Musculoskeletal: -arthralgias, -myalgias, -joint swelling, -back pain, - Ophthalmology: -vision changes,  Urology: -dysuria, -difficulty urinating,  -urinary frequency, -urgency, incontinence Neurology: -, -numbness, , -  memory loss, -falls, -dizziness    PHYSICAL EXAM:  LMP 06/10/1992   General  Appearance: Alert, cooperative, no distress, appears stated age Head: Normocephalic, without obvious abnormality, atraumatic Eyes: PERRL, conjunctiva/corneas clear, EOM's intact,  Ears: Normal TM's and external ear canals Nose: Nares normal, mucosa normal, no drainage or sinus tenderness Throat: Lips, mucosa, and tongue normal; teeth and gums normal Neck: Supple, no lymphadenopathy;  thyroid:  no enlargement/tenderness/nodules; no carotid bruit or JVD Lungs: Clear to auscultation bilaterally without wheezes, rales or ronchi; respirations unlabored Heart: Regular rate and rhythm, S1 and S2 normal, no murmur, rubor gallop  Extremities: No clubbing, cyanosis or edema Pulses: 2+ and symmetric all extremities Skin:  Skin color, texture, turgor normal, no rashes or lesions Lymph nodes: Cervical, supraclavicular normal Neurologic:  CNII-XII intact, normal strength, sensation and gait; reflexes 2+ and symmetric throughout Psych: Normal mood, affect, hygiene and grooming.  ASSESSMENT/PLAN: COPD with acute exacerbation ([place]) - Plan: CBC with Differential/Platelet, Comprehensive metabolic panel  Need for influenza vaccination - Plan: Flu Vaccine QUAD High Dose(Fluad), CANCELED: Flu vaccine HIGH DOSE PF (Fluzone High dose)  Chronic respiratory failure with hypoxia (HCC)  Non-seasonal allergic rhinitis due to pollen  Atherosclerosis of aorta (HCC) - Plan: Lipid panel  Anxiety  History of colonic polyps  Chronic obstructive pulmonary disease, unspecified COPD type ([place]) She will continue on her present medication regimen.  I agree with her not wanting to pursue colonic polyp evaluation.  She also is not interested in further mammograms.  Continue be followed mainly by pulmonary and continue on her oxygen.   Immunization recommendations discussed.  Colonoscopy recommendations reviewed   Medicare Attestation I have personally reviewed: The patient's medical and social history Their use of  alcohol, tobacco or illicit drugs Their current medications and supplements The patient's functional ability including ADLs,fall risks, home safety risks, cognitive, and hearing and visual impairment Diet and physical activities Evidence for depression or mood disorders  The patient's weight, height, and BMI have been recorded in the chart.  I have made referrals, counseling, and provided education to the patient based on review of the above and I have provided the patient with a written personalized care plan for preventive services.     Jill Alexanders, MD   02/28/2020

## 2020-02-28 NOTE — Patient Instructions (Signed)
°  Ms. Brander , Thank you for taking time to come for your Medicare Wellness Visit. I appreciate your ongoing commitment to your health goals. Please review the following plan we discussed and let me know if I can assist you in the future.   These are the goals we discussed: Goals   None     This is a list of the screening recommended for you and due dates:  Health Maintenance  Topic Date Due   Colon Cancer Screening  08/28/2016   Mammogram  08/29/2018   Tetanus Vaccine  01/29/2021   Flu Shot  Completed   DEXA scan (bone density measurement)  Completed   COVID-19 Vaccine  Completed    Hepatitis C: One time screening is recommended by Center for Disease Control  (CDC) for  adults born from [birth year] through 1966.   Completed   Pneumonia vaccines  Completed

## 2020-02-29 LAB — LIPID PANEL
Chol/HDL Ratio: 1.8 ratio (ref 0.0–4.4)
Cholesterol, Total: 147 mg/dL (ref 100–199)
HDL: 83 mg/dL (ref >39)
LDL Chol Calc (NIH): 44 mg/dL (ref 0–99)
Triglycerides: 116 mg/dL (ref 0–149)
VLDL Cholesterol Cal: 20 mg/dL (ref 5–40)

## 2020-02-29 LAB — COMPREHENSIVE METABOLIC PANEL
ALT: 22 IU/L (ref 0–32)
AST: 20 IU/L (ref 0–40)
Albumin/Globulin Ratio: 2.3 — ABNORMAL HIGH (ref 1.2–2.2)
Albumin: 4.4 g/dL (ref 3.7–4.7)
Alkaline Phosphatase: 109 IU/L (ref 44–121)
BUN/Creatinine Ratio: 19 (ref 12–28)
BUN: 15 mg/dL (ref 8–27)
Bilirubin Total: 0.3 mg/dL (ref 0.0–1.2)
CO2: 30 mmol/L — ABNORMAL HIGH (ref 20–29)
Calcium: 9.5 mg/dL (ref 8.7–10.3)
Chloride: 103 mmol/L (ref 96–106)
Creatinine, Ser: 0.78 mg/dL (ref 0.57–1.00)
GFR calc Af Amer: 87 mL/min/{1.73_m2} (ref >59)
GFR calc non Af Amer: 75 mL/min/{1.73_m2} (ref >59)
Globulin, Total: 1.9 g/dL (ref 1.5–4.5)
Glucose: 119 mg/dL — ABNORMAL HIGH (ref 65–99)
Potassium: 4.4 mmol/L (ref 3.5–5.2)
Sodium: 145 mmol/L — ABNORMAL HIGH (ref 134–144)
Total Protein: 6.3 g/dL (ref 6.0–8.5)

## 2020-02-29 LAB — CBC WITH DIFFERENTIAL/PLATELET
Basophils Absolute: 0 10*3/uL (ref 0.0–0.2)
Basos: 0 %
EOS (ABSOLUTE): 0 10*3/uL (ref 0.0–0.4)
Eos: 0 %
Hematocrit: 40.2 % (ref 34.0–46.6)
Hemoglobin: 13.2 g/dL (ref 11.1–15.9)
Immature Grans (Abs): 0 10*3/uL (ref 0.0–0.1)
Immature Granulocytes: 0 %
Lymphocytes Absolute: 1.2 10*3/uL (ref 0.7–3.1)
Lymphs: 18 %
MCH: 30.9 pg (ref 26.6–33.0)
MCHC: 32.8 g/dL (ref 31.5–35.7)
MCV: 94 fL (ref 79–97)
Monocytes Absolute: 0.5 10*3/uL (ref 0.1–0.9)
Monocytes: 7 %
Neutrophils Absolute: 4.9 10*3/uL (ref 1.4–7.0)
Neutrophils: 75 %
Platelets: 216 10*3/uL (ref 150–450)
RBC: 4.27 x10E6/uL (ref 3.77–5.28)
RDW: 12.1 % (ref 11.7–15.4)
WBC: 6.6 10*3/uL (ref 3.4–10.8)

## 2020-03-02 LAB — SPECIMEN STATUS REPORT

## 2020-03-02 LAB — HGB A1C W/O EAG: Hgb A1c MFr Bld: 5.3 % (ref 4.8–5.6)

## 2020-03-28 ENCOUNTER — Other Ambulatory Visit: Payer: Self-pay | Admitting: Critical Care Medicine

## 2020-03-28 DIAGNOSIS — J209 Acute bronchitis, unspecified: Secondary | ICD-10-CM

## 2020-03-28 DIAGNOSIS — J9611 Chronic respiratory failure with hypoxia: Secondary | ICD-10-CM

## 2020-04-07 ENCOUNTER — Other Ambulatory Visit: Payer: Self-pay | Admitting: Family Medicine

## 2020-04-07 DIAGNOSIS — F411 Generalized anxiety disorder: Secondary | ICD-10-CM

## 2020-04-07 NOTE — Telephone Encounter (Signed)
[facility] Is requesting to fill pt [facility]lex[facility]. Please advise [facility]

## 2020-04-26 ENCOUNTER — Other Ambulatory Visit: Payer: Self-pay | Admitting: Family Medicine

## 2020-04-26 NOTE — Telephone Encounter (Signed)
[facility] to [facility] buspar. Courtney Ayers

## 2020-05-05 IMAGING — DX DG CHEST 1V PORT
2 series · 2 of 2 positions shown · non-contrast
Comparison: 08/11/2018.

CLINICAL DATA: Acute on chronic respiratory failure with hypoxia
and hypercapnia.

EXAM:
PORTABLE CHEST 1 VIEW

[chest ap (1 of 2)]
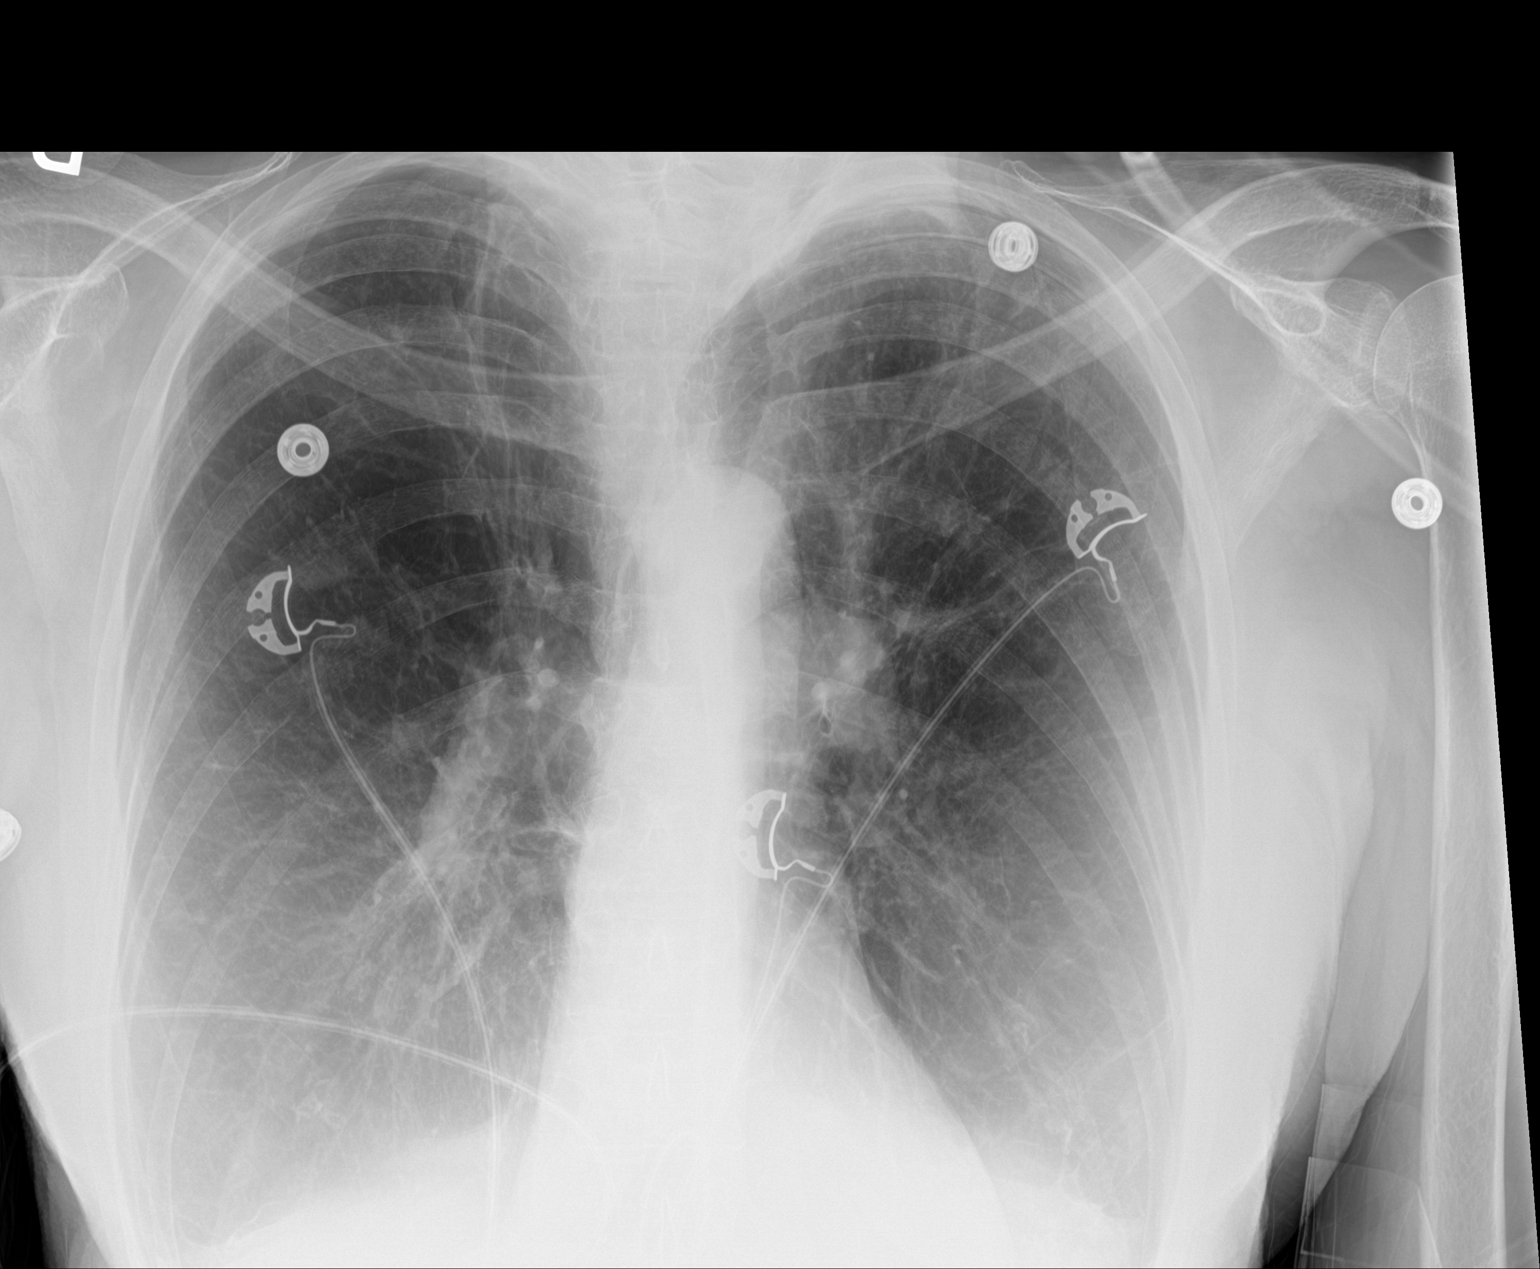

[chest ap (2 of 2)]
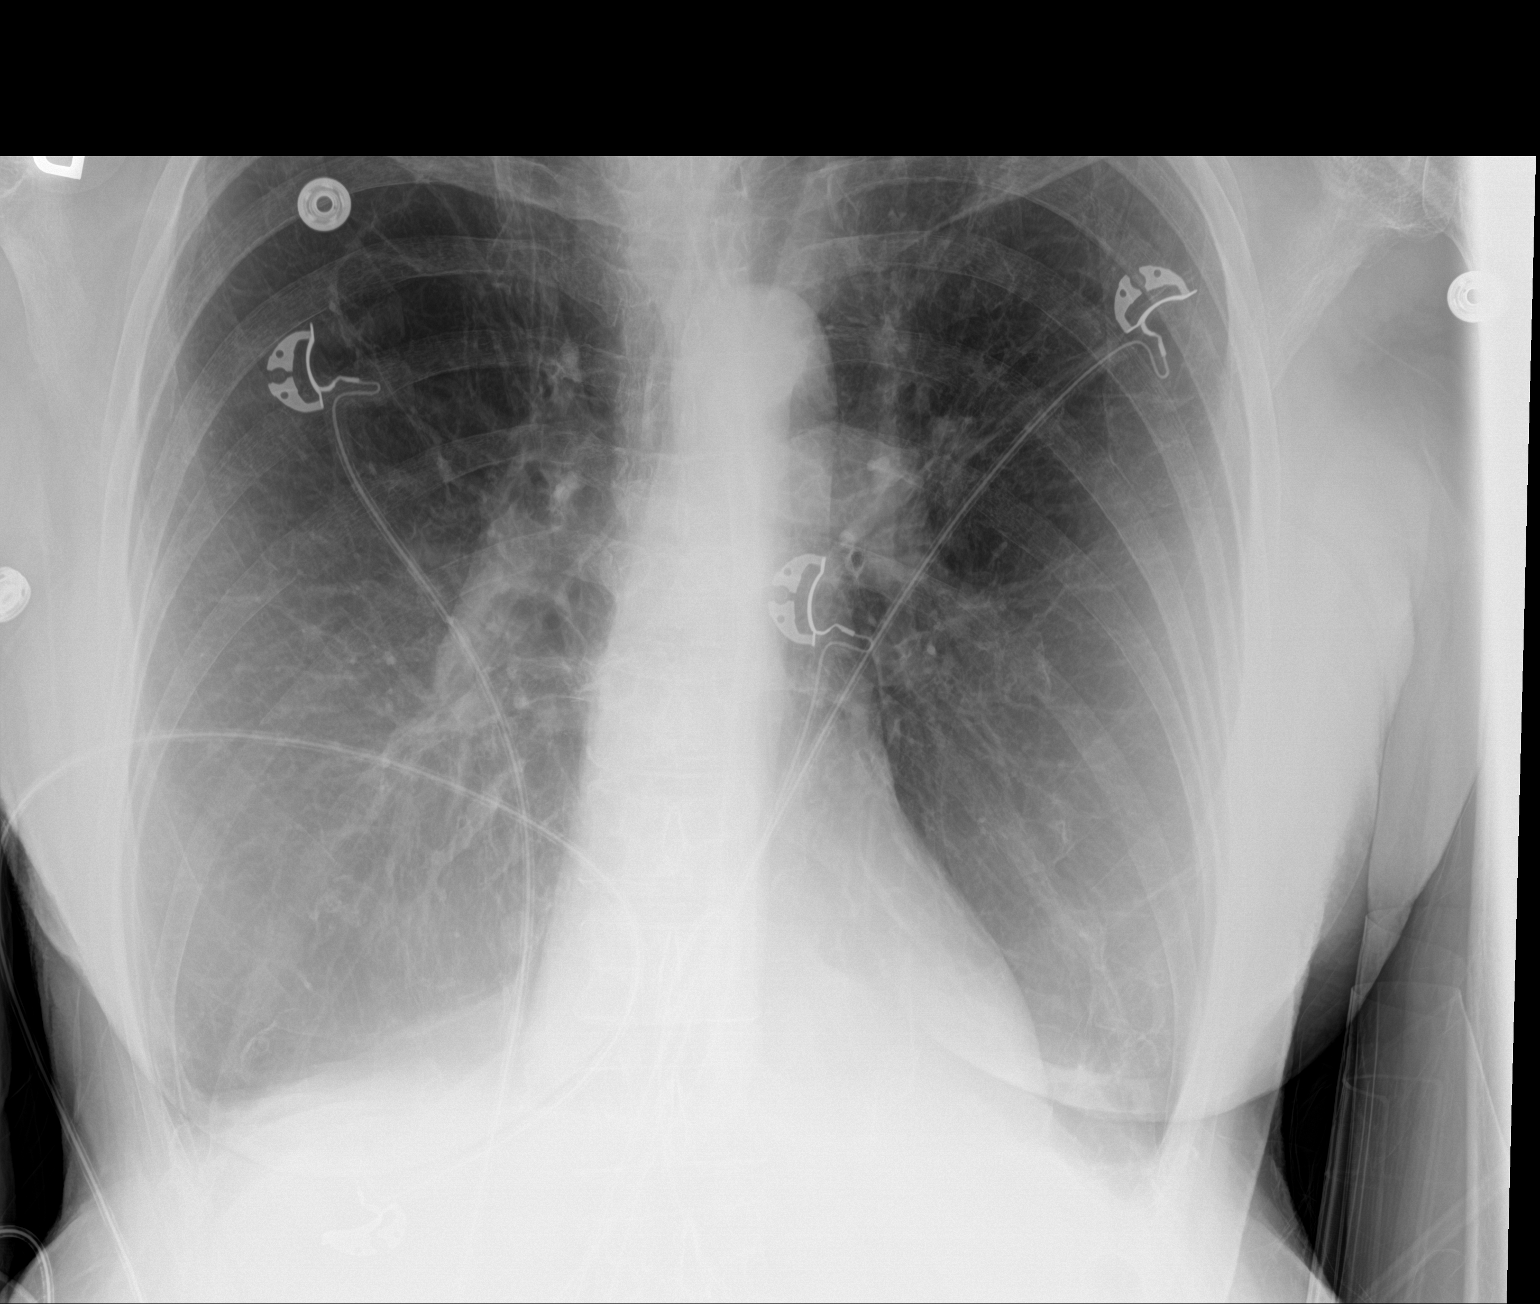

[2 of 2 positions shown; findings below may reference images not displayed]

FINDINGS: Cardiac silhouette is normal in size. No mediastinal or hilar masses
or evidence of adenopathy.

Lungs are hyperexpanded. Stable scarring most evident in the right
upper lobe. No evidence of pneumonia or pulmonary edema. No pleural
effusion or pneumothorax.

Skeletal structures are grossly intact.
IMPRESSION: 1. No acute cardiopulmonary disease.
2. Advanced COPD with chronic lung scarring.

## 2020-06-01 ENCOUNTER — Other Ambulatory Visit: Payer: Self-pay | Admitting: Critical Care Medicine

## 2020-06-01 DIAGNOSIS — J440 Chronic obstructive pulmonary disease with acute lower respiratory infection: Secondary | ICD-10-CM

## 2020-06-05 ENCOUNTER — Telehealth: Payer: Self-pay | Admitting: Critical Care Medicine

## 2020-06-05 ENCOUNTER — Other Ambulatory Visit: Payer: Self-pay | Admitting: Pulmonary Disease

## 2020-06-05 ENCOUNTER — Other Ambulatory Visit: Payer: Self-pay | Admitting: Critical Care Medicine

## 2020-06-05 DIAGNOSIS — J440 Chronic obstructive pulmonary disease with acute lower respiratory infection: Secondary | ICD-10-CM

## 2020-06-05 DIAGNOSIS — J209 Acute bronchitis, unspecified: Secondary | ICD-10-CM

## 2020-06-05 MED ORDER — INCRUSE ELLIPTA 62.5 MCG/INH IN AEPB: INHALATION_SPRAY | RESPIRATORY_TRACT | 1 refills | Status: DC

## 2020-06-05 MED ORDER — SODIUM CHLORIDE 3 % IN NEBU: INHALATION_SOLUTION | Freq: Two times a day (BID) | RESPIRATORY_TRACT | 11 refills | Status: DC | PRN

## 2020-06-05 NOTE — Telephone Encounter (Signed)
Spoke with pt, requesting refills on incruse and hypertonic saline.  These have been sent to preferred pharmacies.  Nothing further needed at this time- will close encounter.

## 2020-06-06 ENCOUNTER — Telehealth: Payer: Self-pay | Admitting: Critical Care Medicine

## 2020-06-06 DIAGNOSIS — J209 Acute bronchitis, unspecified: Secondary | ICD-10-CM

## 2020-06-06 MED ORDER — ALBUTEROL SULFATE HFA 108 (90 BASE) MCG/ACT IN AERS: 2.0000 | INHALATION_SPRAY | Freq: Four times a day (QID) | RESPIRATORY_TRACT | 3 refills | Status: DC | PRN

## 2020-06-06 NOTE — Telephone Encounter (Signed)
rx has been signed by BPM and will be faxed to the [place] pharmacy.  Pt is aware and nothing else is needed.

## 2020-08-24 ENCOUNTER — Telehealth: Payer: Self-pay | Admitting: Primary Care

## 2020-08-24 MED ORDER — ALBUTEROL SULFATE HFA 108 (90 BASE) MCG/ACT IN AERS: 2.0000 | INHALATION_SPRAY | Freq: Four times a day (QID) | RESPIRATORY_TRACT | 3 refills | Status: DC | PRN

## 2020-08-24 NOTE — Telephone Encounter (Signed)
Called and spoke with patient, advised that I could print out the script for her and she could pick it up, but we are not able to mail it or fax it to [place] since our providers are only licensed in the [place].  She verbalized understanding and stated she would send her sister to come and pick it up.  Advised it would be up front with her name on it.  Nothing further needed.

## 2020-08-29 ENCOUNTER — Ambulatory Visit: Payer: Medicare Other | Admitting: Primary Care

## 2020-08-29 NOTE — Progress Notes (Deleted)
@Patient  ID: Courtney Ayers, female    DOB: August 07, 1945, 75 y.o.   MRN: [id number]  No chief complaint on file.   Referring provider: [name], MD  HPI: 75 year old female, former smoker quit in 2016 (45 pack year history).  Past medical history significant for hypertension, arthrosclerosis of aorta, COPD, chronic respiratory failure with hypoxia, 12 pulmonary nodules, allergic rhinitis, osteopenia.  Former patient of Dr. Carlis Abbott, office on 12/21/2019.  08/29/2020 Presents today overdue follow-up/COPD and chronic respiratory failure.  Maintained on Advair 1 puff twice daily and Incruse. Singulair 10 mg at bedtime, antihistamine, Flonase and Mucinex as needed Hypertonic saline twice daily and flutter valve Supplemental oxygen 2 L with exertion and at night     No Known Allergies  Immunization History  Administered Date(s) Administered  . Fluad Quad(high Dose 65+) 02/25/2019, 02/28/2020  . Influenza Split 07/03/2011, 02/14/2012  . Influenza Whole 05/16/2004  . Influenza, High Dose Seasonal PF 03/18/2013, 03/24/2014, 03/08/2015, 03/27/2018  . Influenza,inj,Quad PF,6+ Mos 03/21/2016, 05/07/2017  . Influenza-Unspecified 03/27/2018  . PFIZER(Purple Top)SARS-COV-2 Vaccination 08/03/2019, 08/24/2019, 02/21/2020  . PPD Test 05/30/1999  . Pneumococcal Conjugate-13 03/24/2014, 02/12/2019  . Pneumococcal Polysaccharide-23 08/06/2000, 01/30/2011  . Td 09/23/2001  . Tdap 01/30/2011  . Zoster 03/05/2011  . Zoster Recombinat (Shingrix) 12/20/2016, 03/27/2018, 03/27/2018    Past Medical History:  Diagnosis Date  . Adenomatous colon polyp   . Allergic rhinitis, cause unspecified    seasonal  . COPD (chronic obstructive pulmonary disease) ([place])    no home O2  . Depressive disorder, not elsewhere classified 2006   some anxiety recurs when she has stopped med in past  . OA (osteoarthritis)    B THUMB, RIGHT > LEFT HIP, BACK  . Osteopenia     Tobacco History: Social History    Tobacco Use  Smoking Status Former Smoker  . Packs/day: 1.00  . Years: 45.00  . Pack years: 45.00  . Types: Cigarettes  . Quit date: 08/05/2014  . Years since quitting: 6.0  Smokeless Tobacco Never Used   Counseling given: Not Answered   Outpatient Medications Prior to Visit  Medication Sig Dispense Refill  . busPIRone (BUSPAR) 7.5 MG tablet TAKE 1 TABLET BY MOUTH THREE TIMES A DAY 270 tablet 2  . citalopram (CELEXA) 10 MG tablet TAKE 1 TABLET BY MOUTH EVERY DAY 90 tablet 3  . albuterol (PROVENTIL) (2.5 MG/3ML) 0.083% nebulizer solution USE 1 VIAL VIA NEBULIZER EVERY 2 HOURS AS NEEDED FOR SHORTNESS OF BREATH 375 mL 5  . albuterol (VENTOLIN HFA) 108 (90 Base) MCG/ACT inhaler Inhale 2 puffs into the lungs every 6 (six) hours as needed for wheezing or shortness of breath. 20.1 g 3  . albuterol (VENTOLIN HFA) 108 (90 Base) MCG/ACT inhaler Inhale 2 puffs into the lungs every 6 (six) hours as needed for wheezing or shortness of breath. 8 g 3  . ALPRAZolam (XANAX) 0.5 MG tablet Take 0.5 mg by mouth at bedtime as needed for anxiety.    [name] atorvastatin (LIPITOR) 10 MG tablet TAKE 1 TABLET BY MOUTH EVERY DAY 90 tablet 3  . Calcium Carbonate-Vitamin D (CALCIUM-VITAMIN D) 500-200 MG-UNIT tablet Take 1 tablet by mouth daily.    . fluticasone (FLONASE) 50 MCG/ACT nasal spray Place 2 sprays into both nostrils daily. 16 g 5  . Fluticasone-Salmeterol (ADVAIR DISKUS) 250-50 MCG/DOSE AEPB INHALE 1 PUFF INTO THE LUNGS EVERY 12 HOURS. 180 each 2  . guaiFENesin (MUCINEX) 600 MG 12 hr tablet Take 1,200 mg by mouth 2 (  two) times daily.     . montelukast (SINGULAIR) 10 MG tablet TAKE 1 TABLET BY MOUTH EVERYDAY AT BEDTIME 90 tablet 3  . Multiple Vitamin (MULTI VITAMIN DAILY PO) Take by mouth.    . OXYGEN Inhale 2 L into the lungs.    . sodium chloride HYPERTONIC 3 % nebulizer solution Take by nebulization 2 (two) times daily as needed for other. 360 mL 11  . umeclidinium bromide (INCRUSE ELLIPTA) 62.5 MCG/INH  AEPB TAKE 1 PUFF BY MOUTH EVERY DAY 90 each 1   No facility-administered medications prior to visit.      Review of Systems  Review of Systems   Physical Exam  LMP 06/10/1992  Physical Exam   Lab Results:  CBC    Component Value Date/Time   WBC 6.6 02/28/2020 1507   WBC 9.3 09/03/2018 0440   RBC 4.27 02/28/2020 1507   RBC 3.46 (L) 09/03/2018 0440   HGB 13.2 02/28/2020 1507   HCT 40.2 02/28/2020 1507   PLT 216 02/28/2020 1507   MCV 94 02/28/2020 1507   MCH 30.9 02/28/2020 1507   MCH 30.3 09/03/2018 0440   MCHC 32.8 02/28/2020 1507   MCHC 29.7 (L) 09/03/2018 0440   RDW 12.1 02/28/2020 1507   LYMPHSABS 1.2 02/28/2020 1507   MONOABS 0.6 09/02/2018 0508   EOSABS 0.0 02/28/2020 1507   BASOSABS 0.0 02/28/2020 1507    BMET    Component Value Date/Time   NA 145 (H) 02/28/2020 1507   K 4.4 02/28/2020 1507   CL 103 02/28/2020 1507   CO2 30 (H) 02/28/2020 1507   GLUCOSE 119 (H) 02/28/2020 1507   GLUCOSE 134 (H) 09/03/2018 0440   BUN 15 02/28/2020 1507   CREATININE 0.78 02/28/2020 1507   CREATININE 0.66 07/01/2016 1404   CALCIUM 9.5 02/28/2020 1507   GFRNONAA 75 02/28/2020 1507   GFRAA 87 02/28/2020 1507    BNP    Component Value Date/Time   BNP 111.7 (H) 08/11/2018 1729    ProBNP No results found for: PROBNP  Imaging: No results found.   Assessment & Plan:   No problem-specific Assessment & Plan notes found for this encounter.     Martyn Ehrich, NP 08/29/2020

## 2020-09-01 ENCOUNTER — Telehealth: Payer: Self-pay | Admitting: Primary Care

## 2020-09-01 DIAGNOSIS — J209 Acute bronchitis, unspecified: Secondary | ICD-10-CM

## 2020-09-01 MED ORDER — ALBUTEROL SULFATE HFA 108 (90 BASE) MCG/ACT IN AERS: 2.0000 | INHALATION_SPRAY | Freq: Four times a day (QID) | RESPIRATORY_TRACT | 3 refills | Status: DC | PRN

## 2020-09-01 NOTE — Telephone Encounter (Signed)
Called and spoke with patient. She is aware Aaron Edelman has signed the RX and I have faxed this for her.   Nothing further needed at time of call.

## 2020-09-01 NOTE — Telephone Encounter (Signed)
Yes ok to sign   Marina Desire FNP

## 2020-09-01 NOTE — Telephone Encounter (Signed)
Called and spoke with pt who states she was needing to have Rx for her ventolin to be sent in for 18g instead of 8g which was originally faxed to [facility]. Pt does have an upcoming appt with Beth.  Pt is a former Dr. Carlis Abbott pt. Aaron Edelman, please advise if you would be okay signing Rx for pt so we can get it faxed to [facility] (please see previous encounter from 3/17 for fax number).

## 2020-09-05 ENCOUNTER — Telehealth: Payer: Self-pay | Admitting: Primary Care

## 2020-09-05 ENCOUNTER — Ambulatory Visit (INDEPENDENT_AMBULATORY_CARE_PROVIDER_SITE_OTHER): Payer: Medicare Other

## 2020-09-05 ENCOUNTER — Ambulatory Visit (INDEPENDENT_AMBULATORY_CARE_PROVIDER_SITE_OTHER): Payer: Medicare Other | Admitting: Primary Care

## 2020-09-05 ENCOUNTER — Other Ambulatory Visit: Payer: Self-pay

## 2020-09-05 ENCOUNTER — Encounter: Payer: Self-pay | Admitting: Primary Care

## 2020-09-05 VITALS — BP 108/68 | HR 87 | Temp 97.9°F | Ht 67.5 in | Wt 156.8 lb

## 2020-09-05 DIAGNOSIS — J449 Chronic obstructive pulmonary disease, unspecified: Secondary | ICD-10-CM

## 2020-09-05 DIAGNOSIS — J439 Emphysema, unspecified: Secondary | ICD-10-CM | POA: Diagnosis not present

## 2020-09-05 DIAGNOSIS — J9611 Chronic respiratory failure with hypoxia: Secondary | ICD-10-CM

## 2020-09-05 MED ORDER — BREZTRI AEROSPHERE 160-9-4.8 MCG/ACT IN AERO: 2.0000 | INHALATION_SPRAY | Freq: Two times a day (BID) | RESPIRATORY_TRACT | 0 refills | Status: DC

## 2020-09-05 MED ORDER — SPACER/AERO-HOLDING CHAMBERS DEVI: 1.0000 | Freq: Two times a day (BID) | 0 refills | Status: AC

## 2020-09-05 NOTE — Assessment & Plan Note (Addendum)
-   Gold D. Breathing is worse, not acutely exacerbated. No recent hospitalizations - Recommend trial Breztri aerosphere 2 puffs BID with spacer  - Continue Mucinex 1200mg  twice daily and hypertonic saline nebulizer twice daily followed by flutter valve  - Checking CXR today and sputum culture - FU in 3 months with new [facility] pulmonary MD (previous Dr. Carlis Abbott patient)

## 2020-09-05 NOTE — Progress Notes (Signed)
@Patient  ID: Courtney Ayers, female    DOB: December 21, 1945, 75 y.o.   MRN: [id number]  Chief Complaint  Patient presents with  . Follow-up    SOB increased/ dry cough     Referring provider: [name], MD  HPI: 75 year old female, former smoker quit in 2016 (45 pack year history).  Past medical history significant for hypertension, arthrosclerosis of aorta, COPD, chronic respiratory failure with hypoxia, 12 pulmonary nodules, allergic rhinitis, osteopenia.  Former patient of Dr. Carlis Abbott, office on 12/21/2019.  09/05/2020 Presents today overdue follow-up/COPD GOLD D and chronic respiratory failure. Accompanied by her daughter. She was treated for COPD exacerbation by her PCP in September 2021. No hospitalizations. Breathing is slightly worse but not acutely exacerbated. She has moderate-severe dyspnea with exertion. She has a congested cough with difficulty getting mucus up. She has been using Adviar diskus 250-50 and incruse as prescribed. She uses hypertonic saline nebulizer at night. She has a version of a flutter valve that she uses three times a day. She is taking 1,200mg  mucinex twice a day. She is on 2L oxygen. She continues to not want to purse further diagnostic imaging for pulmonary nodules. She needs letter excusing her from [employer] d.t her chronic conditions. Denies f/c/s, chest tightness, wheezing, hemoptysis, N/V/D.   No Known Allergies  Immunization History  Administered Date(s) Administered  . Fluad Quad(high Dose 65+) 02/25/2019, 02/28/2020  . Influenza Split 07/03/2011, 02/14/2012  . Influenza Whole 05/16/2004  . Influenza, High Dose Seasonal PF 03/18/2013, 03/24/2014, 03/08/2015, 03/27/2018  . Influenza,inj,Quad PF,6+ Mos 03/21/2016, 05/07/2017  . Influenza-Unspecified 03/27/2018  . PFIZER(Purple Top)SARS-COV-2 Vaccination 08/03/2019, 08/24/2019, 02/21/2020  . PPD Test 05/30/1999  . Pneumococcal Conjugate-13 03/24/2014, 02/12/2019  . Pneumococcal Polysaccharide-23  08/06/2000, 01/30/2011  . Td 09/23/2001  . Tdap 01/30/2011  . Zoster 03/05/2011  . Zoster Recombinat (Shingrix) 12/20/2016, 03/27/2018, 03/27/2018    Past Medical History:  Diagnosis Date  . Adenomatous colon polyp   . Allergic rhinitis, cause unspecified    seasonal  . COPD (chronic obstructive pulmonary disease) ([place])    no home O2  . Depressive disorder, not elsewhere classified 2006   some anxiety recurs when she has stopped med in past  . OA (osteoarthritis)    B THUMB, RIGHT > LEFT HIP, BACK  . Osteopenia     Tobacco History: Social History   Tobacco Use  Smoking Status Former Smoker  . Packs/day: 1.00  . Years: 45.00  . Pack years: 45.00  . Types: Cigarettes  . Quit date: 08/05/2014  . Years since quitting: 6.0  Smokeless Tobacco Never Used   Counseling given: Not Answered   Outpatient Medications Prior to Visit  Medication Sig Dispense Refill  . albuterol (PROVENTIL) (2.5 MG/3ML) 0.083% nebulizer solution USE 1 VIAL VIA NEBULIZER EVERY 2 HOURS AS NEEDED FOR SHORTNESS OF BREATH 375 mL 5  . albuterol (VENTOLIN HFA) 108 (90 Base) MCG/ACT inhaler Inhale 2 puffs into the lungs every 6 (six) hours as needed for wheezing or shortness of breath. 20.1 g 3  . albuterol (VENTOLIN HFA) 108 (90 Base) MCG/ACT inhaler Inhale 2 puffs into the lungs every 6 (six) hours as needed for wheezing or shortness of breath. 18 g 3  . ALPRAZolam (XANAX) 0.5 MG tablet Take 0.5 mg by mouth at bedtime as needed for anxiety.    [name] atorvastatin (LIPITOR) 10 MG tablet TAKE 1 TABLET BY MOUTH EVERY DAY 90 tablet 3  . busPIRone (BUSPAR) 7.5 MG tablet TAKE 1 TABLET BY  MOUTH THREE TIMES A DAY 270 tablet 2  . citalopram (CELEXA) 10 MG tablet TAKE 1 TABLET BY MOUTH EVERY DAY 90 tablet 3  . Fluticasone-Salmeterol (ADVAIR DISKUS) 250-50 MCG/DOSE AEPB INHALE 1 PUFF INTO THE LUNGS EVERY 12 HOURS. 180 each 2  . guaiFENesin (MUCINEX) 600 MG 12 hr tablet Take 1,200 mg by mouth 2 (two) times daily.     .  montelukast (SINGULAIR) 10 MG tablet TAKE 1 TABLET BY MOUTH EVERYDAY AT BEDTIME 90 tablet 3  . OXYGEN Inhale 2 L into the lungs.    . sodium chloride HYPERTONIC 3 % nebulizer solution Take by nebulization 2 (two) times daily as needed for other. 360 mL 11  . umeclidinium bromide (INCRUSE ELLIPTA) 62.5 MCG/INH AEPB TAKE 1 PUFF BY MOUTH EVERY DAY 90 each 1  . Calcium Carbonate-Vitamin D (CALCIUM-VITAMIN D) 500-200 MG-UNIT tablet Take 1 tablet by mouth daily. (Patient not taking: Reported on 09/05/2020)    . fluticasone (FLONASE) 50 MCG/ACT nasal spray Place 2 sprays into both nostrils daily. 16 g 5  . Multiple Vitamin (MULTI VITAMIN DAILY PO) Take by mouth.     No facility-administered medications prior to visit.   Review of Systems  Review of Systems  Respiratory: Positive for cough and shortness of breath.   Cardiovascular: Positive for leg swelling.   Physical Exam  BP 108/68 (BP Location: Left Arm, Cuff Size: Normal)   Pulse 87   Temp 97.9 F (36.6 C) (Temporal)   Ht 5' 7.5" (1.715 m)   Wt 156 lb 12.8 oz (71.1 kg)   LMP 06/10/1992   SpO2 99%   BMI 24.20 kg/m  Physical Exam Constitutional:      Appearance: Normal appearance.  HENT:     Head: Normocephalic and atraumatic.     Mouth/Throat:     Mouth: Mucous membranes are moist.     Pharynx: Oropharynx is clear.  Cardiovascular:     Rate and Rhythm: Normal rate and regular rhythm.  Pulmonary:     Effort: Pulmonary effort is normal.     Breath sounds: Normal breath sounds.  Skin:    General: Skin is warm and dry.  Neurological:     General: No focal deficit present.     Mental Status: She is alert and oriented to person, place, and time. Mental status is at baseline.  Psychiatric:        Mood and Affect: Mood normal.        Behavior: Behavior normal.        Thought Content: Thought content normal.        Judgment: Judgment normal.      Lab Results:  CBC    Component Value Date/Time   WBC 6.6 02/28/2020 1507    WBC 9.3 09/03/2018 0440   RBC 4.27 02/28/2020 1507   RBC 3.46 (L) 09/03/2018 0440   HGB 13.2 02/28/2020 1507   HCT 40.2 02/28/2020 1507   PLT 216 02/28/2020 1507   MCV 94 02/28/2020 1507   MCH 30.9 02/28/2020 1507   MCH 30.3 09/03/2018 0440   MCHC 32.8 02/28/2020 1507   MCHC 29.7 (L) 09/03/2018 0440   RDW 12.1 02/28/2020 1507   LYMPHSABS 1.2 02/28/2020 1507   MONOABS 0.6 09/02/2018 0508   EOSABS 0.0 02/28/2020 1507   BASOSABS 0.0 02/28/2020 1507    BMET    Component Value Date/Time   NA 145 (H) 02/28/2020 1507   K 4.4 02/28/2020 1507   CL 103 02/28/2020 1507   CO2  30 (H) 02/28/2020 1507   GLUCOSE 119 (H) 02/28/2020 1507   GLUCOSE 134 (H) 09/03/2018 0440   BUN 15 02/28/2020 1507   CREATININE 0.78 02/28/2020 1507   CREATININE 0.66 07/01/2016 1404   CALCIUM 9.5 02/28/2020 1507   GFRNONAA 75 02/28/2020 1507   GFRAA 87 02/28/2020 1507    BNP    Component Value Date/Time   BNP 111.7 (H) 08/11/2018 1729    ProBNP No results found for: PROBNP  Imaging: No results found.   Assessment & Plan:   COPD (chronic obstructive pulmonary disease) ([facility]) - Gold D. Breathing is worse, not acutely exacerbated. No recent hospitalizations - Recommend trial Breztri aerosphere 2 puffs BID with spacer  - Continue Mucinex 1200mg  twice daily and hypertonic saline nebulizer twice daily followed by flutter valve  - Checking CXR today and sputum culture - FU in 3 months with new [facility] pulmonary MD (previous Dr. Carlis Abbott patient)  Chronic respiratory failure with hypoxia ([place]) - Continues to benefit from supplemental oxygen - Wearing 2L on exertion and at bedtime, she re-qualified for oxygen in July 2021   Martyn Ehrich, NP 09/05/2020

## 2020-09-05 NOTE — Assessment & Plan Note (Addendum)
-   Continues to benefit from supplemental oxygen - Wearing 2L on exertion and at bedtime, she re-qualified for oxygen in July 2021

## 2020-09-05 NOTE — Telephone Encounter (Signed)
Patient needs letter excusing her from [employer] d/t severe COPD and chronic respiratory failure O2 dependent. We needs juror number. Left message for patient to call back.

## 2020-09-05 NOTE — Patient Instructions (Addendum)
Recommendations: - Trial Breztri, take two puffs morning and evening (rinse mouth after- use with spacer) - Hold Advair/Incruse while using Breztri. If your breathing improves with new inhaler let us know and we will send in prescription. If not better can return to previous maintenance inhalers.  - Continue Singulair 10 mg at bedtime - Continue Mucinex 1200mg  twice daily - Continue hypertonic saline nebulizer, try using twice daily followed by flutter valve (or similar device)  - Continue supplemental oxygen 2 L with exertion and at night   Orders: - Sputum culture - CXR today   Follow-up: - 3 months with either Dr. Erin Fulling or Hunsucker (new patient/COPD)   Chronic Obstructive Pulmonary Disease  Chronic obstructive pulmonary disease (COPD) is a long-term (chronic) lung problem. When you have COPD, it is hard for air to get in and out of your lungs. Usually the condition gets worse over time, and your lungs will never return to normal. There are things you can do to keep yourself as healthy as possible. What are the causes?  Smoking. This is the most common cause.  Certain genes passed from parent to child (inherited). What increases the risk?  Being exposed to secondhand smoke from cigarettes, pipes, or cigars.  Being exposed to chemicals and other irritants, such as fumes and dust in the work environment.  Having chronic lung conditions or infections. What are the signs or symptoms?  Shortness of breath, especially during physical activity.  A long-term cough with a large amount of thick mucus. Sometimes, the cough may not have any mucus (dry cough).  Wheezing.  Breathing quickly.  Skin that looks gray or blue, especially in the fingers, toes, or lips.  Feeling tired (fatigue).  Weight loss.  Chest tightness.  Having infections often.  Episodes when breathing symptoms become much worse (exacerbations). At the later stages of this disease, you may have swelling in  the ankles, feet, or legs. How is this treated?  Taking medicines.  Quitting smoking, if you smoke.  Rehabilitation. This includes steps to make your body work better. It may involve a team of specialists.  Doing exercises.  Making changes to your diet.  Using oxygen.  Lung surgery.  Lung transplant.  Comfort measures (palliative care). Follow these instructions at home: Medicines  Take over-the-counter and prescription medicines only as told by your doctor.  Talk to your doctor before taking any cough or allergy medicines. You may need to avoid medicines that cause your lungs to be dry. Lifestyle  If you smoke, stop smoking. Smoking makes the problem worse.  Do not smoke or use any products that contain nicotine or tobacco. If you need help quitting, ask your doctor.  Avoid being around things that make your breathing worse. This may include smoke, chemicals, and fumes.  Stay active, but remember to rest as well.  Learn and use tips on how to manage stress and control your breathing.  Make sure you get enough sleep. Most adults need at least 7 hours of sleep every night.  Eat healthy foods. Eat smaller meals more often. Rest before meals. Controlled breathing Learn and use tips on how to control your breathing as told by your doctor. Try:  Breathing in (inhaling) through your nose for 1 second. Then, pucker your lips and breath out (exhale) through your lips for 2 seconds.  Putting one hand on your belly (abdomen). Breathe in slowly through your nose for 1 second. Your hand on your belly should move out. Pucker your lips  and breathe out slowly through your lips. Your hand on your belly should move in as you breathe out.   Controlled coughing Learn and use controlled coughing to clear mucus from your lungs. Follow these steps: 1. Lean your head a little forward. 2. Breathe in deeply. 3. Try to hold your breath for 3 seconds. 4. Keep your mouth slightly open while  coughing 2 times. 5. Spit any mucus out into a tissue. 6. Rest and do the steps again 1 or 2 times as needed. General instructions  Make sure you get all the shots (vaccines) that your doctor recommends. Ask your doctor about a flu shot and a pneumonia shot.  Use oxygen therapy and pulmonary rehabilitation if told by your doctor. If you need home oxygen therapy, ask your doctor if you should buy a tool to measure your oxygen level (oximeter).  Make a COPD action plan with your doctor. This helps you to know what to do if you feel worse than usual.  Manage any other conditions you have as told by your doctor.  Avoid going outside when it is very hot, cold, or humid.  Avoid people who have a sickness you can catch (contagious).  Keep all follow-up visits. Contact a doctor if:  You cough up more mucus than usual.  There is a change in the color or thickness of the mucus.  It is harder to breathe than usual.  Your breathing is faster than usual.  You have trouble sleeping.  You need to use your medicines more often than usual.  You have trouble doing your normal activities such as getting dressed or walking around the house. Get help right away if:  You have shortness of breath while resting.  You have shortness of breath that stops you from: ? Being able to talk. ? Doing normal activities.  Your chest hurts for longer than 5 minutes.  Your skin color is more blue than usual.  Your pulse oximeter shows that you have low oxygen for longer than 5 minutes.  You have a fever.  You feel too tired to breathe normally. These symptoms may represent a serious problem that is an emergency. Do not wait to see if the symptoms will go away. Get medical help right away. Call your local emergency services (911 in the [place] Do not drive yourself to the hospital. Summary  Chronic obstructive pulmonary disease (COPD) is a long-term lung problem.  The way your lungs work will never  return to normal. Usually the condition gets worse over time. There are things you can do to keep yourself as healthy as possible.  Take over-the-counter and prescription medicines only as told by your doctor.  If you smoke, stop. Smoking makes the problem worse. This information is not intended to replace advice given to you by your health care provider. Make sure you discuss any questions you have with your health care provider. Document Revised: 04/04/2020 Document Reviewed: 04/04/2020 Elsevier Patient Education  2021 [employer].

## 2020-09-06 NOTE — Telephone Encounter (Signed)
Spoke with pt and completed juror letter. Will place in mail to be mailed out tomorrow. Pt stated understanding. Nothing further needed at this time.

## 2020-09-06 NOTE — Progress Notes (Signed)
CXR showed no acute process; hyperexpansion of lung consistent with COPD/emphysema. I gave her sample of breztri, lets see how she does with that over the next 2 weeks

## 2020-09-06 NOTE — Telephone Encounter (Signed)
Called and spoke with patient, she states she did not have to go through with jury duty, she would like something on file for if she is summoned again.  I advised that we do have to get in touch with the provider and draw up the letter that includes her jury duty number.  She states she will call the courthouse to get that number and she will call us back.  Will await her return call.

## 2020-09-11 ENCOUNTER — Telehealth: Payer: Self-pay | Admitting: Primary Care

## 2020-09-11 MED ORDER — BREZTRI AEROSPHERE 160-9-4.8 MCG/ACT IN AERO: 2.0000 | INHALATION_SPRAY | Freq: Two times a day (BID) | RESPIRATORY_TRACT | 6 refills | Status: DC

## 2020-09-11 NOTE — Telephone Encounter (Signed)
Refill of the breztri has been sent to the pharmacy per the pts request.  Nothing further is needed.

## 2020-10-16 ENCOUNTER — Telehealth: Payer: Self-pay | Admitting: Primary Care

## 2020-10-16 NOTE — Telephone Encounter (Signed)
Called and went over [facility] NP response/recommendations with patient. All questions answered and patient expressed full understanding with teach back. Nothing further needed at this time.

## 2020-10-16 NOTE — Telephone Encounter (Signed)
Called and spoke with patient who stated she is calling because her sister that lives with her had symptoms and her sister took a home covid test and tested postive for COVID yesterday, states her sister is wearing her mask in home and they are trying to keep a distance as much as they can in the home away from each other.  Pt is wondering what she needs to do and wants to know if she starts to have symptoms is there some type of protocol like medications she can take? Patient states she is currently not experiencing symptoms at this time. Patient states she used her last home test last week and it was negative and may get more today. Patient stated she had two covid shots and a booster.  [name] please advise

## 2020-10-16 NOTE — Telephone Encounter (Signed)
I would do a home test in 5 days or sooner if she begins to have symptoms. If she tests positive there is outpatient treatment we can offer her, so she should notify us as soon as possible if she does. Otherwise masking, hand washing, isolation.

## 2020-10-22 DIAGNOSIS — U071 COVID-19: Secondary | ICD-10-CM | POA: Diagnosis not present

## 2020-11-13 ENCOUNTER — Other Ambulatory Visit: Payer: Self-pay | Admitting: *Deleted

## 2020-11-13 NOTE — Patient Outreach (Signed)
[facility]) Care Management  11/13/2020  Courtney Ayers [birth year] [id number]   Referral Date: 6/2 Referral Source: Self, call to triage line Referral Reason: Question about COPD programs in community Insurance: D[employer]   Outreach attempt #1, successful.  Identity verified.  This care manager introduced self and stated purpose of call.  [facility] care management services explained.    Social: Member is independent, managing health conditions well.  State she was wondering if there was anything different that what she has been doing that would manage her COPD.  State she has had condition for years, very familiar with how to stay healthy.  She's using inhalers, nebulizers, and O2 as instructed, no admissions or ED visits in over 2 years.  Does not feel [facility] will add additional value to management at this time but state she will call this care manager if she should change her mind.    Conditions: Per chart, has history of HTN, COPD, diverticulosis, and anxiety.    Appointments:  Had visit with pulmonology NP on 3/29, will see MD later this month.  Will follow up with PCP in September for annual wellness visit.  No concern for transportation needs.  Plan: RN CM will send successful outreach letter with this care manager's contact information.  Will close case as member denies needs at this time.  Valente David, [place], MSN [place] [phone]

## 2020-12-22 ENCOUNTER — Other Ambulatory Visit: Payer: Self-pay | Admitting: Family Medicine

## 2020-12-22 DIAGNOSIS — I700 Atherosclerosis of aorta: Secondary | ICD-10-CM

## 2021-02-28 ENCOUNTER — Other Ambulatory Visit: Payer: Self-pay

## 2021-02-28 ENCOUNTER — Ambulatory Visit (INDEPENDENT_AMBULATORY_CARE_PROVIDER_SITE_OTHER): Payer: Medicare Other | Admitting: Family Medicine

## 2021-02-28 ENCOUNTER — Encounter: Payer: Self-pay | Admitting: Family Medicine

## 2021-02-28 VITALS — BP 130/82 | HR 81 | Temp 97.4°F | Ht 67.5 in | Wt 154.0 lb

## 2021-02-28 DIAGNOSIS — M810 Age-related osteoporosis without current pathological fracture: Secondary | ICD-10-CM

## 2021-02-28 DIAGNOSIS — R7301 Impaired fasting glucose: Secondary | ICD-10-CM | POA: Diagnosis not present

## 2021-02-28 DIAGNOSIS — I700 Atherosclerosis of aorta: Secondary | ICD-10-CM | POA: Diagnosis not present

## 2021-02-28 DIAGNOSIS — Z86010 Personal history of colonic polyps: Secondary | ICD-10-CM

## 2021-02-28 DIAGNOSIS — R739 Hyperglycemia, unspecified: Secondary | ICD-10-CM

## 2021-02-28 DIAGNOSIS — J9611 Chronic respiratory failure with hypoxia: Secondary | ICD-10-CM

## 2021-02-28 DIAGNOSIS — E2839 Other primary ovarian failure: Secondary | ICD-10-CM

## 2021-02-28 DIAGNOSIS — F411 Generalized anxiety disorder: Secondary | ICD-10-CM | POA: Diagnosis not present

## 2021-02-28 DIAGNOSIS — Z13220 Encounter for screening for lipoid disorders: Secondary | ICD-10-CM | POA: Diagnosis not present

## 2021-02-28 DIAGNOSIS — Z85820 Personal history of malignant melanoma of skin: Secondary | ICD-10-CM

## 2021-02-28 DIAGNOSIS — Z23 Encounter for immunization: Secondary | ICD-10-CM | POA: Diagnosis not present

## 2021-02-28 DIAGNOSIS — M8580 Other specified disorders of bone density and structure, unspecified site: Secondary | ICD-10-CM

## 2021-02-28 DIAGNOSIS — F419 Anxiety disorder, unspecified: Secondary | ICD-10-CM | POA: Diagnosis not present

## 2021-02-28 LAB — POCT GLYCOSYLATED HEMOGLOBIN (HGB A1C): Hemoglobin A1C: 5.3 % (ref 4.0–5.6)

## 2021-02-28 MED ORDER — CITALOPRAM HYDROBROMIDE 10 MG PO TABS: 10.0000 mg | ORAL_TABLET | Freq: Every day | ORAL | 3 refills | Status: DC

## 2021-02-28 MED ORDER — BUSPIRONE HCL 7.5 MG PO TABS: 7.5000 mg | ORAL_TABLET | Freq: Two times a day (BID) | ORAL | 3 refills | Status: DC

## 2021-02-28 MED ORDER — ATORVASTATIN CALCIUM 10 MG PO TABS: 10.0000 mg | ORAL_TABLET | Freq: Every day | ORAL | 3 refills | Status: DC

## 2021-02-28 NOTE — Progress Notes (Signed)
Courtney Ayers is a 74 y.o. female who presents for annual wellness visit and follow-up on chronic medical conditions.  She has no particular concerns or complaints.  She did have an elevated blood sugar in the past which will need to be followed up on.  She continues on her allergy and asthma medications and follows up with pulmonary on a regular basis.  She continues on oxygen therapy.  She is doing well on BuSpar and Celexa and would like to continue on that.  She does have a history of osteopenia.  She has been on multivitamins.  Immunizations and Health Maintenance Immunization History  Administered Date(s) Administered   Fluad Quad(high Dose 65+) 02/25/2019, 02/28/2020   Influenza Split 07/03/2011, 02/14/2012   Influenza Whole 05/16/2004   Influenza, High Dose Seasonal PF 03/18/2013, 03/24/2014, 03/08/2015, 03/27/2018   Influenza,inj,Quad PF,6+ Mos 03/21/2016, 05/07/2017   Influenza-Unspecified 03/27/2018   PFIZER(Purple Top)SARS-COV-2 Vaccination 08/03/2019, 08/24/2019, 02/21/2020   PPD Test 05/30/1999   Pneumococcal Conjugate-13 03/24/2014, 02/12/2019   Pneumococcal Polysaccharide-23 08/06/2000, 01/30/2011   Td 09/23/2001   Tdap 01/30/2011   Zoster Recombinat (Shingrix) 12/20/2016, 03/27/2018, 03/27/2018   Zoster, Live 03/05/2011   Health Maintenance Due  Topic Date Due   COVID-19 Vaccine (4 - Booster for Pfizer series) 06/22/2020   INFLUENZA VACCINE  01/08/2021   TETANUS/TDAP  01/29/2021    Last Pap smear: aged out Last mammogram: aged out 08/28/16 Last colonoscopy: 08/29/2011 aged out Last DEXA:07/05/2015; osteopenia noted. Dentist: Q six months  Ophtho: Q year Exercise:N/A  Other doctors caring for patient include:Walsh FNP pulmonary   Advanced directives: Does Patient Have a Medical Advance Directive?: Yes Type of Advance Directive: Living will, Healthcare Power of Attorney, Out of facility DNR (pink MOST or yellow form) Does patient want to make changes to medical  advance directive?: No - Patient declined Copy of [place] in Chart?: Yes - validated most recent copy scanned in chart (See row information) On file Depression screen:  See questionnaire below.  Depression screen [facility] 2/9 02/28/2021 02/28/2020 02/25/2019 09/24/2018 02/23/2018  Decreased Interest 0 0 0 0 0  Down, Depressed, Hopeless 0 0 0 0 0  PHQ - 2 Score 0 0 0 0 0    Fall Risk Screen: see questionnaire below. Fall Risk  02/28/2021 02/28/2020 02/25/2019 09/24/2018 02/23/2018  Falls in the past year? 0 0 1 1 No  Number falls in past yr: 0 - 0 0 -  Injury with Fall? 0 - - 0 -  Comment - - - Bruise -  Risk for fall due to : No Fall Risks - Impaired balance/gait Impaired balance/gait -  Follow up Falls evaluation completed - - Education provided;Falls prevention discussed -    ADL screen:  See questionnaire below Functional Status Survey: Is the patient deaf or have difficulty hearing?: No Does the patient have difficulty seeing, even when wearing glasses/contacts?: No Does the patient have difficulty concentrating, remembering, or making decisions?: No Does the patient have difficulty walking or climbing stairs?: Yes Does the patient have difficulty dressing or bathing?: Yes Does the patient have difficulty doing errands alone such as visiting a doctor's office or shopping?: No   Review of Systems Constitutional: -, -unexpected weight change, -anorexia, -fatigue Allergy: -sneezing, -itching, -congestion Dermatology: denies changing moles, rash, lumps ENT: -runny nose, -ear pain, -sore throat,  Cardiology:  -chest pain, -palpitations, -orthopnea, Respiratory: -cough, -shortness of breath, -dyspnea on exertion, -wheezing,  Gastroenterology: -abdominal pain, -nausea, -vomiting, -diarrhea, -constipation, -dysphagia Hematology: -bleeding  or bruising problems Musculoskeletal: -arthralgias, -myalgias, -joint swelling, -back pain, - Ophthalmology: -vision changes,  Urology:  -dysuria, -difficulty urinating,  -urinary frequency, -urgency, incontinence Neurology: -, -numbness, , -memory loss, -falls, -dizziness    PHYSICAL EXAM:  BP 130/82   Pulse 81   Temp (!) 97.4 F (36.3 C)   Ht 5' 7.5" (1.715 m)   Wt 154 lb (69.9 kg)   LMP 06/10/1992   SpO2 97%   BMI 23.76 kg/m   General Appearance: Alert, cooperative, no distress, appears stated age Head: Normocephalic, without obvious abnormality, atraumatic Eyes: PERRL, conjunctiva/corneas clear, EOM's intact, fundi benign Ears: Normal TM's and external ear canals Nose: Nares normal, mucosa normal, no drainage or sinus tenderness Throat: Lips, mucosa, and tongue normal; teeth and gums normal Neck: Supple, no lymphadenopathy;  thyroid:  no enlargement/tenderness/nodules; no carotid bruit or JVD Lungs: Clear to auscultation bilaterally without wheezes, rales or ronchi; respirations unlabored Heart: Regular rate and rhythm, S1 and S2 normal, no murmur, rubor gallop Abdomen: Soft, non-tender, nondistended, normoactive bowel sounds,  no masses, no hepatosplenomegaly Extremities: No clubbing, cyanosis or edema Pulses: 2+ and symmetric all extremities Skin:  Skin color, texture, turgor normal, no rashes or lesions Lymph nodes: Cervical, supraclavicular, and axillary nodes normal Neurologic:  CNII-XII intact, normal strength, sensation and gait; reflexes 2+ and symmetric throughout Psych: Normal mood, affect, hygiene and grooming. A1c is 5.3 ASSESSMENT/PLAN: Chronic respiratory failure with hypoxia (HCC)  Atherosclerosis of aorta (HCC) - Plan: atorvastatin (LIPITOR) 10 MG tablet  History of colonic polyps  Osteopenia, unspecified location - Plan: CBC with Differential/Platelet, Comprehensive metabolic panel, DG Bone Density  Anxiety state - Plan: citalopram (CELEXA) 10 MG tablet, busPIRone (BUSPAR) 7.5 MG tablet  Anxiety  Hyperglycemia - Plan: POCT glycosylated hemoglobin (Hb A1C)  History of  melanoma  Impaired fasting glucose - Plan: POCT glycosylated hemoglobin (Hb A1C)  Need for COVID-19 vaccine - Plan: Pension scheme manager  Screening for lipid disorders - Plan: Lipid panel  Estrogen deficiency - Plan: DG Bone Density Encouraged her to call dermatology for recheck on melanoma.   Discussed  at least 10 minutes of aerobic activity at least 5 days/week and weight-bearing exercise 2x/week as tolerated depending upon her oxygen level.;  healthy diet, including goals of calcium and vitamin D intake and alcohol recommendations (less than or equal to 1 drink/day) reviewed; regular seatbelt use; changing batteries in smoke detectors.  Immunization recommendations discussed.    Medicare Attestation I have personally reviewed: The patient's medical and social history Their use of alcohol, tobacco or illicit drugs Their current medications and supplements The patient's functional ability including ADLs,fall risks, home safety risks, cognitive, and hearing and visual impairment Diet and physical activities Evidence for depression or mood disorders  The patient's weight, height, and BMI have been recorded in the chart.  I have made referrals, counseling, and provided education to the patient based on review of the above and I have provided the patient with a written personalized care plan for preventive services.     Jill Alexanders, MD   02/28/2021

## 2021-02-28 NOTE — Patient Instructions (Signed)
  Ms. Rieke , Thank you for taking time to come for your Medicare Wellness Visit. I appreciate your ongoing commitment to your health goals. Please review the following plan we discussed and let me know if I can assist you in the future.   These are the goals we discussed:  Goals   None     This is a list of the screening recommended for you and due dates:  Health Maintenance  Topic Date Due   COVID-19 Vaccine (4 - Booster for Pfizer series) 06/22/2020   Flu Shot  01/08/2021   Tetanus Vaccine  01/29/2021   DEXA scan (bone density measurement)  Completed   Hepatitis C Screening: USPSTF Recommendation to screen - Ages 65-79 yo.  Completed   Zoster (Shingles) Vaccine  Completed   HPV Vaccine  Aged Out   Colon Cancer Screening  Discontinued

## 2021-03-01 LAB — CBC WITH DIFFERENTIAL/PLATELET
Basophils Absolute: 0 10*3/uL (ref 0.0–0.2)
Basos: 0 %
EOS (ABSOLUTE): 0 10*3/uL (ref 0.0–0.4)
Eos: 0 %
Hematocrit: 40.6 % (ref 34.0–46.6)
Hemoglobin: 13.7 g/dL (ref 11.1–15.9)
Immature Grans (Abs): 0 10*3/uL (ref 0.0–0.1)
Immature Granulocytes: 0 %
Lymphocytes Absolute: 1.2 10*3/uL (ref 0.7–3.1)
Lymphs: 21 %
MCH: 31.4 pg (ref 26.6–33.0)
MCHC: 33.7 g/dL (ref 31.5–35.7)
MCV: 93 fL (ref 79–97)
Monocytes Absolute: 0.4 10*3/uL (ref 0.1–0.9)
Monocytes: 7 %
Neutrophils Absolute: 4.1 10*3/uL (ref 1.4–7.0)
Neutrophils: 72 %
Platelets: 207 10*3/uL (ref 150–450)
RBC: 4.37 x10E6/uL (ref 3.77–5.28)
RDW: 12.4 % (ref 11.7–15.4)
WBC: 5.7 10*3/uL (ref 3.4–10.8)

## 2021-03-01 LAB — COMPREHENSIVE METABOLIC PANEL
ALT: 19 IU/L (ref 0–32)
AST: 20 IU/L (ref 0–40)
Albumin/Globulin Ratio: 2.5 — ABNORMAL HIGH (ref 1.2–2.2)
Albumin: 4.5 g/dL (ref 3.7–4.7)
Alkaline Phosphatase: 113 IU/L (ref 44–121)
BUN/Creatinine Ratio: 14 (ref 12–28)
BUN: 10 mg/dL (ref 8–27)
Bilirubin Total: 0.4 mg/dL (ref 0.0–1.2)
CO2: 28 mmol/L (ref 20–29)
Calcium: 9.1 mg/dL (ref 8.7–10.3)
Chloride: 104 mmol/L (ref 96–106)
Creatinine, Ser: 0.72 mg/dL (ref 0.57–1.00)
Globulin, Total: 1.8 g/dL (ref 1.5–4.5)
Glucose: 94 mg/dL (ref 65–99)
Potassium: 4.5 mmol/L (ref 3.5–5.2)
Sodium: 145 mmol/L — ABNORMAL HIGH (ref 134–144)
Total Protein: 6.3 g/dL (ref 6.0–8.5)
eGFR: 87 mL/min/{1.73_m2} (ref >59)

## 2021-03-01 LAB — LIPID PANEL
Chol/HDL Ratio: 1.8 ratio (ref 0.0–4.4)
Cholesterol, Total: 147 mg/dL (ref 100–199)
HDL: 84 mg/dL (ref >39)
LDL Chol Calc (NIH): 46 mg/dL (ref 0–99)
Triglycerides: 95 mg/dL (ref 0–149)
VLDL Cholesterol Cal: 17 mg/dL (ref 5–40)

## 2021-03-06 ENCOUNTER — Telehealth: Payer: Self-pay | Admitting: Primary Care

## 2021-03-07 MED ORDER — ALBUTEROL SULFATE HFA 108 (90 BASE) MCG/ACT IN AERS: 2.0000 | INHALATION_SPRAY | Freq: Four times a day (QID) | RESPIRATORY_TRACT | 3 refills | Status: DC | PRN

## 2021-03-07 NOTE — Telephone Encounter (Signed)
I called and spoke with patient regarding message. Patient is needing a script faxed to [facility] online. I have printed out and faxed script to them. Nothing further needed.

## 2021-03-12 ENCOUNTER — Telehealth: Payer: Self-pay | Admitting: Family Medicine

## 2021-03-12 NOTE — Progress Notes (Signed)
  Chronic Care Management   Outreach Note  03/12/2021 Name: Courtney Ayers MRN: [id number] DOB: 11/13/1945  Referred by: [name], MD Reason for referral : No chief complaint on file.   An unsuccessful telephone outreach was attempted today. The patient was referred to the pharmacist for assistance with care management and care coordination.   Follow Up Plan:   Tatjana Dellinger Upstream Scheduler

## 2021-03-19 ENCOUNTER — Telehealth: Payer: Self-pay | Admitting: Family Medicine

## 2021-03-19 NOTE — Chronic Care Management (AMB) (Signed)
  Chronic Care Management   Note  03/19/2021 Name: DIXIE COPPA MRN: [id number] DOB: [birth year]  [name] is a 75 y.o. year old female who is a primary care patient of [name], MD. I reached out to Lenon Oms by phone today in response to a referral sent by Ms. Vicki Mallet PCP, [name], MD.   Ms. Vizcarrondo was given information about Chronic Care Management services today including:  CCM service includes personalized support from designated clinical staff supervised by her physician, including individualized plan of care and coordination with other care providers 24/7 contact phone numbers for assistance for urgent and routine care needs. Service will only be billed when office clinical staff spend 20 minutes or more in a month to coordinate care. Only one practitioner may furnish and bill the service in a calendar month. The patient may stop CCM services at any time (effective at the end of the month) by phone call to the office staff.   Patient agreed to services and verbal consent obtained.   Follow up plan:   Tatjana Secretary/administrator

## 2021-04-11 ENCOUNTER — Other Ambulatory Visit: Payer: Medicare Other

## 2021-04-11 ENCOUNTER — Other Ambulatory Visit: Payer: Self-pay

## 2021-04-11 DIAGNOSIS — Z23 Encounter for immunization: Secondary | ICD-10-CM

## 2021-04-26 ENCOUNTER — Other Ambulatory Visit: Payer: Self-pay | Admitting: Primary Care

## 2021-05-11 ENCOUNTER — Telehealth: Payer: Self-pay | Admitting: Pharmacist

## 2021-05-11 NOTE — Chronic Care Management (AMB) (Signed)
Chronic Care Management Pharmacy Assistant   Name: Courtney Ayers  MRN: [id number] DOB: [birth year]   Reason for Encounter: Chart Review for Initial Encounter with Courtney Ayers Clinical Pharmacist on 05/17/21 at 11 am via phone call.     Conditions to be addressed/monitored: HTN, COPD, Anxiety, and Osteopenia   Recent office visits:  02/28/21 Courtney Lung, MD - Patient presented for Chronic Respiratory failure with hypoxia and other concerns. Changed Albuterol to Nebulizer vials PRN and Aers Inhaler 2 puffs PRN. Changed Buspirone 7.5 mg to 2 times a day orally.   Recent consult visits:  None  Hospital visits:  None in previous 6 months  Medications: Outpatient Encounter Medications as of 05/11/2021  Medication Sig   albuterol (PROVENTIL) (2.5 MG/3ML) 0.083% nebulizer solution USE 1 VIAL VIA NEBULIZER EVERY 2 HOURS AS NEEDED FOR SHORTNESS OF BREATH   albuterol (VENTOLIN HFA) 108 (90 Base) MCG/ACT inhaler Inhale 2 puffs into the lungs every 6 (six) hours as needed for wheezing or shortness of breath.   ALPRAZolam (XANAX) 0.5 MG tablet Take 0.5 mg by mouth at bedtime as needed for anxiety.   atorvastatin (LIPITOR) 10 MG tablet Take 1 tablet (10 mg total) by mouth daily.   BREZTRI AEROSPHERE 160-9-4.8 MCG/ACT AERO INHALE 2 PUFFS INTO THE LUNGS IN THE MORNING AND AT BEDTIME   Budeson-Glycopyrrol-Formoterol (BREZTRI AEROSPHERE) 160-9-4.8 MCG/ACT AERO Inhale 2 puffs into the lungs in the morning and at bedtime.   busPIRone (BUSPAR) 7.5 MG tablet Take 1 tablet (7.5 mg total) by mouth 2 (two) times daily.   Calcium Carbonate-Vitamin D (CALCIUM-VITAMIN D) 500-200 MG-UNIT tablet Take 1 tablet by mouth daily.   citalopram (CELEXA) 10 MG tablet Take 1 tablet (10 mg total) by mouth daily.   fluticasone (FLONASE) 50 MCG/ACT nasal spray Place 2 sprays into both nostrils daily.   Fluticasone-Salmeterol (ADVAIR DISKUS) 250-50 MCG/DOSE AEPB INHALE 1 PUFF INTO THE LUNGS EVERY 12 HOURS. (Patient  not taking: Reported on 02/28/2021)   guaiFENesin (MUCINEX) 600 MG 12 hr tablet Take 1,200 mg by mouth 2 (two) times daily.    montelukast (SINGULAIR) 10 MG tablet TAKE 1 TABLET BY MOUTH EVERYDAY AT BEDTIME   Multiple Vitamin (MULTI VITAMIN DAILY PO) Take by mouth.   OXYGEN Inhale 2 L into the lungs.   sodium chloride HYPERTONIC 3 % nebulizer solution Take by nebulization 2 (two) times daily as needed for other.   Spacer/Aero-Holding Chambers DEVI 1 Dose by Does not apply route 2 (two) times daily.   umeclidinium bromide (INCRUSE ELLIPTA) 62.5 MCG/INH AEPB TAKE 1 PUFF BY MOUTH EVERY DAY (Patient not taking: Reported on 02/28/2021)   No facility-administered encounter medications on file as of 05/11/2021.  Fill History : ALBUTEROL    AER HFA 07/24/2019 30   ALPRAZOLAM 0.5 MG TABLET 09/03/2018 3   BREZTRI AEROSPHERE INHALER 04/27/2021 30   BUSPIRONE HCL 7.5 MG TABLET 07/22/2020 90   CITALOPRAM HBR 10 MG TABLET 03/21/2021 90   ADVAIR 250-50 DISKUS 06/26/2020 90   MONTELUKAST SOD 10 MG TABLET 02/23/2021 90   sodium chloride 3 % for nebulization 10/18/2020 30   OPTICHAMBER DIAMOND VHC 09/05/2020 30   INCRUSE ELLIPTA 62.5 MCG INH 08/30/2020 90   Have you seen any other providers since your last visit? Patient reports none  Any changes in your medications or health? Patient reports no   Any side effects from any medications? Patient reports none she has been on her medications for many years.  Do you have  an symptoms or problems not managed by your medications? Patient reports none  Any concerns about your health right now? Patient reports none  Has your provider asked that you check blood pressure, blood sugar, or follow special diet at home? Patient reports she cooks all of her meals herself, does her breathing exercises 3 times a day and she checks her blood pressures at night.  Do you get any type of exercise on a regular basis? Patient reports she does the housework and cooking,  not using any assisted devices for  mobility.  Can you think of a goal you would like to reach for your health? Patient reports not at this time.  Do you have any problems getting your medications? Patient reports she uses [facility] that is in network with her insurance, they do offer delivery but she does not trust mail order. She reports her inhalers can be expensive when she is in the donut hole.   Is there anything that you would like to discuss during the appointment? Patient reports not at this time.  Patient assistance for the following mediations ; None currently, she reports she was denied several years ago due to her income for her inhalers.  Patient aware  she will receive a reminder call the day prior and will be advised to have available blood pressure readings, and medications.    Care Gaps: BP- 130/82 (02/28/21) AWV- 9/23 Flu Vaccine - Overdue TDAP - Overdue  Star Rating Drugs: None   Patient Assistance: None at this time per pt  [place] Pharmacist Assistant [phone]

## 2021-05-16 ENCOUNTER — Telehealth: Payer: Self-pay | Admitting: Pharmacist

## 2021-05-16 ENCOUNTER — Ambulatory Visit (INDEPENDENT_AMBULATORY_CARE_PROVIDER_SITE_OTHER): Payer: Medicare Other | Admitting: Family Medicine

## 2021-05-16 ENCOUNTER — Encounter: Payer: Self-pay | Admitting: Family Medicine

## 2021-05-16 VITALS — Temp 99.9°F | Wt 154.0 lb

## 2021-05-16 DIAGNOSIS — K529 Noninfective gastroenteritis and colitis, unspecified: Secondary | ICD-10-CM

## 2021-05-16 DIAGNOSIS — J441 Chronic obstructive pulmonary disease with (acute) exacerbation: Secondary | ICD-10-CM

## 2021-05-16 DIAGNOSIS — J9611 Chronic respiratory failure with hypoxia: Secondary | ICD-10-CM | POA: Diagnosis not present

## 2021-05-16 MED ORDER — ONDANSETRON HCL 4 MG PO TABS: 4.0000 mg | ORAL_TABLET | Freq: Three times a day (TID) | ORAL | 0 refills | Status: DC | PRN

## 2021-05-16 MED ORDER — DOXYCYCLINE HYCLATE 100 MG PO TABS: 100.0000 mg | ORAL_TABLET | Freq: Two times a day (BID) | ORAL | 0 refills | Status: DC

## 2021-05-16 NOTE — Progress Notes (Signed)
   Subjective:    Patient ID: Courtney Ayers, female    DOB: [birth year], 75 y.o.   MRN: [id number]  HPI Documentation for virtual audio and video telecommunications through [place] encounter: The patient was located at home. 2 patient identifiers used.  The provider was located in the office. The patient did consent to this visit and is aware of possible charges through their insurance for this visit. The other persons participating in this telemedicine service were none. Time spent on call was 7 minutes and in review of previous records >18 minutes total for counseling and coordination of care. This virtual service is not related to other E/M service within previous 7 days.  She states that she woke up this morning with trouble with vomiting, diarrhea, increasing cough that has now become productive.  She continues on her oxygen.  She is having some nasal congestion and has been using Flonase.  Review of Systems     Objective:   Physical Exam Alert with nasal cannula in place.  Puffed breathing is noted but her lips appear pinkish.  She does not appear tachypneic.       Assessment & Plan:  Acute exacerbation of chronic obstructive pulmonary disease (COPD) ([place]) - Plan: doxycycline (VIBRA-TABS) 100 MG tablet  Chronic respiratory failure with hypoxia ([facility])  Acute gastroenteritis - Plan: ondansetron (ZOFRAN) 4 MG tablet She will continue on her oxygen.  Did recommend judicious use of Afrin to help breathe through her nose especially at night.  Warned that she can only do this for a couple of days.  Continue on Flonase.  Use the Zofran as needed for the nausea.  Hopefully the doxycycline will help with the productive cough.

## 2021-05-16 NOTE — Chronic Care Management (AMB) (Signed)
    Chronic Care Management Pharmacy Assistant   Name: OBDULIA STEIER  MRN: [id number] DOB: 15-Sep-1945  05/16/21 APPOINTMENT REMINDER   Called Patient No answer, left message of appointment on 05/17/21 at 11 via telephone visit with [name], Pharm D.   Notified to have all medications, supplements, blood pressure and/or blood sugar logs available during appointment and to return call if need to reschedule.   Care Gaps: BP- 130/82 (02/28/21) AWV- 9/23 Flu Vaccine - Overdue TDAP - Overdue  Star Rating Drug: None  Any gaps in medications fill history?  None    Medications: Outpatient Encounter Medications as of 05/16/2021  Medication Sig   albuterol (PROVENTIL) (2.5 MG/3ML) 0.083% nebulizer solution USE 1 VIAL VIA NEBULIZER EVERY 2 HOURS AS NEEDED FOR SHORTNESS OF BREATH   albuterol (VENTOLIN HFA) 108 (90 Base) MCG/ACT inhaler Inhale 2 puffs into the lungs every 6 (six) hours as needed for wheezing or shortness of breath.   ALPRAZolam (XANAX) 0.5 MG tablet Take 0.5 mg by mouth at bedtime as needed for anxiety.   atorvastatin (LIPITOR) 10 MG tablet Take 1 tablet (10 mg total) by mouth daily.   BREZTRI AEROSPHERE 160-9-4.8 MCG/ACT AERO INHALE 2 PUFFS INTO THE LUNGS IN THE MORNING AND AT BEDTIME   Budeson-Glycopyrrol-Formoterol (BREZTRI AEROSPHERE) 160-9-4.8 MCG/ACT AERO Inhale 2 puffs into the lungs in the morning and at bedtime.   busPIRone (BUSPAR) 7.5 MG tablet Take 1 tablet (7.5 mg total) by mouth 2 (two) times daily.   Calcium Carbonate-Vitamin D (CALCIUM-VITAMIN D) 500-200 MG-UNIT tablet Take 1 tablet by mouth daily.   citalopram (CELEXA) 10 MG tablet Take 1 tablet (10 mg total) by mouth daily.   fluticasone (FLONASE) 50 MCG/ACT nasal spray Place 2 sprays into both nostrils daily.   Fluticasone-Salmeterol (ADVAIR DISKUS) 250-50 MCG/DOSE AEPB INHALE 1 PUFF INTO THE LUNGS EVERY 12 HOURS. (Patient not taking: Reported on 02/28/2021)   guaiFENesin (MUCINEX) 600 MG 12 hr  tablet Take 1,200 mg by mouth 2 (two) times daily.    montelukast (SINGULAIR) 10 MG tablet TAKE 1 TABLET BY MOUTH EVERYDAY AT BEDTIME   Multiple Vitamin (MULTI VITAMIN DAILY PO) Take by mouth.   OXYGEN Inhale 2 L into the lungs.   sodium chloride HYPERTONIC 3 % nebulizer solution Take by nebulization 2 (two) times daily as needed for other.   Spacer/Aero-Holding Chambers DEVI 1 Dose by Does not apply route 2 (two) times daily.   umeclidinium bromide (INCRUSE ELLIPTA) 62.5 MCG/INH AEPB TAKE 1 PUFF BY MOUTH EVERY DAY (Patient not taking: Reported on 02/28/2021)   No facility-administered encounter medications on file as of 05/16/2021.       [place] Clinical Pharmacist Assistant [phone]

## 2021-05-17 ENCOUNTER — Ambulatory Visit (INDEPENDENT_AMBULATORY_CARE_PROVIDER_SITE_OTHER): Payer: Medicare Other | Admitting: Pharmacist

## 2021-05-17 ENCOUNTER — Other Ambulatory Visit: Payer: Self-pay

## 2021-05-17 DIAGNOSIS — F411 Generalized anxiety disorder: Secondary | ICD-10-CM

## 2021-05-17 DIAGNOSIS — J449 Chronic obstructive pulmonary disease, unspecified: Secondary | ICD-10-CM

## 2021-05-17 NOTE — Progress Notes (Signed)
Chronic Care Management Pharmacy Note  05/24/2021 Name:  Courtney Ayers MRN:  [id number] DOB:  [birth year]  Summary: Pt is having a lot more SOB lately LDL is at goal < 70  Recommendations/Changes made from today's visit: -Recommended taking calcium-vitamin D supplement 1 daily with multivitamin -Consider allergy testing -Applied for PAP for Breztri  Plan: Follow up with PAP submission COPD assessment in 2 months   Subjective: Courtney Ayers is an 75 y.o. year old female who is a primary patient of [name], MD.  The CCM team was consulted for assistance with disease management and care coordination needs.    Engaged with patient by telephone for initial visit in response to provider referral for pharmacy case management and/or care coordination services.   Consent to Services:  The patient was given the following information about Chronic Care Management services today, agreed to services, and gave verbal consent: 1. CCM service includes personalized support from designated clinical staff supervised by the primary care provider, including individualized plan of care and coordination with other care providers 2. 24/7 contact phone numbers for assistance for urgent and routine care needs. 3. Service will only be billed when office clinical staff spend 20 minutes or more in a month to coordinate care. 4. Only one practitioner may furnish and bill the service in a calendar month. 5.The patient may stop CCM services at any time (effective at the end of the month) by phone call to the office staff. 6. The patient will be responsible for cost sharing (co-pay) of up to 20% of the service fee (after annual deductible is met). Patient agreed to services and consent obtained.  Patient Care Team: [name], MD as PCP - General (Family Medicine) Viona Gilmore, [facility] as Pharmacist (Pharmacist)  Recent office visits: 05/16/21 Jill Alexanders, MD: Patient presented for respiratory  distress and diarrhea. Prescribed doxycycline and Zofran.  02/28/21 [name], MD - Patient presented for Chronic Respiratory failure with hypoxia and other concerns. Changed Albuterol to Nebulizer vials PRN and Aers Inhaler 2 puffs PRN. Changed Buspirone 7.5 mg to 2 times a day orally.   Recent consult visits: None  Hospital visits: None in previous 6 months   Objective:  Lab Results  Component Value Date   CREATININE 0.72 02/28/2021   BUN 10 02/28/2021   GFRNONAA 75 02/28/2020   GFRAA 87 02/28/2020   NA 145 (H) 02/28/2021   K 4.5 02/28/2021   CALCIUM 9.1 02/28/2021   CO2 28 02/28/2021   GLUCOSE 94 02/28/2021    Lab Results  Component Value Date/Time   HGBA1C 5.3 02/28/2021 04:59 PM   HGBA1C 5.3 02/28/2020 03:07 PM   HGBA1C 5.1 07/01/2016 02:04 PM    Last diabetic Eye exam: No results found for: HMDIABEYEEXA  Last diabetic Foot exam: No results found for: HMDIABFOOTEX   Lab Results  Component Value Date   CHOL 147 02/28/2021   HDL 84 02/28/2021   LDLCALC 46 02/28/2021   TRIG 95 02/28/2021   CHOLHDL 1.8 02/28/2021    Hepatic Function Latest Ref Rng & Units 02/28/2021 02/28/2020 02/25/2019  Total Protein 6.0 - 8.5 g/dL 6.3 6.3 6.1  Albumin 3.7 - 4.7 g/dL 4.5 4.4 4.3  AST 0 - 40 IU/L _0 ALT 0 - 32 IU/L _1 Alk Phosphatase 44 - 121 IU/L 113 109 87  Total Bilirubin 0.0 - 1.2 mg/dL 0.4 0.3 0.3    Lab Results  Component Value  Date/Time   TSH 1.488 08/08/2014 11:40 AM   TSH 1.970 12/03/2012 11:18 AM   FREET4 1.06 12/03/2012 11:18 AM    CBC Latest Ref Rng & Units 02/28/2021 02/28/2020 02/25/2019  WBC 3.4 - 10.8 x10E3/uL 5.7 6.6 6.5  Hemoglobin 11.1 - 15.9 g/dL 13.7 13.2 13.1  Hematocrit 34.0 - 46.6 % 40.6 40.2 39.5  Platelets 150 - 450 x10E3/uL 207 216 206    Lab Results  Component Value Date/Time   VD25OH 42 01/30/2011 11:08 AM    Clinical ASCVD: No  The 10-year ASCVD risk score (Arnett DK, et al., 2019) is: 18.4%   Values used to  calculate the score:     Age: 45 years     Sex: Female     Is Non-Hispanic African American: No     Diabetic: No     Tobacco smoker: No     Systolic Blood Pressure: 578 mmHg     Is BP treated: No     HDL Cholesterol: 84 mg/dL     Total Cholesterol: 147 mg/dL    Depression screen [facility] 2/9 02/28/2021 02/28/2020 02/25/2019  Decreased Interest 0 0 0  Down, Depressed, Hopeless 0 0 0  PHQ - 2 Score 0 0 0    No flowsheet data found.  No flowsheet data found.   Social History   Tobacco Use  Smoking Status Former   Packs/day: 1.00   Years: 45.00   Pack years: 45.00   Types: Cigarettes   Quit date: 08/05/2014   Years since quitting: 6.8  Smokeless Tobacco Never   BP Readings from Last 3 Encounters:  02/28/21 130/82  09/05/20 108/68  02/28/20 104/68   Pulse Readings from Last 3 Encounters:  02/28/21 81  09/05/20 87  02/28/20 92   Wt Readings from Last 3 Encounters:  05/16/21 154 lb (69.9 kg)  02/28/21 154 lb (69.9 kg)  09/05/20 156 lb 12.8 oz (71.1 kg)   BMI Readings from Last 3 Encounters:  05/16/21 23.76 kg/m  02/28/21 23.76 kg/m  09/05/20 24.20 kg/m    Assessment/Interventions: Review of patient past medical history, allergies, medications, health status, including review of consultants reports, laboratory and other test data, was performed as part of comprehensive evaluation and provision of chronic care management services.   SDOH:  (Social Determinants of Health) assessments and interventions performed: Yes SDOH Interventions    Flowsheet Row Most Recent Value  SDOH Interventions   Financial Strain Interventions Intervention Not Indicated  Transportation Interventions Intervention Not Indicated      SDOH Screenings   Alcohol Screen: Not on file  Depression (PHQ2-9): Low Risk    PHQ-2 Score: 0  Financial Resource Strain: Low Risk    Difficulty of Paying Living Expenses: Not hard at all  Food Insecurity: Not on file  Housing: Not on file  Physical  Activity: Not on file  Social Connections: Not on file  Stress: Not on file  Tobacco Use: Medium Risk   Smoking Tobacco Use: Former   Smokeless Tobacco Use: Never   Passive Exposure: Not on file  Transportation Needs: No Transportation Needs   Lack of Transportation (Medical): No   Lack of Transportation (Non-Medical): No   Patient is out of breath a lot. She usually wakes up between 8:15-8:30 and feeds her cats and dogs and then feeds herself. She then drinks coffee and reads the news paper and stories on the Internet. She then eats lunch and checks her emails and pays bills. In the afternoon, her sister  usually comes to visit and they do scratch offs, they will drink a cocktail and then watches something on the Internet with her. She usually reads for 20 minutes and then goes to bed each night.   Patient's sister has been living with her for a year and still has her own house as well. She has been trying to move everything and possibly sell it.  Patient sometimes have ansty feet before going to bed and may be neuropathy. Patient does get up to go to the bathroom during the night. Patient feels rested usually and does not normally take naps.  Patient is out of breath at rest right now and only when moving around. Patient does breathing exercises 3 times a day. Patient would be able to walk from one end of house to another but that is about it. She can do limited housework with breathing issues.  Patient is having some issues with cost of medications, particularly with her inhalers as she enters the donut hole.   CCM Care Plan  No Known Allergies  Medications Reviewed Today     Reviewed by Viona Gilmore, [facility] (Pharmacist) on 05/17/21 at 1128  Med List Status: <None>   Medication Order Taking? Sig Documenting Provider Last Dose Status Informant  albuterol (PROVENTIL) (2.5 MG/3ML) 0.083% nebulizer solution [id number] Yes USE 1 VIAL VIA NEBULIZER EVERY 2 HOURS AS NEEDED FOR SHORTNESS OF  BREATH Lauraine Rinne, NP Taking Active   albuterol (VENTOLIN HFA) 108 (90 Base) MCG/ACT inhaler [id number] Yes Inhale 2 puffs into the lungs every 6 (six) hours as needed for wheezing or shortness of breath. Martyn Ehrich, NP Taking Active   ALPRAZolam Duanne Moron) 0.5 MG tablet [id number]  Take 0.5 mg by mouth at bedtime as needed for anxiety.  Patient not taking: Reported on 05/16/2021   [provider]  Active Self  atorvastatin (LIPITOR) 10 MG tablet [id number]  Take 1 tablet (10 mg total) by mouth daily. [name], MD  Active   BREZTRI AEROSPHERE 160-9-4.8 MCG/ACT AERO [id number] Yes INHALE 2 PUFFS INTO THE LUNGS IN THE MORNING AND AT BEDTIME Martyn Ehrich, NP Taking Active   busPIRone (BUSPAR) 7.5 MG tablet [id number]  Take 1 tablet (7.5 mg total) by mouth 2 (two) times daily. [name], MD  Active   Calcium Carbonate-Vitamin D (CALCIUM-VITAMIN D) 500-200 MG-UNIT tablet [id number]  Take 1 tablet by mouth daily.  Patient not taking: Reported on 05/16/2021   [provider]  Active   citalopram (CELEXA) 10 MG tablet [id number]  Take 1 tablet (10 mg total) by mouth daily. [name], MD  Active   doxycycline (VIBRA-TABS) 100 MG tablet [id number]  Take 1 tablet (100 mg total) by mouth 2 (two) times daily. [name], MD  Active   fluticasone [facility]) 50 MCG/ACT nasal spray [id number]  Place 2 sprays into both nostrils daily. Donita Brooks, NP  Expired 05/16/21 2359 Self  guaiFENesin (MUCINEX) 600 MG 12 hr tablet [id number]  Take 1,200 mg by mouth 2 (two) times daily.  [provider]  Active Self  montelukast (SINGULAIR) 10 MG tablet [id number]  TAKE 1 TABLET BY MOUTH EVERYDAY AT BEDTIME Julian Hy, DO  Active   Multiple Vitamin (MULTI VITAMIN DAILY PO) [id number]  Take by mouth. [provider]  Active   ondansetron (ZOFRAN) 4 MG tablet [id number]  Take 1 tablet (4 mg total) by mouth every 8 (eight) hours as needed for nausea or vomiting.  [employer],  Elyse Jarvis, MD  Active   OXYGEN [id number]  Inhale 2 L into the lungs. [provider]  Active Self  sodium chloride HYPERTONIC 3 % nebulizer solution [id number]  Take by nebulization 2 (two) times daily as needed for other. Julian Hy, DO  Active   Spacer/Aero-Holding Chambers DEVI [id number]  1 Dose by Does not apply route 2 (two) times daily. Martyn Ehrich, NP  Active             Patient Active Problem List   Diagnosis Date Noted   Atherosclerosis of aorta ([place]) 02/25/2019   History of melanoma 02/25/2019   Constipation 08/31/2018   Palliative care by specialist    DNR (do not resuscitate)    Debility 08/26/2018   HTN (hypertension) 08/11/2018   COPD (chronic obstructive pulmonary disease) ([place]) 06/04/2016   Prolonged Q-T interval on ECG 06/04/2016   Hyperglycemia 06/04/2016   Chronic respiratory failure with hypoxia ([place]) 07/20/2015   Multiple pulmonary nodules 08/26/2014   History of colonic polyps 08/29/2011   Diverticulosis of colon (without mention of hemorrhage) 08/29/2011   Osteopenia 01/30/2011   Anxiety 01/30/2011   Dysthymia 01/30/2011   Allergic rhinitis 01/30/2011    Immunization History  Administered Date(s) Administered   Fluad Quad(high Dose 65+) 02/25/2019, 02/28/2020, 04/11/2021   Influenza Split 07/03/2011, 02/14/2012   Influenza Whole 05/16/2004   Influenza, High Dose Seasonal PF 03/18/2013, 03/24/2014, 03/08/2015, 03/27/2018   Influenza,inj,Quad PF,6+ Mos 03/21/2016, 05/07/2017   Influenza-Unspecified 03/27/2018   PFIZER(Purple Top)SARS-COV-2 Vaccination 08/03/2019, 08/24/2019, 02/21/2020   PPD Test 05/30/1999   Pfizer Covid-19 Vaccine Bivalent Booster 58yr & up 02/28/2021   Pneumococcal Conjugate-13 03/24/2014, 02/12/2019   Pneumococcal Polysaccharide-23 08/06/2000, 01/30/2011   Td 09/23/2001   Tdap 01/30/2011   Zoster Recombinat (Shingrix) 12/20/2016, 03/27/2018, 03/27/2018   Zoster, Live 03/05/2011    Conditions to be  addressed/monitored:  Hypertension, Hyperlipidemia, COPD, Anxiety, Osteopenia, and Allergic Rhinitis  Care Plan : CE[place] Updates made by PViona Gilmore RTaylorssince 05/24/2021 12:00 AM     Problem: Problem: Hypertension, Hyperlipidemia, COPD, Anxiety, Osteopenia, and Allergic Rhinitis      Long-Range Goal: Patient-Specific Goal   Start Date: 05/17/2021  Expected End Date: 05/17/2022  This Visit's Progress: On track  Priority: High  Note:   Current Barriers:  Unable to independently afford treatment regimen Unable to maintain control of COPD  Pharmacist Clinical Goal(s):  Patient will verbalize ability to afford treatment regimen achieve adherence to monitoring guidelines and medication adherence to achieve therapeutic efficacy through collaboration with PharmD and provider.   Interventions: 1:1 collaboration with LDenita Lung MD regarding development and update of comprehensive plan of care as evidenced by provider attestation and co-signature Inter-disciplinary care team collaboration (see longitudinal plan of care) Comprehensive medication review performed; medication list updated in electronic medical record  Hypertension (BP goal <140/90) -Controlled -Current treatment: No medications -Medications previously tried: unknown  -Current home readings: higher yesterday with sickness/anxiety (137/81, pulse 103) arm cuff -Current dietary habits: eating regularly 3 meals a day -Current exercise habits: limited with shortness of breath -Denies hypotensive/hypertensive symptoms -Educated on Importance of home blood pressure monitoring; Proper BP monitoring technique; Symptoms of hypotension and importance of maintaining adequate hydration; -Counseled to monitor BP at home when symptomatic, document, and provide log at future appointments -Counseled on diet and exercise extensively  Hyperlipidemia/Atherosclerosis of aorta: (LDL goal <  70) -Controlled -Current treatment: Atorvastatin 10 mg 1 tablet daily -Medications previously tried: none  -Current  dietary patterns: eating regularly -Current exercise habits: limited with shortness of breath -Educated on Cholesterol goals;  Benefits of statin for ASCVD risk reduction; -Recommended to continue current medication  COPD (Goal: control symptoms and prevent exacerbations) -Not ideally controlled -Current treatment  Breztri 160-9-4.8 mcg/act 2 puffs twice daily Albuterol nebulizer as needed Albuterol HFA as needed -Medications previously tried: unknown  -Gold Grade: Gold 4 (FEV1<40%) -Current COPD Classification:  D (high sx, >/=2 exacerbations/yr) -MMRC/CAT score: n/a -Pulmonary function testing: 2016 -Exacerbations requiring treatment in last 6 months: yes -Patient reports consistent use of maintenance inhaler -Frequency of rescue inhaler use: twice daily (uses nebulizer more than inhaler) -Counseled on Proper inhaler technique; Benefits of consistent maintenance inhaler use When to use rescue inhaler Differences between maintenance and rescue inhalers -Counseled on diet and exercise extensively Recommended to continue current medication Assessed patient finances. Applied for Breztri PAP.  Depression/Anxiety (Goal: minimize symptoms) -Controlled -Current treatment: Citalopram 10 mg 1 tablet daily Buspirone 7.5 mg 1 tablet twice daily Alprazolam 0.5 mg at bedtime as needed (not taking consistently) -Medications previously tried/failed: n/a -PHQ9: 0 -GAD7: n/a -Educated on Benefits of medication for symptom control Benefits of cognitive-behavioral therapy with or without medication -Recommended to continue current medication  Osteopenia (Goal prevent fractures) -Not ideally controlled -Last DEXA Scan: 2017   T-Score femoral neck: -1.9  T-Score total hip: n/a  T-Score lumbar spine: -2.3  T-Score forearm radius: n/a  10-year probability of major  osteoporotic fracture: 16.%  10-year probability of hip fracture: 3.2% -Patient is a candidate for pharmacologic treatment due to T-Score -1.0 to -2.5 and 10-year risk of hip fracture > 3% -Current treatment  Multivitamin daily (800 units of vitamin D, 100 mg of calcium) Calcium & vitamin D [phone] units 2 tablets daily -Medications previously tried: none  -Recommend [phone] units of vitamin D daily. Recommend 1200 mg of calcium daily from dietary and supplemental sources. Recommend weight-bearing and muscle strengthening exercises for building and maintaining bone density. -Recommended repeat DEXA.  Allergic rhinitis (Goal: minimize symptoms) -Controlled -Current treatment  Montelukast 10 mg 1 tablet daily at bedtime Flonase 2 sprays in both nostrils daily Guaifenesin 1200 mg twice daily  -Medications previously tried: none  -Recommended to continue current medication Consider allergy testing.  Health Maintenance -Vaccine gaps: tetanus -Current therapy:  Mulitivitamin not taking often -Educated on Cost vs benefit of each product must be carefully weighed by individual consumer -Patient is satisfied with current therapy and denies issues -Recommended to continue current medication  Patient Goals/Self-Care Activities Patient will:  - check blood pressure when symptomatic, document, and provide at future appointments collaborate with provider on medication access solutions  Follow Up Plan: The care management team will reach out to the patient again over the next 30 days.       Follow Up Plan: The care management team will reach out to the patient again over the next 30 days.    Medication Assistance: Application for Breztri  medication assistance program. in process.  Anticipated assistance start date 06/17/21.  See plan of care for additional detail.  Compliance/Adherence/Medication fill history: Care Gaps: Influenza vaccine (she got it - in car), tetanus BP- 130/82  (02/28/21)  Star-Rating Drugs: Atorvastatin 10 mg - last [facility]/14/21 [facility]  Patient's preferred pharmacy is:  [id number] [place], [place] - [id number]: [phone]: [address]  [place] [address] Phone: [phone] Fax: [place], [place] - [address] [place] [address] Phone: [phone] Fax: [phone]  Uses pill box? No - keep them in bottom drawer of bathroom Pt endorses 99% compliance - in bathroom; nobody showers in there  We discussed: Current pharmacy is preferred with insurance plan and patient is satisfied with pharmacy services Patient decided to: Continue current medication management strategy  Care Plan and Follow Up Patient Decision:  Patient agrees to Care Plan and Follow-up.  Plan: The care management team will reach out to the patient again over the next 30 days.  [name], PharmD, [facility] [phone]

## 2021-05-23 ENCOUNTER — Telehealth: Payer: Self-pay | Admitting: Pharmacist

## 2021-05-23 ENCOUNTER — Other Ambulatory Visit: Payer: Self-pay | Admitting: Critical Care Medicine

## 2021-05-23 NOTE — Progress Notes (Addendum)
Call to [facility] and Me Per [name] to check on the status of Breztri application. Was advised that the application was denied due to exceeding the income criteria and that patient may appeal this decision by submitting her most recent tax returns.   Confirmed the requirements are 300% of FPL limit Was advised that her income put her at 316% they could not give a dollar amount  Pharmacist advised  Patient Assistance: Judithann Sauger- 2022 denied  [place] Pharmacist Assistant [phone]

## 2021-05-24 NOTE — Patient Instructions (Addendum)
Hi Courtney Ayers,  It was great to get to meet you over the telephone! Below is a summary of some of the topics we discussed.   As far as the calcium and vitamin D supplements I would take the calcium-vitamin D combo you are on right now once daily and the multivitamin. You shouldn't need additional vitamin D supplementation.  I will let you know what I can find out from the [facility] patient assistance company.  Please reach out to me if you have any questions or need anything!  Best, Maddie  [name], PharmD, [place] [phone]   Visit Information   Goals Addressed   None    Patient Care Plan: CCM Pharmacy Care Plan     Problem Identified: Problem: Hypertension, Hyperlipidemia, COPD, Anxiety, Osteopenia, and Allergic Rhinitis      Long-Range Goal: Patient-Specific Goal   Start Date: 05/17/2021  Expected End Date: 05/17/2022  This Visit's Progress: On track  Priority: High  Note:   Current Barriers:  Unable to independently afford treatment regimen Unable to maintain control of COPD  Pharmacist Clinical Goal(s):  Patient will verbalize ability to afford treatment regimen achieve adherence to monitoring guidelines and medication adherence to achieve therapeutic efficacy through collaboration with PharmD and provider.   Interventions: 1:1 collaboration with [name], MD regarding development and update of comprehensive plan of care as evidenced by provider attestation and co-signature Inter-disciplinary care team collaboration (see longitudinal plan of care) Comprehensive medication review performed; medication list updated in electronic medical record  Hypertension (BP goal <140/90) -Controlled -Current treatment: No medications -Medications previously tried: unknown  -Current home readings: higher yesterday with sickness/anxiety (137/81, pulse 103) arm cuff -Current dietary habits: eating regularly 3 meals a  day -Current exercise habits: limited with shortness of breath -Denies hypotensive/hypertensive symptoms -Educated on Importance of home blood pressure monitoring; Proper BP monitoring technique; Symptoms of hypotension and importance of maintaining adequate hydration; -Counseled to monitor BP at home when symptomatic, document, and provide log at future appointments -Counseled on diet and exercise extensively  Hyperlipidemia/Atherosclerosis of aorta: (LDL goal < 70) -Controlled -Current treatment: Atorvastatin 10 mg 1 tablet daily -Medications previously tried: none  -Current dietary patterns: eating regularly -Current exercise habits: limited with shortness of breath -Educated on Cholesterol goals;  Benefits of statin for ASCVD risk reduction; -Recommended to continue current medication  COPD (Goal: control symptoms and prevent exacerbations) -Not ideally controlled -Current treatment  Breztri 160-9-4.8 mcg/act 2 puffs twice daily Albuterol nebulizer as needed Albuterol HFA as needed -Medications previously tried: unknown  -Gold Grade: Gold 4 (FEV1<40%) -Current COPD Classification:  D (high sx, >/=2 exacerbations/yr) -MMRC/CAT score: n/a -Pulmonary function testing: 2016 -Exacerbations requiring treatment in last 6 months: yes -Patient reports consistent use of maintenance inhaler -Frequency of rescue inhaler use: twice daily (uses nebulizer more than inhaler) -Counseled on Proper inhaler technique; Benefits of consistent maintenance inhaler use When to use rescue inhaler Differences between maintenance and rescue inhalers -Counseled on diet and exercise extensively Recommended to continue current medication Assessed patient finances. Applied for Breztri PAP.  Depression/Anxiety (Goal: minimize symptoms) -Controlled -Current treatment: Citalopram 10 mg 1 tablet daily Buspirone 7.5 mg 1 tablet twice daily Alprazolam 0.5 mg at bedtime as needed (not taking  consistently) -Medications previously tried/failed: n/a -PHQ9: 0 -GAD7: n/a -Educated on Benefits of medication for symptom control Benefits of cognitive-behavioral therapy with or without medication -Recommended to continue current medication  Osteopenia (Goal prevent fractures) -Not ideally controlled -Last DEXA  Scan: 2017   T-Score femoral neck: -1.9  T-Score total hip: n/a  T-Score lumbar spine: -2.3  T-Score forearm radius: n/a  10-year probability of major osteoporotic fracture: 16.%  10-year probability of hip fracture: 3.2% -Patient is a candidate for pharmacologic treatment due to T-Score -1.0 to -2.5 and 10-year risk of hip fracture > 3% -Current treatment  Multivitamin daily (800 units of vitamin D, 100 mg of calcium) Calcium & vitamin D [phone] units 2 tablets daily -Medications previously tried: none  -Recommend [phone] units of vitamin D daily. Recommend 1200 mg of calcium daily from dietary and supplemental sources. Recommend weight-bearing and muscle strengthening exercises for building and maintaining bone density. -Recommended repeat DEXA.  Allergic rhinitis (Goal: minimize symptoms) -Controlled -Current treatment  Montelukast 10 mg 1 tablet daily at bedtime Flonase 2 sprays in both nostrils daily Guaifenesin 1200 mg twice daily  -Medications previously tried: none  -Recommended to continue current medication Consider allergy testing.  Health Maintenance -Vaccine gaps: tetanus -Current therapy:  Mulitivitamin not taking often -Educated on Cost vs benefit of each product must be carefully weighed by individual consumer -Patient is satisfied with current therapy and denies issues -Recommended to continue current medication  Patient Goals/Self-Care Activities Patient will:  - check blood pressure when symptomatic, document, and provide at future appointments collaborate with provider on medication access solutions  Follow Up Plan: The care management  team will reach out to the patient again over the next 30 days.       Courtney Ayers was given information about Chronic Care Management services today including:  CCM service includes personalized support from designated clinical staff supervised by her physician, including individualized plan of care and coordination with other care providers 24/7 contact phone numbers for assistance for urgent and routine care needs. Standard insurance, coinsurance, copays and deductibles apply for chronic care management only during months in which we provide at least 20 minutes of these services. Most insurances cover these services at 100%, however patients may be responsible for any copay, coinsurance and/or deductible if applicable. This service may help you avoid the need for more expensive face-to-face services. Only one practitioner may furnish and bill the service in a calendar month. The patient may stop CCM services at any time (effective at the end of the month) by phone call to the office staff.  Patient agreed to services and verbal consent obtained.   Patient verbalizes understanding of instructions provided today and agrees to view in [place].  The pharmacy team will reach out to the patient again over the next 30 days.   Viona Gilmore, [facility]

## 2021-06-06 ENCOUNTER — Telehealth: Payer: Self-pay | Admitting: Primary Care

## 2021-06-06 MED ORDER — BREZTRI AEROSPHERE 160-9-4.8 MCG/ACT IN AERO: 2.0000 | INHALATION_SPRAY | Freq: Two times a day (BID) | RESPIRATORY_TRACT | 0 refills | Status: DC

## 2021-06-06 NOTE — Telephone Encounter (Signed)
Called and spoke with the pt  She is asking for breztri sample to get her through until 2023  I left her one up front  I have scheduled her appt with Dr Erin Fulling for 07/05/21  Nothing further needed

## 2021-06-09 DIAGNOSIS — J449 Chronic obstructive pulmonary disease, unspecified: Secondary | ICD-10-CM

## 2021-07-05 ENCOUNTER — Other Ambulatory Visit: Payer: Self-pay

## 2021-07-05 ENCOUNTER — Encounter: Payer: Self-pay | Admitting: Pulmonary Disease

## 2021-07-05 ENCOUNTER — Ambulatory Visit (INDEPENDENT_AMBULATORY_CARE_PROVIDER_SITE_OTHER): Payer: Medicare Other | Admitting: Pulmonary Disease

## 2021-07-05 VITALS — BP 128/84 | HR 78 | Ht 67.5 in | Wt 155.2 lb

## 2021-07-05 DIAGNOSIS — J449 Chronic obstructive pulmonary disease, unspecified: Secondary | ICD-10-CM | POA: Diagnosis not present

## 2021-07-05 DIAGNOSIS — J9611 Chronic respiratory failure with hypoxia: Secondary | ICD-10-CM | POA: Diagnosis not present

## 2021-07-05 MED ORDER — BREZTRI AEROSPHERE 160-9-4.8 MCG/ACT IN AERO: 2.0000 | INHALATION_SPRAY | Freq: Two times a day (BID) | RESPIRATORY_TRACT | 11 refills | Status: DC

## 2021-07-05 NOTE — Progress Notes (Signed)
Synopsis: Referred in January 2023 for COPD, former patient of Dr. Carlis Abbott  Subjective:   PATIENT ID: Courtney Ayers GENDER: female DOB: [birth year], MRN: [id number]   HPI  Chief Complaint  Patient presents with   Establish Care    COPD   Courtney Ayers is a 76 year old woman, former smoker with COPD and chronic hypoxemic respiratory failure who returns to pulmonary clinic today to establish care with a new provider.   She was last seen 09/05/2020 by Derl Barrow, NP. She was started on breztri 2 puffs twice daily at that time with improvement in her breathing. She continues to use 2L of supplemental oxygen. She is able to carry out her activities of daily living with some dyspnea. She has edema of her left lower extremity that occurs during the day but resolves overnight after sleeping in bed. She had a gastrointestinal illness followed by a sinus/respiratory infection in December which she has recovered well from.   She is accompanied by her sister today.  Past Medical History:  Diagnosis Date   Adenomatous colon polyp    Allergic rhinitis, cause unspecified    seasonal   COPD (chronic obstructive pulmonary disease) (HCC)    no home O2   Depressive disorder, not elsewhere classified 2006   some anxiety recurs when she has stopped med in past   OA (osteoarthritis)    B THUMB, RIGHT > LEFT HIP, BACK   Osteopenia      Family History  Problem Relation Age of Onset   Cancer Mother        bladder, metastatic   Alcohol abuse Father    Heart disease Father        CHF   Hyperlipidemia Father    Hypertension Father    Aortic aneurysm Sister    Crohn's disease Sister    Hyperlipidemia Sister    Cancer Sister        brain cancer in her 53's   Diabetes Maternal Uncle    Cancer Maternal Grandmother 98       pancreatic   Cancer Maternal Grandfather        bladder cancer in 36's   Stroke Paternal Grandmother    Heart disease Paternal Grandfather        MI later 51's, early  89's     Social History   Socioeconomic History   Marital status: Single    Spouse name: Not on file   Number of children: Not on file   Years of education: Not on file   Highest education level: Not on file  Occupational History   Not on file  Tobacco Use   Smoking status: Former    Packs/day: 1.00    Years: 45.00    Pack years: 45.00    Types: Cigarettes    Quit date: 08/05/2014    Years since quitting: 6.9   Smokeless tobacco: Never  Vaping Use   Vaping Use: Never used  Substance and Sexual Activity   Alcohol use: Yes    Alcohol/week: 0.0 standard drinks    Comment: 2 drinks per night   Drug use: No   Sexual activity: Not Currently    Partners: Female  Other Topics Concern   Not on file  Social History Narrative   Lives with other members of the family, 5 cat in the home, 2 dog outside   Social Determinants of Health   Financial Resource Strain: Low Risk    Difficulty of Paying Living Expenses:  Not hard at all  Food Insecurity: Not on file  Transportation Needs: No Transportation Needs   Lack of Transportation (Medical): No   Lack of Transportation (Non-Medical): No  Physical Activity: Not on file  Stress: Not on file  Social Connections: Not on file  Intimate Partner Violence: Not on file     No Known Allergies   Outpatient Medications Prior to Visit  Medication Sig Dispense Refill   albuterol (PROVENTIL) (2.5 MG/3ML) 0.083% nebulizer solution USE 1 VIAL VIA NEBULIZER EVERY 2 HOURS AS NEEDED FOR SHORTNESS OF BREATH 375 mL 5   albuterol (VENTOLIN HFA) 108 (90 Base) MCG/ACT inhaler Inhale 2 puffs into the lungs every 6 (six) hours as needed for wheezing or shortness of breath. 18 g 3   ALPRAZolam (XANAX) 0.5 MG tablet Take 0.5 mg by mouth at bedtime as needed for anxiety.     atorvastatin (LIPITOR) 10 MG tablet Take 1 tablet (10 mg total) by mouth daily. 90 tablet 3   BREZTRI AEROSPHERE 160-9-4.8 MCG/ACT AERO INHALE 2 PUFFS INTO THE LUNGS IN THE MORNING AND  AT BEDTIME 10.7 g 3   busPIRone (BUSPAR) 7.5 MG tablet Take 1 tablet (7.5 mg total) by mouth 2 (two) times daily. 180 tablet 3   Calcium Carbonate-Vitamin D (CALCIUM-VITAMIN D) 500-200 MG-UNIT tablet Take 1 tablet by mouth daily.     citalopram (CELEXA) 10 MG tablet Take 1 tablet (10 mg total) by mouth daily. 90 tablet 3   Guaifenesin 1200 MG TB12 Take 1,200 mg by mouth 2 (two) times daily.      montelukast (SINGULAIR) 10 MG tablet TAKE 1 TABLET BY MOUTH EVERYDAY AT BEDTIME 90 tablet 3   ondansetron (ZOFRAN) 4 MG tablet Take 1 tablet (4 mg total) by mouth every 8 (eight) hours as needed for nausea or vomiting. 20 tablet 0   OXYGEN Inhale 2 L into the lungs.     sodium chloride HYPERTONIC 3 % nebulizer solution Take by nebulization 2 (two) times daily as needed for other. 360 mL 11   Spacer/Aero-Holding Chambers DEVI 1 Dose by Does not apply route 2 (two) times daily. 1 Dose 0   fluticasone (FLONASE) 50 MCG/ACT nasal spray Place 2 sprays into both nostrils daily. 16 g 5   Budeson-Glycopyrrol-Formoterol (BREZTRI AEROSPHERE) 160-9-4.8 MCG/ACT AERO Inhale 2 puffs into the lungs in the morning and at bedtime. 5.9 g 0   doxycycline (VIBRA-TABS) 100 MG tablet Take 1 tablet (100 mg total) by mouth 2 (two) times daily. 28 tablet 0   No facility-administered medications prior to visit.    Review of Systems  Constitutional:  Negative for chills, fever, malaise/fatigue and weight loss.  HENT:  Negative for congestion, sinus pain and sore throat.   Eyes: Negative.   Respiratory:  Positive for shortness of breath. Negative for cough, hemoptysis, sputum production and wheezing.   Cardiovascular:  Negative for chest pain, palpitations, orthopnea, claudication and leg swelling.  Gastrointestinal:  Negative for abdominal pain, heartburn, nausea and vomiting.  Genitourinary: Negative.   Musculoskeletal:  Negative for joint pain and myalgias.  Skin:  Negative for rash.  Neurological:  Negative for weakness.   Endo/Heme/Allergies: Negative.   Psychiatric/Behavioral: Negative.     Objective:   Vitals:   07/05/21 1427  BP: 128/84  Pulse: 78  SpO2: 100%  Weight: 155 lb 3.2 oz (70.4 kg)  Height: 5' 7.5" (1.715 m)    Physical Exam Constitutional:      General: She is not in acute distress.  Appearance: She is not ill-appearing.     Comments: thin  HENT:     Head: Normocephalic and atraumatic.  Eyes:     General: No scleral icterus.    Conjunctiva/sclera: Conjunctivae normal.     Pupils: Pupils are equal, round, and reactive to light.  Cardiovascular:     Rate and Rhythm: Normal rate and regular rhythm.     Pulses: Normal pulses.     Heart sounds: Normal heart sounds. No murmur heard. Pulmonary:     Effort: Pulmonary effort is normal.     Breath sounds: Normal breath sounds. Decreased air movement present. No wheezing, rhonchi or rales.  Abdominal:     General: Bowel sounds are normal.     Palpations: Abdomen is soft.  Musculoskeletal:     Right lower leg: No edema.     Left lower leg: No edema.  Lymphadenopathy:     Cervical: No cervical adenopathy.  Skin:    General: Skin is warm and dry.  Neurological:     General: No focal deficit present.     Mental Status: She is alert.  Psychiatric:        Mood and Affect: Mood normal.        Behavior: Behavior normal.        Thought Content: Thought content normal.        Judgment: Judgment normal.    CBC    Component Value Date/Time   WBC 5.7 02/28/2021 0000   WBC 9.3 09/03/2018 0440   RBC 4.37 02/28/2021 0000   RBC 3.46 (L) 09/03/2018 0440   HGB 13.7 02/28/2021 0000   HCT 40.6 02/28/2021 0000   PLT 207 02/28/2021 0000   MCV 93 02/28/2021 0000   MCH 31.4 02/28/2021 0000   MCH 30.3 09/03/2018 0440   MCHC 33.7 02/28/2021 0000   MCHC 29.7 (L) 09/03/2018 0440   RDW 12.4 02/28/2021 0000   LYMPHSABS 1.2 02/28/2021 0000   MONOABS 0.6 09/02/2018 0508   EOSABS 0.0 02/28/2021 0000   BASOSABS 0.0 02/28/2021 0000   BMP  Latest Ref Rng & Units 02/28/2021 02/28/2020 02/25/2019  Glucose 65 - 99 mg/dL 94 119(H) 99  BUN 8 - 27 mg/dL 10 15 15   Creatinine 0.57 - 1.00 mg/dL 0.72 0.78 0.75  BUN/Creat Ratio 12 - 28 14 19 20   Sodium 134 - 144 mmol/L 145(H) 145(H) 144  Potassium 3.5 - 5.2 mmol/L 4.5 4.4 5.1  Chloride 96 - 106 mmol/L 104 103 106  CO2 20 - 29 mmol/L 28 30(H) 28  Calcium 8.7 - 10.3 mg/dL 9.1 9.5 9.6   Chest imaging: CXR 09/05/20 The heart size and mediastinal contours are within normal limits. No acute pulmonary disease is noted. Emphysematous disease is noted in the upper lobes bilaterally with associated scarring. Hyperexpansion of the lungs is noted. No pneumothorax or pleural effusion is noted. The visualized skeletal structures are unremarkable.  PFT: PFT Results Latest Ref Rng & Units 10/07/2014  FVC-Pre L 2.36  FVC-Predicted Pre % 67  FVC-Post L 2.51  FVC-Predicted Post % 72  Pre FEV1/FVC % % 33  Post FEV1/FCV % % 33  FEV1-Pre L 0.77  FEV1-Predicted Pre % 29  FEV1-Post L 0.82  DLCO uncorrected ml/min/mmHg 7.55  DLCO [facility]C% % 25  DLVA Predicted % 26  TLC L 7.07  TLC % Predicted % 125  RV % Predicted % 180  Severe obstruction.   Labs:  Path:  Echo 2016: LV EF 60-65%. Grade I diastolic dysfunction. RV  size mildly dilated with normal function.  Heart Catheterization:  Assessment & Plan:   Chronic obstructive pulmonary disease, unspecified COPD type ([place])  Chronic respiratory failure with hypoxia ([place])  Discussion: Courtney Ayers is a 76 year old woman, former smoker with COPD and chronic hypoxemic respiratory failure who returns to pulmonary clinic today to establish care with a new provider.   She is to continue on breztri inhaler therapy for her COPD. She is to continue on supplemental oxygen. We discussed lung cancer screening and she does not wish to participate in that program which is in line with prior discussions with other providers. She inquired about having a portable  oxygen concentrator for a back up incase she runs out of power. We discussed the pros/cons of this as a back up option. I suggested she look into a back up potable generator that she could use for her current oxygen concentrator in case of a power outage.   She is to follow up in 6 months.   Freda Jackson, MD [facility] Office: [phone]    Current Outpatient Medications:    albuterol (PROVENTIL) (2.5 MG/3ML) 0.083% nebulizer solution, USE 1 VIAL VIA NEBULIZER EVERY 2 HOURS AS NEEDED FOR SHORTNESS OF BREATH, Disp: 375 mL, Rfl: 5   albuterol (VENTOLIN HFA) 108 (90 Base) MCG/ACT inhaler, Inhale 2 puffs into the lungs every 6 (six) hours as needed for wheezing or shortness of breath., Disp: 18 g, Rfl: 3   ALPRAZolam (XANAX) 0.5 MG tablet, Take 0.5 mg by mouth at bedtime as needed for anxiety., Disp: , Rfl:    atorvastatin (LIPITOR) 10 MG tablet, Take 1 tablet (10 mg total) by mouth daily., Disp: 90 tablet, Rfl: 3   BREZTRI AEROSPHERE 160-9-4.8 MCG/ACT AERO, INHALE 2 PUFFS INTO THE LUNGS IN THE MORNING AND AT BEDTIME, Disp: 10.7 g, Rfl: 3   busPIRone (BUSPAR) 7.5 MG tablet, Take 1 tablet (7.5 mg total) by mouth 2 (two) times daily., Disp: 180 tablet, Rfl: 3   Calcium Carbonate-Vitamin D (CALCIUM-VITAMIN D) 500-200 MG-UNIT tablet, Take 1 tablet by mouth daily., Disp: , Rfl:    citalopram (CELEXA) 10 MG tablet, Take 1 tablet (10 mg total) by mouth daily., Disp: 90 tablet, Rfl: 3   Guaifenesin 1200 MG TB12, Take 1,200 mg by mouth 2 (two) times daily. , Disp: , Rfl:    montelukast (SINGULAIR) 10 MG tablet, TAKE 1 TABLET BY MOUTH EVERYDAY AT BEDTIME, Disp: 90 tablet, Rfl: 3   ondansetron (ZOFRAN) 4 MG tablet, Take 1 tablet (4 mg total) by mouth every 8 (eight) hours as needed for nausea or vomiting., Disp: 20 tablet, Rfl: 0   OXYGEN, Inhale 2 L into the lungs., Disp: , Rfl:    sodium chloride HYPERTONIC 3 % nebulizer solution, Take by nebulization 2 (two) times daily as  needed for other., Disp: 360 mL, Rfl: 11   Spacer/Aero-Holding Chambers DEVI, 1 Dose by Does not apply route 2 (two) times daily., Disp: 1 Dose, Rfl: 0   fluticasone (FLONASE) 50 MCG/ACT nasal spray, Place 2 sprays into both nostrils daily., Disp: 16 g, Rfl: 5

## 2021-07-05 NOTE — Patient Instructions (Addendum)
Continue breztri inhaler 2 puffs twice daily  We will order a humidified setup to add to your home oxygen  I would look into a portable generator that you can hook your home concentrator to during power outages.  Follow up in 6 months

## 2021-07-28 ENCOUNTER — Other Ambulatory Visit: Payer: Self-pay | Admitting: Family Medicine

## 2021-07-28 MED ORDER — NIRMATRELVIR/RITONAVIR (PAXLOVID)TABLET: 3.0000 | ORAL_TABLET | Freq: Two times a day (BID) | ORAL | 0 refills | Status: AC

## 2021-07-28 NOTE — Progress Notes (Signed)
She has underlying COPD and on O2, just tested for Covid and was +ibuprofen will call in Paxlovid

## 2021-07-31 ENCOUNTER — Telehealth: Payer: Self-pay | Admitting: Pulmonary Disease

## 2021-07-31 NOTE — Telephone Encounter (Signed)
I called and spoke with the pt and notified of response per Dr Halford Chessman. She verbalized understanding. Nothing further needed.

## 2021-07-31 NOTE — Telephone Encounter (Signed)
Spoke with the pt  She states woke up in the night 07/27/21 with increased SOB- took covid test at home and it was positive  She has had all of the available covid vaccines She says she called her PCP and he sent her in Paxlovid and with finish this after tomorrow  She states that her breathing has improved, but not back to baseline and she has some nasal/chest congestion  She feels tired  She has not had any wheezing, fevers, aches  She is on Breztri, mucinex 1200 bid, saline NS, saline nebs 2-3 x per day andalbuterol nebs  She is asking if there is anything else she could take  Please advise thanks!  No Known Allergies

## 2021-07-31 NOTE — Telephone Encounter (Signed)
Called patient but she did not answer. Left message for her to call back.  

## 2021-07-31 NOTE — Telephone Encounter (Signed)
Sounds like she is getting better.  Full recovery from COVID infection can take several weeks.  I don't think she needs additional therapies at this time.

## 2021-08-15 ENCOUNTER — Ambulatory Visit
Admission: RE | Admit: 2021-08-15 | Discharge: 2021-08-15 | Disposition: A | Discharge status: Home / Self Care | Payer: Medicare Other | Source: Ambulatory Visit | Attending: Family Medicine | Admitting: Family Medicine

## 2021-08-15 DIAGNOSIS — M8588 Other specified disorders of bone density and structure, other site: Secondary | ICD-10-CM | POA: Diagnosis not present

## 2021-08-15 DIAGNOSIS — M810 Age-related osteoporosis without current pathological fracture: Secondary | ICD-10-CM | POA: Diagnosis not present

## 2021-08-15 DIAGNOSIS — Z20822 Contact with and (suspected) exposure to covid-19: Secondary | ICD-10-CM | POA: Diagnosis not present

## 2021-08-15 DIAGNOSIS — Z780 Asymptomatic menopausal state: Secondary | ICD-10-CM | POA: Diagnosis not present

## 2021-08-17 MED ORDER — IBANDRONATE SODIUM 150 MG PO TABS: 150.0000 mg | ORAL_TABLET | ORAL | 3 refills | Status: DC

## 2021-08-17 NOTE — Addendum Note (Signed)
Addended by: [name] on: 08/17/2021 01:07 PM   Modules accepted: Orders

## 2021-09-22 DIAGNOSIS — Z20822 Contact with and (suspected) exposure to covid-19: Secondary | ICD-10-CM | POA: Diagnosis not present

## 2021-10-06 DIAGNOSIS — Z20822 Contact with and (suspected) exposure to covid-19: Secondary | ICD-10-CM | POA: Diagnosis not present

## 2021-10-10 DIAGNOSIS — Z20822 Contact with and (suspected) exposure to covid-19: Secondary | ICD-10-CM | POA: Diagnosis not present

## 2021-10-15 DIAGNOSIS — Z20822 Contact with and (suspected) exposure to covid-19: Secondary | ICD-10-CM | POA: Diagnosis not present

## 2021-10-17 DIAGNOSIS — Z20828 Contact with and (suspected) exposure to other viral communicable diseases: Secondary | ICD-10-CM | POA: Diagnosis not present

## 2021-10-17 DIAGNOSIS — Z1152 Encounter for screening for COVID-19: Secondary | ICD-10-CM | POA: Diagnosis not present

## 2022-02-12 ENCOUNTER — Telehealth: Payer: Self-pay | Admitting: Pharmacist

## 2022-02-12 NOTE — Chronic Care Management (AMB) (Signed)
Chronic Care Management Pharmacy Assistant   Name: Courtney Ayers  MRN: [id number] DOB: 13-Jun-1945  Reason for Encounter: Disease State   Conditions to be addressed/monitored: General Assessment  Recent office visits:  None  Recent consult visits:  None  Hospital visits:  None in previous 6 months  Medications: Outpatient Encounter Medications as of 02/12/2022  Medication Sig   albuterol (PROVENTIL) (2.5 MG/3ML) 0.083% nebulizer solution USE 1 VIAL VIA NEBULIZER EVERY 2 HOURS AS NEEDED FOR SHORTNESS OF BREATH   albuterol (VENTOLIN HFA) 108 (90 Base) MCG/ACT inhaler Inhale 2 puffs into the lungs every 6 (six) hours as needed for wheezing or shortness of breath.   ALPRAZolam (XANAX) 0.5 MG tablet Take 0.5 mg by mouth at bedtime as needed for anxiety.   atorvastatin (LIPITOR) 10 MG tablet Take 1 tablet (10 mg total) by mouth daily.   Budeson-Glycopyrrol-Formoterol (BREZTRI AEROSPHERE) 160-9-4.8 MCG/ACT AERO Inhale 2 puffs into the lungs in the morning and at bedtime.   busPIRone (BUSPAR) 7.5 MG tablet Take 1 tablet (7.5 mg total) by mouth 2 (two) times daily.   Calcium Carbonate-Vitamin D (CALCIUM-VITAMIN D) 500-200 MG-UNIT tablet Take 1 tablet by mouth daily.   citalopram (CELEXA) 10 MG tablet Take 1 tablet (10 mg total) by mouth daily.   fluticasone (FLONASE) 50 MCG/ACT nasal spray Place 2 sprays into both nostrils daily.   Guaifenesin 1200 MG TB12 Take 1,200 mg by mouth 2 (two) times daily.    ibandronate (BONIVA) 150 MG tablet Take 1 tablet (150 mg total) by mouth every 30 (thirty) days. Take in the morning with a full glass of water, on an empty stomach, and do not take anything else by mouth or lie down for the next 30 min.   montelukast (SINGULAIR) 10 MG tablet TAKE 1 TABLET BY MOUTH EVERYDAY AT BEDTIME   ondansetron (ZOFRAN) 4 MG tablet Take 1 tablet (4 mg total) by mouth every 8 (eight) hours as needed for nausea or vomiting.   OXYGEN Inhale 2 L into the lungs.   sodium  chloride HYPERTONIC 3 % nebulizer solution Take by nebulization 2 (two) times daily as needed for other.   Spacer/Aero-Holding Chambers DEVI 1 Dose by Does not apply route 2 (two) times daily.   No facility-administered encounter medications on file as of 02/12/2022.   [place] for General Review Call  Adherence Review:  Does the Clinical Pharmacist Assistant have access to adherence rates? Yes Adherence rates for STAR metric medications Atorvastatin 10 mg - [facility]05/22 [facility] Atorvastatin 10 mg - Last filled 10/09/21 [facility]    Disease State Questions:  Able to connect with Patient? No Did patient have any problems with their health recently? No  Have you had any admissions or emergency room visits or worsening of your condition(s) since last visit? No  Have you had any visits with new specialists or providers since your last visit? No  Have you had any new health care problem(s) since your last visit? No  Have you run out of any of your medications since you last spoke with clinical pharmacist? No  Are there any medications you are not taking as prescribed? No  Are you having any issues or side effects with your medications? No  Do you have any other health concerns or questions you want to discuss with your Clinical Pharmacist before your next visit? No  Are there any health concerns that you feel we can do  a better job addressing? No  Are you having any problems with any of the following since the last visit: (select all that apply)  None  12. Any falls since last visit? No  Details: 13. Any increased or uncontrolled pain since last visit? No  Details: 14. Next visit Type: office       Visit with: PCP 9/23          15. Additional Details? No    Care Gaps: COVID Booster - Overdue Flu Vaccine - Overdue CCM- Decl prefers follow up with pcp BP- 128/84 07/05/21 AWV- 9/22  Star Rating Drugs: Atorvastatin 10 mg - Last filled  01/05/22 90 DS at [facility] Pharmacist Assistant [phone]

## 2022-02-13 ENCOUNTER — Encounter: Payer: Self-pay | Admitting: Internal Medicine

## 2022-02-18 ENCOUNTER — Telehealth: Payer: Self-pay | Admitting: Pulmonary Disease

## 2022-02-18 NOTE — Telephone Encounter (Signed)
Called and spoke with pt who states 2 days ago she started coughing up some blood and has done it a couple of times. Stated that she also has had some back pain associated.   Stated to pt that we needed to get her in for an appt to further evaluate and she verbalized understanding. Appt scheduled for pt with VS. Nothing further needed.

## 2022-02-19 ENCOUNTER — Encounter: Payer: Self-pay | Admitting: Pulmonary Disease

## 2022-02-19 ENCOUNTER — Ambulatory Visit (INDEPENDENT_AMBULATORY_CARE_PROVIDER_SITE_OTHER): Payer: Medicare Other

## 2022-02-19 ENCOUNTER — Ambulatory Visit (INDEPENDENT_AMBULATORY_CARE_PROVIDER_SITE_OTHER): Payer: Medicare Other | Admitting: Pulmonary Disease

## 2022-02-19 VITALS — BP 132/78 | HR 79 | Ht 67.5 in | Wt 155.8 lb

## 2022-02-19 DIAGNOSIS — J342 Deviated nasal septum: Secondary | ICD-10-CM | POA: Diagnosis not present

## 2022-02-19 DIAGNOSIS — J310 Chronic rhinitis: Secondary | ICD-10-CM | POA: Diagnosis not present

## 2022-02-19 DIAGNOSIS — J9611 Chronic respiratory failure with hypoxia: Secondary | ICD-10-CM | POA: Diagnosis not present

## 2022-02-19 DIAGNOSIS — J449 Chronic obstructive pulmonary disease, unspecified: Secondary | ICD-10-CM | POA: Diagnosis not present

## 2022-02-19 DIAGNOSIS — J439 Emphysema, unspecified: Secondary | ICD-10-CM | POA: Diagnosis not present

## 2022-02-19 DIAGNOSIS — R042 Hemoptysis: Secondary | ICD-10-CM

## 2022-02-19 DIAGNOSIS — T485X5A Adverse effect of other anti-common-cold drugs, initial encounter: Secondary | ICD-10-CM

## 2022-02-19 NOTE — Patient Instructions (Signed)
Chest xray today  Will arrange for referral to pulmonary rehab  Follow up with Dr. Erin Fulling in 4 to 6 weeks

## 2022-02-19 NOTE — Progress Notes (Signed)
Name: Courtney Ayers MRN: [id number] DOB: 1945/10/21  Chief Complaint  Patient presents with   Acute Visit    Coughed up blood clots after falling 2 days ago    Summary: 76 yo female former smoker with COPD and chronic respiratory failure.  She is followed by Dr. Erin Fulling.  Subjective: She presents of an acute visit today to assess hemoptysis.  She was washing her hair under a sink a couple days ago.  She stood up straight quickly and felt light headed.  She fell onto her left side.  She got a cut on her left arm and bruises on both her arms.  She didn't have any bruising on her chest and did not hit her head.  No loss of consciousness.  She had some blood when she blew her nose, but didn't feel any blood drainage down her throat.  She didn't bite her tongue.    The next day she had her usual coughing spell.  She could up a clot of blood.  Later in the day she could up a plug again but was less bloody or more phlegm.  She has not have chest pain, fever, wheezing, or sweats.  She is using 2 liters oxygen 24/7.  Her SpO2 drops below 88% sometimes with walking.  She is not very active at home.  She gets anxious when she has to go out somewhere.  It is difficult to travel with her oxygen tanks.    She uses afrin nightly.  She uses flonase intermittently.  She has to use nose strips to keep her nasal passages open.  Past medical history: She  has a past medical history of Adenomatous colon polyp, Chronic respiratory failure ([place]), Chronic rhinitis, COPD with emphysema ([place]), Depression (2006), Deviated nasal septum, Hyperlipidemia, OA (osteoarthritis), and Osteopenia.  Vital signs: BP 132/78 (BP Location: Right Arm)   Pulse 79   Ht 5' 7.5" (1.715 m)   Wt 155 lb 12.8 oz (70.7 kg)   LMP 06/10/1992   SpO2 99% Comment: 2L  BMI 24.04 kg/m   Physical exam:  General - alert, wearing oxygen, sitting in a wheelchair ENT - no sinus tenderness, deviated septum, wearing breath rite strips, no  stridor Cardiac - regular rate/rhythm, no murmur Chest - decreased breath sounds, no wheezing Abdomen - soft, non tender, + bowel sounds Extremities - 1+ ankle edema Skin - venous stasis changes  Studies: Spirometry 10/07/14 >> FEV1 0.73 (27%), FEV1% 31 CT chest 02/03/18 >> severe centrilobular emphysema, several nodular densities in both lungs up to 3.3 cm ABG 08/28/18 >> pH 7.37, PCO2 64, PO2 77.9  Assessment/plan:  Mild hemoptysis after recent fall. - will get chest xray  Severe COPD with emphysema. - continue breztri - prn albuterol - she would like to have referral placed for pulmonary rehab - discussed importance of RSV and COVID vaccines  Chronic respiratory failure with hypoxia and hypercapnia. - continue 2 to 3 liters oxygen 24/7 - she will d/w Dr. Erin Fulling at next visit about getting a POC - might need consideration for NIV  Chronic rhinitis with nasal septal deviation. Rhinitis medicamentosa. - discussed concerns about chronic afrin use - nasal irrigation - continue flonase   Patient Instructions  Chest xray today  Will arrange for referral to pulmonary rehab  Follow up with Dr. Erin Fulling in 4 to 6 weeks  Medications: Allergies as of 02/19/2022   No Known Allergies      Medication List  Accurate as of February 19, 2022  2:32 PM. If you have any questions, ask your nurse or doctor.          albuterol (2.5 MG/3ML) 0.083% nebulizer solution Commonly known as: PROVENTIL USE 1 VIAL VIA NEBULIZER EVERY 2 HOURS AS NEEDED FOR SHORTNESS OF BREATH   albuterol 108 (90 Base) MCG/ACT inhaler Commonly known as: VENTOLIN HFA Inhale 2 puffs into the lungs every 6 (six) hours as needed for wheezing or shortness of breath.   ALPRAZolam 0.5 MG tablet Commonly known as: XANAX Take 0.5 mg by mouth at bedtime as needed for anxiety.   atorvastatin 10 MG tablet Commonly known as: LIPITOR Take 1 tablet (10 mg total) by mouth daily.   Breztri Aerosphere  160-9-4.8 MCG/ACT Aero Generic drug: Budeson-Glycopyrrol-Formoterol Inhale 2 puffs into the lungs in the morning and at bedtime.   busPIRone 7.5 MG tablet Commonly known as: BUSPAR Take 1 tablet (7.5 mg total) by mouth 2 (two) times daily.   calcium-vitamin D 500-200 MG-UNIT tablet Take 1 tablet by mouth daily.   citalopram 10 MG tablet Commonly known as: CELEXA Take 1 tablet (10 mg total) by mouth daily.   fluticasone 50 MCG/ACT nasal spray Commonly known as: Flonase Place 2 sprays into both nostrils daily.   Guaifenesin 1200 MG Tb12 Take 1,200 mg by mouth 2 (two) times daily.   ibandronate 150 MG tablet Commonly known as: Boniva Take 1 tablet (150 mg total) by mouth every 30 (thirty) days. Take in the morning with a full glass of water, on an empty stomach, and do not take anything else by mouth or lie down for the next 30 min.   montelukast 10 MG tablet Commonly known as: SINGULAIR TAKE 1 TABLET BY MOUTH EVERYDAY AT BEDTIME   ondansetron 4 MG tablet Commonly known as: Zofran Take 1 tablet (4 mg total) by mouth every 8 (eight) hours as needed for nausea or vomiting.   OXYGEN Inhale 2 L into the lungs.   sodium chloride HYPERTONIC 3 % nebulizer solution Take by nebulization 2 (two) times daily as needed for other.   Spacer/Aero-Holding [employer] 1 Dose by Does not apply route 2 (two) times daily.       Time spent: 39 minutes  Signature: Chesley Mires, MD [place] Pager - [phone] 02/19/2022, 2:32 PM

## 2022-02-20 ENCOUNTER — Other Ambulatory Visit: Payer: Self-pay | Admitting: Family Medicine

## 2022-02-20 MED ORDER — ALPRAZOLAM 0.5 MG PO TABS: 0.5000 mg | ORAL_TABLET | Freq: Every evening | ORAL | 1 refills | Status: DC | PRN

## 2022-02-20 NOTE — Telephone Encounter (Signed)
[facility] is requesting to fill pt [facility]. Please advise [facility]

## 2022-02-21 ENCOUNTER — Telehealth: Payer: Self-pay | Admitting: Pulmonary Disease

## 2022-02-21 DIAGNOSIS — R911 Solitary pulmonary nodule: Secondary | ICD-10-CM

## 2022-02-21 NOTE — Telephone Encounter (Signed)
DG Chest 2 View  Result Date: 02/20/2022 CLINICAL DATA:  Hemoptysis. EXAM: CHEST - 2 VIEW COMPARISON:  09/05/2020 and CT 02/03/2018 FINDINGS: Lungs are hyperinflated. Emphysematous changes are identified throughout the lungs bilaterally. Asymmetric density at the RIGHT lung apex is consistent with previously identified nodular opacity. There is new scarring at the LATERAL LEFT lung base. No consolidations or pleural effusions. No pulmonary edema. IMPRESSION: 1. Hyperinflation and emphysematous changes. 2. Asymmetric density in the RIGHT lung apex warranting follow-up CT chest. Electronically Signed   By: Nolon Nations M.D.   On: 02/20/2022 15:45      Spoke with patient about chest xray findings.  Will schedule CT chest without contrast to further evaluate lung nodule.

## 2022-02-26 ENCOUNTER — Other Ambulatory Visit (HOSPITAL_COMMUNITY): Payer: Medicare Other

## 2022-02-26 ENCOUNTER — Encounter: Payer: Self-pay | Admitting: Pulmonary Disease

## 2022-02-26 ENCOUNTER — Telehealth (HOSPITAL_COMMUNITY): Payer: Self-pay

## 2022-02-26 NOTE — Telephone Encounter (Signed)
Pt insurance is active and benefits verified through Medicare A/B. Co-pay $0.00, DED $226.00/$226.00 met, out of pocket $0.00/$0.00 met, co-insurance 20%. No pre-authorization required. 02/26/22 @ 11:49AM   2ndary insurance is active and benefits verified through [place].. Co-pay $0.00, DED $0.00/$0.00 met, out of pocket $0.00/$0.00 met, co-insurance 0%. No pre-authorization required. 02/26/22 @ 11:49AM

## 2022-02-26 NOTE — Telephone Encounter (Signed)
Called patient to see if she is interested in the Pulmonary Rehab Program. Patient expressed interest. Explained scheduling process and went over insurance, patient verbalized understanding. 

## 2022-03-04 ENCOUNTER — Encounter: Payer: Self-pay | Admitting: Family Medicine

## 2022-03-04 ENCOUNTER — Ambulatory Visit (HOSPITAL_COMMUNITY)
Admission: RE | Admit: 2022-03-04 | Discharge: 2022-03-04 | Disposition: A | Discharge status: Home / Self Care | Payer: Medicare Other | Source: Ambulatory Visit | Attending: Pulmonary Disease | Admitting: Pulmonary Disease

## 2022-03-04 ENCOUNTER — Ambulatory Visit (INDEPENDENT_AMBULATORY_CARE_PROVIDER_SITE_OTHER): Payer: Medicare Other | Admitting: Family Medicine

## 2022-03-04 VITALS — BP 124/82 | HR 72 | Temp 98.1°F | Ht 68.0 in | Wt 156.2 lb

## 2022-03-04 DIAGNOSIS — I700 Atherosclerosis of aorta: Secondary | ICD-10-CM

## 2022-03-04 DIAGNOSIS — J449 Chronic obstructive pulmonary disease, unspecified: Secondary | ICD-10-CM

## 2022-03-04 DIAGNOSIS — Z85820 Personal history of malignant melanoma of skin: Secondary | ICD-10-CM | POA: Diagnosis not present

## 2022-03-04 DIAGNOSIS — D692 Other nonthrombocytopenic purpura: Secondary | ICD-10-CM

## 2022-03-04 DIAGNOSIS — R911 Solitary pulmonary nodule: Secondary | ICD-10-CM | POA: Insufficient documentation

## 2022-03-04 DIAGNOSIS — J9611 Chronic respiratory failure with hypoxia: Secondary | ICD-10-CM

## 2022-03-04 DIAGNOSIS — L719 Rosacea, unspecified: Secondary | ICD-10-CM | POA: Diagnosis not present

## 2022-03-04 DIAGNOSIS — F411 Generalized anxiety disorder: Secondary | ICD-10-CM | POA: Diagnosis not present

## 2022-03-04 DIAGNOSIS — J439 Emphysema, unspecified: Secondary | ICD-10-CM | POA: Diagnosis not present

## 2022-03-04 DIAGNOSIS — M8580 Other specified disorders of bone density and structure, unspecified site: Secondary | ICD-10-CM

## 2022-03-04 MED ORDER — BUSPIRONE HCL 7.5 MG PO TABS: 7.5000 mg | ORAL_TABLET | Freq: Two times a day (BID) | ORAL | 3 refills | Status: DC

## 2022-03-04 MED ORDER — TRETINOIN 0.05 % EX CREA: TOPICAL_CREAM | Freq: Every day | CUTANEOUS | 0 refills | Status: AC

## 2022-03-04 MED ORDER — CITALOPRAM HYDROBROMIDE 10 MG PO TABS: 10.0000 mg | ORAL_TABLET | Freq: Every day | ORAL | 3 refills | Status: DC

## 2022-03-04 MED ORDER — ATORVASTATIN CALCIUM 10 MG PO TABS: 10.0000 mg | ORAL_TABLET | Freq: Every day | ORAL | 3 refills | Status: DC

## 2022-03-04 NOTE — Addendum Note (Signed)
Addended by: [name] on: 03/04/2022 05:53 PM   Modules accepted: Orders

## 2022-03-04 NOTE — Progress Notes (Signed)
Annual Wellness Visit     Patient: Courtney Ayers, Female    DOB: 11/28/1945, 76 y.o.   MRN: [id number]  Subjective  Chief Complaint  Patient presents with   Annual Exam    MWV did fall recently has apt this after noon for a CAT scan coughed up blood in phlegm after the fall, had x-ray done showed scaring on low left lung, discomfort in shoulder blade area, also lt. Side rt. Of breast, also has some rosacea around nose, 02/15/22 is when she fell    Courtney Ayers is a 76 y.o. female who presents today for her Annual Wellness Visit. She reports consuming a general diet. The patient does not participate in regular exercise at present. She generally feels fairly well. She reports sleeping well.  He is in the process as listed above for a CT scan due to hemoptysis.  She also has some rosacea on the left side of her nose that she would like evaluated and treated.  Her medications were reviewed.  She seems to doing well on her present medication regimen.  She does have x-ray evidence of aortic atherosclerosis.  Low okay psychologically she seems to be holding her own.  Her sister is her caregiver.  She continues on Boniva.  She does have 3 or 4 alcoholic beverages per night.  This is her standard and she seems to handle this well..  She is followed by pulmonary.  Otherwise she has no concerns or complaints.  She also has concerns about her forearms becoming reddish-purple.  She does have a remote history of melanoma.  HPI       Medications: Outpatient Medications Prior to Visit  Medication Sig   albuterol (PROVENTIL) (2.5 MG/3ML) 0.083% nebulizer solution USE 1 VIAL VIA NEBULIZER EVERY 2 HOURS AS NEEDED FOR SHORTNESS OF BREATH   albuterol (VENTOLIN HFA) 108 (90 Base) MCG/ACT inhaler Inhale 2 puffs into the lungs every 6 (six) hours as needed for wheezing or shortness of breath.   ALPRAZolam (XANAX) 0.5 MG tablet Take 1 tablet (0.5 mg total) by mouth at bedtime as needed for anxiety.    Budeson-Glycopyrrol-Formoterol (BREZTRI AEROSPHERE) 160-9-4.8 MCG/ACT AERO Inhale 2 puffs into the lungs in the morning and at bedtime.   Calcium Carbonate-Vitamin D (CALCIUM-VITAMIN D) 500-200 MG-UNIT tablet Take 1 tablet by mouth daily.   citalopram (CELEXA) 10 MG tablet Take 1 tablet (10 mg total) by mouth daily.   Guaifenesin 1200 MG TB12 Take 1,200 mg by mouth 2 (two) times daily.    ibandronate (BONIVA) 150 MG tablet Take 1 tablet (150 mg total) by mouth every 30 (thirty) days. Take in the morning with a full glass of water, on an empty stomach, and do not take anything else by mouth or lie down for the next 30 min.   montelukast (SINGULAIR) 10 MG tablet TAKE 1 TABLET BY MOUTH EVERYDAY AT BEDTIME   ondansetron (ZOFRAN) 4 MG tablet Take 1 tablet (4 mg total) by mouth every 8 (eight) hours as needed for nausea or vomiting.   OXYGEN Inhale 2 L into the lungs.   sodium chloride HYPERTONIC 3 % nebulizer solution Take by nebulization 2 (two) times daily as needed for other.   Spacer/Aero-Holding Chambers DEVI 1 Dose by Does not apply route 2 (two) times daily.   [DISCONTINUED] atorvastatin (LIPITOR) 10 MG tablet Take 1 tablet (10 mg total) by mouth daily.   [DISCONTINUED] busPIRone (BUSPAR) 7.5 MG tablet Take 1 tablet (7.5 mg total) by mouth  2 (two) times daily.   fluticasone (FLONASE) 50 MCG/ACT nasal spray Place 2 sprays into both nostrils daily.   No facility-administered medications prior to visit.    No Known Allergies  Patient Care Team: [name], MD as PCP - General (Family Medicine) Viona Gilmore, [facility] as Pharmacist (Pharmacist)  Review of Systems  All other systems reviewed and are negative.       Objective  BP 124/82   Pulse 72   Temp 98.1 F (36.7 C)   Ht '5\' 8"'$  (1.727 m)   Wt 156 lb 3.2 oz (70.9 kg)   LMP 06/10/1992   BMI 23.75 kg/m    Physical Exam Alert and in no distress.  Skin to the left side of her nose does show some slight erythema.  Tympanic  membranes and canals are normal. Pharyngeal area is normal. Neck is supple without adenopathy or thyromegaly. Cardiac exam shows a regular sinus rhythm without murmurs or gallops. Lungs are showed distant sounds.  Purpuric lesions noted on her forearms.   Most recent functional status assessment:    03/04/2022    2:06 PM  In your present state of health, do you have any difficulty performing the following activities:  Hearing? 0  Vision? 0  Difficulty concentrating or making decisions? 0  Walking or climbing stairs? 1  Dressing or bathing? 1  Comment a little difficulty  Doing errands, shopping? 0  Preparing Food and eating ? N  Using the Toilet? N  In the past six months, have you accidently leaked urine? N  Do you have problems with loss of bowel control? N  Managing your Medications? N  Managing your Finances? N  Housekeeping or managing your Housekeeping? N   Most recent fall risk assessment:    03/04/2022    2:02 PM  [place] in the past year? 1  Number falls in past yr: 0  Comment did cut arm a little bit  Injury with Fall? 1  Risk for fall due to : History of fall(s)  Follow up Falls evaluation completed    Most recent depression screenings:    03/04/2022    2:03 PM 02/28/2021    2:32 PM  PHQ 2/9 Scores  PHQ - 2 Score 0 0   Most recent cognitive screening:     No data to display         Most recent Audit-C alcohol use screening    02/25/2022   11:22 AM  Alcohol Use Disorder Test (AUDIT)  1. How often do you have a drink containing alcohol? 4  2. How many drinks containing alcohol do you have on a typical day when you are drinking? 0  3. How often do you have six or more drinks on one occasion? 0  AUDIT-C Score 4   A score of 3 or more in women, and 4 or more in men indicates increased risk for alcohol abuse, EXCEPT if all of the points are from question 1   Vision/Hearing Screen: No results found.    No results found for any visits on  03/04/22.    Assessment & Plan  Chronic obstructive pulmonary disease, unspecified COPD type ([place])  Anxiety state - Plan: busPIRone (BUSPAR) 7.5 MG tablet  Chronic respiratory failure with hypoxia (HCC)  Atherosclerosis of aorta (HCC) - Plan: CBC with Differential/Platelet, Comprehensive metabolic panel, Lipid panel, atorvastatin (LIPITOR) 10 MG tablet  Osteopenia, unspecified location - Plan: CBC with Differential/Platelet, Comprehensive metabolic panel  History of melanoma  Senile purpura ([place])  Rosacea - Plan: tretinoin (RETIN-A) 0.05 % cream  Annual wellness visit done today including the all of the following: Reviewed patient's Family Medical History Reviewed and updated list of patient's medical providers Assessment of cognitive impairment was done Assessed patient's functional ability Established a written schedule for health screening [place] Completed and Reviewed  Exercise Activities and Dietary recommendations  Goals   None     Immunization History  Administered Date(s) Administered   Fluad Quad(high Dose 65+) 02/25/2019, 02/28/2020, 04/11/2021   Influenza Split 07/03/2011, 02/14/2012   Influenza Whole 05/16/2004   Influenza, High Dose Seasonal PF 03/18/2013, 03/24/2014, 03/08/2015, 03/27/2018   Influenza,inj,Quad PF,6+ Mos 03/21/2016, 05/07/2017   Influenza-Unspecified 03/27/2018   PFIZER(Purple Top)SARS-COV-2 Vaccination 08/03/2019, 08/24/2019, 02/21/2020   PPD Test 05/30/1999   Pfizer Covid-19 Vaccine Bivalent Booster 6yr & up 02/28/2021   Pneumococcal Conjugate-13 03/24/2014, 02/12/2019   Pneumococcal Polysaccharide-23 08/06/2000, 01/30/2011   Td 09/23/2001   Tdap 01/30/2011, 08/16/2021   Zoster Recombinat (Shingrix) 12/20/2016, 03/27/2018, 03/27/2018   Zoster, Live 03/05/2011    Health Maintenance  Topic Date Due   COVID-19 Vaccine (5 - Pfizer series) 06/30/2021   INFLUENZA VACCINE  01/08/2022   TETANUS/TDAP  08/17/2031    Pneumonia Vaccine 76 Years old  Completed   DEXA SCAN  Completed   Hepatitis C Screening  Completed   Zoster Vaccines- Shingrix  Completed   HPV VACCINES  Aged Out   COLONOSCOPY (Pts 45-491yrInsurance coverage will need to be confirmed)  Discontinued     Discussed health benefits of physical activity, and encouraged her to engage in regular exercise appropriate for her age and condition.    Problem List Items Addressed This Visit     Atherosclerosis of aorta (HCC)   Relevant Medications   atorvastatin (LIPITOR) 10 MG tablet   Other Relevant Orders   CBC with Differential/Platelet   Comprehensive metabolic panel   Lipid panel   Chronic respiratory failure with hypoxia (HCC)   [place]D [place] obstructive [place] disease) [place]   History of melanoma   Osteopenia   Relevant Orders   CBC with Differential/Platelet   Comprehensive metabolic panel   Other Visit Diagnoses     Anxiety state       Relevant Medications   busPIRone (BUSPAR) 7.5 MG tablet   Senile purpura (HCC)       Relevant Medications   atorvastatin (LIPITOR) 10 MG tablet   Rosacea       Relevant Medications   tretinoin (RETIN-A) 0.05 % cream      She will continue on her present medication regimen.  Retin-A was added to her regimen.  We discussed her alcohol consumption at this point I do not see this is a big issue.  We discussed the possibility of the CT scan showing lung cancer and follow-up based on that.        JoJill AlexandersMD

## 2022-03-05 LAB — CBC WITH DIFFERENTIAL/PLATELET
Basophils Absolute: 0 10*3/uL (ref 0.0–0.2)
Basos: 0 %
EOS (ABSOLUTE): 0 10*3/uL (ref 0.0–0.4)
Eos: 0 %
Hematocrit: 39.3 % (ref 34.0–46.6)
Hemoglobin: 13.1 g/dL (ref 11.1–15.9)
Immature Grans (Abs): 0 10*3/uL (ref 0.0–0.1)
Immature Granulocytes: 0 %
Lymphocytes Absolute: 1.1 10*3/uL (ref 0.7–3.1)
Lymphs: 20 %
MCH: 31.3 pg (ref 26.6–33.0)
MCHC: 33.3 g/dL (ref 31.5–35.7)
MCV: 94 fL (ref 79–97)
Monocytes Absolute: 0.4 10*3/uL (ref 0.1–0.9)
Monocytes: 7 %
Neutrophils Absolute: 3.9 10*3/uL (ref 1.4–7.0)
Neutrophils: 73 %
Platelets: 211 10*3/uL (ref 150–450)
RBC: 4.19 x10E6/uL (ref 3.77–5.28)
RDW: 12.7 % (ref 11.7–15.4)
WBC: 5.4 10*3/uL (ref 3.4–10.8)

## 2022-03-05 LAB — COMPREHENSIVE METABOLIC PANEL
ALT: 17 IU/L (ref 0–32)
AST: 17 IU/L (ref 0–40)
Albumin/Globulin Ratio: 2.3 — ABNORMAL HIGH (ref 1.2–2.2)
Albumin: 4.2 g/dL (ref 3.8–4.8)
Alkaline Phosphatase: 96 IU/L (ref 44–121)
BUN/Creatinine Ratio: 17 (ref 12–28)
BUN: 10 mg/dL (ref 8–27)
Bilirubin Total: 0.4 mg/dL (ref 0.0–1.2)
CO2: 28 mmol/L (ref 20–29)
Calcium: 8.9 mg/dL (ref 8.7–10.3)
Chloride: 103 mmol/L (ref 96–106)
Creatinine, Ser: 0.59 mg/dL (ref 0.57–1.00)
Globulin, Total: 1.8 g/dL (ref 1.5–4.5)
Glucose: 92 mg/dL (ref 70–99)
Potassium: 4.3 mmol/L (ref 3.5–5.2)
Sodium: 142 mmol/L (ref 134–144)
Total Protein: 6 g/dL (ref 6.0–8.5)
eGFR: 93 mL/min/{1.73_m2} (ref >59)

## 2022-03-05 LAB — LIPID PANEL
Chol/HDL Ratio: 1.7 ratio (ref 0.0–4.4)
Cholesterol, Total: 117 mg/dL (ref 100–199)
HDL: 69 mg/dL (ref >39)
LDL Chol Calc (NIH): 33 mg/dL (ref 0–99)
Triglycerides: 75 mg/dL (ref 0–149)
VLDL Cholesterol Cal: 15 mg/dL (ref 5–40)

## 2022-03-09 ENCOUNTER — Telehealth: Payer: Self-pay | Admitting: Family Medicine

## 2022-03-09 NOTE — Telephone Encounter (Unsigned)
P.A. TRETINOIN

## 2022-03-11 ENCOUNTER — Telehealth: Payer: Self-pay

## 2022-03-11 NOTE — Telephone Encounter (Signed)
PA denied.

## 2022-03-11 NOTE — Telephone Encounter (Signed)
Pt. Called wanting to know if she could get her prescription for Retin-A printed off so she can get it from a place [place] cheaper.

## 2022-03-12 MED ORDER — CLINDAMYCIN PHOSPHATE 1 % EX GEL: Freq: Two times a day (BID) | CUTANEOUS | 5 refills | Status: AC

## 2022-03-12 NOTE — Telephone Encounter (Signed)
Medicare does not cover, pt informed, she would like to be switched to whatever Medicare will cover.  Called [employer] t# [phone] and covered alternatives are Benzoyl peroxide erythromycin gel 5-3% 46.6 gm Tier 4 and Clindamycin phosphate lotion 1% 60 ml Tier 3 can pt be switched to either of these?

## 2022-03-19 ENCOUNTER — Encounter: Payer: Self-pay | Admitting: Internal Medicine

## 2022-04-01 ENCOUNTER — Encounter: Payer: Self-pay | Admitting: Internal Medicine

## 2022-04-04 DIAGNOSIS — Z23 Encounter for immunization: Secondary | ICD-10-CM | POA: Diagnosis not present

## 2022-04-30 DIAGNOSIS — Z8349 Family history of other endocrine, nutritional and metabolic diseases: Secondary | ICD-10-CM | POA: Diagnosis not present

## 2022-04-30 DIAGNOSIS — D8481 Immunodeficiency due to conditions classified elsewhere: Secondary | ICD-10-CM | POA: Diagnosis not present

## 2022-04-30 DIAGNOSIS — L719 Rosacea, unspecified: Secondary | ICD-10-CM | POA: Diagnosis not present

## 2022-05-24 DIAGNOSIS — Z23 Encounter for immunization: Secondary | ICD-10-CM | POA: Diagnosis not present

## 2022-07-05 ENCOUNTER — Other Ambulatory Visit: Payer: Self-pay | Admitting: Family Medicine

## 2022-07-16 ENCOUNTER — Other Ambulatory Visit: Payer: Self-pay | Admitting: Pulmonary Disease

## 2022-07-16 DIAGNOSIS — J449 Chronic obstructive pulmonary disease, unspecified: Secondary | ICD-10-CM

## 2022-07-23 ENCOUNTER — Telehealth: Payer: Self-pay

## 2022-07-23 ENCOUNTER — Telehealth (INDEPENDENT_AMBULATORY_CARE_PROVIDER_SITE_OTHER): Payer: Medicare Other | Admitting: Medical

## 2022-07-23 VITALS — HR 88 | Wt 154.0 lb

## 2022-07-23 DIAGNOSIS — J988 Other specified respiratory disorders: Secondary | ICD-10-CM | POA: Diagnosis not present

## 2022-07-23 DIAGNOSIS — J449 Chronic obstructive pulmonary disease, unspecified: Secondary | ICD-10-CM | POA: Diagnosis not present

## 2022-07-23 DIAGNOSIS — J9611 Chronic respiratory failure with hypoxia: Secondary | ICD-10-CM

## 2022-07-23 MED ORDER — AMOXICILLIN-POT CLAVULANATE 875-125 MG PO TABS: 1.0000 | ORAL_TABLET | Freq: Two times a day (BID) | ORAL | 0 refills | Status: DC

## 2022-07-23 MED ORDER — ALBUTEROL SULFATE HFA 108 (90 BASE) MCG/ACT IN AERS: 2.0000 | INHALATION_SPRAY | Freq: Four times a day (QID) | RESPIRATORY_TRACT | 0 refills | Status: DC | PRN

## 2022-07-23 NOTE — Telephone Encounter (Signed)
Pt. Called back took covid home test and it was negative.

## 2022-07-23 NOTE — Progress Notes (Signed)
Subjective:     Patient ID: Courtney Ayers, female   DOB: [birth year], 77 y.o.   MRN: [id number]  This visit type was conducted due to national recommendations for restrictions regarding the COVID-19 Pandemic (e.g. social distancing) in an effort to limit this patient's exposure and mitigate transmission in our community.  Due to their co-morbid illnesses, this patient is at least at moderate risk for complications without adequate follow up.  This format is felt to be most appropriate for this patient at this time.    Documentation for virtual audio and video telecommunications through [facility] encounter:  The patient was located at home. The provider was located in the office. The patient did consent to this visit and is aware of possible charges through their insurance for this visit.  The other persons participating in this telemedicine service were none. Time spent on call was 20 minutes and in review of previous records 20 minutes total.  This virtual service is not related to other E/M service within previous 7 days.   HPI Chief Complaint  Patient presents with   cough    Coughing up thick mucous- yellowish, SOB, symptoms started 3-4 days. No covid test done   Virtual consult for illness.    She reports 3-4 day hx/o cough, has some extra thick mucous, yellowish not whitish clear.  Feeling more tired than usual.  On oxygen 2L/min, 24/7.  No fever, no body ache or chills.  Nose feels stopped up.   Some morning sore throat, no ear pain.   No sick contacts.  Has a covid test but didn't do a test this week.  Has had flu shot and covid boosters.  No hemoptysis.   Last antibiotics at least a year ago.    Fortunately has not had to be on antibiotics in recent months.  Her in May which is her sister says that she has been seeming more cough and congestion and fatigue in the last several days so she feels like she is coming down with illness  No leg swelling.  No chest pain.     No  other aggravating or relieving factors. No other complaint.   Past Medical History:  Diagnosis Date   Adenomatous colon polyp    Chronic respiratory failure ([place])    Chronic rhinitis    COPD with emphysema ([place])    Depression 2006   some anxiety recurs when she has stopped med in past   Deviated nasal septum    Hyperlipidemia    OA (osteoarthritis)    B THUMB, RIGHT > LEFT HIP, BACK   Osteopenia    Current Outpatient Medications on File Prior to Visit  Medication Sig Dispense Refill   albuterol (PROVENTIL) (2.5 MG/3ML) 0.083% nebulizer solution USE 1 VIAL VIA NEBULIZER EVERY 2 HOURS AS NEEDED FOR SHORTNESS OF BREATH 375 mL 5   ALPRAZolam (XANAX) 0.5 MG tablet Take 1 tablet (0.5 mg total) by mouth at bedtime as needed for anxiety. 60 tablet 1   atorvastatin (LIPITOR) 10 MG tablet Take 1 tablet (10 mg total) by mouth daily. 90 tablet 3   BREZTRI AEROSPHERE 160-9-4.8 MCG/ACT AERO INHALE 2 PUFFS INTO THE LUNGS IN THE MORNING AND AT BEDTIME 10.7 each 11   busPIRone (BUSPAR) 7.5 MG tablet Take 1 tablet (7.5 mg total) by mouth 2 (two) times daily. 180 tablet 3   Calcium Carbonate-Vitamin D (CALCIUM-VITAMIN D) 500-200 MG-UNIT tablet Take 1 tablet by mouth daily.     citalopram (CELEXA) 10 MG  tablet Take 1 tablet (10 mg total) by mouth daily. 90 tablet 3   clindamycin (CLINDAGEL) 1 % gel Apply topically 2 (two) times daily. 30 g 5   Guaifenesin 1200 MG TB12 Take 1,200 mg by mouth 2 (two) times daily.      ibandronate (BONIVA) 150 MG tablet Take 1 tablet (150 mg total) by mouth every 30 (thirty) days. Take in the morning with a full glass of water, on an empty stomach, and do not take anything else by mouth or lie down for the next 30 min. 3 tablet 3   montelukast (SINGULAIR) 10 MG tablet TAKE 1 TABLET BY MOUTH EVERYDAY AT BEDTIME 90 tablet 1   sodium chloride HYPERTONIC 3 % nebulizer solution Take by nebulization 2 (two) times daily as needed for other. 360 mL 11   Spacer/Aero-Holding Chambers  DEVI 1 Dose by Does not apply route 2 (two) times daily. 1 Dose 0   tretinoin (RETIN-A) 0.05 % cream Apply topically at bedtime. 45 g 0   fluticasone (FLONASE) 50 MCG/ACT nasal spray Place 2 sprays into both nostrils daily. 16 g 5   OXYGEN Inhale 2 L into the lungs.     No current facility-administered medications on file prior to visit.    Review of Systems As in subjective    Objective:   Physical Exam Due to coronavirus pandemic stay at home measures, patient visit was virtual and they were not examined in person.   Pulse 88   Wt 154 lb (69.9 kg)   LMP 06/10/1992   SpO2 94%   BMI 23.42 kg/m   Gen: wd, wn, nad She does have pursed lips, on oxygen, no obvious wheezing but has chronic respiratory failure in general Answers questions in complete sentences      Assessment:     Encounter Diagnoses  Name Primary?   Chronic obstructive pulmonary disease, unspecified COPD type ([place]) Yes   Chronic respiratory failure with hypoxia ([place])    Respiratory tract infection        Plan:     I will go ahead and treat her for COPD flareup.  Begin  antibiotic below, rest, hydrate well.  Continue your normal regimen for COPD maintenance, continue albuterol for flareup of breathing and wheezing  She is already on 2 L of oxygen 24/7.  She already does her prevention inhaler, her albuterol, her nebulized hypertonic saline and Mucinex regularly  If not improving over the next few days or if worsening she needs get reevaluated  She asked for refill on inhaler.  She sometimes uses [facility] as it is better cost and local pharmacy  We will try local pharmacy first to see with the current prices  She was not able to give me additional vital signs today.  She will get some more vital signs and check a COVID test at home and call back  Addendum: she called back about an hour later and home covid test was negative    Courtney Ayers was seen today for cough.  Diagnoses and all orders for  this visit:  Chronic obstructive pulmonary disease, unspecified COPD type ([place])  Chronic respiratory failure with hypoxia ([place])  Respiratory tract infection  Other orders -     amoxicillin-clavulanate (AUGMENTIN) 875-125 MG tablet; Take 1 tablet by mouth 2 (two) times daily. -     albuterol (VENTOLIN HFA) 108 (90 Base) MCG/ACT inhaler; Inhale 2 puffs into the lungs every 6 (six) hours as needed for wheezing or shortness of breath.  F/u prn

## 2022-07-26 ENCOUNTER — Encounter: Payer: Self-pay | Admitting: Family Medicine

## 2022-08-09 ENCOUNTER — Telehealth: Payer: Self-pay | Admitting: Family Medicine

## 2022-08-09 ENCOUNTER — Other Ambulatory Visit: Payer: Self-pay | Admitting: Family Medicine

## 2022-08-09 DIAGNOSIS — M810 Age-related osteoporosis without current pathological fracture: Secondary | ICD-10-CM

## 2022-08-09 MED ORDER — IBANDRONATE SODIUM 150 MG PO TABS: ORAL_TABLET | ORAL | 3 refills | Status: DC

## 2022-08-09 NOTE — Telephone Encounter (Signed)
Recv'd fax from [facility] needs frequency of [place], missing from RX

## 2022-08-13 ENCOUNTER — Other Ambulatory Visit: Payer: Self-pay | Admitting: *Deleted

## 2022-08-13 DIAGNOSIS — M810 Age-related osteoporosis without current pathological fracture: Secondary | ICD-10-CM

## 2022-08-13 MED ORDER — IBANDRONATE SODIUM 150 MG PO TABS: ORAL_TABLET | ORAL | 3 refills | Status: DC

## 2022-08-14 ENCOUNTER — Other Ambulatory Visit: Payer: Self-pay | Admitting: Medical

## 2022-08-14 NOTE — Telephone Encounter (Signed)
Pt had 3 sent in for February 2024 should not be out

## 2022-08-30 ENCOUNTER — Other Ambulatory Visit: Payer: Self-pay | Admitting: Critical Care Medicine

## 2022-08-30 DIAGNOSIS — J209 Acute bronchitis, unspecified: Secondary | ICD-10-CM

## 2022-09-07 ENCOUNTER — Other Ambulatory Visit: Payer: Self-pay | Admitting: Medical

## 2022-09-09 NOTE — Telephone Encounter (Signed)
Is this okay to refill? 

## 2022-09-26 ENCOUNTER — Telehealth: Payer: Self-pay

## 2022-09-26 NOTE — Progress Notes (Signed)
Care Management & Coordination Services Pharmacy Team  Reason for Encounter: General adherence update   Contacted patient for general health update and medication adherence call.  {US HC Outreach:28874}   What concerns do you have about your medications?  The patient {denies/reports:25180} side effects with their medications.   How often do you forget or accidentally miss a dose? {missed doses:25554}  Do you use a pillbox? {yes/no:20286}  Are you having any problems getting your medications from your pharmacy? {yes/no:20286}  Has the cost of your medications been a concern? {yes/no:20286} If yes, what medication and is patient assistance available or has it been applied for?  Since last visit with PharmD, {no/thefollowing:25210} interventions have been made.   The patient {has/has not:25209} had an ED visit since last contact.   The patient {denies/reports:25180} problems with their health.   Patient {denies/reports:25180} concerns or questions for ***, PharmD at this time.   Counseled patient on: {GENERALCOUNSELING:28686}  Marylene Land F/U in June  Chart Updates:  Recent office visits:  07/23/22 Tysinger, Kermit Balo, PA-C - Patient presented for COPD and other concerns. Prescribed Amoxicillin. Stopped Zofran.   Recent consult visits:  None  Hospital visits:  None in previous 6 months  Medications: Outpatient Encounter Medications as of 09/26/2022  Medication Sig   albuterol (PROVENTIL) (2.5 MG/3ML) 0.083% nebulizer solution USE 1 VIAL VIA NEBULIZER EVERY 2 HOURS AS NEEDED FOR SHORTNESS OF BREATH   albuterol (VENTOLIN HFA) 108 (90 Base) MCG/ACT inhaler TAKE 2 PUFFS BY MOUTH EVERY 6 HOURS AS NEEDED FOR WHEEZE OR SHORTNESS OF BREATH   ALPRAZolam (XANAX) 0.5 MG tablet Take 1 tablet (0.5 mg total) by mouth at bedtime as needed for anxiety.   amoxicillin-clavulanate (AUGMENTIN) 875-125 MG tablet Take 1 tablet by mouth 2 (two) times daily.   atorvastatin (LIPITOR) 10 MG tablet  Take 1 tablet (10 mg total) by mouth daily.   BREZTRI AEROSPHERE 160-9-4.8 MCG/ACT AERO INHALE 2 PUFFS INTO THE LUNGS IN THE MORNING AND AT BEDTIME   busPIRone (BUSPAR) 7.5 MG tablet Take 1 tablet (7.5 mg total) by mouth 2 (two) times daily.   Calcium Carbonate-Vitamin D (CALCIUM-VITAMIN D) 500-200 MG-UNIT tablet Take 1 tablet by mouth daily.   citalopram (CELEXA) 10 MG tablet Take 1 tablet (10 mg total) by mouth daily.   clindamycin (CLINDAGEL) 1 % gel Apply topically 2 (two) times daily.   fluticasone (FLONASE) 50 MCG/ACT nasal spray Place 2 sprays into both nostrils daily.   Guaifenesin 1200 MG TB12 Take 1,200 mg by mouth 2 (two) times daily.    ibandronate (BONIVA) 150 MG tablet TAKE 1TAB BY MOUTH MONTHLY IN THE AM WITH FULL GLASS OF WATER,ON EMPTY STOMACH.DO NOT TAKE ANYTHING ELSE OR LIE DOWN FOR   montelukast (SINGULAIR) 10 MG tablet TAKE 1 TABLET BY MOUTH EVERYDAY AT BEDTIME   OXYGEN Inhale 2 L into the lungs.   sodium chloride HYPERTONIC 3 % nebulizer solution USE 1 VIAL ( ) VIA NEBULIZER TWICE DAILY AS NEEDED.   Spacer/Aero-Holding Chambers DEVI 1 Dose by Does not apply route 2 (two) times daily.   tretinoin (RETIN-A) 0.05 % cream Apply topically at bedtime.   No facility-administered encounter medications on file as of 09/26/2022.    Recent vitals BP Readings from Last 3 Encounters:  03/04/22 124/82  02/19/22 132/78  07/05/21 128/84   Pulse Readings from Last 3 Encounters:  07/23/22 88  03/04/22 72  02/19/22 79   Wt Readings from Last 3 Encounters:  07/23/22 154 lb (69.9 kg)  03/04/22  156 lb 3.2 oz (70.9 kg)  02/19/22 155 lb 12.8 oz (70.7 kg)   BMI Readings from Last 3 Encounters:  07/23/22 23.42 kg/m  03/04/22 23.75 kg/m  02/19/22 24.04 kg/m    Recent lab results    Component Value Date/Time   NA 142 03/04/2022 1451   K 4.3 03/04/2022 1451   CL 103 03/04/2022 1451   CO2 28 03/04/2022 1451   GLUCOSE 92 03/04/2022 1451   GLUCOSE 134 (H) 09/03/2018  0440   BUN 10 03/04/2022 1451   CREATININE 0.59 03/04/2022 1451   CREATININE 0.66 07/01/2016 1404   CALCIUM 8.9 03/04/2022 1451    Lab Results  Component Value Date   CREATININE 0.59 03/04/2022   EGFR 93 03/04/2022   GFRNONAA 75 02/28/2020   GFRAA 87 02/28/2020   Lab Results  Component Value Date/Time   HGBA1C 5.3 02/28/2021 04:59 PM   HGBA1C 5.3 02/28/2020 03:07 PM   HGBA1C 5.1 07/01/2016 02:04 PM    Lab Results  Component Value Date   CHOL 117 03/04/2022   HDL 69 03/04/2022   LDLCALC 33 03/04/2022   TRIG 75 03/04/2022   CHOLHDL 1.7 03/04/2022    Care Gaps: COVID Booster - Overdue   Star Rating Drugs:  Atorvastatin 10 mg  - Last [facility]     Pamala Duffel CMA Clinical Pharmacist Assistant [phone]

## 2022-10-08 ENCOUNTER — Ambulatory Visit (INDEPENDENT_AMBULATORY_CARE_PROVIDER_SITE_OTHER): Payer: Medicare Other | Admitting: Pulmonary Disease

## 2022-10-08 ENCOUNTER — Encounter: Payer: Self-pay | Admitting: Pulmonary Disease

## 2022-10-08 VITALS — BP 122/70 | HR 75 | Ht 68.0 in | Wt 155.6 lb

## 2022-10-08 DIAGNOSIS — I739 Peripheral vascular disease, unspecified: Secondary | ICD-10-CM | POA: Diagnosis not present

## 2022-10-08 DIAGNOSIS — J449 Chronic obstructive pulmonary disease, unspecified: Secondary | ICD-10-CM

## 2022-10-08 DIAGNOSIS — J9611 Chronic respiratory failure with hypoxia: Secondary | ICD-10-CM

## 2022-10-08 DIAGNOSIS — R600 Localized edema: Secondary | ICD-10-CM

## 2022-10-08 NOTE — Patient Instructions (Addendum)
Continue breztri inhaler 2 puffs twice daily - rinse mouth out after each use  Resume hypertonic saline nebs twice daily followed by flutter valve for mucous clearance.   Use as needed albuterol inhaler or nebulizer treatment  Consider adding humidification to your oxygen concentrator.  Follow up in 6 months

## 2022-10-08 NOTE — Progress Notes (Signed)
Synopsis: Referred in January 2023 for COPD, former patient of Dr. Chestine Spore  Subjective:   PATIENT ID: Courtney Ayers GENDER: female DOB: 1946/02/08, MRN: [id number]  HPI  Chief Complaint  Patient presents with   Follow-up    F/U on COPD. States her breathing has been stable since last visit.    Courtney Ayers is a 77 year old woman, former smoker with COPD and chronic hypoxemic respiratory failure who returns to pulmonary clinic for follow up.   She was seen by Dr. Craige Cotta 02/19/22 for hemoptysis. Chest x-ray performed which showed lung nodule and CT Chest done on 03/04/22 showed near complete resolution of spiculated nodule of RUL. No change in RLL nodule measuring 1.2x0.9cm.   She is not coughing up as much mucous as before.  She is taking mucinex twice daily.  Denies any further issues of hemoptysis.  Reports her breathing is stable at this time.   OV 07/05/21 She was last seen 09/05/2020 by Buelah Manis, NP. She was started on breztri 2 puffs twice daily at that time with improvement in her breathing. She continues to use 2L of supplemental oxygen. She is able to carry out her activities of daily living with some dyspnea. She has edema of her left lower extremity that occurs during the day but resolves overnight after sleeping in bed. She had a gastrointestinal illness followed by a sinus/respiratory infection in December which she has recovered well from.   She is accompanied by her sister today.  Past Medical History:  Diagnosis Date   Adenomatous colon polyp    Chronic respiratory failure (HCC)    Chronic rhinitis    COPD with emphysema (HCC)    Depression 2006   some anxiety recurs when she has stopped med in past   Deviated nasal septum    Hyperlipidemia    OA (osteoarthritis)    B THUMB, RIGHT > LEFT HIP, BACK   Osteopenia      Family History  Problem Relation Age of Onset   Cancer Mother        bladder, metastatic   Alcohol abuse Father    Heart disease Father         CHF   Hyperlipidemia Father    Hypertension Father    Aortic aneurysm Sister    Crohn's disease Sister    Hyperlipidemia Sister    Cancer Sister        brain cancer in her 40's   Diabetes Maternal Uncle    Cancer Maternal Grandmother 77       pancreatic   Cancer Maternal Grandfather        bladder cancer in 57's   Stroke Paternal Grandmother    Heart disease Paternal Grandfather        MI later 94's, early 57's     Social History   Socioeconomic History   Marital status: Single    Spouse name: Not on file   Number of children: Not on file   Years of education: Not on file   Highest education level: Not on file  Occupational History   Not on file  Tobacco Use   Smoking status: Former    Packs/day: 1.00    Years: 45.00    Additional pack years: 0.00    Total pack years: 45.00    Types: Cigarettes    Quit date: 08/05/2014    Years since quitting: 8.1   Smokeless tobacco: Never  Vaping Use   Vaping Use: Never used  Substance and Sexual Activity   Alcohol use: Yes    Alcohol/week: 0.0 standard drinks of alcohol    Comment: 2 drinks per night   Drug use: No   Sexual activity: Not Currently    Partners: Female  Other Topics Concern   Not on file  Social History Narrative   Lives with other members of the family, 5 cat in the home, 2 dog outside   Social Determinants of Health   Financial Resource Strain: Low Risk  (05/17/2021)   Overall Financial Resource Strain (CARDIA)    Difficulty of Paying Living Expenses: Not hard at all  Food Insecurity: Not on file  Transportation Needs: No Transportation Needs (05/17/2021)   PRAPARE - Administrator, Civil Service (Medical): No    Lack of Transportation (Non-Medical): No  Physical Activity: Not on file  Stress: Not on file  Social Connections: Not on file  Intimate Partner Violence: Not on file     No Known Allergies   Outpatient Medications Prior to Visit  Medication Sig Dispense Refill   albuterol  (PROVENTIL) (2.5 MG/3ML) 0.083% nebulizer solution USE 1 VIAL VIA NEBULIZER EVERY 2 HOURS AS NEEDED FOR SHORTNESS OF BREATH 375 mL 5   albuterol (VENTOLIN HFA) 108 (90 Base) MCG/ACT inhaler TAKE 2 PUFFS BY MOUTH EVERY 6 HOURS AS NEEDED FOR WHEEZE OR SHORTNESS OF BREATH 18 each 5   ALPRAZolam (XANAX) 0.5 MG tablet Take 1 tablet (0.5 mg total) by mouth at bedtime as needed for anxiety. 60 tablet 1   atorvastatin (LIPITOR) 10 MG tablet Take 1 tablet (10 mg total) by mouth daily. 90 tablet 3   BREZTRI AEROSPHERE 160-9-4.8 MCG/ACT AERO INHALE 2 PUFFS INTO THE LUNGS IN THE MORNING AND AT BEDTIME 10.7 each 11   busPIRone (BUSPAR) 7.5 MG tablet Take 1 tablet (7.5 mg total) by mouth 2 (two) times daily. 180 tablet 3   Calcium Carbonate-Vitamin D (CALCIUM-VITAMIN D) 500-200 MG-UNIT tablet Take 1 tablet by mouth daily.     citalopram (CELEXA) 10 MG tablet Take 1 tablet (10 mg total) by mouth daily. 90 tablet 3   clindamycin (CLINDAGEL) 1 % gel Apply topically 2 (two) times daily. 30 g 5   Guaifenesin 1200 MG TB12 Take 1,200 mg by mouth 2 (two) times daily.      ibandronate (BONIVA) 150 MG tablet TAKE 1TAB BY MOUTH MONTHLY IN THE AM WITH FULL GLASS OF WATER,ON EMPTY STOMACH.DO NOT TAKE ANYTHING ELSE OR LIE DOWN FOR 3 tablet 3   montelukast (SINGULAIR) 10 MG tablet TAKE 1 TABLET BY MOUTH EVERYDAY AT BEDTIME 90 tablet 1   OXYGEN Inhale 2 L into the lungs.     sodium chloride HYPERTONIC 3 % nebulizer solution USE 1 VIAL ( ) VIA NEBULIZER TWICE DAILY AS NEEDED. 360 mL 11   Spacer/Aero-Holding Chambers DEVI 1 Dose by Does not apply route 2 (two) times daily. 1 Dose 0   tretinoin (RETIN-A) 0.05 % cream Apply topically at bedtime. 45 g 0   fluticasone (FLONASE) 50 MCG/ACT nasal spray Place 2 sprays into both nostrils daily. 16 g 5   amoxicillin-clavulanate (AUGMENTIN) 875-125 MG tablet Take 1 tablet by mouth 2 (two) times daily. 20 tablet 0   No facility-administered medications prior to visit.    Review  of Systems  Constitutional:  Negative for chills, fever, malaise/fatigue and weight loss.  HENT:  Negative for congestion, sinus pain and sore throat.   Eyes: Negative.   Respiratory:  Positive for shortness  of breath. Negative for cough, hemoptysis, sputum production and wheezing.   Cardiovascular:  Negative for chest pain, palpitations, orthopnea, claudication and leg swelling.  Gastrointestinal:  Negative for abdominal pain, heartburn, nausea and vomiting.  Genitourinary: Negative.   Musculoskeletal:  Negative for joint pain and myalgias.  Skin:  Negative for rash.  Neurological:  Negative for weakness.  Endo/Heme/Allergies: Negative.   Psychiatric/Behavioral: Negative.      Objective:   Vitals:   10/08/22 1327  BP: 122/70  Pulse: 75  SpO2: 98%  Weight: 155 lb 9.6 oz (70.6 kg)  Height: 5\' 8"  (1.727 m)    Physical Exam Constitutional:      General: She is not in acute distress.    Appearance: She is not ill-appearing.     Comments: thin  HENT:     Head: Normocephalic and atraumatic.  Eyes:     General: No scleral icterus.    Conjunctiva/sclera: Conjunctivae normal.  Cardiovascular:     Rate and Rhythm: Normal rate and regular rhythm.     Pulses: Normal pulses.     Heart sounds: Normal heart sounds. No murmur heard. Pulmonary:     Effort: Pulmonary effort is normal.     Breath sounds: Normal breath sounds. Decreased air movement present. No wheezing, rhonchi or rales.  Musculoskeletal:     Right lower leg: No edema.     Left lower leg: No edema.  Skin:    General: Skin is warm and dry.  Neurological:     General: No focal deficit present.     Mental Status: She is alert.     CBC    Component Value Date/Time   WBC 5.4 03/04/2022 1451   WBC 9.3 09/03/2018 0440   RBC 4.19 03/04/2022 1451   RBC 3.46 (L) 09/03/2018 0440   HGB 13.1 03/04/2022 1451   HCT 39.3 03/04/2022 1451   PLT 211 03/04/2022 1451   MCV 94 03/04/2022 1451   MCH 31.3 03/04/2022 1451   MCH  30.3 09/03/2018 0440   MCHC 33.3 03/04/2022 1451   MCHC 29.7 (L) 09/03/2018 0440   RDW 12.7 03/04/2022 1451   LYMPHSABS 1.1 03/04/2022 1451   MONOABS 0.6 09/02/2018 0508   EOSABS 0.0 03/04/2022 1451   BASOSABS 0.0 03/04/2022 1451      Latest Ref Rng & Units 03/04/2022    2:51 PM 02/28/2021   12:00 AM 02/28/2020    3:07 PM  BMP  Glucose 70 - 99 mg/dL 92  94  161   BUN 8 - 27 mg/dL 10  10  15    Creatinine 0.57 - 1.00 mg/dL 0.96  0.45  4.09   BUN/Creat Ratio 12 - 28 17  14  19    Sodium 134 - 144 mmol/L 142  145  145   Potassium 3.5 - 5.2 mmol/L 4.3  4.5  4.4   Chloride 96 - 106 mmol/L 103  104  103   CO2 20 - 29 mmol/L 28  28  30    Calcium 8.7 - 10.3 mg/dL 8.9  9.1  9.5    Chest imaging: CT Chest 03/04/22 Mediastinum/Nodes: No enlarged mediastinal, hilar, or axillary lymph nodes. Thyroid gland, trachea, and esophagus demonstrate no significant findings.   Lungs/Pleura: Severe pulmonary hyperinflation and emphysema. Previously seen spiculated nodular consolidations are almost completely resolved, with only faint irregular residua, for example in the right apex (series 4, image 39). Bandlike atelectasis or consolidation of the left lung base (series 4, image 130). Well-circumscribed, rounded nodule of  the medial right lower lobe again appears to demonstrate internal fat density and is unchanged, measuring 1.2 x 0.9 cm (series 3, image 149). No pleural effusion or pneumothorax.    CXR 09/05/20 The heart size and mediastinal contours are within normal limits. No acute pulmonary disease is noted. Emphysematous disease is noted in the upper lobes bilaterally with associated scarring. Hyperexpansion of the lungs is noted. No pneumothorax or pleural effusion is noted. The visualized skeletal structures are unremarkable.  PFT:    Latest Ref Rng & Units 10/07/2014    1:03 PM  PFT Results  FVC-Pre L 2.36   FVC-Predicted Pre % 67   FVC-Post L 2.51   FVC-Predicted Post % 72   Pre  FEV1/FVC % % 33   Post FEV1/FCV % % 33   FEV1-Pre L 0.77   FEV1-Predicted Pre % 29   FEV1-Post L 0.82   DLCO uncorrected ml/min/mmHg 7.55   DLCO UNC% % 25   DLVA Predicted % 26   TLC L 7.07   TLC % Predicted % 125   RV % Predicted % 180   Severe obstruction.   Labs:  Path:  Echo 2016: LV EF 60-65%. Grade I diastolic dysfunction. RV size mildly dilated with normal function.  Heart Catheterization:  Assessment & Plan:   Chronic obstructive pulmonary disease, unspecified COPD type (HCC)  Chronic respiratory failure with hypoxia (HCC)  Lower extremity edema - Plan: Ambulatory referral to Cardiology  Peripheral vascular disease (HCC) - Plan: Ambulatory referral to Cardiology  Discussion: Courtney Ayers is a 77 year old woman, former smoker with COPD and chronic hypoxemic respiratory failure who returns to pulmonary clinic for follow up.   She is to continue on breztri inhaler therapy for her COPD.  She is to resume hypertonic saline nebs twice daily followed by flutter valve for mucus clearance.  She is to consider adding humidification to her oxygen concentrator.  She is to follow up in 6 months.   Melody Comas, MD [facility] Office: [phone]    Current Outpatient Medications:    albuterol (PROVENTIL) (2.5 MG/3ML) 0.083% nebulizer solution, USE 1 VIAL VIA NEBULIZER EVERY 2 HOURS AS NEEDED FOR SHORTNESS OF BREATH, Disp: 375 mL, Rfl: 5   albuterol (VENTOLIN HFA) 108 (90 Base) MCG/ACT inhaler, TAKE 2 PUFFS BY MOUTH EVERY 6 HOURS AS NEEDED FOR WHEEZE OR SHORTNESS OF BREATH, Disp: 18 each, Rfl: 5   ALPRAZolam (XANAX) 0.5 MG tablet, Take 1 tablet (0.5 mg total) by mouth at bedtime as needed for anxiety., Disp: 60 tablet, Rfl: 1   atorvastatin (LIPITOR) 10 MG tablet, Take 1 tablet (10 mg total) by mouth daily., Disp: 90 tablet, Rfl: 3   BREZTRI AEROSPHERE 160-9-4.8 MCG/ACT AERO, INHALE 2 PUFFS INTO THE LUNGS IN THE MORNING AND AT BEDTIME, Disp:  10.7 each, Rfl: 11   busPIRone (BUSPAR) 7.5 MG tablet, Take 1 tablet (7.5 mg total) by mouth 2 (two) times daily., Disp: 180 tablet, Rfl: 3   Calcium Carbonate-Vitamin D (CALCIUM-VITAMIN D) 500-200 MG-UNIT tablet, Take 1 tablet by mouth daily., Disp: , Rfl:    citalopram (CELEXA) 10 MG tablet, Take 1 tablet (10 mg total) by mouth daily., Disp: 90 tablet, Rfl: 3   clindamycin (CLINDAGEL) 1 % gel, Apply topically 2 (two) times daily., Disp: 30 g, Rfl: 5   Guaifenesin 1200 MG TB12, Take 1,200 mg by mouth 2 (two) times daily. , Disp: , Rfl:    ibandronate (BONIVA) 150 MG tablet, TAKE 1TAB BY  MOUTH MONTHLY IN THE AM WITH FULL GLASS OF WATER,ON EMPTY STOMACH.DO NOT TAKE ANYTHING ELSE OR LIE DOWN FOR , Disp: 3 tablet, Rfl: 3   montelukast (SINGULAIR) 10 MG tablet, TAKE 1 TABLET BY MOUTH EVERYDAY AT BEDTIME, Disp: 90 tablet, Rfl: 1   OXYGEN, Inhale 2 L into the lungs., Disp: , Rfl:    sodium chloride HYPERTONIC 3 % nebulizer solution, USE 1 VIAL ( ) VIA NEBULIZER TWICE DAILY AS NEEDED., Disp: 360 mL, Rfl: 11   Spacer/Aero-Holding Chambers DEVI, 1 Dose by Does not apply route 2 (two) times daily., Disp: 1 Dose, Rfl: 0   tretinoin (RETIN-A) 0.05 % cream, Apply topically at bedtime., Disp: 45 g, Rfl: 0   fluticasone (FLONASE) 50 MCG/ACT nasal spray, Place 2 sprays into both nostrils daily., Disp: 16 g, Rfl: 5

## 2022-10-11 ENCOUNTER — Encounter: Payer: Self-pay | Admitting: Pulmonary Disease

## 2022-10-29 ENCOUNTER — Ambulatory Visit: Payer: Medicare Other | Admitting: Cardiovascular Disease

## 2022-11-11 ENCOUNTER — Ambulatory Visit: Payer: Medicare Other | Admitting: Cardiology

## 2022-11-18 ENCOUNTER — Telehealth: Payer: Self-pay

## 2022-11-18 NOTE — Progress Notes (Deleted)
Patient ID: Courtney Ayers, female   DOB: 1946/01/09, 77 y.o.   MRN: [id number]  Care Management & Coordination Services Pharmacy Team  Reason for Encounter: General adherence update   Contacted patient for general health update and medication adherence call.  {US HC Outreach:28874}   What concerns do you have about your medications?  The patient {denies/reports:25180} side effects with their medications.   How often do you forget or accidentally miss a dose? {missed doses:25554}  Do you use a pillbox? {yes/no:20286}  Are you having any problems getting your medications from your pharmacy? {yes/no:20286}  Has the cost of your medications been a concern? {yes/no:20286} If yes, what medication and is patient assistance available or has it been applied for?  Since last visit with PharmD, {no/thefollowing:25210} interventions have been made.   The patient {has/has not:25209} had an ED visit since last contact.   The patient {denies/reports:25180} problems with their health.   Patient {denies/reports:25180} concerns or questions for ***, PharmD at this time.   Counseled patient on: {GENERALCOUNSELING:28686}   Chart Updates:  Recent office visits:  10/08/22 Martina Sinner, MD - Patent presented for Chronic obstructive pulmonary disease and other concerns. Stopped Amoxicillin.   Recent consult visits:  None  Hospital visits:  None in previous 6 months  Medications: Outpatient Encounter Medications as of 11/18/2022  Medication Sig   albuterol (PROVENTIL) (2.5 MG/3ML) 0.083% nebulizer solution USE 1 VIAL VIA NEBULIZER EVERY 2 HOURS AS NEEDED FOR SHORTNESS OF BREATH   albuterol (VENTOLIN HFA) 108 (90 Base) MCG/ACT inhaler TAKE 2 PUFFS BY MOUTH EVERY 6 HOURS AS NEEDED FOR WHEEZE OR SHORTNESS OF BREATH   ALPRAZolam (XANAX) 0.5 MG tablet Take 1 tablet (0.5 mg total) by mouth at bedtime as needed for anxiety.   atorvastatin (LIPITOR) 10 MG tablet Take 1 tablet (10 mg total)  by mouth daily.   BREZTRI AEROSPHERE 160-9-4.8 MCG/ACT AERO INHALE 2 PUFFS INTO THE LUNGS IN THE MORNING AND AT BEDTIME   busPIRone (BUSPAR) 7.5 MG tablet Take 1 tablet (7.5 mg total) by mouth 2 (two) times daily.   Calcium Carbonate-Vitamin D (CALCIUM-VITAMIN D) 500-200 MG-UNIT tablet Take 1 tablet by mouth daily.   citalopram (CELEXA) 10 MG tablet Take 1 tablet (10 mg total) by mouth daily.   clindamycin (CLINDAGEL) 1 % gel Apply topically 2 (two) times daily.   fluticasone (FLONASE) 50 MCG/ACT nasal spray Place 2 sprays into both nostrils daily.   Guaifenesin 1200 MG TB12 Take 1,200 mg by mouth 2 (two) times daily.    ibandronate (BONIVA) 150 MG tablet TAKE 1TAB BY MOUTH MONTHLY IN THE AM WITH FULL GLASS OF WATER,ON EMPTY STOMACH.DO NOT TAKE ANYTHING ELSE OR LIE DOWN FOR   montelukast (SINGULAIR) 10 MG tablet TAKE 1 TABLET BY MOUTH EVERYDAY AT BEDTIME   OXYGEN Inhale 2 L into the lungs.   sodium chloride HYPERTONIC 3 % nebulizer solution USE 1 VIAL ( ) VIA NEBULIZER TWICE DAILY AS NEEDED.   Spacer/Aero-Holding Chambers DEVI 1 Dose by Does not apply route 2 (two) times daily.   tretinoin (RETIN-A) 0.05 % cream Apply topically at bedtime.   No facility-administered encounter medications on file as of 11/18/2022.    Recent vitals BP Readings from Last 3 Encounters:  10/08/22 122/70  03/04/22 124/82  02/19/22 132/78   Pulse Readings from Last 3 Encounters:  10/08/22 75  07/23/22 88  03/04/22 72   Wt Readings from Last 3 Encounters:  10/08/22 155 lb 9.6 oz (70.6 kg)  07/23/22  154 lb (69.9 kg)  03/04/22 156 lb 3.2 oz (70.9 kg)   BMI Readings from Last 3 Encounters:  10/08/22 23.66 kg/m  07/23/22 23.42 kg/m  03/04/22 23.75 kg/m    Recent lab results    Component Value Date/Time   NA 142 03/04/2022 1451   K 4.3 03/04/2022 1451   CL 103 03/04/2022 1451   CO2 28 03/04/2022 1451   GLUCOSE 92 03/04/2022 1451   GLUCOSE 134 (H) 09/03/2018 0440   BUN 10 03/04/2022 1451    CREATININE 0.59 03/04/2022 1451   CREATININE 0.66 07/01/2016 1404   CALCIUM 8.9 03/04/2022 1451    Lab Results  Component Value Date   CREATININE 0.59 03/04/2022   EGFR 93 03/04/2022   GFRNONAA 75 02/28/2020   GFRAA 87 02/28/2020   Lab Results  Component Value Date/Time   HGBA1C 5.3 02/28/2021 04:59 PM   HGBA1C 5.3 02/28/2020 03:07 PM   HGBA1C 5.1 07/01/2016 02:04 PM    Lab Results  Component Value Date   CHOL 117 03/04/2022   HDL 69 03/04/2022   LDLCALC 33 03/04/2022   TRIG 75 03/04/2022   CHOLHDL 1.7 03/04/2022    Care Gaps: COVID Booster - Overdue Hepatitis C Screen - Overdue  Star Rating Drugs:  Atorvastatin 10 mg - Last filled 09/26/22 90 DS at [facility]   Pamala Duffel CMA Clinical Pharmacist Assistant [phone]

## 2022-12-10 ENCOUNTER — Other Ambulatory Visit: Payer: Self-pay | Admitting: Family Medicine

## 2022-12-10 DIAGNOSIS — F411 Generalized anxiety disorder: Secondary | ICD-10-CM

## 2022-12-24 ENCOUNTER — Ambulatory Visit: Payer: Medicare Other | Attending: Cardiovascular Disease | Admitting: Cardiovascular Disease

## 2022-12-24 ENCOUNTER — Encounter: Payer: Self-pay | Admitting: Cardiovascular Disease

## 2022-12-24 VITALS — BP 106/66 | HR 79 | Ht 67.5 in | Wt 156.6 lb

## 2022-12-24 DIAGNOSIS — I872 Venous insufficiency (chronic) (peripheral): Secondary | ICD-10-CM

## 2022-12-24 DIAGNOSIS — I739 Peripheral vascular disease, unspecified: Secondary | ICD-10-CM | POA: Diagnosis not present

## 2022-12-24 DIAGNOSIS — I700 Atherosclerosis of aorta: Secondary | ICD-10-CM | POA: Diagnosis present

## 2022-12-24 DIAGNOSIS — R0602 Shortness of breath: Secondary | ICD-10-CM | POA: Diagnosis present

## 2022-12-24 NOTE — Patient Instructions (Signed)
Medication Instructions:  No changes *If you need a refill on your cardiac medications before your next appointment, please call your pharmacy*   Lab Work: None ordered If you have labs (blood work) drawn today and your tests are completely normal, you will receive your results only by: MyChart Message (if you have MyChart) OR A paper copy in the mail If you have any lab test that is abnormal or we need to change your treatment, we will call you to review the results.   Testing/Procedures: Your physician has requested that you have an echocardiogram. Echocardiography is a painless test that uses sound waves to create images of your heart. It provides your doctor with information about the size and shape of your heart and how well your heart's chambers and valves are working. You may receive an ultrasound enhancing agent through an IV if needed to better visualize your heart during the echo.This procedure takes approximately one hour. There are no restrictions for this procedure. This will take place at the [address].   Your physician has requested that you have a lower extremity segmental doppler. This will take place at [address] [facility], [address].    Follow-Up: At [facility], you and your health needs are our priority.  As part of our continuing mission to provide you with exceptional heart care, we have created designated Provider Care Teams.  These Care Teams include your primary Cardiologist (physician) and Advanced Practice Providers (APPs -  Physician Assistants and Nurse Practitioners) who all work together to provide you with the care you need, when you need it.  We recommend signing up for the patient portal called "MyChart".  Sign up information is provided on this After Visit Summary.  MyChart is used to connect with patients for Virtual Visits (Telemedicine).  Patients are able to view lab/test results, encounter notes, upcoming appointments, etc.   Non-urgent messages can be sent to your provider as well.   To learn more about what you can do with MyChart, go to [email or web address].    Your next appointment:   4 month(s)  Provider:   Dr. Kirke Corin

## 2022-12-24 NOTE — Progress Notes (Signed)
Cardiology Office Note   Date:  12/24/2022   ID:  Courtney Ayers, DOB 14-Jun-1945, MRN [id number]  PCP:  Ronnald Nian, MD  Cardiologist:   Lorine Bears, MD   No chief complaint on file.     History of Present Illness: Courtney Ayers is a 77 y.o. female who was referred by Dr. Francine Graven for evaluation of peripheral arterial disease and lower extremity edema. She has known history of COPD, tobacco use and hyperlipidemia.  She quit smoking in 2018.  She is currently on 2 L of oxygen.  She reports prolonged symptoms of lower extremity edema especially at the end of the day with chronic bluish discoloration both feet.  She is very limited by shortness of breath and experience claudication. No chest pain or orthopnea.    Past Medical History:  Diagnosis Date   Adenomatous colon polyp    Chronic respiratory failure (HCC)    Chronic rhinitis    COPD with emphysema (HCC)    Depression 2006   some anxiety recurs when she has stopped med in past   Deviated nasal septum    Hyperlipidemia    OA (osteoarthritis)    B THUMB, RIGHT > LEFT HIP, BACK   Osteopenia     Past Surgical History:  Procedure Laterality Date   BREAST ENHANCEMENT SURGERY  1978   implants placed '78, removed and replaced  approx '95 (Dr. Dub Amis)   COLONOSCOPY  08/2011   COSMETIC SURGERY     LIPOSUCTION; other facial cosmetic surgery   GANGLION CYST EXCISION     INGUINIAL HERNIA REPAIR  2004   right, and ganglion cyst removal   LIPOMA EXCISION     MELANOMA EXCISION  2013   R upper arm   NASAL SEPTUM SURGERY  1987   for deviated septum (Dr. Dub Amis)   TONSILLECTOMY AND ADENOIDECTOMY  1951   VIDEO BRONCHOSCOPY Bilateral 08/08/2015   Procedure: VIDEO BRONCHOSCOPY WITHOUT FLUORO;  Surgeon: Lupita Leash, MD;  Location: [facility] ENDOSCOPY;  Service: Cardiopulmonary;  Laterality: Bilateral;     Current Outpatient Medications  Medication Sig Dispense Refill   albuterol (PROVENTIL) (2.5 MG/3ML) 0.083%  nebulizer solution USE 1 VIAL VIA NEBULIZER EVERY 2 HOURS AS NEEDED FOR SHORTNESS OF BREATH 375 mL 5   albuterol (VENTOLIN HFA) 108 (90 Base) MCG/ACT inhaler TAKE 2 PUFFS BY MOUTH EVERY 6 HOURS AS NEEDED FOR WHEEZE OR SHORTNESS OF BREATH 18 each 5   ALPRAZolam (XANAX) 0.5 MG tablet Take 1 tablet (0.5 mg total) by mouth at bedtime as needed for anxiety. 60 tablet 1   atorvastatin (LIPITOR) 10 MG tablet Take 1 tablet (10 mg total) by mouth daily. 90 tablet 3   BREZTRI AEROSPHERE 160-9-4.8 MCG/ACT AERO INHALE 2 PUFFS INTO THE LUNGS IN THE MORNING AND AT BEDTIME 10.7 each 11   busPIRone (BUSPAR) 7.5 MG tablet Take 1 tablet (7.5 mg total) by mouth 2 (two) times daily. 180 tablet 3   Calcium Carbonate-Vitamin D (CALCIUM-VITAMIN D) 500-200 MG-UNIT tablet Take 1 tablet by mouth daily.     citalopram (CELEXA) 10 MG tablet TAKE 1 TABLET BY MOUTH EVERY DAY 90 tablet 0   clindamycin (CLINDAGEL) 1 % gel Apply topically 2 (two) times daily. 30 g 5   Guaifenesin 1200 MG TB12 Take 1,200 mg by mouth 2 (two) times daily.      ibandronate (BONIVA) 150 MG tablet TAKE 1TAB BY MOUTH MONTHLY IN THE AM WITH FULL GLASS OF WATER,ON EMPTY STOMACH.DO NOT TAKE  ANYTHING ELSE OR LIE DOWN FOR 3 tablet 3   montelukast (SINGULAIR) 10 MG tablet TAKE 1 TABLET BY MOUTH EVERYDAY AT BEDTIME 90 tablet 1   OXYGEN Inhale 2 L into the lungs.     sodium chloride HYPERTONIC 3 % nebulizer solution USE 1 VIAL ( ) VIA NEBULIZER TWICE DAILY AS NEEDED. 360 mL 11   Spacer/Aero-Holding Chambers DEVI 1 Dose by Does not apply route 2 (two) times daily. 1 Dose 0   tretinoin (RETIN-A) 0.05 % cream Apply topically at bedtime. 45 g 0   fluticasone (FLONASE) 50 MCG/ACT nasal spray Place 2 sprays into both nostrils daily. 16 g 5   No current facility-administered medications for this visit.    Allergies:   Patient has no known allergies.    Social History:  The patient  reports that she quit smoking about 8 years ago. Her smoking use included  cigarettes. She started smoking about 53 years ago. She has a 45 pack-year smoking history. She has never used smokeless tobacco. She reports current alcohol use. She reports that she does not use drugs.   Family History:  The patient's family history includes Alcohol abuse in her father; Aortic aneurysm in her sister; Cancer in her maternal grandfather, mother, and sister; Cancer (age of onset: 31) in her maternal grandmother; Crohn's disease in her sister; Diabetes in her maternal uncle; Heart disease in her father and paternal grandfather; Hyperlipidemia in her father and sister; Hypertension in her father; Stroke in her paternal grandmother.    ROS:  Please see the history of present illness.   Otherwise, review of systems are positive for none.   All other systems are reviewed and negative.    PHYSICAL EXAM: VS:  BP 106/66 (BP Location: Left Arm, Patient Position: Sitting, Cuff Size: Normal)   Pulse 79   Ht 5' 7.5" (1.715 m)   Wt 156 lb 9.6 oz (71 kg)   LMP 06/10/1992   SpO2 97%   BMI 24.17 kg/m  , BMI Body mass index is 24.17 kg/m. GEN: Well nourished, well developed, in no acute distress  HEENT: normal  Neck: no JVD, carotid bruits, or masses Cardiac: RRR; no murmurs, rubs, or gallops, mild bilateral leg edema Respiratory:  clear to auscultation bilaterally but very diminished breath sounds bilaterally. GI: soft, nontender, nondistended, + BS MS: no deformity or atrophy  Skin: warm and dry, no rash Neuro:  Strength and sensation are intact Psych: euthymic mood, full affect Both feet are blue in color with palpable pulses bilaterally.  EKG:  EKG is ordered today. The ekg ordered today demonstrates :  Sinus rhythm with Premature atrial complexes Nonspecific T wave abnormality    Recent Labs: 03/04/2022: ALT 17; BUN 10; Creatinine, Ser 0.59; Hemoglobin 13.1; Platelets 211; Potassium 4.3; Sodium 142    Lipid Panel    Component Value Date/Time   CHOL 117 03/04/2022 1451    TRIG 75 03/04/2022 1451   HDL 69 03/04/2022 1451   CHOLHDL 1.7 03/04/2022 1451   LDLCALC 33 03/04/2022 1451      Wt Readings from Last 3 Encounters:  12/24/22 156 lb 9.6 oz (71 kg)  10/08/22 155 lb 9.6 oz (70.6 kg)  07/23/22 154 lb (69.9 kg)           No data to display            ASSESSMENT AND PLAN:  1.  Lower extremity edema: Suspect likely due to chronic venous insufficiency.  Previous venous Doppler in  2020 showed no evidence of DVT. I advised her to elevate her legs during the day and she will use the lounge doctor leg rest.  I also discussed knee-high support stockings but she had difficulty with dose in the past. Given significant associated shortness of breath, I requested an echocardiogram to ensure no cardiac involvement.  2.  Lower extremity numbness and bluish discoloration: I still think this is likely due to chronic venous insufficiency but we have to exclude peripheral arterial disease.  I requested lower extremity arterial Doppler.  She does have palpable distal pulses by exam.  3.  COPD: Seems to be stable when she quit smoking in 2018.  4.  Hyperlipidemia: Currently on atorvastatin.    Disposition:   FU with me in 4 months  Signed,  Lorine Bears, MD  12/24/2022 1:24 PM    [facility]

## 2022-12-30 ENCOUNTER — Other Ambulatory Visit: Payer: Self-pay | Admitting: Family Medicine

## 2022-12-30 ENCOUNTER — Other Ambulatory Visit (HOSPITAL_COMMUNITY): Payer: Self-pay | Admitting: Cardiovascular Disease

## 2022-12-30 DIAGNOSIS — I739 Peripheral vascular disease, unspecified: Secondary | ICD-10-CM

## 2022-12-31 ENCOUNTER — Ambulatory Visit (HOSPITAL_COMMUNITY): Admission: RE | Admit: 2022-12-31 | Payer: Medicare Other | Source: Ambulatory Visit

## 2023-01-09 ENCOUNTER — Ambulatory Visit (HOSPITAL_COMMUNITY)
Admission: RE | Admit: 2023-01-09 | Discharge: 2023-01-09 | Disposition: A | Discharge status: Home / Self Care | Payer: Medicare Other | Source: Ambulatory Visit | Attending: Cardiovascular Disease | Admitting: Cardiovascular Disease

## 2023-01-09 DIAGNOSIS — I739 Peripheral vascular disease, unspecified: Secondary | ICD-10-CM

## 2023-01-09 LAB — VAS US ABI WITH/WO TBI
Left ABI: 1.16
Right ABI: 1.2

## 2023-01-16 ENCOUNTER — Ambulatory Visit (HOSPITAL_COMMUNITY): Payer: Medicare Other

## 2023-01-27 ENCOUNTER — Ambulatory Visit (HOSPITAL_COMMUNITY): Payer: Medicare Other | Attending: Cardiovascular Disease

## 2023-01-27 DIAGNOSIS — R0602 Shortness of breath: Secondary | ICD-10-CM | POA: Diagnosis not present

## 2023-01-27 LAB — ECHOCARDIOGRAM COMPLETE
Area-P 1/2: 1.89 cm2
S' Lateral: 2.1 cm

## 2023-02-03 ENCOUNTER — Telehealth: Payer: Self-pay | Admitting: Family Medicine

## 2023-02-03 NOTE — Telephone Encounter (Signed)
Pt says her pharmacy is currently on back order for sodium chloride HYPERTONIC 3 % nebulizer solution but she can get it through [facility] and asks if a paper prescription can be emailed to her.

## 2023-02-04 NOTE — Telephone Encounter (Signed)
Pt called back and states it is actually 4ml not 5

## 2023-02-04 NOTE — Telephone Encounter (Signed)
Rx emailed to pt at [email or web address]

## 2023-02-10 ENCOUNTER — Other Ambulatory Visit: Payer: Self-pay | Admitting: Family Medicine

## 2023-02-10 DIAGNOSIS — F411 Generalized anxiety disorder: Secondary | ICD-10-CM

## 2023-02-11 NOTE — Telephone Encounter (Signed)
Is this okay to refill? 

## 2023-03-06 ENCOUNTER — Ambulatory Visit: Payer: Medicare Other | Admitting: Family Medicine

## 2023-03-11 ENCOUNTER — Ambulatory Visit (INDEPENDENT_AMBULATORY_CARE_PROVIDER_SITE_OTHER): Payer: Medicare Other | Admitting: Family Medicine

## 2023-03-11 ENCOUNTER — Encounter: Payer: Self-pay | Admitting: Family Medicine

## 2023-03-11 VITALS — BP 110/64 | HR 70 | Ht 68.0 in | Wt 158.0 lb

## 2023-03-11 DIAGNOSIS — M8580 Other specified disorders of bone density and structure, unspecified site: Secondary | ICD-10-CM | POA: Diagnosis not present

## 2023-03-11 DIAGNOSIS — Z85820 Personal history of malignant melanoma of skin: Secondary | ICD-10-CM

## 2023-03-11 DIAGNOSIS — D692 Other nonthrombocytopenic purpura: Secondary | ICD-10-CM | POA: Insufficient documentation

## 2023-03-11 DIAGNOSIS — J438 Other emphysema: Secondary | ICD-10-CM | POA: Diagnosis not present

## 2023-03-11 DIAGNOSIS — J9611 Chronic respiratory failure with hypoxia: Secondary | ICD-10-CM | POA: Diagnosis not present

## 2023-03-11 DIAGNOSIS — Z515 Encounter for palliative care: Secondary | ICD-10-CM | POA: Diagnosis not present

## 2023-03-11 DIAGNOSIS — R739 Hyperglycemia, unspecified: Secondary | ICD-10-CM | POA: Diagnosis not present

## 2023-03-11 DIAGNOSIS — I700 Atherosclerosis of aorta: Secondary | ICD-10-CM | POA: Diagnosis not present

## 2023-03-11 DIAGNOSIS — I10 Essential (primary) hypertension: Secondary | ICD-10-CM

## 2023-03-11 DIAGNOSIS — R5381 Other malaise: Secondary | ICD-10-CM | POA: Diagnosis not present

## 2023-03-11 DIAGNOSIS — Z66 Do not resuscitate: Secondary | ICD-10-CM

## 2023-03-11 DIAGNOSIS — F419 Anxiety disorder, unspecified: Secondary | ICD-10-CM

## 2023-03-11 DIAGNOSIS — R918 Other nonspecific abnormal finding of lung field: Secondary | ICD-10-CM | POA: Diagnosis not present

## 2023-03-11 DIAGNOSIS — Z23 Encounter for immunization: Secondary | ICD-10-CM

## 2023-03-11 MED ORDER — ATORVASTATIN CALCIUM 10 MG PO TABS
10.0000 mg | ORAL_TABLET | Freq: Every day | ORAL | 3 refills | Status: DC
Start: 2023-03-11 — End: 2024-03-03

## 2023-03-11 NOTE — Progress Notes (Signed)
Courtney Ayers is a 77 y.o. female who presents for annual wellness visit and follow-up on chronic medical conditions.  She has no particular concerns or complaints.  She continues to be followed very closely by Pulmonary.  Pulse ox in the low 90 range.  She has noted some purpuric lesions on her arms.  She continues on vitamin D and calcium for her osteopenia.  Continues on her blood pressure medications as well as pulmonary medicines.  Does have a previous history of melanoma but has not been seen in the last several years by dermatology.  Psychologically she seems to doing quite well.  Immunizations and Health Maintenance Immunization History  Administered Date(s) Administered   Fluad Quad(high Dose 65+) 02/25/2019, 02/28/2020, 04/11/2021, 04/04/2022   Fluad Trivalent(High Dose 65+) 03/11/2023   Influenza Split 07/03/2011, 02/14/2012   Influenza Whole 05/16/2004   Influenza, High Dose Seasonal PF 03/18/2013, 03/24/2014, 03/08/2015, 03/27/2018   Influenza,inj,Quad PF,6+ Mos 03/21/2016, 05/07/2017   Influenza-Unspecified 03/27/2018   PFIZER(Purple Top)SARS-COV-2 Vaccination 08/03/2019, 08/24/2019, 02/21/2020   PPD Test 05/30/1999   Pfizer Covid-19 Vaccine Bivalent Booster 83yrs & up 02/28/2021   Pfizer(Comirnaty)Fall Seasonal Vaccine 12 years and older 03/11/2023   Pneumococcal Conjugate-13 03/24/2014, 02/12/2019   Pneumococcal Polysaccharide-23 08/06/2000, 01/30/2011   Td 09/23/2001   Tdap 01/30/2011, 08/16/2021   Zoster Recombinant(Shingrix) 12/20/2016, 03/27/2018, 03/27/2018   Zoster, Live 03/05/2011   There are no preventive care reminders to display for this patient.   Last Pap smear: June 2011 Last mammogram: March 2018 Last colonoscopy: March 2013 Last DEXA: March 2023 Dentist: Dr. Cheral Bay Ophtho: Dr. Manning Charity Exercise: unable due to health conditions   Other doctors caring for patient include: Dr. Lorine Bears, Cardiology Dr. Melody Comas  Advanced  directives: Living Will and Power of Attorney    Depression screen:  See questionnaire below.     03/04/2022    2:03 PM 02/28/2021    2:32 PM 02/28/2020    2:04 PM 02/25/2019    2:17 PM 09/24/2018    3:31 PM  Depression screen PHQ 2/9  Decreased Interest 0 0 0 0 0  Down, Depressed, Hopeless 0 0 0 0 0  PHQ - 2 Score 0 0 0 0 0    Fall Risk Screen: see questionnaire below.    03/11/2023    2:48 PM 03/04/2022    2:02 PM 02/25/2022   11:22 AM 02/28/2021    2:31 PM 02/28/2020    2:02 PM  Fall Risk   Falls in the past year? 0 1 1 0 0  Number falls in past yr: 0 0 0 0   Comment  did cut arm a little bit     Injury with Fall? 0 1 1 0   Risk for fall due to :  History of fall(s)  No Fall Risks   Follow up  Falls evaluation completed  Falls evaluation completed     ADL screen:  See questionnaire below Functional Status Survey: Is the patient deaf or have difficulty hearing?: No Does the patient have difficulty seeing, even when wearing glasses/contacts?: No Does the patient have difficulty concentrating, remembering, or making decisions?: No Does the patient have difficulty walking or climbing stairs?: Yes (shortness of breath) Does the patient have difficulty dressing or bathing?: Yes (have to be careful when bathing getting in and out of tub) Does the patient have difficulty doing errands alone such as visiting a doctor's office or shopping?: Yes (does not drive)   Review of Systems Constitutional: -, -  unexpected weight change, -anorexia, -fatigue Allergy: -sneezing, -itching, -congestion Dermatology: denies changing moles, rash, lumps ENT: -runny nose, -ear pain, -sore throat,  Cardiology:  -chest pain, -palpitations, -orthopnea, Respiratory: -cough, -shortness of breath, -dyspnea on exertion, -wheezing,  Gastroenterology: -abdominal pain, -nausea, -vomiting, -diarrhea, -constipation, -dysphagia Hematology: -bleeding or bruising problems Musculoskeletal: -arthralgias, -myalgias,  -joint swelling, -back pain, - Ophthalmology: -vision changes,  Urology: -dysuria, -difficulty urinating,  -urinary frequency, -urgency, incontinence Neurology: -, -numbness, , -memory loss, -falls, -dizziness    PHYSICAL EXAM:  BP 110/64   Pulse 70   Ht 5\' 8"  (1.727 m)   Wt 158 lb (71.7 kg)   LMP 06/10/1992   SpO2 96%   BMI 24.02 kg/m   General Appearance: Alert, cooperative, no distress, appears stated age Head: Normocephalic, without obvious abnormality, atraumatic Eyes: PERRL, conjunctiva/corneas clear, EOM's intact,  Ears: Normal TM's and external ear canals Nose: Nares normal, mucosa normal, no drainage or sinus tenderness Throat: Lips, mucosa, and tongue normal; teeth and gums normal Neck: Supple, no lymphadenopathy;  thyroid:  no enlargement/tenderness/nodules; no carotid bruit or JVD Lungs: Clear to auscultation bilaterally without wheezes, rales or ronchi; respirations unlabored Heart: Regular rate and rhythm, S1 and S2 normal, no murmur, rubor gallop Skin:  Skin color, texture, turgor normal, purpuric lesions noted.  Lymph nodes: Cervical, supraclavicular, and axillary nodes normal Neurologic:  CNII-XII intact, normal strength, sensation and gait; reflexes 2+ and symmetric throughout Psych: Normal mood, affect, hygiene and grooming.  ASSESSMENT/PLAN: Chronic respiratory failure with hypoxia (HCC)  Palliative care by specialist  Osteopenia, unspecified location - Plan: CBC with Differential/Platelet, Comprehensive metabolic panel  History of melanoma - over 10 years  Primary hypertension - Plan: CBC with Differential/Platelet, Comprehensive metabolic panel  Multiple pulmonary nodules  Hyperglycemia  DNR (do not resuscitate)  Atherosclerosis of aorta (HCC) - Plan: atorvastatin (LIPITOR) 10 MG tablet, Lipid panel  Anxiety  Other emphysema (HCC)  Senile purpura (HCC)  Need for COVID-19 vaccine - Plan: Pfizer Comirnaty Covid -19 Vaccine 23yrs and  older  Need for influenza vaccination - Plan: Flu Vaccine Trivalent High Dose (Fluad)  Debility At the present time I think she is doing quite well.  She will continue on her present medication regimen.  Immunizations were given.  Also discussed the senile purpura with her.    Medicare Attestation I have personally reviewed: The patient's medical and social history Their use of alcohol, tobacco or illicit drugs Their current medications and supplements The patient's functional ability including ADLs,fall risks, home safety risks, cognitive, and hearing and visual impairment Diet and physical activities Evidence for depression or mood disorders  The patient's weight, height, and BMI have been recorded in the chart.  I have made referrals, counseling, and provided education to the patient based on review of the above and I have provided the patient with a written personalized care plan for preventive services.     Sharlot Gowda, MD   03/11/2023

## 2023-03-12 ENCOUNTER — Other Ambulatory Visit: Payer: Self-pay | Admitting: Family Medicine

## 2023-03-12 DIAGNOSIS — F411 Generalized anxiety disorder: Secondary | ICD-10-CM

## 2023-03-12 LAB — COMPREHENSIVE METABOLIC PANEL
ALT: 22 IU/L (ref 0–32)
AST: 26 IU/L (ref 0–40)
Albumin: 4.2 g/dL (ref 3.8–4.8)
Alkaline Phosphatase: 79 IU/L (ref 44–121)
BUN/Creatinine Ratio: 17 (ref 12–28)
BUN: 11 mg/dL (ref 8–27)
Bilirubin Total: 0.5 mg/dL (ref 0.0–1.2)
CO2: 31 mmol/L — ABNORMAL HIGH (ref 20–29)
Calcium: 8.8 mg/dL (ref 8.7–10.3)
Chloride: 102 mmol/L (ref 96–106)
Creatinine, Ser: 0.63 mg/dL (ref 0.57–1.00)
Globulin, Total: 1.5 g/dL (ref 1.5–4.5)
Glucose: 86 mg/dL (ref 70–99)
Potassium: 3.8 mmol/L (ref 3.5–5.2)
Sodium: 144 mmol/L (ref 134–144)
Total Protein: 5.7 g/dL — ABNORMAL LOW (ref 6.0–8.5)
eGFR: 91 mL/min/{1.73_m2} (ref >59)

## 2023-03-12 LAB — CBC WITH DIFFERENTIAL/PLATELET
Basophils Absolute: 0 10*3/uL (ref 0.0–0.2)
Basos: 0 %
EOS (ABSOLUTE): 0 10*3/uL (ref 0.0–0.4)
Eos: 0 %
Hematocrit: 40 % (ref 34.0–46.6)
Hemoglobin: 13 g/dL (ref 11.1–15.9)
Immature Grans (Abs): 0 10*3/uL (ref 0.0–0.1)
Immature Granulocytes: 0 %
Lymphocytes Absolute: 1.3 10*3/uL (ref 0.7–3.1)
Lymphs: 23 %
MCH: 31 pg (ref 26.6–33.0)
MCHC: 32.5 g/dL (ref 31.5–35.7)
MCV: 96 fL (ref 79–97)
Monocytes Absolute: 0.4 10*3/uL (ref 0.1–0.9)
Monocytes: 8 %
Neutrophils Absolute: 4 10*3/uL (ref 1.4–7.0)
Neutrophils: 69 %
Platelets: 172 10*3/uL (ref 150–450)
RBC: 4.19 x10E6/uL (ref 3.77–5.28)
RDW: 12.4 % (ref 11.7–15.4)
WBC: 5.8 10*3/uL (ref 3.4–10.8)

## 2023-03-12 LAB — LIPID PANEL
Cholesterol, Total: 128 mg/dL (ref 100–199)
HDL: 89 mg/dL (ref >39)
LDL CALC COMMENT:: 1.4 ratio (ref 0.0–4.4)
LDL Chol Calc (NIH): 26 mg/dL (ref 0–99)
Triglycerides: 61 mg/dL (ref 0–149)
VLDL Cholesterol Cal: 13 mg/dL (ref 5–40)

## 2023-04-28 ENCOUNTER — Telehealth: Payer: Self-pay | Admitting: Cardiovascular Disease

## 2023-04-28 NOTE — Telephone Encounter (Signed)
Patient has been made aware that her visit has been changed to a MyChart visit.   Ms. taranika, marra are scheduled for a virtual visit with your provider today.    Just as we do with appointments in the office, we must obtain your consent to participate.  Your consent will be active for this visit and any virtual visit you may have with one of our providers in the next 365 days.    If you have a MyChart account, I can also send a copy of this consent to you electronically.  All virtual visits are billed to your insurance company just like a traditional visit in the office.  As this is a virtual visit, video technology does not allow for your provider to perform a traditional examination.  This may limit your provider's ability to fully assess your condition.  If your provider identifies any concerns that need to be evaluated in person or the need to arrange testing such as labs, EKG, etc, we will make arrangements to do so.    Although advances in technology are sophisticated, we cannot ensure that it will always work on either your end or our end.  If the connection with a video visit is poor, we may have to switch to a telephone visit.  With either a video or telephone visit, we are not always able to ensure that we have a secure connection.   I need to obtain your verbal consent now.   Are you willing to proceed with your visit today?   JACKELYN VANNEST has provided verbal consent on 04/28/2023 for a virtual visit (video or telephone).

## 2023-04-28 NOTE — Telephone Encounter (Signed)
Patient calling to see if the appt can be virtual on tomorrow. Please advise

## 2023-04-29 ENCOUNTER — Encounter: Payer: Self-pay | Admitting: Cardiovascular Disease

## 2023-04-29 ENCOUNTER — Ambulatory Visit: Payer: Medicare Other | Attending: Cardiovascular Disease | Admitting: Cardiovascular Disease

## 2023-04-29 ENCOUNTER — Telehealth: Payer: Self-pay

## 2023-04-29 VITALS — BP 159/93 | HR 74 | Ht 67.0 in | Wt 158.0 lb

## 2023-04-29 DIAGNOSIS — I872 Venous insufficiency (chronic) (peripheral): Secondary | ICD-10-CM | POA: Insufficient documentation

## 2023-04-29 DIAGNOSIS — E785 Hyperlipidemia, unspecified: Secondary | ICD-10-CM | POA: Diagnosis not present

## 2023-04-29 NOTE — Telephone Encounter (Signed)
  Patient Consent for Virtual Visit        Courtney Ayers has provided verbal consent on 04/29/2023 for a virtual visit (video or telephone).   CONSENT FOR VIRTUAL VISIT FOR:  Courtney Ayers  By participating in this virtual visit I agree to the following:  I hereby voluntarily request, consent and authorize [facility] and its employed or contracted physicians, physician assistants, nurse practitioners or other licensed health care professionals (the Practitioner), to provide me with telemedicine health care services (the "Services") as deemed necessary by the treating Practitioner. I acknowledge and consent to receive the Services by the Practitioner via telemedicine. I understand that the telemedicine visit will involve communicating with the Practitioner through live audiovisual communication technology and the disclosure of certain medical information by electronic transmission. I acknowledge that I have been given the opportunity to request an in-person assessment or other available alternative prior to the telemedicine visit and am voluntarily participating in the telemedicine visit.  I understand that I have the right to withhold or withdraw my consent to the use of telemedicine in the course of my care at any time, without affecting my right to future care or treatment, and that the Practitioner or I may terminate the telemedicine visit at any time. I understand that I have the right to inspect all information obtained and/or recorded in the course of the telemedicine visit and may receive copies of available information for a reasonable fee.  I understand that some of the potential risks of receiving the Services via telemedicine include:  Delay or interruption in medical evaluation due to technological equipment failure or disruption; Information transmitted may not be sufficient (e.g. poor resolution of images) to allow for appropriate medical decision making by the  Practitioner; and/or  In rare instances, security protocols could fail, causing a breach of personal health information.  Furthermore, I acknowledge that it is my responsibility to provide information about my medical history, conditions and care that is complete and accurate to the best of my ability. I acknowledge that Practitioner's advice, recommendations, and/or decision may be based on factors not within their control, such as incomplete or inaccurate data provided by me or distortions of diagnostic images or specimens that may result from electronic transmissions. I understand that the practice of medicine is not an exact science and that Practitioner makes no warranties or guarantees regarding treatment outcomes. I acknowledge that a copy of this consent can be made available to me via my patient portal [facility] MyChart), or I can request a printed copy by calling the office of [facility].    I understand that my insurance will be billed for this visit.   I have read or had this consent read to me. I understand the contents of this consent, which adequately explains the benefits and risks of the Services being provided via telemedicine.  I have been provided ample opportunity to ask questions regarding this consent and the Services and have had my questions answered to my satisfaction. I give my informed consent for the services to be provided through the use of telemedicine in my medical care

## 2023-04-29 NOTE — Progress Notes (Signed)
Virtual Visit via Video Note   Because of Courtney Ayers co-morbid illnesses, she is at least at moderate risk for complications without adequate follow up.  This format is felt to be most appropriate for this patient at this time.  All issues noted in this document were discussed and addressed.  A limited physical exam was performed with this format.  Please refer to the patient's chart for her consent to telehealth for [facility].       Date:  04/29/2023   ID:  Courtney Ayers, DOB Sep 03, 1945, MRN [id number] The patient was identified using 2 identifiers.  Patient Location: Home Provider Location: Office/Clinic   PCP:  Ronnald Nian, MD   [facility] Providers Cardiologist:  None     Evaluation Performed:  Follow-Up Visit  Chief Complaint: Lower extremity edema  History of Present Illness:    Courtney Ayers is a 77 y.o. female who was seen via video visit for a follow-up visit regarding chronic venous insufficiency. She has known history of COPD, tobacco use and hyperlipidemia.  She quit smoking in 2018.  She is currently on 2 L of oxygen.  She was seen recently for bilateral lower extremity edema worse on the left side with chronic bluish discoloration both feet.  She had previous venous Doppler that showed no evidence of DVT.  Due to cyanosis, she underwent arterial Doppler which showed normal ABI bilaterally.  Echocardiogram showed normal LV systolic function with no evidence of pulmonary hypertension. She has difficulty applying support stockings but does elevate her legs frequently.    Past Medical History:  Diagnosis Date   Adenomatous colon polyp    Chronic respiratory failure (HCC)    Chronic rhinitis    COPD with emphysema (HCC)    Depression 2006   some anxiety recurs when she has stopped med in past   Deviated nasal septum    Hyperlipidemia    OA (osteoarthritis)    B THUMB, RIGHT > LEFT HIP, BACK   Osteopenia    Past  Surgical History:  Procedure Laterality Date   BREAST ENHANCEMENT SURGERY  1978   implants placed '78, removed and replaced  approx '95 (Dr. Dub Amis)   COLONOSCOPY  08/2011   COSMETIC SURGERY     LIPOSUCTION; other facial cosmetic surgery   GANGLION CYST EXCISION     INGUINIAL HERNIA REPAIR  2004   right, and ganglion cyst removal   LIPOMA EXCISION     MELANOMA EXCISION  2013   R upper arm   NASAL SEPTUM SURGERY  1987   for deviated septum (Dr. Dub Amis)   TONSILLECTOMY AND ADENOIDECTOMY  1951   VIDEO BRONCHOSCOPY Bilateral 08/08/2015   Procedure: VIDEO BRONCHOSCOPY WITHOUT FLUORO;  Surgeon: Lupita Leash, MD;  Location: [facility] ENDOSCOPY;  Service: Cardiopulmonary;  Laterality: Bilateral;     Current Meds  Medication Sig   albuterol (PROVENTIL) (2.5 MG/3ML) 0.083% nebulizer solution USE 1 VIAL VIA NEBULIZER EVERY 2 HOURS AS NEEDED FOR SHORTNESS OF BREATH   albuterol (VENTOLIN HFA) 108 (90 Base) MCG/ACT inhaler TAKE 2 PUFFS BY MOUTH EVERY 6 HOURS AS NEEDED FOR WHEEZE OR SHORTNESS OF BREATH   ALPRAZolam (XANAX) 0.5 MG tablet Take 1 tablet (0.5 mg total) by mouth at bedtime as needed for anxiety.   atorvastatin (LIPITOR) 10 MG tablet Take 1 tablet (10 mg total) by mouth daily.   BREZTRI AEROSPHERE 160-9-4.8 MCG/ACT AERO INHALE 2 PUFFS INTO THE LUNGS IN THE MORNING AND AT BEDTIME  busPIRone (BUSPAR) 7.5 MG tablet TAKE 1 TABLET BY MOUTH 2 TIMES DAILY.   Calcium Carbonate-Vitamin D (CALCIUM-VITAMIN D) 500-200 MG-UNIT tablet Take 1 tablet by mouth daily.   citalopram (CELEXA) 10 MG tablet TAKE 1 TABLET BY MOUTH EVERY DAY   clindamycin (CLINDAGEL) 1 % gel Apply topically 2 (two) times daily.   Guaifenesin 1200 MG TB12 Take 1,200 mg by mouth 2 (two) times daily.    ibandronate (BONIVA) 150 MG tablet TAKE 1TAB BY MOUTH MONTHLY IN THE AM WITH FULL GLASS OF WATER,ON EMPTY STOMACH.DO NOT TAKE ANYTHING ELSE OR LIE DOWN FOR   montelukast (SINGULAIR) 10 MG tablet TAKE 1 TABLET BY MOUTH  EVERYDAY AT BEDTIME   OXYGEN Inhale 2 L into the lungs.   sodium chloride HYPERTONIC 3 % nebulizer solution USE 1 VIAL ( ) VIA NEBULIZER TWICE DAILY AS NEEDED.   Spacer/Aero-Holding Chambers DEVI 1 Dose by Does not apply route 2 (two) times daily.   tretinoin (RETIN-A) 0.05 % cream Apply topically at bedtime.     Allergies:   Patient has no known allergies.   Social History   Tobacco Use   Smoking status: Former    Current packs/day: 0.00    Average packs/day: 1 pack/day for 45.0 years (45.0 ttl pk-yrs)    Types: Cigarettes    Start date: 08/05/1969    Quit date: 08/05/2014    Years since quitting: 8.7   Smokeless tobacco: Never  Vaping Use   Vaping status: Never Used  Substance Use Topics   Alcohol use: Yes    Alcohol/week: 0.0 standard drinks of alcohol    Comment: 2 drinks per night   Drug use: No     Family Hx: The patient's family history includes Alcohol abuse in her father; Aortic aneurysm in her sister; Cancer in her maternal grandfather, mother, and sister; Cancer (age of onset: 58) in her maternal grandmother; Crohn's disease in her sister; Diabetes in her maternal uncle; Heart disease in her father and paternal grandfather; Hyperlipidemia in her father and sister; Hypertension in her father; Stroke in her paternal grandmother.  ROS:   Please see the history of present illness.     All other systems reviewed and are negative.   Prior CV studies:   The following studies were reviewed today:  I reviewed the results of the recent echocardiogram and lower extremity arterial Doppler with her.  Labs/Other Tests and Data Reviewed:    EKG:  No ECG reviewed.  Recent Labs: 03/11/2023: ALT 22; BUN 11; Creatinine, Ser 0.63; Hemoglobin 13.0; Platelets 172; Potassium 3.8; Sodium 144   Recent Lipid Panel Lab Results  Component Value Date/Time   CHOL 128 03/11/2023 03:55 PM   TRIG 61 03/11/2023 03:55 PM   HDL 89 03/11/2023 03:55 PM   CHOLHDL 1.4 03/11/2023 03:55 PM    LDLCALC 26 03/11/2023 03:55 PM    Wt Readings from Last 3 Encounters:  04/29/23 158 lb (71.7 kg)  03/11/23 158 lb (71.7 kg)  12/24/22 156 lb 9.6 oz (71 kg)     Risk Assessment/Calculations:          Objective:    Vital Signs:  BP (!) 159/93 (BP Location: Left Arm, Patient Position: Sitting, Cuff Size: Normal)   Pulse 74   Ht 5\' 7"  (1.702 m)   Wt 158 lb (71.7 kg)   LMP 06/10/1992   SpO2 96%   BMI 24.75 kg/m    VITAL SIGNS:  reviewed GEN:  no acute distress  ASSESSMENT & PLAN:  1.  Chronic venous insufficiency : Recommend continuing leg elevation using the lounge doctor leg rest.  She is not able to apply knee-high support stockings and has no one to help her with this.   If no improvement in symptoms with these measures, we will recommend a lymphedema pump.   2.  Lower extremity numbness and bluish discoloration: This also seems to be due to chronic venous insufficiency.  She had palpable pulses by exam and ABI was normal bilaterally.   3.  COPD: Seems to be stable when she quit smoking in 2018.   4.  Hyperlipidemia: Currently on atorvastatin.            Time:   Today, I have spent 20 minutes with the patient with telehealth technology discussing the above problems.     Medication Adjustments/Labs and Tests Ordered: Current medicines are reviewed at length with the patient today.  Concerns regarding medicines are outlined above.   Tests Ordered: No orders of the defined types were placed in this encounter.   Medication Changes: No orders of the defined types were placed in this encounter.   Follow Up:  In Person in 6 month(s)  Signed, Lorine Bears, MD  04/29/2023 10:27 AM    [facility]

## 2023-04-29 NOTE — Patient Instructions (Addendum)
Medication Instructions:  No changes *If you need a refill on your cardiac medications before your next appointment, please call your pharmacy*   Lab Work: None ordered If you have labs (blood work) drawn today and your tests are completely normal, you will receive your results only by: MyChart Message (if you have MyChart) OR A paper copy in the mail If you have any lab test that is abnormal or we need to change your treatment, we will call you to review the results.   Testing/Procedures: Here is the link to the Lounge Doctor: [email or web address]   Follow-Up: At [facility], you and your health needs are our priority.  As part of our continuing mission to provide you with exceptional heart care, we have created designated Provider Care Teams.  These Care Teams include your primary Cardiologist (physician) and Advanced Practice Providers (APPs -  Physician Assistants and Nurse Practitioners) who all work together to provide you with the care you need, when you need it.  We recommend signing up for the patient portal called "MyChart".  Sign up information is provided on this After Visit Summary.  MyChart is used to connect with patients for Virtual Visits (Telemedicine).  Patients are able to view lab/test results, encounter notes, upcoming appointments, etc.  Non-urgent messages can be sent to your provider as well.   To learn more about what you can do with MyChart, go to [email or web address].    Your next appointment:   6 month(s)  Provider:   Dr. Kirke Corin

## 2023-05-17 ENCOUNTER — Other Ambulatory Visit: Payer: Self-pay | Admitting: Family Medicine

## 2023-05-17 DIAGNOSIS — M810 Age-related osteoporosis without current pathological fracture: Secondary | ICD-10-CM

## 2023-06-06 ENCOUNTER — Encounter: Payer: Self-pay | Admitting: Pulmonary Disease

## 2023-06-06 ENCOUNTER — Telehealth: Payer: Medicare Other | Admitting: Pulmonary Disease

## 2023-06-06 DIAGNOSIS — J9611 Chronic respiratory failure with hypoxia: Secondary | ICD-10-CM

## 2023-06-06 DIAGNOSIS — J310 Chronic rhinitis: Secondary | ICD-10-CM | POA: Diagnosis not present

## 2023-06-06 DIAGNOSIS — J449 Chronic obstructive pulmonary disease, unspecified: Secondary | ICD-10-CM | POA: Diagnosis not present

## 2023-06-06 MED ORDER — IPRATROPIUM-ALBUTEROL 0.5-2.5 (3) MG/3ML IN SOLN
3.0000 mL | Freq: Three times a day (TID) | RESPIRATORY_TRACT | 5 refills | Status: DC
Start: 2023-06-06 — End: 2023-06-18

## 2023-06-06 MED ORDER — BUDESONIDE 0.5 MG/2ML IN SUSP
0.5000 mg | Freq: Two times a day (BID) | RESPIRATORY_TRACT | 11 refills | Status: DC
Start: 2023-06-06 — End: 2023-06-18

## 2023-06-06 NOTE — Progress Notes (Signed)
Virtual Visit via Video Note  I connected with Courtney Ayers on 06/06/23 at  1:30 PM EST by a video enabled telemedicine application and verified that I am speaking with the correct person using two identifiers.  Location: Patient: home Provider: clinic   I discussed the limitations of evaluation and management by telemedicine and the availability of in person appointments. The patient expressed understanding and agreed to proceed.  History of Present Illness: Courtney Ayers is a 77 year old woman, former smoker with COPD and chronic hypoxemic respiratory failure who returns to pulmonary clinic for follow up.    She complains of exertional dyspnea and significant anxiety in the mornings and evening times. She has anxiety when she knows she has to exert herself a lot. She also complains of severe nasal congestion that makes it feel like she is not getting her supplemental oxygen. She is using flonase, sinus rinses and breathe right strips.   She is using breztri 2 puffs twice daily.   Observations/Objective: Elderly woman Nasal canula in place Pursed lip breathing at times  Assessment and Plan: COPD Chronic Hypoxemic Respiratory Failure Chronic Rhinitis/Congestion  Plan: - Transition from inhaler to all nebulizer regimen. - Start budesonide 0.5mg  twice daily - Start duoneb treatments 3 times per day - Use as needed albuterol - Stop breztri inhaler - Refer to ENT for further evaluation of her chronic nasal/sinus congestion - Will consider palliative care referral if her dyspnea and anxiety symptoms do not improve  Follow Up Instructions:  Follow up in 3 months.  I discussed the assessment and treatment plan with the patient. The patient was provided an opportunity to ask questions and all were answered. The patient agreed with the plan and demonstrated an understanding of the instructions.   The patient was advised to call back or seek an in-person evaluation if the symptoms  worsen or if the condition fails to improve as anticipated.  I provided 30 minutes of non-face-to-face time during this encounter.   Martina Sinner, MD

## 2023-06-06 NOTE — Patient Instructions (Addendum)
Start budesonide nebulizer twice a day  Start duoneb nebulizer treatments 3 times per day  Stop breztri inhaler  We will refer you to ENT for your nasal congestion  We will consider palliative care consult in the future

## 2023-06-12 ENCOUNTER — Telehealth: Payer: Self-pay | Admitting: Pulmonary Disease

## 2023-06-12 DIAGNOSIS — J449 Chronic obstructive pulmonary disease, unspecified: Secondary | ICD-10-CM

## 2023-06-12 NOTE — Telephone Encounter (Signed)
 Patient states does not want to use the Nebulizer machine. States would like to start back with the [facility] inhaler. Patient phone number is [phone] and [phone].

## 2023-06-17 NOTE — Telephone Encounter (Signed)
 Patient doesn't want to continue with nebulizer solution. She would like to remain on Breztri .Patient states this is causing more anxiety trying to keep up with neb txt several times a day.  Assessment and Plan: COPD Chronic Hypoxemic Respiratory Failure Chronic Rhinitis/Congestion   Plan: - Transition from inhaler to all nebulizer regimen. - Start budesonide  0.5mg  twice daily - Start duoneb treatments 3 times per day - Use as needed albuterol  - Stop breztri  inhaler - Refer to ENT for further evaluation of her chronic nasal/sinus congestion - Will consider palliative care referral if her dyspnea and anxiety symptoms do not improve

## 2023-06-18 MED ORDER — BREZTRI AEROSPHERE 160-9-4.8 MCG/ACT IN AERO
2.0000 | INHALATION_SPRAY | Freq: Two times a day (BID) | RESPIRATORY_TRACT | 6 refills | Status: DC
Start: 2023-06-18 — End: 2024-02-23

## 2023-06-18 NOTE — Telephone Encounter (Signed)
 Ok, breztri prescription sent to pharmacy.  JD

## 2023-06-19 NOTE — Telephone Encounter (Signed)
Patient notified.NFN

## 2023-07-16 ENCOUNTER — Other Ambulatory Visit: Payer: Self-pay | Admitting: Medical

## 2023-07-16 ENCOUNTER — Other Ambulatory Visit: Payer: Self-pay | Admitting: Family Medicine

## 2023-07-16 MED ORDER — ALBUTEROL SULFATE HFA 108 (90 BASE) MCG/ACT IN AERS: 2.0000 | INHALATION_SPRAY | Freq: Four times a day (QID) | RESPIRATORY_TRACT | 0 refills | Status: DC | PRN

## 2023-07-16 NOTE — Telephone Encounter (Signed)
 Called the pharmacy, they were able to figure it out. Told me to delete/ deny this request.

## 2023-07-17 ENCOUNTER — Institutional Professional Consult (permissible substitution) (INDEPENDENT_AMBULATORY_CARE_PROVIDER_SITE_OTHER): Payer: Medicare Other

## 2023-07-17 NOTE — Progress Notes (Deleted)
 Dear Dr. Kara, Here is my assessment for our mutual patient, Courtney Ayers. Thank you for allowing me the opportunity to care for your patient. Please do not hesitate to contact me should you have any other questions. Sincerely, Dr. Eldora Blanch  Otolaryngology Clinic Note Referring provider: Dr. Kara HPI:  Courtney Ayers is a 78 y.o. female kindly referred by Dr. Kara for evaluation of ***  H&N Surgery: *** Personal or FHx of bleeding dz or anesthesia difficulty: no ***  GLP-1: *** AP/AC: ***  Tobacco: quit (former smoker). Alcohol: ***. Occupation: ***. Lives in *** with ***.  PMHx: COPD, Chronic respiratory failure, Melanoma, HTN, Aortic atherosclerosis, Anxiety, Venous insufficiency  Independent Review of Additional Tests or Records:  Dr. Kara Stann) - 06/06/2023: Noted dyspnea and anxiety. She reports that she has severe nasal congestion that she feels like she's not getting her supplemental O2; using flonase , rinses, breathe right strips. Dx: Nasal congesiton, Rhinitis; Rx: Ref ENT for sinus congestion Labs: CBC and CMP 03/11/2023: WBC 5.8, Plt 172, Hgb 13, Eos 0; CMP CO2 31, but no kidney marker elevation or liver marker elevation CT Chest ***  PMH/Meds/All/SocHx/FamHx/ROS:   Past Medical History:  Diagnosis Date   Courtney Ayers    Chronic respiratory failure (HCC)    Chronic rhinitis    COPD with emphysema (HCC)    Depression 2006   some anxiety recurs when she has stopped med in past   Deviated nasal septum    Hyperlipidemia    OA (osteoarthritis)    B THUMB, RIGHT > LEFT HIP, BACK   Osteopenia      Past Surgical History:  Procedure Laterality Date   BREAST ENHANCEMENT SURGERY  1978   implants placed '78, removed and replaced  approx '95 (Dr. Emery)   COLONOSCOPY  08/2011   COSMETIC SURGERY     LIPOSUCTION; other facial cosmetic surgery   GANGLION CYST EXCISION     INGUINIAL HERNIA REPAIR  2004   right, and ganglion cyst removal    LIPOMA EXCISION     MELANOMA EXCISION  2013   R upper arm   NASAL SEPTUM SURGERY  1987   for deviated septum (Dr. Emery)   TONSILLECTOMY AND ADENOIDECTOMY  1951   VIDEO BRONCHOSCOPY Bilateral 08/08/2015   Procedure: VIDEO BRONCHOSCOPY WITHOUT FLUORO;  Surgeon: Vicenta KATHEE Lennert, MD;  Location: [facility] ENDOSCOPY;  Service: Cardiopulmonary;  Laterality: Bilateral;    Family History  Problem Relation Age of Onset   Cancer Mother        bladder, metastatic   Alcohol abuse Father    Heart disease Father        CHF   Hyperlipidemia Father    Hypertension Father    Aortic aneurysm Sister    Crohn's disease Sister    Hyperlipidemia Sister    Cancer Sister        brain cancer in her 16's   Diabetes Maternal Uncle    Cancer Maternal Grandmother 53       pancreatic   Cancer Maternal Grandfather        bladder cancer in 47's   Stroke Paternal Grandmother    Heart disease Paternal Grandfather        MI later 51's, early 5's     Social Connections: Not on file      Current Outpatient Medications:    albuterol  (VENTOLIN  HFA) 108 (90 Base) MCG/ACT inhaler, TAKE 2 PUFFS BY MOUTH EVERY 6 HOURS AS NEEDED FOR WHEEZE OR SHORTNESS OF  BREATH, Disp: 18 each, Rfl: 5   albuterol  (VENTOLIN  HFA) 108 (90 Base) MCG/ACT inhaler, Inhale 2 puffs into the lungs every 6 (six) hours as needed for wheezing or shortness of breath., Disp: 18 g, Rfl: 0   ALPRAZolam  (XANAX ) 0.5 MG tablet, Take 1 tablet (0.5 mg total) by mouth at bedtime as needed for anxiety., Disp: 60 tablet, Rfl: 1   atorvastatin  (LIPITOR) 10 MG tablet, Take 1 tablet (10 mg total) by mouth daily., Disp: 90 tablet, Rfl: 3   Budeson-Glycopyrrol-Formoterol  (BREZTRI  AEROSPHERE) 160-9-4.8 MCG/ACT AERO, Inhale 2 puffs into the lungs in the morning and at bedtime., Disp: 10.7 g, Rfl: 6   busPIRone  (BUSPAR ) 7.5 MG tablet, TAKE 1 TABLET BY MOUTH 2 TIMES DAILY., Disp: 180 tablet, Rfl: 3   Calcium  Carbonate-Vitamin D  (CALCIUM -VITAMIN D ) 500-200 MG-UNIT  tablet, Take 1 tablet by mouth daily., Disp: , Rfl:    citalopram  (CELEXA ) 10 MG tablet, TAKE 1 TABLET BY MOUTH EVERY DAY, Disp: 90 tablet, Rfl: 3   clindamycin  (CLINDAGEL) 1 % gel, Apply topically 2 (two) times daily., Disp: 30 g, Rfl: 5   fluticasone  (FLONASE ) 50 MCG/ACT nasal spray, Place 2 sprays into both nostrils daily., Disp: 16 g, Rfl: 5   Guaifenesin  1200 MG TB12, Take 1,200 mg by mouth 2 (two) times daily. , Disp: , Rfl:    ibandronate  (BONIVA ) 150 MG tablet, TAKE 1 TAB BY MOUTH MONTHLY W/FULL GLASS OF WATER ON EMPTY STOMACH. NO FOOD/DRINK/LYING DOWN X30 MIN, Disp: 3 tablet, Rfl: 3   montelukast  (SINGULAIR ) 10 MG tablet, TAKE 1 TABLET BY MOUTH EVERYDAY AT BEDTIME, Disp: 90 tablet, Rfl: 1   OXYGEN , Inhale 2 L into the lungs., Disp: , Rfl:    sodium chloride  HYPERTONIC 3 % nebulizer solution, USE 1 VIAL ( ) VIA NEBULIZER TWICE DAILY AS NEEDED., Disp: 360 mL, Rfl: 11   Spacer/Aero-Holding Chambers DEVI, 1 Dose by Does not apply route 2 (two) times daily., Disp: 1 Dose, Rfl: 0   tretinoin  (RETIN-A ) 0.05 % cream, Apply topically at bedtime., Disp: 45 g, Rfl: 0   Physical Exam:   LMP 06/10/1992   Salient findings:  CN II-XII intact *** Bilateral EAC clear and TM intact with well pneumatized middle ear spaces Weber 512: *** Rinne 512: AC > BC b/l *** Rine 1024: AC > BC b/l *** Anterior rhinoscopy: Septum ***; bilateral inferior turbinates with *** No lesions of oral cavity/oropharynx; dentition *** No obviously palpable neck masses/lymphadenopathy/thyromegaly No respiratory distress or stridor***  Seprately Identifiable Procedures:  None***  Impression & Plans:  Aunica Ayers is a 78 y.o. female with ***  No diagnosis found.   - f/u ***  See below regarding exact medications prescribed this encounter including dosages and route: No orders of the defined types were placed in this encounter.     Thank you for allowing me the opportunity to care for your patient. Please  do not hesitate to contact me should you have any other questions.  Sincerely, Eldora Blanch, MD Otolaryngologist (ENT), [facility] Phone: [phone] Fax: [phone]  07/17/2023, 12:54 PM   MDM:  Level *** Complexity/Problems addressed: *** Data complexity: *** independent review of *** - Morbidity: ***  - Prescription Drug prescribed or managed: ***

## 2023-07-22 ENCOUNTER — Telehealth: Payer: Self-pay | Admitting: Family Medicine

## 2023-07-22 MED ORDER — MONTELUKAST SODIUM 10 MG PO TABS: ORAL_TABLET | ORAL | 1 refills | Status: DC

## 2023-07-22 NOTE — Telephone Encounter (Signed)
Pt requesting a refill on montelukast to [id number] - [place], [place] - [address] AT [address] OF [facility] [address]

## 2023-07-23 ENCOUNTER — Telehealth (INDEPENDENT_AMBULATORY_CARE_PROVIDER_SITE_OTHER): Payer: Self-pay | Admitting: Otolaryngology

## 2023-07-23 NOTE — Telephone Encounter (Signed)
Left vm to confirm appt and address for 07/24/2023.

## 2023-07-24 ENCOUNTER — Ambulatory Visit (INDEPENDENT_AMBULATORY_CARE_PROVIDER_SITE_OTHER): Payer: Medicare Other | Admitting: Otolaryngology

## 2023-07-24 ENCOUNTER — Encounter (INDEPENDENT_AMBULATORY_CARE_PROVIDER_SITE_OTHER): Payer: Self-pay

## 2023-07-24 VITALS — BP 137/78 | HR 81

## 2023-07-24 DIAGNOSIS — J3489 Other specified disorders of nose and nasal sinuses: Secondary | ICD-10-CM

## 2023-07-24 DIAGNOSIS — T485X5A Adverse effect of other anti-common-cold drugs, initial encounter: Secondary | ICD-10-CM | POA: Diagnosis not present

## 2023-07-24 DIAGNOSIS — J34829 Nasal valve collapse, unspecified: Secondary | ICD-10-CM

## 2023-07-24 DIAGNOSIS — J342 Deviated nasal septum: Secondary | ICD-10-CM

## 2023-07-24 MED ORDER — AZELASTINE HCL 0.1 % NA SOLN: 2.0000 | Freq: Two times a day (BID) | NASAL | 12 refills | Status: AC

## 2023-07-24 NOTE — Patient Instructions (Addendum)
Use flonase two sprays each nostril twice per day Quit afrin Use astelin two sprays each nostril twice per day as needed AYR GEL - use multiple times per day for dry use Can also use two-drops of baby oil or ponaris oil each nostril for moisture Consider sleep study and Nasal CPAP

## 2023-07-24 NOTE — Progress Notes (Signed)
 Dear Dr. Susann Givens, Here is my assessment for our mutual patient, Courtney Ayers. Thank you for allowing me the opportunity to care for your patient. Please do not hesitate to contact me should you have any other questions. Sincerely, Dr. Jovita Kussmaul  Otolaryngology Clinic Note Referring provider: Dr. Susann Givens HPI:  Courtney Ayers is a 78 y.o. female kindly referred by Dr. Susann Givens for evaluation of nasal congestion.  Initial visit (07/2023): She reports that she has had history of severe nasal congestion which has been ongoing for several years, had septo/turbs years ago. Now has evening/night time congestion/obstruction (both) which is worsening. She wears breathe right strip and saline does rinses. Breathe right strip helps. Also uses nasal cones. She is concerned about nasal spray addiction since she uses afrin at night-time. She uses flonase daily. Nose is quite dry - hard crusts; She does have some mucoid drainage bilaterally and will blow her nose and will get some bloody crust if she blows hard. This is bothersome since she uses O2 nasal cannula for COPD (2L). No sinus infection - discolored drainage, pressure/pain in the nose, rhinorrhea. Just seasonal allergies.  H&N Surgery: Septoplasty and Turbinate reductions (30-40 years ago), Facelift Personal or FHx of bleeding dz or anesthesia difficulty: no  AP/AC: no  Tobacco: quit (former smoker). Lives in [place], [place]  PMHx: COPD, Chronic respiratory failure, Melanoma, HTN, Aortic atherosclerosis, Anxiety, Venous insufficiency  Independent Review of Additional Tests or Records:  Dr. Francine Graven Erline Hau) - 06/06/2023: Noted dyspnea and anxiety. She reports that she has severe nasal congestion that she feels like she's not getting her supplemental O2; using flonase, rinses, breathe right strips. Dx: Nasal congesiton, Rhinitis; Rx: Ref ENT for sinus congestion Labs: CBC and CMP 03/11/2023: WBC 5.8, Plt 172, Hgb 13, Eos 0; CMP CO2 31, but no kidney  marker elevation or liver marker elevation CT Chest 03/04/2022 independently reviewed, agree with read:   PMH/Meds/All/SocHx/FamHx/ROS:   Past Medical History:  Diagnosis Date   Adenomatous colon polyp    Chronic respiratory failure (HCC)    Chronic rhinitis    COPD with emphysema (HCC)    Depression 2006   some anxiety recurs when she has stopped med in past   Deviated nasal septum    Hyperlipidemia    OA (osteoarthritis)    B THUMB, RIGHT > LEFT HIP, BACK   Osteopenia      Past Surgical History:  Procedure Laterality Date   BREAST ENHANCEMENT SURGERY  1978   implants placed '78, removed and replaced  approx '95 (Dr. Dub Amis)   COLONOSCOPY  08/2011   COSMETIC SURGERY     LIPOSUCTION; other facial cosmetic surgery   GANGLION CYST EXCISION     INGUINIAL HERNIA REPAIR  2004   right, and ganglion cyst removal   LIPOMA EXCISION     MELANOMA EXCISION  2013   R upper arm   NASAL SEPTUM SURGERY  1987   for deviated septum (Dr. Dub Amis)   TONSILLECTOMY AND ADENOIDECTOMY  1951   VIDEO BRONCHOSCOPY Bilateral 08/08/2015   Procedure: VIDEO BRONCHOSCOPY WITHOUT FLUORO;  Surgeon: Lupita Leash, MD;  Location: [facility] ENDOSCOPY;  Service: Cardiopulmonary;  Laterality: Bilateral;    Family History  Problem Relation Age of Onset   Cancer Mother        bladder, metastatic   Alcohol abuse Father    Heart disease Father        CHF   Hyperlipidemia Father    Hypertension Father    Aortic aneurysm Sister  Crohn's disease Sister    Hyperlipidemia Sister    Cancer Sister        brain cancer in her 50's   Diabetes Maternal Uncle    Cancer Maternal Grandmother 37       pancreatic   Cancer Maternal Grandfather        bladder cancer in 58's   Stroke Paternal Grandmother    Heart disease Paternal Grandfather        MI later 4's, early 64's     Social Connections: Not on file      Current Outpatient Medications:    albuterol (VENTOLIN HFA) 108 (90 Base) MCG/ACT inhaler, TAKE  2 PUFFS BY MOUTH EVERY 6 HOURS AS NEEDED FOR WHEEZE OR SHORTNESS OF BREATH, Disp: 18 each, Rfl: 5   albuterol (VENTOLIN HFA) 108 (90 Base) MCG/ACT inhaler, Inhale 2 puffs into the lungs every 6 (six) hours as needed for wheezing or shortness of breath., Disp: 18 g, Rfl: 0   ALPRAZolam (XANAX) 0.5 MG tablet, Take 1 tablet (0.5 mg total) by mouth at bedtime as needed for anxiety., Disp: 60 tablet, Rfl: 1   atorvastatin (LIPITOR) 10 MG tablet, Take 1 tablet (10 mg total) by mouth daily., Disp: 90 tablet, Rfl: 3   azelastine (ASTELIN) 0.1 % nasal spray, Place 2 sprays into both nostrils 2 (two) times daily. Use in each nostril as directed, Disp: 30 mL, Rfl: 12   Budeson-Glycopyrrol-Formoterol (BREZTRI AEROSPHERE) 160-9-4.8 MCG/ACT AERO, Inhale 2 puffs into the lungs in the morning and at bedtime., Disp: 10.7 g, Rfl: 6   busPIRone (BUSPAR) 7.5 MG tablet, TAKE 1 TABLET BY MOUTH 2 TIMES DAILY., Disp: 180 tablet, Rfl: 3   Calcium Carbonate-Vitamin D (CALCIUM-VITAMIN D) 500-200 MG-UNIT tablet, Take 1 tablet by mouth daily., Disp: , Rfl:    citalopram (CELEXA) 10 MG tablet, TAKE 1 TABLET BY MOUTH EVERY DAY, Disp: 90 tablet, Rfl: 3   clindamycin (CLINDAGEL) 1 % gel, Apply topically 2 (two) times daily., Disp: 30 g, Rfl: 5   Guaifenesin 1200 MG TB12, Take 1,200 mg by mouth 2 (two) times daily. , Disp: , Rfl:    ibandronate (BONIVA) 150 MG tablet, TAKE 1 TAB BY MOUTH MONTHLY W/FULL GLASS OF WATER ON EMPTY STOMACH. NO FOOD/DRINK/LYING DOWN X30 MIN, Disp: 3 tablet, Rfl: 3   montelukast (SINGULAIR) 10 MG tablet, TAKE 1 TABLET BY MOUTH EVERYDAY AT BEDTIME, Disp: 90 tablet, Rfl: 1   OXYGEN, Inhale 2 L into the lungs., Disp: , Rfl:    sodium chloride HYPERTONIC 3 % nebulizer solution, USE 1 VIAL ( ) VIA NEBULIZER TWICE DAILY AS NEEDED., Disp: 360 mL, Rfl: 11   Spacer/Aero-Holding Chambers DEVI, 1 Dose by Does not apply route 2 (two) times daily., Disp: 1 Dose, Rfl: 0   tretinoin (RETIN-A) 0.05 % cream, Apply  topically at bedtime., Disp: 45 g, Rfl: 0   fluticasone (FLONASE) 50 MCG/ACT nasal spray, Place 2 sprays into both nostrils daily., Disp: 16 g, Rfl: 5   Physical Exam:   BP 137/78 (BP Location: Left Arm, Patient Position: Sitting, Cuff Size: Normal)   Pulse 81   LMP 06/10/1992   SpO2 99% Comment: On 2L O2  Salient findings:  CN II-XII intact  Bilateral EAC clear and TM intact with well pneumatized middle ear spaces Anterior rhinoscopy: Septum dev left caudally, but nasal airway fairly patent; bilateral inferior turbinates reduced; wearing breathe right strips, clear ULC collapse and tip support weak. Nasal endoscopy was indicated to better evaluate the nose and  paranasal sinuses, given the patient's history and exam findings, and is detailed below. No lesions of oral cavity/oropharynx No obviously palpable neck masses No respiratory distress or stridor; on O2 Weak tip support, mod cottle posiive   Seprately Identifiable Procedures:  PROCEDURE: Bilateral Diagnostic Rigid Nasal Endoscopy Pre-procedure diagnosis: nasal obstruction, concern for structural cause Post-procedure diagnosis: same Indication: See pre-procedure diagnosis and physical exam above Complications: None apparent EBL: 0 mL Anesthesia: Lidocaine 4% and topical decongestant was topically sprayed in each nasal cavity  Description of Procedure:  Patient was identified. A rigid 30 degree endoscope was utilized to evaluate the sinonasal cavities, mucosa, sinus ostia and turbinates and septum.  Overall, signs of mucosal inflammation are mild and noted.  No mucopurulence, polyps, or masses noted.   Right Middle meatus: clear Right SE Recess: clear Left MM: clear Left SE Recess: clear Photodocumentation was obtained.  CPT CODE -- 40981 - Mod 25   Impression & Plans:  Irene Collings is a 78 y.o. female with Resp failure on O2 now with:   1. Nasal obstruction   2. Nasal septal deviation   3. Nasal valve collapse   4.  Nasal dryness   5. Rhinitis medicamentosa    Multiple issues that are contributing to obstruction - NV collapse, slight septal deviation, rhinitis medicamenthosa and nasal dryness. We discussed options - understandably she does not want surgical intervention and I do not think given medical comorbidities she is an optimal candidate regardless - Would rec medical mgmt: can quit afrin; continue breathe right strips - Start flonase BID and astelin BID - Use ponaris oil and ayr gel for humidification - Consider sleep study -- if she has OSA, maybe doing nasal CPAP to stent open nasal airway might provide her relief at night time from breathing standpoint/obstruction standpoint - f/u PRN  See below regarding exact medications prescribed this encounter including dosages and route: Meds ordered this encounter  Medications   azelastine (ASTELIN) 0.1 % nasal spray    Sig: Place 2 sprays into both nostrils 2 (two) times daily. Use in each nostril as directed    Dispense:  30 mL    Refill:  12      Thank you for allowing me the opportunity to care for your patient. Please do not hesitate to contact me should you have any other questions.  Sincerely, Jovita Kussmaul, MD Otolaryngologist (ENT), [facility] Phone: [phone] Fax: [phone]  08/03/2023, 3:36 PM   MDM:  Level 4 - 99204 Complexity/Problems addressed: mod - multiple chronic problems Data complexity: mod - independent review of notes, labs, tests - Morbidity: mod   - Prescription Drug prescribed or managed: yes

## 2023-08-05 ENCOUNTER — Encounter: Payer: Self-pay | Admitting: Internal Medicine

## 2023-08-08 ENCOUNTER — Other Ambulatory Visit: Payer: Self-pay | Admitting: Medical

## 2023-08-08 NOTE — Telephone Encounter (Signed)
 Last apt 03/11/23

## 2023-09-14 ENCOUNTER — Other Ambulatory Visit: Payer: Self-pay | Admitting: Family Medicine

## 2023-09-15 NOTE — Telephone Encounter (Signed)
 Last apt 07/24/23

## 2023-10-05 ENCOUNTER — Other Ambulatory Visit: Payer: Self-pay | Admitting: Family Medicine

## 2023-10-06 NOTE — Telephone Encounter (Signed)
 Is this okay to refill?

## 2023-10-21 ENCOUNTER — Other Ambulatory Visit: Payer: Self-pay | Admitting: Family Medicine

## 2023-11-04 ENCOUNTER — Ambulatory Visit: Payer: Self-pay

## 2023-11-04 NOTE — Telephone Encounter (Signed)
  Chief Complaint: sinus congestion, chest congestion, cough Frequency: 2-3 days Pertinent Negatives: Patient denies fever, SOB,  Disposition: [] ED /[] Urgent Care (no appt availability in office) / [x] Appointment(In office/virtual)/ []  [facility]/ [] Home Care/ [] Refused Recommended Disposition /[] [facility]/ []  Follow-up with PCP Additional Notes: Pt states that she believes she has a sinus infection that is now impacting her lungs. Pt states that she cannot get around very well, would like a VV. Scheduled tomorrow. Pt states that mucus is yellow/green.  Copied from CRM [id number]. Topic: Clinical - Red Word Triage >> Nov 04, 2023 11:34 AM Chuck Crater wrote: Red Word that prompted transfer to Nurse Triage: Patient has a sinus infection and she is having difficulty breathing with thick yellowish/green mucous when blowing nose. She is requesting medication. Reason for Disposition  [1] Using nasal washes and pain medicine > 24 hours AND [2] sinus pain (around cheekbone or eye) persists  Answer Assessment - Initial Assessment Questions 1. LOCATION: "Where does it hurt?"      Pain in the chest, and nose 2. ONSET: "When did the sinus pain start?"  (e.g., hours, days)      2-3 3. SEVERITY: "How bad is the pain?"   (Scale 1-10; mild, moderate or severe)   - MILD (1-3): doesn't interfere with normal activities    - MODERATE (4-7): interferes with normal activities (e.g., work or school) or awakens from sleep   - SEVERE (8-10): excruciating pain and patient unable to do any normal activities        mild 4. RECURRENT SYMPTOM: "Have you ever had sinus problems before?" If Yes, ask: "When was the last time?" and "What happened that time?"      Yes hx  5. NASAL CONGESTION: "Is the nose blocked?" If Yes, ask: "Can you open it or must you breathe through your mouth?"     Yes, deviated septum, and mucus is yellow/green 6. NASAL DISCHARGE: "Do you have discharge from your nose?" If so  ask, "What color?"     Yellow/green 7. FEVER: "Do you have a fever?" If Yes, ask: "What is it, how was it measured, and when did it start?"      denies 8. OTHER SYMPTOMS: "Do you have any other symptoms?" (e.g., sore throat, cough, earache, difficulty breathing)     Cough,  Protocols used: Sinus Pain or Congestion-A-AH

## 2023-11-05 ENCOUNTER — Telehealth: Admitting: Family Medicine

## 2023-11-05 ENCOUNTER — Encounter: Payer: Self-pay | Admitting: Family Medicine

## 2023-11-05 VITALS — BP 151/77 | HR 87 | Ht 68.0 in

## 2023-11-05 DIAGNOSIS — J441 Chronic obstructive pulmonary disease with (acute) exacerbation: Secondary | ICD-10-CM

## 2023-11-05 MED ORDER — PREDNISONE 10 MG (21) PO TBPK
ORAL_TABLET | ORAL | 0 refills | Status: AC
Start: 2023-11-05 — End: ?

## 2023-11-05 MED ORDER — DOXYCYCLINE HYCLATE 100 MG PO TABS
100.0000 mg | ORAL_TABLET | Freq: Two times a day (BID) | ORAL | 0 refills | Status: AC
Start: 2023-11-05 — End: ?

## 2023-11-05 NOTE — Patient Instructions (Addendum)
 Stay well hydrated.  This helps keep the mucus and phlegm thin. Continue to use the guaifenesin  1200 mg twice daily. Continue your regular inhalers, and the albuterol  inhaler/nebulizer as needed.  Take the doxycycline  antibiotic twice daily. Take the prednisone  (steroid) with food, as directed (6 day tapering course). Take it with food.  I recommend taking a home COVID test.  If it is negative, continue the plan as above. If it is POSITIVE, we should treat you with paxlovid .You can hold off on taking the antibiotic (doxycycilne), but still take the prednisone  course, along with the paxlovid .  Send us  a MyChart message with your COVID test results.  Follow-up with your pulmonary doctor if symptoms persist or worsen, or in our office, for an in-person exam.

## 2023-11-05 NOTE — Progress Notes (Signed)
 Start time: 2:01 End time:  2:22  Virtual Visit via Video Note  I connected with Courtney Ayers on 11/05/23 by a video enabled telemedicine application and verified that I am speaking with the correct person using two identifiers.  Location: Patient: home Provider: office   I discussed the limitations of evaluation and management by telemedicine and the availability of in person appointments. The patient expressed understanding and agreed to proceed.  History of Present Illness:  Chief Complaint  Patient presents with   Acute Visit    Cough and nasal congestion. Is coughing up mucus, which is yellowish in color and very thick also having a hard time blowing the mucus out of her nose. Going on for 3 days, hasn't been around anyone sick that she is aware of. Hasn't taken an at home covid test, believes to be up-to-date on covid vac. Has been using a pulse ox O2-93 and PR- 102-109 (which is high she said). She is taking her BP at home. Hasn't been taking anything over the counter, used saline nasal spray and neti pot   3-4 days ago she started coughing up thick mucus/phlegm, and blowing her nose more. This morning she coughed up thick yellow (yellow-ish white). Nasal drainage is also thick and yellowish.  She has COPD, on oxygen .  Breathing has been more "challenging"--needing albuterol  more often, getting winded despite this just walking to the kitchen.    No fever, chills, n/v.  Has had some diarrhea/cramping in the last few days. No change to diet. No sick contacts.   PMH, PSH, SH reviewed  COPD, allergies, anxiety, aortic atherosclerosis  Outpatient Encounter Medications as of 11/05/2023  Medication Sig Note   albuterol  (VENTOLIN  HFA) 108 (90 Base) MCG/ACT inhaler TAKE 2 PUFFS BY MOUTH EVERY 6 HOURS AS NEEDED FOR WHEEZE OR SHORTNESS OF BREATH    atorvastatin  (LIPITOR) 10 MG tablet Take 1 tablet (10 mg total) by mouth daily.    azelastine  (ASTELIN ) 0.1 % nasal spray Place 2  sprays into both nostrils 2 (two) times daily. Use in each nostril as directed    Budeson-Glycopyrrol-Formoterol  (BREZTRI  AEROSPHERE) 160-9-4.8 MCG/ACT AERO Inhale 2 puffs into the lungs in the morning and at bedtime.    busPIRone  (BUSPAR ) 7.5 MG tablet TAKE 1 TABLET BY MOUTH 2 TIMES DAILY.    citalopram  (CELEXA ) 10 MG tablet TAKE 1 TABLET BY MOUTH EVERY DAY    Guaifenesin  1200 MG TB12 Take 1,200 mg by mouth 2 (two) times daily.     ibandronate  (BONIVA ) 150 MG tablet TAKE 1 TAB BY MOUTH MONTHLY W/FULL GLASS OF WATER ON EMPTY STOMACH. NO FOOD/DRINK/LYING DOWN X30 MIN    montelukast  (SINGULAIR ) 10 MG tablet TAKE 1 TABLET BY MOUTH EVERYDAY AT BEDTIME    OXYGEN  Inhale 2 L into the lungs.    sodium chloride  HYPERTONIC 3 % nebulizer solution USE 1 VIAL ( ) VIA NEBULIZER TWICE DAILY AS NEEDED.    Spacer/Aero-Holding Chambers DEVI 1 Dose by Does not apply route 2 (two) times daily.    tretinoin  (RETIN-A ) 0.05 % cream Apply topically at bedtime.    albuterol  (VENTOLIN  HFA) 108 (90 Base) MCG/ACT inhaler TAKE 2 PUFFS BY MOUTH EVERY 6 HOURS AS NEEDED FOR WHEEZE OR SHORTNESS OF BREATH    ALPRAZolam  (XANAX ) 0.5 MG tablet Take 1 tablet (0.5 mg total) by mouth at bedtime as needed for anxiety. (Patient not taking: Reported on 11/05/2023) 11/05/2023: PRN    Calcium  Carbonate-Vitamin D  (CALCIUM -VITAMIN D ) 500-200 MG-UNIT tablet Take 1 tablet by  mouth daily. (Patient not taking: Reported on 11/05/2023) 11/05/2023: About once a month   clindamycin  (CLINDAGEL) 1 % gel Apply topically 2 (two) times daily. (Patient not taking: Reported on 11/05/2023)    fluticasone  (FLONASE ) 50 MCG/ACT nasal spray Place 2 sprays into both nostrils daily.    No facility-administered encounter medications on file as of 11/05/2023.   No Known Allergies  ROS:  no fever, chills.   URI symptoms and worsening dyspnea per HPI. No nausea, vomiting. +diarrhea and abdominal cramping in the last few days. No melena or hematochezia. No CP, rash or  other symptoms.   Observations/Objective:  BP (!) 151/77   Pulse 87   Ht 5\' 8"  (1.727 m)   LMP 06/10/1992   SpO2 93%   BMI 24.02 kg/m   Wearing oxygen . Speaking comfortable, but some intermittent pursed lip breathing. She is alert and oriented.  Cranial nerves grossly intact. Exam is limited due to the virtual nature of the visit.   Assessment and Plan:   COPD exacerbation (HCC) - risks/SE of steroids reviewed. To take ABX if COVID test negative. If +, to contact us  for paxlovid . Supportive measures reviewed. f/u in office if not better - Plan: doxycycline  (VIBRA -TABS) 100 MG tablet, predniSONE  (STERAPRED UNI-PAK 21 TAB) 10 MG (21) TBPK tablet   Follow Up Instructions:    I discussed the assessment and treatment plan with the patient. The patient was provided an opportunity to ask questions and all were answered. The patient agreed with the plan and demonstrated an understanding of the instructions.   The patient was advised to call back or seek an in-person evaluation if the symptoms worsen or if the condition fails to improve as anticipated.  I spent 24 minutes dedicated to the care of this patient, including pre-visit review of records, face to face time, post-visit ordering of testing and documentation.    Paulett Boros, MD

## 2023-12-11 ENCOUNTER — Encounter: Payer: Self-pay | Admitting: Cardiovascular Disease

## 2023-12-11 ENCOUNTER — Telehealth: Payer: Self-pay | Admitting: Cardiovascular Disease

## 2023-12-11 NOTE — Telephone Encounter (Signed)
  We have attempted to contact patient 3 times from recall with no response. Sent Recall expungement letter via MyChart to patient and deleted recall

## 2024-02-22 ENCOUNTER — Other Ambulatory Visit: Payer: Self-pay | Admitting: Pulmonary Disease

## 2024-02-22 DIAGNOSIS — J449 Chronic obstructive pulmonary disease, unspecified: Secondary | ICD-10-CM

## 2024-03-03 ENCOUNTER — Other Ambulatory Visit: Payer: Self-pay | Admitting: Family Medicine

## 2024-03-03 DIAGNOSIS — I700 Atherosclerosis of aorta: Secondary | ICD-10-CM

## 2024-03-16 ENCOUNTER — Ambulatory Visit: Payer: Medicare Other | Admitting: Family Medicine

## 2024-03-16 ENCOUNTER — Encounter: Payer: Self-pay | Admitting: Family Medicine

## 2024-03-16 VITALS — BP 118/68 | HR 74

## 2024-03-16 DIAGNOSIS — R0981 Nasal congestion: Secondary | ICD-10-CM

## 2024-03-16 DIAGNOSIS — I10 Essential (primary) hypertension: Secondary | ICD-10-CM | POA: Diagnosis not present

## 2024-03-16 DIAGNOSIS — I700 Atherosclerosis of aorta: Secondary | ICD-10-CM

## 2024-03-16 DIAGNOSIS — F411 Generalized anxiety disorder: Secondary | ICD-10-CM | POA: Diagnosis not present

## 2024-03-16 DIAGNOSIS — H9192 Unspecified hearing loss, left ear: Secondary | ICD-10-CM

## 2024-03-16 DIAGNOSIS — Z23 Encounter for immunization: Secondary | ICD-10-CM

## 2024-03-16 DIAGNOSIS — Z0000 Encounter for general adult medical examination without abnormal findings: Secondary | ICD-10-CM | POA: Diagnosis not present

## 2024-03-16 LAB — LIPID PANEL

## 2024-03-16 MED ORDER — MONTELUKAST SODIUM 10 MG PO TABS
ORAL_TABLET | ORAL | 1 refills | Status: DC
Start: 2024-03-16 — End: 2024-04-12

## 2024-03-16 MED ORDER — BUSPIRONE HCL 7.5 MG PO TABS
7.5000 mg | ORAL_TABLET | Freq: Two times a day (BID) | ORAL | 3 refills | Status: AC
Start: 2024-03-16 — End: ?

## 2024-03-16 MED ORDER — ATORVASTATIN CALCIUM 10 MG PO TABS
10.0000 mg | ORAL_TABLET | Freq: Every day | ORAL | 1 refills | Status: AC
Start: 2024-03-16 — End: ?

## 2024-03-16 MED ORDER — ALPRAZOLAM 0.5 MG PO TABS
0.5000 mg | ORAL_TABLET | Freq: Every evening | ORAL | 1 refills | Status: AC | PRN
Start: 2024-03-16 — End: ?

## 2024-03-16 MED ORDER — CITALOPRAM HYDROBROMIDE 10 MG PO TABS
10.0000 mg | ORAL_TABLET | Freq: Every day | ORAL | 3 refills | Status: AC
Start: 2024-03-16 — End: ?

## 2024-03-16 NOTE — Progress Notes (Signed)
 Name: Courtney Ayers   Date of Visit: 03/16/24   Date of last visit with me: Visit date not found   CHIEF COMPLAINT:  Chief Complaint  Patient presents with   Medicare Wellness    Med check plus AWV       HPI:  Discussed the use of AI scribe software for clinical note transcription with the patient, who gave verbal consent to proceed.  History of Present Illness   Courtney Ayers is a 78 year old female with COPD who presents with hearing issues in her left ear. She is accompanied by her sister, Courtney Ayers.  She has been experiencing a sensation of fullness and diminished hearing in her left ear for about a week. The symptoms fluctuate and sometimes improve with certain head positions. No associated pain is reported.  She has a history of sinus issues and suspects that forceful nose blowing might have contributed to her current ear symptoms. She has not been using Flonase  recently, as she was prescribed Astelin  nasal spray by her ENT, which she uses twice daily.  Her current medications include Breztri  for COPD, Astelin  nasal spray taken twice daily, BuSpar , Xanax  as needed, Lipitor, and Singulair . She has been without Singulair  for two days.  She has a history of septoplasty and facial surgery, and she mentions having a narrow nasal passage. She is attentive to her medication schedule and overall health, although she humorously notes her evening bourbon consumption as a vice.         OBJECTIVE:       03/16/2024    1:56 PM  Depression screen PHQ 2/9  Decreased Interest 0  Down, Depressed, Hopeless 0  PHQ - 2 Score 0     BP Readings from Last 3 Encounters:  03/16/24 118/68  11/05/23 (!) 151/77  07/24/23 137/78    BP 118/68   Pulse 74   LMP 06/10/1992   SpO2 99%    Physical Exam   HEENT: Fluid behind left ear, not infected. No cerumen in ears.      Physical Exam Constitutional:      Appearance: Normal appearance.  Neurological:     General: No focal  deficit present.     Mental Status: She is alert and oriented to person, place, and time. Mental status is at baseline.     ASSESSMENT/PLAN:   Assessment & Plan Anxiety state  Medicare annual wellness visit, subsequent  Sinus congestion  Hearing difficulty of left ear  Atherosclerosis of aorta  Primary hypertension  Flu vaccine need    Assessment and Plan    Eustachian tube dysfunction with serous otitis media, left ear and chronic sinus congestion Intermittent left ear hearing issues due to sinus congestion causing pressure changes. - Start Flonase  nasal spray, one spray each nostril twice a day. - Continue Astelin  nasal spray, two sprays twice a day. - Refilled Singulair    Chronic obstructive pulmonary disease (COPD) Well-managed with Breztri  inhaler. - Continue Breztri  inhaler.  Generalized anxiety disorder Xanax  available for infrequent use. - Refill BuSpar  prescription. - Refill Xanax  prescription for as-needed use.  Adult Wellness Visit Routine visit. Up to date on care gaps and health maintenance. - Perform basic labs as part of routine annual check-up.  -- Full history and exam completed today in addition to [facility] Wellness Visit. Reviewed interval concerns, chronic conditions, and preventive care needs. Physical exam performed. Counseling provided on lifestyle, screenings, vaccines, and routine health maintenance.  - Flu and covid vaccine today  Courtney Ayers A. Vita MD [facility]

## 2024-03-17 ENCOUNTER — Ambulatory Visit: Payer: Self-pay | Admitting: Family Medicine

## 2024-03-17 LAB — CBC WITH DIFFERENTIAL/PLATELET
Basophils Absolute: 0 x10E3/uL (ref 0.0–0.2)
Basos: 0 %
EOS (ABSOLUTE): 0 x10E3/uL (ref 0.0–0.4)
Eos: 0 %
Hematocrit: 39.7 % (ref 34.0–46.6)
Hemoglobin: 12.9 g/dL (ref 11.1–15.9)
Immature Grans (Abs): 0 x10E3/uL (ref 0.0–0.1)
Immature Granulocytes: 0 %
Lymphocytes Absolute: 1.1 x10E3/uL (ref 0.7–3.1)
Lymphs: 16 %
MCH: 31.1 pg (ref 26.6–33.0)
MCHC: 32.5 g/dL (ref 31.5–35.7)
MCV: 96 fL (ref 79–97)
Monocytes Absolute: 0.4 x10E3/uL (ref 0.1–0.9)
Monocytes: 6 %
Neutrophils Absolute: 5 x10E3/uL (ref 1.4–7.0)
Neutrophils: 78 %
Platelets: 176 x10E3/uL (ref 150–450)
RBC: 4.15 x10E6/uL (ref 3.77–5.28)
RDW: 12.3 % (ref 11.7–15.4)
WBC: 6.4 x10E3/uL (ref 3.4–10.8)

## 2024-03-17 LAB — COMPREHENSIVE METABOLIC PANEL WITH GFR
ALT: 18 IU/L (ref 0–32)
AST: 26 IU/L (ref 0–40)
Albumin: 4.3 g/dL (ref 3.8–4.8)
Alkaline Phosphatase: 92 IU/L (ref 49–135)
BUN/Creatinine Ratio: 18 (ref 12–28)
BUN: 11 mg/dL (ref 8–27)
Bilirubin Total: 0.5 mg/dL (ref 0.0–1.2)
CO2: 29 mmol/L (ref 20–29)
Calcium: 8.6 mg/dL — AB (ref 8.7–10.3)
Chloride: 102 mmol/L (ref 96–106)
Creatinine, Ser: 0.61 mg/dL (ref 0.57–1.00)
Globulin, Total: 1.6 g/dL (ref 1.5–4.5)
Glucose: 102 mg/dL — AB (ref 70–99)
Potassium: 4.7 mmol/L (ref 3.5–5.2)
Sodium: 142 mmol/L (ref 134–144)
Total Protein: 5.9 g/dL — AB (ref 6.0–8.5)
eGFR: 91 mL/min/1.73 (ref >59)

## 2024-03-17 LAB — LIPID PANEL
Cholesterol, Total: 130 mg/dL (ref 100–199)
HDL: 87 mg/dL (ref >39)
LDL CALC COMMENT:: 1.5 ratio (ref 0.0–4.4)
LDL Chol Calc (NIH): 27 mg/dL (ref 0–99)
Triglycerides: 81 mg/dL (ref 0–149)
VLDL Cholesterol Cal: 16 mg/dL (ref 5–40)

## 2024-03-18 NOTE — Progress Notes (Signed)
 Called Pt and went over results.

## 2024-03-29 ENCOUNTER — Other Ambulatory Visit: Payer: Self-pay | Admitting: Pulmonary Disease

## 2024-03-29 DIAGNOSIS — J449 Chronic obstructive pulmonary disease, unspecified: Secondary | ICD-10-CM

## 2024-03-29 NOTE — Telephone Encounter (Unsigned)
 Copied from CRM [id number]. Topic: Clinical - Medication Refill >> Mar 29, 2024 12:58 PM Joesph PARAS wrote: Medication: budesonide -glycopyrrolate-formoterol  (BREZTRI  AEROSPHERE) 160-9-4.8 MCG/ACT AERO inhaler  Has the patient contacted their pharmacy? Yes - Auto-denied and needs to contact us  to refill  This is the patient's preferred pharmacy:  [id number] - [place], [place] - [address] AT [address] OF [address] [place] [address] Phone: [phone] Fax: [phone] Is this the correct pharmacy for this prescription? Yes If no, delete pharmacy and type the correct one.   Has the prescription been filled recently? No  Is the patient out of the medication? Yes - OUT NOW  Has the patient been seen for an appointment in the last year OR does the patient have an upcoming appointment? No - Scheduling an appointment to be seen but does need a refill to get to appointment.   Can we respond through MyChart? No  Agent: Please be advised that Rx refills may take up to 3 business days. We ask that you follow-up with your pharmacy.

## 2024-04-09 NOTE — Chronic Care Management (AMB) (Signed)
 A user error has taken place: encounter opened in error, closed for administrative reasons.

## 2024-04-12 ENCOUNTER — Ambulatory Visit: Admitting: Adult Health

## 2024-04-12 ENCOUNTER — Encounter: Payer: Self-pay | Admitting: Adult Health

## 2024-04-12 ENCOUNTER — Ambulatory Visit

## 2024-04-12 VITALS — BP 132/78 | HR 88 | Temp 98.2°F | Ht 67.5 in | Wt 160.8 lb

## 2024-04-12 DIAGNOSIS — R2240 Localized swelling, mass and lump, unspecified lower limb: Secondary | ICD-10-CM

## 2024-04-12 DIAGNOSIS — Z87891 Personal history of nicotine dependence: Secondary | ICD-10-CM

## 2024-04-12 DIAGNOSIS — I872 Venous insufficiency (chronic) (peripheral): Secondary | ICD-10-CM

## 2024-04-12 DIAGNOSIS — J9611 Chronic respiratory failure with hypoxia: Secondary | ICD-10-CM

## 2024-04-12 DIAGNOSIS — J449 Chronic obstructive pulmonary disease, unspecified: Secondary | ICD-10-CM | POA: Diagnosis not present

## 2024-04-12 DIAGNOSIS — H1132 Conjunctival hemorrhage, left eye: Secondary | ICD-10-CM

## 2024-04-12 DIAGNOSIS — J9610 Chronic respiratory failure, unspecified whether with hypoxia or hypercapnia: Secondary | ICD-10-CM

## 2024-04-12 DIAGNOSIS — R0981 Nasal congestion: Secondary | ICD-10-CM

## 2024-04-12 MED ORDER — ALBUTEROL SULFATE HFA 108 (90 BASE) MCG/ACT IN AERS: 1.0000 | INHALATION_SPRAY | Freq: Four times a day (QID) | RESPIRATORY_TRACT | 5 refills | Status: AC | PRN

## 2024-04-12 MED ORDER — BREZTRI AEROSPHERE 160-9-4.8 MCG/ACT IN AERO: 2.0000 | INHALATION_SPRAY | Freq: Two times a day (BID) | RESPIRATORY_TRACT | 5 refills | Status: AC

## 2024-04-12 MED ORDER — ALBUTEROL SULFATE (2.5 MG/3ML) 0.083% IN NEBU: 2.5000 mg | INHALATION_SOLUTION | Freq: Four times a day (QID) | RESPIRATORY_TRACT | Status: AC | PRN

## 2024-04-12 MED ORDER — MONTELUKAST SODIUM 10 MG PO TABS: ORAL_TABLET | ORAL | 5 refills | Status: AC

## 2024-04-12 NOTE — Progress Notes (Signed)
 @Patient  ID: Courtney Ayers, female    DOB: 18-Apr-1946, 78 y.o.   MRN: [id number]  Chief Complaint  Patient presents with   Medical Management of Chronic Issues    Referring provider: Vita Morrow, MD  HPI: 78 yo female former smoker followed for COPD and O2 RF     TEST/EVENTS : Reviewed 04/12/2024  Discussed the use of AI scribe software for clinical note transcription with the patient, who gave verbal consent to proceed.  History of Present Illness Courtney Ayers is a 78 year old female with COPD who presents for a routine checkup. She is accompanied by her sister, [name].  She has a history of COPD and is currently on oxygen  therapy at a rate of two liters. She uses Breztri  twice daily and a nebulizer with albuterol  as needed. She also takes Singulair  (montelukast ) regularly.  Her breathing varies, with 'good days and bad days.' She experiences difficulty coughing up phlegm, which is somewhat alleviated by using the nebulizer, though the improvement is not dramatic. No hemoptysis is reported.   Performs breathing exercises with a Flutter valve and an incentive spirometer, which she finds beneficial. Her mornings are generally better, allowing her to clear her lungs and engage in daily activities such as feeding her pets and preparing meals. However, she struggles more with breathing in the evenings.  She has had a recent flu shot and COVID vaccination, and her pneumonia vaccination is up to date. She reports a recent episode of broken capillaries in her left eye, which was initially dramatic but has now improved. There have been no changes in her vision, and she has an eye exam scheduled for December.     No Known Allergies  Immunization History  Administered Date(s) Administered   Fluad Quad(high Dose 65+) 02/25/2019, 02/28/2020, 04/11/2021, 04/04/2022   Fluad Trivalent(High Dose 65+) 03/11/2023   INFLUENZA, HIGH DOSE SEASONAL PF 03/18/2013, 03/24/2014, 03/08/2015,  03/27/2018, 03/16/2024   Influenza Split 07/03/2011, 02/14/2012   Influenza Whole 05/16/2004   Influenza,inj,Quad PF,6+ Mos 03/21/2016, 05/07/2017   Influenza-Unspecified 03/27/2018   PFIZER(Purple Top)SARS-COV-2 Vaccination 08/03/2019, 08/24/2019, 02/21/2020   PPD Test 05/30/1999   Pfizer Covid-19 Vaccine Bivalent Booster 73yrs & up 02/28/2021   Pfizer(Comirnaty)Fall Seasonal Vaccine 12 years and older 03/11/2023, 03/16/2024   Pneumococcal Conjugate-13 03/24/2014, 02/12/2019   Pneumococcal Polysaccharide-23 08/06/2000, 01/30/2011   Td 09/23/2001   Tdap 01/30/2011, 08/16/2021   Zoster Recombinant(Shingrix) 12/20/2016, 03/27/2018, 03/27/2018   Zoster, Live 03/05/2011    Past Medical History:  Diagnosis Date   Adenomatous colon polyp    Allergy    seasonal   Anxiety    can be hyper   Cancer (HCC)    skin on upper right arm surgically removed   Chronic respiratory failure (HCC)    Chronic rhinitis    COPD with emphysema (HCC)    Depression 2006   some anxiety recurs when she has stopped med in past   Deviated nasal septum    Emphysema of lung (HCC)    Hyperlipidemia    OA (osteoarthritis)    B THUMB, RIGHT > LEFT HIP, BACK   Osteopenia    Oxygen  deficiency    24/7, 2 liters    Tobacco History: Social History   Tobacco Use  Smoking Status Former   Current packs/day: 0.00   Average packs/day: 1 pack/day for 45.0 years (45.0 ttl pk-yrs)   Types: Cigarettes   Start date: 08/05/1969   Quit date: 08/05/2014   Years since quitting: 9.6  Smokeless Tobacco Never   Counseling given: Not Answered   Outpatient Medications Prior to Visit  Medication Sig Dispense Refill   albuterol  (VENTOLIN  HFA) 108 (90 Base) MCG/ACT inhaler TAKE 2 PUFFS BY MOUTH EVERY 6 HOURS AS NEEDED FOR WHEEZE OR SHORTNESS OF BREATH 18 each 5   albuterol  (VENTOLIN  HFA) 108 (90 Base) MCG/ACT inhaler TAKE 2 PUFFS BY MOUTH EVERY 6 HOURS AS NEEDED FOR WHEEZE OR SHORTNESS OF BREATH 6.7 each 1   ALPRAZolam   (XANAX ) 0.5 MG tablet Take 1 tablet (0.5 mg total) by mouth at bedtime as needed for anxiety. 60 tablet 1   atorvastatin  (LIPITOR) 10 MG tablet Take 1 tablet (10 mg total) by mouth daily. 90 tablet 1   azelastine  (ASTELIN ) 0.1 % nasal spray Place 2 sprays into both nostrils 2 (two) times daily. Use in each nostril as directed 30 mL 12   BREZTRI  AEROSPHERE 160-9-4.8 MCG/ACT AERO inhaler INHALE 2 PUFFS INTO THE LUNGS IN THE MORNING AND AT BEDTIME. 10.7 each 0   busPIRone  (BUSPAR ) 7.5 MG tablet Take 1 tablet (7.5 mg total) by mouth 2 (two) times daily. 180 tablet 3   Calcium  Carbonate-Vitamin D  (CALCIUM -VITAMIN D ) 500-200 MG-UNIT tablet Take 1 tablet by mouth daily.     citalopram  (CELEXA ) 10 MG tablet Take 1 tablet (10 mg total) by mouth daily. 90 tablet 3   clindamycin  (CLINDAGEL) 1 % gel Apply topically 2 (two) times daily. 30 g 5   doxycycline  (VIBRA -TABS) 100 MG tablet Take 1 tablet (100 mg total) by mouth 2 (two) times daily. 20 tablet 0   fluticasone  (FLONASE ) 50 MCG/ACT nasal spray Place 2 sprays into both nostrils daily. 16 g 5   Guaifenesin  1200 MG TB12 Take 1,200 mg by mouth 2 (two) times daily.      ibandronate  (BONIVA ) 150 MG tablet TAKE 1 TAB BY MOUTH MONTHLY W/FULL GLASS OF WATER ON EMPTY STOMACH. NO FOOD/DRINK/LYING DOWN X30 MIN 3 tablet 3   montelukast  (SINGULAIR ) 10 MG tablet TAKE 1 TABLET BY MOUTH EVERYDAY AT BEDTIME 90 tablet 1   OXYGEN  Inhale 2 L into the lungs.     predniSONE  (STERAPRED UNI-PAK 21 TAB) 10 MG (21) TBPK tablet Take with food, as directed 21 tablet 0   sodium chloride  HYPERTONIC 3 % nebulizer solution USE 1 VIAL ( ) VIA NEBULIZER TWICE DAILY AS NEEDED. 360 mL 11   tretinoin  (RETIN-A ) 0.05 % cream Apply topically at bedtime. 45 g 0   Spacer/Aero-Holding Chambers DEVI 1 Dose by Does not apply route 2 (two) times daily. (Patient not taking: Reported on 04/12/2024) 1 Dose 0   No facility-administered medications prior to visit.     Review of Systems:    Constitutional:   No  weight loss, night sweats,  Fevers, chills,+ fatigue, or  lassitude.  HEENT:   No headaches,  Difficulty swallowing,  Tooth/dental problems, or  Sore throat,                No sneezing, itching, ear ache, nasal congestion, post nasal drip,   CV:  No chest pain,  Orthopnea, PND, swelling in lower extremities, anasarca, dizziness, palpitations, syncope.   GI  No heartburn, indigestion, abdominal pain, nausea, vomiting, diarrhea, change in bowel habits, loss of appetite, bloody stools.   Resp: .  No chest wall deformity  Skin: no rash or lesions.  GU: no dysuria, change in color of urine, no urgency or frequency.  No flank pain, no hematuria   MS:  No joint pain or  swelling.  No decreased range of motion.  No back pain.    Physical Exam  BP 132/78   Pulse 88   Temp 98.2 F (36.8 C) (Oral)   Ht 5' 7.5 (1.715 m) Comment: per patient  Wt 160 lb 12.8 oz (72.9 kg)   LMP 06/10/1992   SpO2 92% Comment: on 2L of 02  BMI 24.81 kg/m   GEN: A/Ox3; pleasant , NAD, elderly, chronically wheelchair, oxygen    HEENT:  [place]/AT,  EACs-clear, TMs-wnl, NOSE-clear, THROAT-clear, no lesions, no postnasal drip or exudate noted.  Left eye with conjunctival redness  NECK:  Supple w/ fair ROM; no JVD; normal carotid impulses w/o bruits; no thyromegaly or nodules palpated; no lymphadenopathy.    RESP diminished breath sounds in the bases no accessory muscle use, no dullness to percussion  CARD:  RRR, no m/r/g, tr-1 peripheral edema, pulses intact, no cyanosis or clubbing.  GI:   Soft & nt; nml bowel sounds; no organomegaly or masses detected.   Musco: Warm bil, no deformities or joint swelling noted.   Neuro: alert, no focal deficits noted.    Skin: Warm, no lesions or rashes    Lab Results:Reviewed 04/12/2024   CBC    Component Value Date/Time   WBC 6.4 03/16/2024 1441   WBC 9.3 09/03/2018 0440   RBC 4.15 03/16/2024 1441   RBC 3.46 (L) 09/03/2018 0440   HGB 12.9  03/16/2024 1441   HCT 39.7 03/16/2024 1441   PLT 176 03/16/2024 1441   MCV 96 03/16/2024 1441   MCH 31.1 03/16/2024 1441   MCH 30.3 09/03/2018 0440   MCHC 32.5 03/16/2024 1441   MCHC 29.7 (L) 09/03/2018 0440   RDW 12.3 03/16/2024 1441   LYMPHSABS 1.1 03/16/2024 1441   MONOABS 0.6 09/02/2018 0508   EOSABS 0.0 03/16/2024 1441   BASOSABS 0.0 03/16/2024 1441    BMET    Component Value Date/Time   NA 142 03/16/2024 1441   K 4.7 03/16/2024 1441   CL 102 03/16/2024 1441   CO2 29 03/16/2024 1441   GLUCOSE 102 (H) 03/16/2024 1441   GLUCOSE 134 (H) 09/03/2018 0440   BUN 11 03/16/2024 1441   CREATININE 0.61 03/16/2024 1441   CREATININE 0.66 07/01/2016 1404   CALCIUM  8.6 (L) 03/16/2024 1441   GFRNONAA 75 02/28/2020 1507   GFRAA 87 02/28/2020 1507    BNP    Component Value Date/Time   BNP 111.7 (H) 08/11/2018 1729    ProBNP No results found for: PROBNP  Imaging: No results found.  Administration History     None          Latest Ref Rng & Units 10/07/2014    1:03 PM  PFT Results  FVC-Pre L 2.36   FVC-Predicted Pre % 67   FVC-Post L 2.51   FVC-Predicted Post % 72   Pre FEV1/FVC % % 33   Post FEV1/FCV % % 33   FEV1-Pre L 0.77   FEV1-Predicted Pre % 29   FEV1-Post L 0.82   DLCO uncorrected ml/min/mmHg 7.55   DLCO UNC% % 25   DLVA Predicted % 26   TLC L 7.07   TLC % Predicted % 125   RV % Predicted % 180     No results found for: NITRICOXIDE      No data to display              Assessment & Plan:   Assessment and Plan Assessment & Plan Chronic obstructive pulmonary disease (COPD)  -  appears stable patient has significant symptom burden at baseline. COPD presents with daily symptoms and oxygen  dependency. She uses Breztri  twice daily and albuterol  as needed, experiencing variable breathing difficulties, especially in the evenings. She continues using an incentive spirometer and Flutter valve for airway clearance. , Continue Breztri  two puffs  twice daily and use albuterol  nebulizer as needed. Order a chest x-ray to assess current lung status. Monitor oxygen  levels at home with a pulse oximeter. Encourage continued use of the incentive spirometer and Flutter valve. Send refills for Breztri , Singulair , and albuterol  inhaler to [facility].  Subconjunctival hemorrhage, left eye   The left eye has a subconjunctival hemorrhage, likely from increased pressure due to coughing or minor trauma. No visual changes are reported. Advise follow-up with an eye exam in December. Monitor for any visual changes and seek earlier evaluation if changes occur.  Leg swelling   Chronic leg swelling is noted with no increase reported. Monitor for any changes.  Chronic respiratory failure appears stable on oxygen  continue on oxygen  to maintain O2 saturations greater than 88 to 90%.  Plan  Patient Instructions  Continue on Breztri  2 puffs Twice daily   Albuterol  inhaler or neb As needed   Continue on Oxygen  2l/m  Chest xray today  Activity as tolerated  Follow up in 6 months and As needed          Madelin Stank, NP 04/12/2024

## 2024-04-12 NOTE — Patient Instructions (Addendum)
 Continue on Breztri  2 puffs Twice daily   Albuterol  inhaler or neb As needed   Continue on Oxygen  2l/m  Chest xray today  Activity as tolerated  Follow up in 6 months and As needed

## 2024-04-15 ENCOUNTER — Ambulatory Visit: Payer: Self-pay | Admitting: Adult Health

## 2024-06-16 ENCOUNTER — Other Ambulatory Visit: Payer: Self-pay | Admitting: Family Medicine

## 2024-06-16 NOTE — Telephone Encounter (Signed)
" °  Already refilled by pulmonology "

## 2024-06-25 ENCOUNTER — Telehealth: Payer: Self-pay | Admitting: Family Medicine

## 2024-06-25 NOTE — Telephone Encounter (Signed)
 Copied from CRM #[id number]. Topic: General - Other >> Jun 25, 2024  2:43 PM Joesph NOVAK wrote: Reason for CRM: patient needs to renew her certification for her parking sticker. She would like to get two since her sister drives her. Please advise. [phone].  Spoke to pt and she will bring re certification form by

## 2024-06-28 ENCOUNTER — Telehealth: Payer: Self-pay | Admitting: Family Medicine

## 2024-06-28 NOTE — Telephone Encounter (Signed)
 Patients sister dropped of a parking placard form to be completed and mailed back to the patient. Was placed in Dr.Jha's folder.

## 2024-07-08 ENCOUNTER — Other Ambulatory Visit (INDEPENDENT_AMBULATORY_CARE_PROVIDER_SITE_OTHER): Payer: Self-pay | Admitting: Otolaryngology

## 2024-07-15 ENCOUNTER — Telehealth (INDEPENDENT_AMBULATORY_CARE_PROVIDER_SITE_OTHER): Payer: Self-pay

## 2024-07-15 NOTE — Telephone Encounter (Signed)
 Per Dr. Tobie I called in nasal spray to patients pharmacy with 10 refills.

## 2024-07-15 NOTE — Telephone Encounter (Signed)
 Patient called in and left a voicemail stating that she needs Dr. Tobie to send her in a refill on her medication please advise

## 2024-09-14 ENCOUNTER — Ambulatory Visit: Payer: Self-pay | Admitting: Family Medicine
